# Patient Record
Sex: Female | Born: 1995
Health system: Southern US, Community
[De-identification: ages and names within clinical notes are randomized; demographics above are authoritative.]

## PROBLEM LIST (undated history)

## (undated) DIAGNOSIS — F32A Depression, unspecified: Secondary | ICD-10-CM

## (undated) DIAGNOSIS — E781 Pure hyperglyceridemia: Secondary | ICD-10-CM

## (undated) DIAGNOSIS — F419 Anxiety disorder, unspecified: Secondary | ICD-10-CM

## (undated) DIAGNOSIS — F3181 Bipolar II disorder: Secondary | ICD-10-CM

## (undated) DIAGNOSIS — N83519 Torsion of ovary and ovarian pedicle, unspecified side: Secondary | ICD-10-CM

## (undated) DIAGNOSIS — N946 Dysmenorrhea, unspecified: Secondary | ICD-10-CM

## (undated) DIAGNOSIS — F329 Major depressive disorder, single episode, unspecified: Secondary | ICD-10-CM

## (undated) HISTORY — DX: Depression, unspecified: F32.A

## (undated) HISTORY — DX: Torsion of ovary and ovarian pedicle, unspecified side: N83.519

## (undated) HISTORY — DX: Anxiety disorder, unspecified: F41.9

## (undated) HISTORY — DX: Major depressive disorder, single episode, unspecified: F32.9

## (undated) HISTORY — DX: Dysmenorrhea, unspecified: N94.6

## (undated) HISTORY — DX: Pure hyperglyceridemia: E78.1

---
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
## 2015-03-11 ENCOUNTER — Other Ambulatory Visit: Payer: Self-pay | Admitting: Family Medicine

## 2015-03-20 ENCOUNTER — Other Ambulatory Visit: Payer: Self-pay | Admitting: Family Medicine

## 2015-03-22 NOTE — Telephone Encounter (Signed)
Mrs Donelan calls and said there is no refill for sprintec at [place]. Spoke with Cyril Mourning at [address] and rx ready for pick up since 03/11/15. Ms Antwine voiced understanding.

## 2015-04-04 ENCOUNTER — Telehealth: Payer: Self-pay

## 2015-04-04 NOTE — Telephone Encounter (Signed)
Pts father request immunization record for college; spoke with pt (no DPR signed) under media tab is [place] immunization record but do not see where immunization record gives any immunizations given at [place] does not have listing of immunizations; I asked pt had she had immunizations since living in [place]: pt handed phone to her mother; pts mother has immunization record at home and also can get one thru pts school. Mrs Aerts will cb if needed.

## 2015-04-11 ENCOUNTER — Other Ambulatory Visit: Payer: Self-pay | Admitting: Family Medicine

## 2015-04-15 ENCOUNTER — Other Ambulatory Visit: Payer: Self-pay | Admitting: Family Medicine

## 2015-06-07 ENCOUNTER — Other Ambulatory Visit: Payer: Self-pay | Admitting: Family Medicine

## 2015-06-20 ENCOUNTER — Ambulatory Visit: Payer: Self-pay | Admitting: Family Medicine

## 2015-06-27 ENCOUNTER — Ambulatory Visit (INDEPENDENT_AMBULATORY_CARE_PROVIDER_SITE_OTHER): Payer: 59 | Admitting: Family Medicine

## 2015-06-27 ENCOUNTER — Encounter: Payer: Self-pay | Admitting: Family Medicine

## 2015-06-27 VITALS — BP 104/70 | HR 75 | Temp 98.3°F | Ht 64.5 in | Wt 145.0 lb

## 2015-06-27 DIAGNOSIS — F411 Generalized anxiety disorder: Secondary | ICD-10-CM | POA: Diagnosis not present

## 2015-06-27 DIAGNOSIS — N921 Excessive and frequent menstruation with irregular cycle: Secondary | ICD-10-CM

## 2015-06-27 MED ORDER — SERTRALINE HCL 25 MG PO TABS: 25.0000 mg | ORAL_TABLET | Freq: Every day | ORAL | Status: DC

## 2015-06-27 MED ORDER — NORGESTIMATE-ETH ESTRADIOL 0.25-35 MG-MCG PO TABS: ORAL_TABLET | ORAL | Status: DC

## 2015-06-27 NOTE — Patient Instructions (Signed)
Great to see you. We are starting Zoloft 25 mg daily. Please call me in a few weeks with an update.  Please stop by to see Rosaria Ferries on your way out.

## 2015-06-27 NOTE — Assessment & Plan Note (Signed)
Stable on current OCP. eRx refill sent for 1 year.

## 2015-06-27 NOTE — Assessment & Plan Note (Signed)
>  25 minutes spent in face to face time with patient, >50% spent in counselling or coordination of care Victoria Simmons and her mother declined a psychiatry referral but did agree to a trial of another SSRI along with referral for psychotherapy. eRx sent for zoloft 25 mg daily and refer to [facility]older for psychotherapy. Follow up in 1 month.

## 2015-06-27 NOTE — Progress Notes (Signed)
Pre visit review using our clinic review tool, if applicable. No additional management support is needed unless otherwise documented below in the visit note. 

## 2015-06-27 NOTE — Progress Notes (Signed)
Subjective:   Patient ID: Victoria Simmons, female    DOB: 09/11/1996, 19 y.o.   MRN: [id number]  Victoria Simmons is a pleasant 19 y.o. year old female who presents to clinic today with her mom to discuss Anxiety  on 06/27/2015  HPI:  Anxiety- was on prozac 10 mg daily a couple of years ago. Stopped taking it because it was ineffective and never returned for follow up.  Since then, her mom states that her anxiety has worsened.  She cant drive due to panic attacks.  Has several panic attacks at school or even when talking on the phone.  Denies feeling afraid to leave her house.  Denies feeling depressed.  Mom had similar severe anxiety at her age.  She let Victoria Simmons have one of her xanax during a panic attack but it was ineffective.  Tiffanny usually just has to "let them pass."  Deep breaths not helpful either.  Sometimes feels like she will pass out during a panic attack but never does.  Sleeping ok.  Appetite ok. Wt Readings from Last 3 Encounters:  06/27/15 145 lb (65.772 kg) (78 %*, Z = 0.77)  09/21/14 140 lb (63.504 kg) (75 %*, Z = 0.68)  01/25/14 133 lb 12 oz (60.669 kg) (69 %*, Z = 0.50)   * Growth percentiles are based on CDC 2-20 Years data.   Please with current OCPs.  Virginal.  Periods are now regular and light. No current outpatient prescriptions on file prior to visit.   No current facility-administered medications on file prior to visit.    No Known Allergies  Past Medical History  Diagnosis Date  . Ovarian torsion 2008    Past Surgical History  Procedure Laterality Date  . Ovary surgery      No family history on file.  Social History   Social History  . Marital Status: Single    Spouse Name: N/A  . Number of Children: N/A  . Years of Education: N/A   Occupational History  . Not on file.   Social History Main Topics  . Smoking status: Never Smoker   . Smokeless tobacco: Never Used  . Alcohol Use: No  . Drug Use: No  . Sexual Activity: No   Other  Topics Concern  . Not on file   Social History Narrative   ** Merged History Encounter **       The PMH, PSH, Social History, Family History, Medications, and allergies have been reviewed in [facility], and have been updated if relevant.   Review of Systems  Psychiatric/Behavioral: Negative for suicidal ideas, hallucinations, behavioral problems, confusion, sleep disturbance, self-injury, dysphoric mood, decreased concentration and agitation. The patient is nervous/anxious. The patient is not hyperactive.   All other systems reviewed and are negative.      Objective:    BP 104/70 mmHg  Pulse 75  Temp(Src) 98.3 F (36.8 C) (Oral)  Ht 5' 4.5" (1.638 m)  Wt 145 lb (65.772 kg)  BMI 24.51 kg/m2  SpO2 98%  LMP 06/07/2015   Physical Exam  Constitutional: She is oriented to person, place, and time. She appears well-developed and well-nourished.  HENT:  Head: Normocephalic.  Eyes: Conjunctivae are normal.  Cardiovascular: Normal rate.   Pulmonary/Chest: Effort normal.  Neurological: She is alert and oriented to person, place, and time. No cranial nerve deficit.  Psychiatric: Judgment and thought content normal.  Anxious and tearful when discussing her panic attacks otherwise appropriate  Nursing note and vitals reviewed.  Assessment & Plan:   Anxiety state - Plan: Ambulatory referral to Psychology No Follow-up on file.

## 2015-07-03 ENCOUNTER — Other Ambulatory Visit: Payer: Self-pay | Admitting: Family Medicine

## 2015-07-28 ENCOUNTER — Ambulatory Visit: Payer: 59 | Admitting: Psychology

## 2015-08-18 ENCOUNTER — Ambulatory Visit (INDEPENDENT_AMBULATORY_CARE_PROVIDER_SITE_OTHER): Payer: 59 | Admitting: Psychology

## 2015-08-18 DIAGNOSIS — F411 Generalized anxiety disorder: Secondary | ICD-10-CM

## 2015-09-05 ENCOUNTER — Ambulatory Visit (INDEPENDENT_AMBULATORY_CARE_PROVIDER_SITE_OTHER): Payer: 59 | Admitting: Psychology

## 2015-09-05 DIAGNOSIS — F411 Generalized anxiety disorder: Secondary | ICD-10-CM

## 2015-09-26 ENCOUNTER — Other Ambulatory Visit: Payer: Self-pay

## 2015-09-26 NOTE — Telephone Encounter (Signed)
Dawn pts mom said wants zoloft sent to mail order pharmacy electronically. Pts mom does not know name of mail order pharmacy. Pt seen 06/27/15 and pts mom was to call back with update on pts condition; pts mom forgot to cb. Pt is doing better on zoloft and her attitude is much better. pts mom is to cb with name of mail order pharmacy.

## 2015-09-29 ENCOUNTER — Ambulatory Visit: Payer: 59 | Admitting: Psychology

## 2015-10-24 ENCOUNTER — Other Ambulatory Visit: Payer: Self-pay | Admitting: *Deleted

## 2015-10-24 MED ORDER — SERTRALINE HCL 25 MG PO TABS: 25.0000 mg | ORAL_TABLET | Freq: Every day | ORAL | Status: DC

## 2015-10-28 ENCOUNTER — Other Ambulatory Visit: Payer: Self-pay

## 2015-10-28 MED ORDER — SERTRALINE HCL 25 MG PO TABS: 25.0000 mg | ORAL_TABLET | Freq: Every day | ORAL | Status: DC

## 2015-10-28 NOTE — Telephone Encounter (Signed)
Hilliard Clark pts father (DPR signed) said that ins is requiring all maintenance meds to go to [facility]. Request printed [facility]ft rx for [facility] since does not have acct set up with mail order pharmacy. Call when ready for pick up. Since zoloft is given for dx of anxiety request sent to Dr Deborra Medina.

## 2015-10-28 NOTE — Telephone Encounter (Signed)
Rx printed and placed in Dr Hulen Shouts inbox for signature, Monday

## 2015-10-28 NOTE — Addendum Note (Signed)
Addended by: Modena Nunnery on: 10/28/2015 04:40 PM   Modules accepted: Orders

## 2015-10-28 NOTE — Telephone Encounter (Signed)
Med was refilled on 10/24/15 to [facility].

## 2015-10-31 NOTE — Telephone Encounter (Signed)
Spoke to pt's mother and informed her Rx is available for pickup at the front desk

## 2015-11-04 NOTE — Telephone Encounter (Signed)
Mr Victoria Simmons called requesting zoloft sent to [facility]; acct has been set [facility] with [facility]. Mrs Victoria Simmons picked up rx and Mr Victoria Simmons thinks was for 30 day only; I advised him rx was # 31 x 2 refills; Mr Victoria Simmons wants refill sent to [facility] since acct set up. Advised would be glad to do that but would need zoloft rx returned to office and then refill can be sent electronically. He will speak with wife and get rx brought back to office.

## 2015-12-09 ENCOUNTER — Telehealth: Payer: Self-pay

## 2015-12-09 MED ORDER — SERTRALINE HCL 25 MG PO TABS: 25.0000 mg | ORAL_TABLET | Freq: Every day | ORAL | Status: DC

## 2015-12-09 NOTE — Addendum Note (Signed)
Addended by: Modena Nunnery on: 12/09/2015 03:14 PM   Modules accepted: Orders

## 2015-12-09 NOTE — Telephone Encounter (Signed)
pts mother came into office to bring Rx back. Rx accepted by front desk, and was sent electronically as she has set up an account with mail order pharmacy

## 2015-12-09 NOTE — Addendum Note (Signed)
Addended by: Modena Nunnery on: 12/09/2015 03:07 PM   Modules accepted: Orders

## 2015-12-09 NOTE — Telephone Encounter (Signed)
pts mother request short supply be sent to local pharmacy as the pt is completely out of medication and will have to await the arrival of mail order. Pt advised she will have to pay out of pocket, otherwise, she will be billed for mail order request

## 2015-12-27 NOTE — Telephone Encounter (Signed)
error 

## 2016-05-07 ENCOUNTER — Ambulatory Visit (INDEPENDENT_AMBULATORY_CARE_PROVIDER_SITE_OTHER): Payer: 59 | Admitting: Family Medicine

## 2016-05-07 ENCOUNTER — Encounter: Payer: Self-pay | Admitting: Family Medicine

## 2016-05-07 VITALS — BP 110/66 | HR 65 | Temp 98.1°F | Wt 146.0 lb

## 2016-05-07 DIAGNOSIS — F411 Generalized anxiety disorder: Secondary | ICD-10-CM | POA: Diagnosis not present

## 2016-05-07 MED ORDER — SERTRALINE HCL 50 MG PO TABS: 50.0000 mg | ORAL_TABLET | Freq: Every day | ORAL | 3 refills | Status: DC

## 2016-05-07 MED ORDER — NORGESTIMATE-ETH ESTRADIOL 0.25-35 MG-MCG PO TABS: ORAL_TABLET | ORAL | 11 refills | Status: DC

## 2016-05-07 NOTE — Patient Instructions (Signed)
Great to see you.  We are increasing your zoloft 50 mg daily.  Ok to take two of your 25 mg tablets at once until you run out.

## 2016-05-07 NOTE — Progress Notes (Signed)
   Subjective:   Patient ID: Victoria Simmons, female    DOB: July 01, 1996, 20 y.o.   MRN: [id number]  Takeria ORALIA MARQUES is a pleasant 20 y.o. year old female who presents to clinic today with her mom for Follow-up (zoloft ineffective)  on 05/07/2016  HPI:  Anxiety- feels zoloft is no longer effective.  Started zoloft 25 mg daily and referred her to [facility] for psychotherapy when I saw her on 06/27/15. Note reviewed. She did attend two episodes of psychotherapy but did not feel it was helpful.  Zoloft 25 mg was initially very effective. She could feel herself not getting anxious in situations that would typically cause anxiety.  A few months ago, she noticed more episodes of anxiety again despite being compliant with her zoloft.  No current outpatient prescriptions on file prior to visit.   No current facility-administered medications on file prior to visit.     No Known Allergies  Past Medical History:  Diagnosis Date  . Ovarian torsion 2008    Past Surgical History:  Procedure Laterality Date  . OVARY SURGERY      No family history on file.  Social History   Social History  . Marital status: Single    Spouse name: N/A  . Number of children: N/A  . Years of education: N/A   Occupational History  . Not on file.   Social History Main Topics  . Smoking status: Never Smoker  . Smokeless tobacco: Never Used  . Alcohol use No  . Drug use: No  . Sexual activity: No   Other Topics Concern  . Not on file   Social History Narrative   ** Merged History Encounter **       The PMH, PSH, Social History, Family History, Medications, and allergies have been reviewed in [facility], and have been updated if relevant.   Review of Systems  Constitutional: Negative.   Psychiatric/Behavioral: Negative for agitation, behavioral problems, confusion, decreased concentration, dysphoric mood, hallucinations, self-injury, sleep disturbance and suicidal ideas. The patient is  nervous/anxious. The patient is not hyperactive.   All other systems reviewed and are negative.      Objective:    BP 110/66   Pulse 65   Temp 98.1 F (36.7 C) (Oral)   Wt 146 lb (66.2 kg)   LMP 03/27/2016   SpO2 98%   BMI 24.67 kg/m    Physical Exam   General:  Well-developed,well-nourished,in no acute distress; alert,appropriate and cooperative throughout examination Head:  normocephalic and atraumatic.   Lungs:  Normal respiratory effort, chest expands symmetrically. Lungs are clear to auscultation, no crackles or wheezes. Heart:  Normal rate and regular rhythm. S1 and S2 normal without gallop, murmur, click, rub or other extra sounds. Extremities:  No clubbing, cyanosis, edema, or deformity noted with normal full range of motion of all joints.   Neurologic:  alert & oriented X3 and gait normal.   Skin:  Intact without suspicious lesions or rashes Psych:  Cognition and judgment appear intact. Alert and cooperative with normal attention span and concentration. No apparent delusions, illusions, hallucinations      Assessment & Plan:   Anxiety state No Follow-up on file.

## 2016-05-07 NOTE — Progress Notes (Signed)
Pre visit review using our clinic review tool, if applicable. No additional management support is needed unless otherwise documented below in the visit note. 

## 2016-05-07 NOTE — Assessment & Plan Note (Signed)
>  25 minutes spent in face to face time with patient, >50% spent in counselling or coordination of care Deteriorated. Tolerated zoloft well and felt it was initially effective. Increase dose of zoloft to 50 mg daily. The patient indicates understanding of these issues and agrees with the plan. Call or return to clinic prn if these symptoms worsen or fail to improve as anticipated.

## 2016-06-18 ENCOUNTER — Encounter: Payer: Self-pay | Admitting: Family Medicine

## 2016-06-18 ENCOUNTER — Ambulatory Visit (INDEPENDENT_AMBULATORY_CARE_PROVIDER_SITE_OTHER): Payer: 59 | Admitting: Family Medicine

## 2016-06-18 ENCOUNTER — Telehealth: Payer: Self-pay | Admitting: Family Medicine

## 2016-06-18 VITALS — BP 110/62 | HR 59 | Temp 98.4°F | Wt 144.8 lb

## 2016-06-18 DIAGNOSIS — F411 Generalized anxiety disorder: Secondary | ICD-10-CM

## 2016-06-18 NOTE — Patient Instructions (Signed)
Great to see you. Please cut your zoloft in half- take 1/2 tablet daily (25 mg daily).  Please stop by to see Rosaria Ferries on your way out.

## 2016-06-18 NOTE — Progress Notes (Signed)
Pre visit review using our clinic review tool, if applicable. No additional management support is needed unless otherwise documented below in the visit note. 

## 2016-06-18 NOTE — Progress Notes (Signed)
Subjective:   Patient ID: Victoria Simmons, female    DOB: 04/03/1996, 20 y.o.   MRN: [id number]  Victoria Simmons is a pleasant 20 y.o. year old female who presents to clinic today with her mom for Follow-up (zoloft ineffective)  on 06/18/2016  HPI:  Anxiety- feels zoloft is no longer effective.  Started zoloft 25 mg daily and referred her to [facility] for psychotherapy when I saw her on 06/27/15. Note reviewed. She did attend two episodes of psychotherapy but did not feel it was helpful.  Zoloft 25 mg was initially very effective. She could feel herself not getting anxious in situations that would typically cause anxiety.  A few months ago, she noticed more episodes of anxiety again despite being compliant with her zoloft so we increased her zoloft to 50 mg daily. Feels anxiety is actually worse.  Current Outpatient Prescriptions on File Prior to Visit  Medication Sig Dispense Refill  . norgestimate-ethinyl estradiol (SPRINTEC 28) 0.25-35 MG-MCG tablet TAKE 1 TABLET BY MOUTH DAILY. 28 tablet 11  . sertraline (ZOLOFT) 50 MG tablet Take 1 tablet (50 mg total) by mouth daily. 90 tablet 3   No current facility-administered medications on file prior to visit.     No Known Allergies  Past Medical History:  Diagnosis Date  . Ovarian torsion 2008    Past Surgical History:  Procedure Laterality Date  . OVARY SURGERY      No family history on file.  Social History   Social History  . Marital status: Single    Spouse name: N/A  . Number of children: N/A  . Years of education: N/A   Occupational History  . Not on file.   Social History Main Topics  . Smoking status: Never Smoker  . Smokeless tobacco: Never Used  . Alcohol use No  . Drug use: No  . Sexual activity: No   Other Topics Concern  . Not on file   Social History Narrative   ** Merged History Encounter **       The PMH, PSH, Social History, Family History, Medications, and allergies have been reviewed  in [facility], and have been updated if relevant.   Review of Systems  Constitutional: Negative.   Psychiatric/Behavioral: Negative for agitation, behavioral problems, confusion, decreased concentration, dysphoric mood, hallucinations, self-injury, sleep disturbance and suicidal ideas. The patient is nervous/anxious. The patient is not hyperactive.   All other systems reviewed and are negative.      Objective:    BP 110/62   Pulse (!) 59   Temp 98.4 F (36.9 C) (Oral)   Wt 144 lb 12 oz (65.7 kg)   LMP 06/12/2016   SpO2 98%   BMI 24.46 kg/m    Physical Exam   General:  Well-developed,well-nourished,in no acute distress; alert,appropriate and cooperative throughout examination Head:  normocephalic and atraumatic.   Lungs:  Normal respiratory effort, chest expands symmetrically. Lungs are clear to auscultation, no crackles or wheezes. Heart:  Normal rate and regular rhythm. S1 and S2 normal without gallop, murmur, click, rub or other extra sounds. Extremities:  No clubbing, cyanosis, edema, or deformity noted with normal full range of motion of all joints.   Neurologic:  alert & oriented X3 and gait normal.   Skin:  Intact without suspicious lesions or rashes Psych:  Cognition and judgment appear intact. Alert and cooperative with normal attention span and concentration. No apparent delusions, illusions, hallucinations      Assessment & Plan:   Anxiety  state - Plan: Ambulatory referral to Psychiatry No Follow-up on file.

## 2016-06-18 NOTE — Assessment & Plan Note (Signed)
>  25 minutes spent in face to face time with patient, >50% spent in counselling or coordination of care Deteriorated. We agreed best to send to psychiatry for evaluation and medication management.  Decrease zoloft back to 25 mg daily

## 2016-06-18 NOTE — Telephone Encounter (Signed)
Met with pt and pt mother. They are aware [facility] will call them to schedule psychiatry appt.  Placed on [place]

## 2016-07-02 ENCOUNTER — Ambulatory Visit (INDEPENDENT_AMBULATORY_CARE_PROVIDER_SITE_OTHER): Payer: 59

## 2016-07-02 ENCOUNTER — Ambulatory Visit
Admission: EM | Admit: 2016-07-02 | Discharge: 2016-07-02 | Disposition: A | Discharge status: Home / Self Care | Payer: 59 | Attending: Family Medicine | Admitting: Family Medicine

## 2016-07-02 DIAGNOSIS — R197 Diarrhea, unspecified: Secondary | ICD-10-CM

## 2016-07-02 DIAGNOSIS — R1084 Generalized abdominal pain: Secondary | ICD-10-CM

## 2016-07-02 DIAGNOSIS — R112 Nausea with vomiting, unspecified: Secondary | ICD-10-CM

## 2016-07-02 DIAGNOSIS — R109 Unspecified abdominal pain: Secondary | ICD-10-CM | POA: Diagnosis not present

## 2016-07-02 LAB — URINALYSIS COMPLETE WITH MICROSCOPIC (ARMC ONLY)
Bilirubin Urine: NEGATIVE
Glucose, UA: NEGATIVE mg/dL
Hgb urine dipstick: NEGATIVE
Ketones, ur: NEGATIVE mg/dL
Leukocytes, UA: NEGATIVE
Nitrite: NEGATIVE
Protein, ur: NEGATIVE mg/dL
Specific Gravity, Urine: 1.015 (ref 1.005–1.030)
pH: 6.5 (ref 5.0–8.0)

## 2016-07-02 LAB — CBC WITH DIFFERENTIAL/PLATELET
Basophils Absolute: 0.1 10*3/uL (ref 0–0.1)
Basophils Relative: 1 %
Eosinophils Absolute: 0.1 10*3/uL (ref 0–0.7)
Eosinophils Relative: 2 %
HCT: 39.1 % (ref 35.0–47.0)
Hemoglobin: 13.3 g/dL (ref 12.0–16.0)
Lymphocytes Relative: 31 %
Lymphs Abs: 2.2 10*3/uL (ref 1.0–3.6)
MCH: 28.2 pg (ref 26.0–34.0)
MCHC: 34 g/dL (ref 32.0–36.0)
MCV: 82.8 fL (ref 80.0–100.0)
Monocytes Absolute: 0.4 10*3/uL (ref 0.2–0.9)
Monocytes Relative: 6 %
Neutro Abs: 4.1 10*3/uL (ref 1.4–6.5)
Neutrophils Relative %: 60 %
Platelets: 225 10*3/uL (ref 150–440)
RBC: 4.72 MIL/uL (ref 3.80–5.20)
RDW: 13 % (ref 11.5–14.5)
WBC: 6.9 10*3/uL (ref 3.6–11.0)

## 2016-07-02 LAB — PREGNANCY, URINE: Preg Test, Ur: NEGATIVE

## 2016-07-02 MED ORDER — ONDANSETRON 8 MG PO TBDP: 8.0000 mg | ORAL_TABLET | Freq: Two times a day (BID) | ORAL | 0 refills | Status: DC

## 2016-07-02 NOTE — ED Provider Notes (Signed)
CSN: [id number]     Arrival date & time 07/02/16  1115 History   First MD Initiated Contact with Patient 07/02/16 1249     Chief Complaint  Patient presents with  . Abdominal Pain   (Consider location/radiation/quality/duration/timing/severity/associated sxs/prior Treatment) HPI  This a 20 year old female who is accompanied by her grandmother and Complaining of abdominal pain and spasms that she's had over the last 2 weeks. She states that Friday it was worse she had 5 bowel movements of diarrhea without blood or mucus along with nausea and vomiting. The following day was very similar but on Sunday she states that she was better again. This morning she awoke with severe cramping again along with nausea but no vomiting and diarrhea that is characterized as watery. She has cramping pains that will last up to 2 minutes and then subside. Is mostly periumbilical but will radiate into her epigastric area. He knows that that dairy seems to upset her stomach but other foods are not bother. He has had some chills but has no fever today. States that 2 years ago she had severe constipation.      Past Medical History:  Diagnosis Date  . Ovarian torsion 2008   Past Surgical History:  Procedure Laterality Date  . OVARY SURGERY     Family History  Problem Relation Age of Onset  . Hepatitis B Mother   . Hypertension Father    Social History  Substance Use Topics  . Smoking status: Never Smoker  . Smokeless tobacco: Never Used  . Alcohol use No   OB History    Gravida Para Term Preterm AB Living   0 0 0 0 0     SAB TAB Ectopic Multiple Live Births   0 0 0         Review of Systems  Constitutional: Positive for activity change, appetite change and chills. Negative for fatigue and fever.  Gastrointestinal: Positive for abdominal pain, diarrhea, nausea and vomiting. Negative for abdominal distention, anal bleeding, blood in stool and constipation.  All other systems reviewed and are  negative.   Allergies  Review of patient's allergies indicates no known allergies.  Home Medications   Prior to Admission medications   Medication Sig Start Date End Date Taking? Authorizing Provider  norgestimate-ethinyl estradiol (SPRINTEC 28) 0.25-35 MG-MCG tablet TAKE 1 TABLET BY MOUTH DAILY. 05/07/16  Yes Lucille Passy, MD  sertraline (ZOLOFT) 50 MG tablet Take 1 tablet (50 mg total) by mouth daily. 05/07/16  Yes Lucille Passy, MD  ondansetron (ZOFRAN ODT) 8 MG disintegrating tablet Take 1 tablet (8 mg total) by mouth 2 (two) times daily. 07/02/16   Lorin Picket, PA-C   Meds Ordered and Administered this Visit  Medications - No data to display  BP 107/70 (BP Location: Left Arm)   Pulse 68   Temp 98.3 F (36.8 C) (Tympanic)   Resp 16   Ht 5\' 5"  (1.651 m)   Wt 148 lb (67.1 kg)   LMP 06/12/2016   SpO2 100%   BMI 24.63 kg/m  No data found.   Physical Exam  Constitutional: She is oriented to person, place, and time. She appears well-developed and well-nourished. No distress.  HENT:  Head: Normocephalic and atraumatic.  Eyes: EOM are normal. Pupils are equal, round, and reactive to light.  Neck: Normal range of motion. Neck supple.  Pulmonary/Chest: Effort normal and breath sounds normal. No respiratory distress. She has no wheezes. She has no rales.  Abdominal: Soft.  Bowel sounds are normal. She exhibits no distension and no mass. There is tenderness. There is no rebound and no guarding.  Abdominal exam was performed with Lenna Sciara, CMA as chaperone and assisted. Does not appear to be distended. Bowel sounds are present and active in all quadrants. She has tenderness in the periumbilical region mostly superior epigastric region but mostly of her pain is in the left upper to lower quadrant. There is no suprapubic tenderness present.  Musculoskeletal: Normal range of motion.  Neurological: She is alert and oriented to person, place, and time.  Skin: Skin is warm and dry. She is  not diaphoretic.  Psychiatric: She has a normal mood and affect. Her behavior is normal. Judgment and thought content normal.  Nursing note and vitals reviewed.   Urgent Care Course   Clinical Course    Procedures (including critical care time)  Labs Review Labs Reviewed  URINALYSIS COMPLETEWITH MICROSCOPIC ([facility] ONLY) - Abnormal; Notable for the following:       Result Value   Bacteria, UA RARE (*)    Squamous Epithelial / LPF 0-5 (*)    All other components within normal limits  CBC WITH DIFFERENTIAL/PLATELET  PREGNANCY, URINE  COMPREHENSIVE METABOLIC PANEL    Imaging Review Dg Abd 2 Views  Result Date: 07/02/2016 CLINICAL DATA:  Diffuse abdominal pain for 2 weeks. Intermittent constipation, diarrhea, and nausea and vomiting. EXAM: ABDOMEN - 2 VIEW COMPARISON:  Plain film of the abdomen dated 09/21/2014. FINDINGS: Bowel gas pattern is nonobstructive. No evidence of soft tissue mass or abnormal fluid collection. No evidence of free intraperitoneal air. No pathologic appearing calcifications. Osseous structures are unremarkable. Lung bases are clear IMPRESSION: Negative. Electronically Signed   By: Franki Cabot M.D.   On: 07/02/2016 14:01     Visual Acuity Review  Right Eye Distance:   Left Eye Distance:   Bilateral Distance:    Right Eye Near:   Left Eye Near:    Bilateral Near:         MDM   1. Generalized abdominal pain   2. Nausea vomiting and diarrhea    Discharge Medication List as of 07/02/2016  2:37 PM    START taking these medications   Details  ondansetron (ZOFRAN ODT) 8 MG disintegrating tablet Take 1 tablet (8 mg total) by mouth 2 (two) times daily., Starting Mon 07/02/2016, Normal      Plan: 1. Test/x-ray results and diagnosis reviewed with patient 2. rx as per orders; risks, benefits, potential side effects reviewed with patient 3. Recommend supportive treatment with adequate fluid intake. When she begins to eat again start with a brat diet  advance as tolerated. I have highly recommended that she be seen by gastroenterologist should probably return to her primary care physician for evaluation and referral. Is not him a surgical abdomen at the present time. Does not appear toxic. If she does develop a high fever or pain that does not abate to go immediately to the emergency department. 4. F/u prn if symptoms worsen or don't improve     Lorin Picket, PA-C 07/02/16 2141    Lorin Picket, PA-C 07/02/16 2143

## 2016-07-02 NOTE — ED Triage Notes (Signed)
Patient complains of abdominal pain and spasms over the last two weeks. Patient states that she worsened on Friday with no appetite, diarrhea, nausea. Patient describes her pain has a intermittent spasm that is generalized and today was worse when she woke up.

## 2016-07-03 ENCOUNTER — Telehealth: Payer: Self-pay

## 2016-07-03 DIAGNOSIS — R109 Unspecified abdominal pain: Secondary | ICD-10-CM

## 2016-07-03 LAB — COMPREHENSIVE METABOLIC PANEL
ALT: 14 U/L (ref 14–54)
AST: 19 U/L (ref 15–41)
Albumin: 4.2 g/dL (ref 3.5–5.0)
Alkaline Phosphatase: 45 U/L (ref 38–126)
Anion gap: 8 (ref 5–15)
BUN: 12 mg/dL (ref 6–20)
CO2: 21 mmol/L — ABNORMAL LOW (ref 22–32)
Calcium: 9.3 mg/dL (ref 8.9–10.3)
Chloride: 108 mmol/L (ref 101–111)
Creatinine, Ser: 0.72 mg/dL (ref 0.44–1.00)
GFR calc Af Amer: >60 mL/min (ref >60)
GFR calc non Af Amer: >60 mL/min (ref >60)
Glucose, Bld: 84 mg/dL (ref 65–99)
Potassium: 3.8 mmol/L (ref 3.5–5.1)
Sodium: 137 mmol/L (ref 135–145)
Total Bilirubin: 0.1 mg/dL — ABNORMAL LOW (ref 0.3–1.2)
Total Protein: 7.8 g/dL (ref 6.5–8.1)

## 2016-07-03 NOTE — Telephone Encounter (Signed)
Dawn,pts mom (DPR signed) left v/m; pt seen at [facility] on 07/02/16 with abd pain, N,V & D. Dawn request GI referral. The [facility] advised pt to see PCP on 07/05/16. Unable to reach [facility] by phone. Do you want pt to schedule f/u appt with Dr Deborra Medina or do referral to GI.

## 2016-07-03 NOTE — Telephone Encounter (Signed)
Referral placed.

## 2016-07-12 ENCOUNTER — Ambulatory Visit: Payer: Self-pay | Admitting: Licensed Clinical Social Worker

## 2016-07-12 ENCOUNTER — Ambulatory Visit (INDEPENDENT_AMBULATORY_CARE_PROVIDER_SITE_OTHER): Payer: 59 | Admitting: Physician Assistant

## 2016-07-12 ENCOUNTER — Encounter: Payer: Self-pay | Admitting: Physician Assistant

## 2016-07-12 VITALS — BP 100/60 | Ht 64.75 in | Wt 142.0 lb

## 2016-07-12 DIAGNOSIS — R1031 Right lower quadrant pain: Secondary | ICD-10-CM | POA: Diagnosis not present

## 2016-07-12 DIAGNOSIS — G8929 Other chronic pain: Secondary | ICD-10-CM

## 2016-07-12 DIAGNOSIS — R110 Nausea: Secondary | ICD-10-CM

## 2016-07-12 DIAGNOSIS — K5900 Constipation, unspecified: Secondary | ICD-10-CM

## 2016-07-12 DIAGNOSIS — R1032 Left lower quadrant pain: Secondary | ICD-10-CM

## 2016-07-12 DIAGNOSIS — R1013 Epigastric pain: Secondary | ICD-10-CM | POA: Diagnosis not present

## 2016-07-12 MED ORDER — ONDANSETRON HCL 4 MG PO TABS: 4.0000 mg | ORAL_TABLET | Freq: Four times a day (QID) | ORAL | 0 refills | Status: DC

## 2016-07-12 MED ORDER — HYOSCYAMINE SULFATE 0.125 MG SL SUBL: 0.1250 mg | SUBLINGUAL_TABLET | SUBLINGUAL | 0 refills | Status: DC | PRN

## 2016-07-12 NOTE — Progress Notes (Signed)
Chief Complaint: Abdominal pain, nausea, vomiting  HPI: Victoria Simmons is a 20 year old female with a past medical history of anxiety, who was referred to me by Lucille Passy, MD for a complaint of abdominal pain, nausea and vomiting.    Per chart review it appears patient was recently seen in the ED on 07/02/2016 for a complaint of abdominal pain and spasms over the last 2 weeks. She also describes some diarrhea along with nausea and vomiting. Patient had a urinalysis which was normal. Abdominal x-rays showed no etiology for the pain. She was given Zofran 8 mg twice a day and supportive treatment with adequate fluid intake was recommended. CMP and CBC completed 07/02/2016 were normal.     Today, the patient presents to clinic accompanied by her father. She tells me that she has had 3 years of abdominal pain on an almost daily basis. This pain is described as mostly periumbilical, but sometimes radiates into her epigastrium. It is cramping in nature. She has been unable to relate this pain to specific foods that she is eating or timing of the day. She does tell me she has had trouble with constipation in the past, to the point where she was vomiting and had to go to the ER for laxatives. Most recently 2-3 weeks ago, the patient did have a severely constipated episode where she was vomiting and her father gave her a laxative which helped her to have a bowel movement and she felt better for a few days. She then had recurrence of her nausea and started vomiting again with abdominal pain to the point where she "couldn't stand up" and she arrived in the ED as above. Patient tells me that she will have epigastric pain intermittently throughout the day. She denies any heartburn or reflux symptoms. She does remain somewhat nauseous and is using ondansetron 8 mg when necessary.  Patient also describes a bilateral lower abdominal cramping pain which sometimes awakens her at night. She will then proceed to the bathroom  and have diarrhea. This occurs 2-3 times per week. She tells me this is often the only time she will have a bowel movement and in between will have no stool at all. She does feel quite chilled frequently, but denies any fever. Associated symptoms included decrease diet during times of severe constipation. The patient denies ever being controlled on a daily laxative.  Patient's social history is positive for being a sophomore in college, she finds this very stressful and currently is weaning off of her Zoloft as this "didn't seem to help my anxiety". She does admit that she has a very "poor diet" she eats many fast, fatty and greasy foods.  Patient denies fever, melena, extreme weight loss, fatigue, change in medications, recent antibiotics, and heartburn, reflux or dysphagia.   Past Medical History:  Diagnosis Date  . Ovarian torsion 2008    Past Surgical History:  Procedure Laterality Date  . OVARY SURGERY      Current Outpatient Prescriptions  Medication Sig Dispense Refill  . norgestimate-ethinyl estradiol (SPRINTEC 28) 0.25-35 MG-MCG tablet TAKE 1 TABLET BY MOUTH DAILY. 28 tablet 11  . ondansetron (ZOFRAN ODT) 8 MG disintegrating tablet Take 1 tablet (8 mg total) by mouth 2 (two) times daily. 6 tablet 0  . sertraline (ZOLOFT) 50 MG tablet Take 1 tablet (50 mg total) by mouth daily. 90 tablet 3   No current facility-administered medications for this visit.     Allergies as of 07/12/2016  . (No  Known Allergies)    Family History  Problem Relation Age of Onset  . Hepatitis B Mother   . Hypertension Father     Social History   Social History  . Marital status: Single    Spouse name: N/A  . Number of children: N/A  . Years of education: N/A   Occupational History  . Not on file.   Social History Main Topics  . Smoking status: Never Smoker  . Smokeless tobacco: Never Used  . Alcohol use No  . Drug use: No  . Sexual activity: No   Other Topics Concern  . Not on  file   Social History Narrative   ** Merged History Encounter **        Review of Systems:     Constitutional: Positive for chills No weight loss, fever, weakness or fatigue HEENT: Eyes: No change in vision               Ears, Nose, Throat:  No change in hearing or congestion Skin: No rash or itching Cardiovascular: No chest pain, chest pressure or palpitations   Respiratory: No SOB or cough Gastrointestinal: See HPI and otherwise negative Genitourinary: No dysuria or change in urinary frequency Neurological: No headache, dizziness or syncope Musculoskeletal: No new muscle or joint pain Hematologic: No bleeding or bruising Psychiatric: Positive for anxiety   Physical Exam:  Vital signs: .BP 100/60   Ht 5' 4.75" (1.645 m)   Wt 142 lb (64.4 kg)   LMP 06/12/2016   BMI 23.81 kg/m    General:   Pleasant Caucasian female appears to be in NAD, Well developed, Well nourished, alert and cooperative Head:  Normocephalic and atraumatic. Eyes:   PEERL, EOMI. No icterus. Conjunctiva pink. Ears:  Normal auditory acuity. Neck:  Supple Throat: Oral cavity and pharynx without inflammation, swelling or lesion. Teeth in good condition. Lungs: Respirations even and unlabored. Lungs clear to auscultation bilaterally.   No wheezes, crackles, or rhonchi.  Heart: Normal S1, S2. No MRG. Regular rate and rhythm. No peripheral edema, cyanosis or pallor.  Abdomen:  Soft, nondistended, generalized TTP, worse in the epigastrium and left lower quadrant No rebound or guarding. Decreased bowel sounds. No appreciable masses or hepatomegaly. Rectal:  Not performed.  Msk:  Symmetrical without gross deformities. Extremities:  Without edema, no deformity or joint abnormality. Normal ROM. Neurologic:  Alert and  oriented x4;  grossly normal neurologically.   Skin:   Dry and intact without significant lesions or rashes. Psychiatric: Oriented to person, place and time. Demonstrates good judgement and reason  without abnormal affect or behaviors.  RELEVANT LABS AND IMAGING: CBC    Component Value Date/Time   WBC 6.9 07/02/2016 1318   RBC 4.72 07/02/2016 1318   HGB 13.3 07/02/2016 1318   HCT 39.1 07/02/2016 1318   PLT 225 07/02/2016 1318   MCV 82.8 07/02/2016 1318   MCH 28.2 07/02/2016 1318   MCHC 34.0 07/02/2016 1318   RDW 13.0 07/02/2016 1318   LYMPHSABS 2.2 07/02/2016 1318   MONOABS 0.4 07/02/2016 1318   EOSABS 0.1 07/02/2016 1318   BASOSABS 0.1 07/02/2016 1318    CMP     Component Value Date/Time   NA 137 07/02/2016 1318   K 3.8 07/02/2016 1318   CL 108 07/02/2016 1318   CO2 21 (L) 07/02/2016 1318   GLUCOSE 84 07/02/2016 1318   BUN 12 07/02/2016 1318   CREATININE 0.72 07/02/2016 1318   CALCIUM 9.3 07/02/2016 1318   PROT 7.8  07/02/2016 1318   ALBUMIN 4.2 07/02/2016 1318   AST 19 07/02/2016 1318   ALT 14 07/02/2016 1318   ALKPHOS 45 07/02/2016 1318   BILITOT 0.1 (L) 07/02/2016 1318   GFRNONAA >60 07/02/2016 1318   GFRAA >60 07/02/2016 1318   EXAM: ABDOMEN - 2 VIEW 07/02/16  COMPARISON:  Plain film of the abdomen dated 09/21/2014.  FINDINGS: Bowel gas pattern is nonobstructive. No evidence of soft tissue mass or abnormal fluid collection. No evidence of free intraperitoneal air. No pathologic appearing calcifications. Osseous structures are unremarkable. Lung bases are clear  IMPRESSION: Negative.   Electronically Signed   By: Franki Cabot M.D.   On: 07/02/2016 14:01  Assessment: 1. Epigastric pain: Patient describes that this occurs intermittently throughout the day, unrelated to eating, history of anxiety and a large amount of stress, no overt heartburn or reflux symptoms; consider functional dyspepsia versus gastritis versus H. pylori versus other 2. Nausea: Mainly during times of severe constipation, likely related 3. Constipation: Patient reports some bowel movement every 2-3 days, if goes longer than this will become nauseous and sometimes even  vomit; consider IBS vs diet related 4. Bilateral lower abdominal cramping: Typically relieved with a bowel movement, likely represents IBS  Plan: 1. At this time discussed with the patient and her father that we could try some conservative measures for the next 2-3 weeks to see if this changes her current symptoms. The patient does have a very poor diet and is typically constipated, which could result in all of her symptoms. 2. Recommend the patient maintain a high-fiber diet, 25-35 g per day with the use of fiber supplement such as Metamucil, Citrucel or Benefiber. 3. Recommend the patient start daily MiraLax dosing, discussed that she can titrate this as needed. She may use this up to 4 times a day if necessary. 4. Prescribed hyoscyamine sulfate 0.125 mg every 4-6 hours when necessary, discussed that she should only use this when experiencing abdominal cramping symptoms. 5. Patient will follow in clinic in 2-3 weeks with me, if no change in symptoms will order further testing to include imaging versus EGD/colon  Ellouise Newer, PA-C [facility] 07/12/2016, 12:58 PM  Cc: Lucille Passy, MD

## 2016-07-12 NOTE — Progress Notes (Signed)
Agree with assessment and plan as outlined, and that symptoms are likely functional. May want to start just miralax for constipation initially, as fiber supplement can cause bloating in some patients and worsen cramping. For epigastric pain I would check H pylori stool test and consideration for trial of PPI if symptoms persist.

## 2016-07-12 NOTE — Patient Instructions (Addendum)
We have sent Zofran and hyoscyamine to your pharmacy   Use Metamucil as needed  Your follow up appointment is scheduled on 08/06/2016 Monday at 2:45pm  High-Fiber Diet Fiber, also called dietary fiber, is a type of carbohydrate found in fruits, vegetables, whole grains, and beans. A high-fiber diet can have many health benefits. Your health care provider may recommend a high-fiber diet to help:  Prevent constipation. Fiber can make your bowel movements more regular.  Lower your cholesterol.  Relieve hemorrhoids, uncomplicated diverticulosis, or irritable bowel syndrome.  Prevent overeating as part of a weight-loss plan.  Prevent heart disease, type 2 diabetes, and certain cancers. WHAT IS MY PLAN? The recommended daily intake of fiber includes:  38 grams for men under age 79.  75 grams for men over age 59.  1 grams for women under age 64.  64 grams for women over age 69. You can get the recommended daily intake of dietary fiber by eating a variety of fruits, vegetables, grains, and beans. Your health care provider may also recommend a fiber supplement if it is not possible to get enough fiber through your diet. WHAT DO I NEED TO KNOW ABOUT A HIGH-FIBER DIET?  Fiber supplements have not been widely studied for their effectiveness, so it is better to get fiber through food sources.  Always check the fiber content on thenutrition facts label of any prepackaged food. Look for foods that contain at least 5 grams of fiber per serving.  Ask your dietitian if you have questions about specific foods that are related to your condition, especially if those foods are not listed in the following section.  Increase your daily fiber consumption gradually. Increasing your intake of dietary fiber too quickly may cause bloating, cramping, or gas.  Drink plenty of water. Water helps you to digest fiber. WHAT FOODS CAN I EAT? Grains Whole-grain breads. Multigrain cereal. Oats and oatmeal.  Brown rice. Barley. Bulgur wheat. [place]. Bran muffins. Popcorn. Rye wafer crackers. Vegetables Sweet potatoes. Spinach. Kale. Artichokes. Cabbage. Broccoli. Green peas. Carrots. Squash. Fruits Berries. Pears. Apples. Oranges. Avocados. Prunes and raisins. Dried figs. Meats and Other Protein Sources [place], kidney, pinto, and soy beans. Split peas. Lentils. Nuts and seeds. Dairy Fiber-fortified yogurt. Beverages Fiber-fortified soy milk. Fiber-fortified orange juice. Other Fiber bars. The items listed above may not be a complete list of recommended foods or beverages. Contact your dietitian for more options. WHAT FOODS ARE NOT RECOMMENDED? Grains White bread. Pasta made with refined flour. White rice. Vegetables Fried potatoes. Canned vegetables. Well-cooked vegetables.  Fruits Fruit juice. Cooked, strained fruit. Meats and Other Protein Sources Fatty cuts of meat. Fried Sales executive or fried fish. Dairy Milk. Yogurt. Cream cheese. Sour cream. Beverages Soft drinks. Other Cakes and pastries. Butter and oils. The items listed above may not be a complete list of foods and beverages to avoid. Contact your dietitian for more information. WHAT ARE SOME TIPS FOR INCLUDING HIGH-FIBER FOODS IN MY DIET?  Eat a wide variety of high-fiber foods.  Make sure that half of all grains consumed each day are whole grains.  Replace breads and cereals made from refined flour or white flour with whole-grain breads and cereals.  Replace white rice with brown rice, bulgur wheat, or millet.  Start the day with a breakfast that is high in fiber, such as a cereal that contains at least 5 grams of fiber per serving.  Use beans in place of meat in soups, salads, or pasta.  Eat high-fiber snacks, such as  berries, raw vegetables, nuts, or popcorn.   This information is not intended to replace advice given to you by your health care provider. Make sure you discuss any questions you have with your health care  provider.   Document Released: 09/24/2005 Document Revised: 10/15/2014 Document Reviewed: 03/09/2014 Elsevier Interactive Patient Education [employer].

## 2016-07-31 ENCOUNTER — Ambulatory Visit: Payer: Self-pay | Admitting: Licensed Clinical Social Worker

## 2016-08-06 ENCOUNTER — Encounter: Payer: Self-pay | Admitting: Physician Assistant

## 2016-08-06 ENCOUNTER — Ambulatory Visit (INDEPENDENT_AMBULATORY_CARE_PROVIDER_SITE_OTHER): Payer: 59 | Admitting: Physician Assistant

## 2016-08-06 VITALS — BP 98/56 | HR 68 | Ht 65.0 in | Wt 142.4 lb

## 2016-08-06 DIAGNOSIS — K5900 Constipation, unspecified: Secondary | ICD-10-CM

## 2016-08-06 DIAGNOSIS — R110 Nausea: Secondary | ICD-10-CM | POA: Diagnosis not present

## 2016-08-06 DIAGNOSIS — R1084 Generalized abdominal pain: Secondary | ICD-10-CM

## 2016-08-06 NOTE — Progress Notes (Signed)
Agree with assessment and plan as outlined. Glad to see she is feeling better.

## 2016-08-06 NOTE — Progress Notes (Signed)
Chief Complaint: Constipation  HPI:  Victoria Simmons is a 20 year old Caucasian female who was originally referred to me by Lucille Passy, MD for a complaint of abdominal pain, nausea and vomiting, who returns to clinic today for follow-up .  Please recall patient was initially seen on 07/12/2016 and described 3 years of abdominal pain on an almost daily basis associated with the periumbilical pain which radiated into her epigastrium as well as cramping. She also described episodes of nausea and vomiting, bilateral lower abdominal cramping and constipation so severe that she ended up in the ED. At time of last visit we discussed the patient's poor diet and how this was likely related to her constipation. She was started on a high fiber diet, 25-35 g per day as well as daily MiraLAX dosing. She was given hyoscyamine sulfate when necessary for abdominal cramping.  Today, the patient presents to clinic accompanied by her mother and tells me that she feels "all better". She tells me that she has "not felt this good in years". Currently, the patient is eating 3 fiber gummies a day and using one dose of MiraLAX daily. She has only used her Levsin once or twice for occasional gas related cramping pain and this has helped as well. Her mother tells me that she did stop her MiraLAX for a period of 3 days and her symptoms started over, but once she started back on her MiraLAX she was better again. The patient is overwhelmed with how much better she feels.  Patient denies fever, chills, blood in her stool, melena, weight loss or other GI complaints today.   Past Medical History:  Diagnosis Date  . Ovarian torsion 2008    Past Surgical History:  Procedure Laterality Date  . OVARY SURGERY      Current Outpatient Prescriptions  Medication Sig Dispense Refill  . hyoscyamine (LEVSIN SL) 0.125 MG SL tablet Place 1 tablet (0.125 mg total) under the tongue every 4 (four) hours as needed. 30 tablet 0  .  norgestimate-ethinyl estradiol (SPRINTEC 28) 0.25-35 MG-MCG tablet TAKE 1 TABLET BY MOUTH DAILY. 28 tablet 11  . ondansetron (ZOFRAN) 4 MG tablet Take 1 tablet (4 mg total) by mouth every 6 (six) hours. 30 tablet 0   No current facility-administered medications for this visit.     Allergies as of 08/06/2016  . (No Known Allergies)    Family History  Problem Relation Age of Onset  . Hepatitis B Mother   . Hypertension Father     Social History   Social History  . Marital status: Single    Spouse name: N/A  . Number of children: N/A  . Years of education: N/A   Occupational History  . Not on file.   Social History Main Topics  . Smoking status: Never Smoker  . Smokeless tobacco: Never Used  . Alcohol use No  . Drug use: No  . Sexual activity: No   Other Topics Concern  . Not on file   Social History Narrative   ** Merged History Encounter **        Review of Systems:    Constitutional: No weight loss, fever, chills, weakness or fatigue HEENT: Eyes: No change in vision               Ears, Nose, Throat:  No change in hearing or congestion Skin: No rash or itching Cardiovascular: No chest pain, chest pressure or palpitations   Respiratory: No SOB or cough Gastrointestinal: See HPI  and otherwise negative Genitourinary: No dysuria or change in urinary frequency Neurological: No headache, dizziness or syncope Musculoskeletal: No new muscle or joint pain Hematologic: No bleeding or bruising Psychiatric: No history of depression or anxiety    Physical Exam:  Vital signs: BP (!) 98/56   Pulse 68   Ht 5\' 5"  (1.651 m)   Wt 142 lb 6.4 oz (64.6 kg)   LMP 07/31/2016   BMI 23.70 kg/m   General:   Pleasant Caucasian female appears to be in NAD, Well developed, Well nourished, alert and cooperative Head:  Normocephalic and atraumatic. Eyes:   PEERL, EOMI. No icterus. Conjunctiva pink. Ears:  Normal auditory acuity. Neck:  Supple Throat: Oral cavity and pharynx  without inflammation, swelling or lesion. Teeth in good condition. Lungs: Respirations even and unlabored. Lungs clear to auscultation bilaterally.   No wheezes, crackles, or rhonchi.  Heart: Normal S1, S2. No MRG. Regular rate and rhythm. No peripheral edema, cyanosis or pallor.  Abdomen:  Soft, nondistended, nontender. No rebound or guarding. Normal bowel sounds. No appreciable masses or hepatomegaly. Rectal:  Not performed.  Msk:  Symmetrical without gross deformities. Peripheral pulses intact.  Extremities:  Without edema, no deformity or joint abnormality. Normal ROM, normal sensation. Neurologic:  Alert and  oriented x4;  grossly normal neurologically. CN II-XII intact.  Skin:   Dry and intact without significant lesions or rashes. Psychiatric: Oriented to person, place and time. Demonstrates good judgement and reason without abnormal affect or behaviors.  RELEVANT LABS AND IMAGING: CBC    Component Value Date/Time   WBC 6.9 07/02/2016 1318   RBC 4.72 07/02/2016 1318   HGB 13.3 07/02/2016 1318   HCT 39.1 07/02/2016 1318   PLT 225 07/02/2016 1318   MCV 82.8 07/02/2016 1318   MCH 28.2 07/02/2016 1318   MCHC 34.0 07/02/2016 1318   RDW 13.0 07/02/2016 1318   LYMPHSABS 2.2 07/02/2016 1318   MONOABS 0.4 07/02/2016 1318   EOSABS 0.1 07/02/2016 1318   BASOSABS 0.1 07/02/2016 1318    CMP     Component Value Date/Time   NA 137 07/02/2016 1318   K 3.8 07/02/2016 1318   CL 108 07/02/2016 1318   CO2 21 (L) 07/02/2016 1318   GLUCOSE 84 07/02/2016 1318   BUN 12 07/02/2016 1318   CREATININE 0.72 07/02/2016 1318   CALCIUM 9.3 07/02/2016 1318   PROT 7.8 07/02/2016 1318   ALBUMIN 4.2 07/02/2016 1318   AST 19 07/02/2016 1318   ALT 14 07/02/2016 1318   ALKPHOS 45 07/02/2016 1318   BILITOT 0.1 (L) 07/02/2016 1318   GFRNONAA >60 07/02/2016 1318   GFRAA >60 07/02/2016 1318    Assessment: 1. Constipation: Resolved with addition of 3 fiber gummies a day and one dose MiraLAX daily 2.  Abdominal pain: Resolved with above, occasional cramping gas pain relieved with Levsin 3. Nausea: Resolved with above  Plan: 1. Continue current regimen including increased fiber intake and once daily dosing of MiraLAX. Did discuss that she can mix this in with other liquids if she does not like the taste. 2. Patient to follow in clinic as needed in the future.  Ellouise Newer, PA-C [facility] 08/06/2016, 3:06 PM  Cc: Lucille Passy, MD

## 2016-08-21 ENCOUNTER — Telehealth: Payer: Self-pay | Admitting: Physician Assistant

## 2016-08-22 ENCOUNTER — Ambulatory Visit: Payer: Self-pay | Admitting: Licensed Clinical Social Worker

## 2016-08-22 NOTE — Telephone Encounter (Signed)
Left message on machine to call back  

## 2016-08-23 NOTE — Telephone Encounter (Signed)
Left message on machine to call back  

## 2016-08-24 NOTE — Telephone Encounter (Signed)
Left message on machine to call back will await further communication from the pt  

## 2016-08-28 ENCOUNTER — Ambulatory Visit (INDEPENDENT_AMBULATORY_CARE_PROVIDER_SITE_OTHER): Payer: 59 | Admitting: Licensed Clinical Social Worker

## 2016-08-28 DIAGNOSIS — F411 Generalized anxiety disorder: Secondary | ICD-10-CM | POA: Diagnosis not present

## 2016-08-28 DIAGNOSIS — F331 Major depressive disorder, recurrent, moderate: Secondary | ICD-10-CM | POA: Diagnosis not present

## 2016-08-28 NOTE — Progress Notes (Signed)
Comprehensive Clinical Assessment (CCA) Note  08/28/2016 Victoria Simmons [id number]  Visit Diagnosis:      ICD-9-CM ICD-10-CM   1. Anxiety state 300.00 F41.1   2. Moderate episode of recurrent major depressive disorder (HCC) 296.32 F33.1       CCA Part One  Part One has been completed on paper by the patient.  (See scanned document in Chart Review)  CCA Part Two A  Intake/Chief Complaint:  CCA Intake With Chief Complaint CCA Part Two Date: 08/28/16 CCA Part Two Time: 1505 Chief Complaint/Presenting Problem: My anxiety is getting worse Patients Currently Reported Symptoms/Problems: I was referred by my primary Care Physician.  I am currently experiencing constant worry, unable to function in a crowd, feels judged by others, sweating, shaking, red spotches, shortness of breath, stomach pain, crying spells for the past 4 years.  I have issues with depression as well.  It comes and goes.  My Kandis Fantasia passed away in 06/25/23.  Thoughts of harming self, crying spells, isolation, low energy, lack of motivation.   Individual's Strengths: I am good with kids Individual's Preferences: increase motivation and talking to others without freaking out (crying) Type of Services Patient Feels Are Needed: therapy  Mental Health Symptoms Depression:  Depression: Change in energy/activity, Fatigue, Tearfulness, Increase/decrease in appetite, Sleep (too much or little), Difficulty Concentrating, Weight gain/loss  Mania:  Mania: N/A  Anxiety:   Anxiety: Difficulty concentrating, Fatigue, Worrying, Sleep  Psychosis:  Psychosis: N/A  Trauma:  Trauma: N/A  Obsessions:  Obsessions: N/A  Compulsions:  Compulsions: N/A  Inattention:  Inattention: N/A  Hyperactivity/Impulsivity:  Hyperactivity/Impulsivity: N/A  Oppositional/Defiant Behaviors:  Oppositional/Defiant Behaviors: N/A  Borderline Personality:  Emotional Irregularity: N/A  Other Mood/Personality Symptoms:      Mental Status  Exam Appearance and self-care  Stature:  Stature: Average  Weight:  Weight: Average weight  Clothing:  Clothing: Neat/clean  Grooming:  Grooming: Normal  Cosmetic use:  Cosmetic Use: Age appropriate  Posture/gait:  Posture/Gait: Normal  Motor activity:  Motor Activity: Not Remarkable  Sensorium  Attention:  Attention: Normal  Concentration:  Concentration: Normal  Orientation:  Orientation: X5  Recall/memory:  Recall/Memory: Normal  Affect and Mood  Affect:  Affect: Appropriate  Mood:  Mood: Anxious  Relating  Eye contact:  Eye Contact: Normal  Facial expression:  Facial Expression: Responsive  Attitude toward examiner:  Attitude Toward Examiner: Cooperative  Thought and Language  Speech flow: Speech Flow: Normal  Thought content:  Thought Content: Appropriate to mood and circumstances  Preoccupation:     Hallucinations:     Organization:     Transport planner of Knowledge:  Fund of Knowledge: Average  Intelligence:  Intelligence: Average  Abstraction:  Abstraction: Normal  Judgement:  Judgement: Normal  Reality Testing:  Reality Testing: Adequate  Insight:  Insight: Good  Decision Making:  Decision Making: Normal  Social Functioning  Social Maturity:  Social Maturity: Responsible  Social Judgement:  Social Judgement: Normal  Stress  Stressors:  Stressors:  (school)  Coping Ability:  Coping Ability: English as a second language teacher Deficits:     Supports:      Family and Psychosocial History: Family history Marital status: Single Are you sexually active?: No What is your sexual orientation?: homosexual Does patient have children?: No  Childhood History:  Childhood History By whom was/is the patient raised?: Both parents Additional childhood history information: Describes childhood as: pretty normal.  Moved to [place] at age 63 Description of patient's relationship with caregiver when they  were a child: Mother: it was fine.  it began getting worse during teenage years but  improved.  Father: good Patient's description of current relationship with people who raised him/her: Mother: it is getting better Father: good How were you disciplined when you got in trouble as a child/adolescent?: time out, sent to room, spanked Does patient have siblings?: Yes Number of Siblings: 2 (Clae 55, Haiven 37) Description of patient's current relationship with siblings: normal; we fight but we get over it.  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, mining How long has patient been employed?: 76months Patient's job has been impacted by current illness: Yes Describe how patient's job has been impacted: I call out a lot about 4-5 times monthly What is the longest time patient has a held a job?: 2 years Where was the patient employed at that time?: [employer] Has patient ever been in the [employer]?: No  Education: Education Name of [place]: [place] Did Teacher, adult education From [employer]?: Yes Did Physicist, medical?: Yes What Type of College Degree Do you Have?: Currently working on degree in [place] Did [place]?: No What Was Your Major?: Kinesology Did You Have An Individualized Education Program (IIEP): No Did You Have Any Difficulty At [employer]?: No  Religion: Religion/Spirituality Are You A Religious Person?: No  Leisure/Recreation: Leisure / Recreation Leisure and Hobbies: going out with friends, shopping, vacationing, relaxing in bed at  home, movies, restaurants  Exercise/Diet: Exercise/Diet Do You Exercise?: No Have You Gained or Lost A Significant Amount of Weight in the Past Six Months?: Yes-Gained Number of Pounds Gained: 10 (due to stomach concerns) Do You Follow a Special Diet?:  (high fiber) Do You Have Any Trouble Sleeping?: Yes Explanation of Sleeping Difficulties: has difficulty staying asleep  CCA Part Two C  Alcohol/Drug Use: Alcohol / Drug Use Pain Medications: denies Prescriptions: Sprintec 28 History of alcohol / drug use?: No history of alcohol / drug abuse                      CCA Part Three  ASAM's:  Six Dimensions of Multidimensional Assessment  Dimension 1:  Acute Intoxication and/or Withdrawal Potential:     Dimension 2:  Biomedical Conditions and Complications:     Dimension 3:  Emotional, Behavioral, or Cognitive Conditions and Complications:     Dimension 4:  Readiness to Change:     Dimension 5:  Relapse, Continued use, or Continued Problem Potential:     Dimension 6:  Recovery/Living Environment:      Substance use Disorder (SUD)    Social Function:  Social Functioning Social Maturity: Responsible Social Judgement: Normal  Stress:  Stress Stressors:  (school) Coping Ability: Overwhelmed Patient Takes Medications The Way The Doctor Instructed?: Yes Priority Risk: Low Acuity  Risk Assessment- Self-Harm Potential: Risk Assessment For Self-Harm Potential Thoughts of Self-Harm: No current thoughts Method: No plan Availability of Means: No access/NA  Risk Assessment -Dangerous to Others Potential: Risk Assessment For Dangerous to Others Potential Method: No  Plan Availability of Means: No access or NA Intent: Vague intent or NA Notification Required: No need or identified person  DSM5 Diagnoses: Patient Active Problem List   Diagnosis Date Noted  . Menorrhagia 01/25/2014  . Anxiety state 01/26/2013    Patient Centered Plan: Will complete at next session  with Patient goals  Recommendations for Services/Supports/Treatments: Recommendations for Services/Supports/Treatments Recommendations For Services/Supports/Treatments: Individual Therapy, Medication Management  Treatment Plan Summary:    Referrals to Alternative Service(s): Referred to Alternative Service(s):   Place:   Date:   Time:    Referred to Alternative Service(s):   Place:   Date:   Time:    Referred to Alternative Service(s):   Place:   Date:   Time:    Referred to Alternative Service(s):   Place:   Date:   Time:     [place]

## 2016-09-07 ENCOUNTER — Emergency Department (HOSPITAL_COMMUNITY)
Admission: EM | Admit: 2016-09-07 | Discharge: 2016-09-07 | Disposition: A | Discharge status: Home / Self Care | Payer: 59 | Attending: Emergency Medicine | Admitting: Emergency Medicine

## 2016-09-07 ENCOUNTER — Ambulatory Visit (INDEPENDENT_AMBULATORY_CARE_PROVIDER_SITE_OTHER): Payer: 59 | Admitting: Physician Assistant

## 2016-09-07 ENCOUNTER — Encounter (HOSPITAL_COMMUNITY): Payer: Self-pay | Admitting: Emergency Medicine

## 2016-09-07 ENCOUNTER — Emergency Department (HOSPITAL_COMMUNITY): Payer: 59

## 2016-09-07 ENCOUNTER — Encounter: Payer: Self-pay | Admitting: Physician Assistant

## 2016-09-07 VITALS — BP 112/60 | HR 72 | Ht 64.75 in | Wt 139.0 lb

## 2016-09-07 DIAGNOSIS — R935 Abnormal findings on diagnostic imaging of other abdominal regions, including retroperitoneum: Secondary | ICD-10-CM | POA: Diagnosis not present

## 2016-09-07 DIAGNOSIS — R194 Change in bowel habit: Secondary | ICD-10-CM

## 2016-09-07 DIAGNOSIS — N3000 Acute cystitis without hematuria: Secondary | ICD-10-CM | POA: Diagnosis not present

## 2016-09-07 DIAGNOSIS — R109 Unspecified abdominal pain: Secondary | ICD-10-CM

## 2016-09-07 DIAGNOSIS — R1030 Lower abdominal pain, unspecified: Secondary | ICD-10-CM | POA: Diagnosis not present

## 2016-09-07 DIAGNOSIS — R1084 Generalized abdominal pain: Secondary | ICD-10-CM

## 2016-09-07 DIAGNOSIS — R1013 Epigastric pain: Secondary | ICD-10-CM

## 2016-09-07 LAB — COMPREHENSIVE METABOLIC PANEL
ALT: 14 U/L (ref 14–54)
AST: 21 U/L (ref 15–41)
Albumin: 3.9 g/dL (ref 3.5–5.0)
Alkaline Phosphatase: 45 U/L (ref 38–126)
Anion gap: 13 (ref 5–15)
BUN: 12 mg/dL (ref 6–20)
CO2: 21 mmol/L — ABNORMAL LOW (ref 22–32)
Calcium: 9.7 mg/dL (ref 8.9–10.3)
Chloride: 104 mmol/L (ref 101–111)
Creatinine, Ser: 0.83 mg/dL (ref 0.44–1.00)
GFR calc Af Amer: >60 mL/min (ref >60)
GFR calc non Af Amer: >60 mL/min (ref >60)
Glucose, Bld: 87 mg/dL (ref 65–99)
Potassium: 3.5 mmol/L (ref 3.5–5.1)
Sodium: 138 mmol/L (ref 135–145)
Total Bilirubin: 0.9 mg/dL (ref 0.3–1.2)
Total Protein: 6.7 g/dL (ref 6.5–8.1)

## 2016-09-07 LAB — CBC WITH DIFFERENTIAL/PLATELET
Basophils Absolute: 0.1 10*3/uL (ref 0.0–0.1)
Basophils Relative: 1 %
Eosinophils Absolute: 0.2 10*3/uL (ref 0.0–0.7)
Eosinophils Relative: 2 %
HCT: 37.3 % (ref 36.0–46.0)
Hemoglobin: 12.8 g/dL (ref 12.0–15.0)
Lymphocytes Relative: 32 %
Lymphs Abs: 3.1 10*3/uL (ref 0.7–4.0)
MCH: 28.3 pg (ref 26.0–34.0)
MCHC: 34.3 g/dL (ref 30.0–36.0)
MCV: 82.3 fL (ref 78.0–100.0)
Monocytes Absolute: 0.6 10*3/uL (ref 0.1–1.0)
Monocytes Relative: 6 %
Neutro Abs: 5.7 10*3/uL (ref 1.7–7.7)
Neutrophils Relative %: 59 %
Platelets: 270 10*3/uL (ref 150–400)
RBC: 4.53 MIL/uL (ref 3.87–5.11)
RDW: 12.9 % (ref 11.5–15.5)
WBC: 9.6 10*3/uL (ref 4.0–10.5)

## 2016-09-07 LAB — URINE MICROSCOPIC-ADD ON

## 2016-09-07 LAB — URINALYSIS, ROUTINE W REFLEX MICROSCOPIC
Bilirubin Urine: NEGATIVE
Glucose, UA: NEGATIVE mg/dL
Hgb urine dipstick: NEGATIVE
Ketones, ur: NEGATIVE mg/dL
Nitrite: NEGATIVE
Protein, ur: NEGATIVE mg/dL
Specific Gravity, Urine: 1.026 (ref 1.005–1.030)
pH: 8.5 — ABNORMAL HIGH (ref 5.0–8.0)

## 2016-09-07 LAB — I-STAT BETA HCG BLOOD, ED (MC, WL, AP ONLY): I-stat hCG, quantitative: <5 m[IU]/mL (ref <5)

## 2016-09-07 LAB — LIPASE, BLOOD: Lipase: 14 U/L (ref 11–51)

## 2016-09-07 LAB — PREGNANCY, URINE: Preg Test, Ur: NEGATIVE

## 2016-09-07 MED ORDER — NA SULFATE-K SULFATE-MG SULF 17.5-3.13-1.6 GM/177ML PO SOLN: 1.0000 | ORAL | 0 refills | Status: DC

## 2016-09-07 MED ORDER — IOPAMIDOL (ISOVUE-300) INJECTION 61%
INTRAVENOUS | Status: AC
  Administered 2016-09-07: 100 mL
  Filled 2016-09-07: qty 100

## 2016-09-07 MED ORDER — MORPHINE SULFATE (PF) 4 MG/ML IV SOLN
4.0000 mg | Freq: Once | INTRAVENOUS | Status: AC
  Administered 2016-09-07: 4 mg via INTRAVENOUS
  Filled 2016-09-07: qty 1

## 2016-09-07 MED ORDER — CEPHALEXIN 500 MG PO CAPS: 500.0000 mg | ORAL_CAPSULE | Freq: Two times a day (BID) | ORAL | 0 refills | Status: DC

## 2016-09-07 MED ORDER — SODIUM CHLORIDE 0.9 % IV BOLUS (SEPSIS)
1000.0000 mL | Freq: Once | INTRAVENOUS | Status: AC
  Administered 2016-09-07: 1000 mL via INTRAVENOUS

## 2016-09-07 MED ORDER — DICYCLOMINE HCL 20 MG PO TABS: 20.0000 mg | ORAL_TABLET | Freq: Two times a day (BID) | ORAL | 0 refills | Status: DC

## 2016-09-07 MED ORDER — METOCLOPRAMIDE HCL 5 MG/ML IJ SOLN
10.0000 mg | INTRAMUSCULAR | Status: AC
  Administered 2016-09-07: 10 mg via INTRAVENOUS
  Filled 2016-09-07: qty 2

## 2016-09-07 MED FILL — CEPHALEXIN 500 MG CAPSULE: 500 | 7 days supply | Qty: 14 | Fill #0

## 2016-09-07 MED FILL — DICYCLOMINE 20 MG TABLET: 20 | 10 days supply | Qty: 20 | Fill #0

## 2016-09-07 NOTE — ED Triage Notes (Signed)
Pt to ED for severe abdominal pain that started tonight. Pt has had the abdominal off and on for two months along with nausea, vomiting, and gas. Pt has had 4 episodes of emesis in last 24 hours. No blood noted, but notes more digested food in emesis lately. Pt had diarrhea yesterday and that is off and on. No blood noted. Pt has a long history of GI issues. Not able to hold foods or drinks down.

## 2016-09-07 NOTE — Patient Instructions (Signed)

## 2016-09-07 NOTE — ED Notes (Signed)
Pt ambulatory at discharge, NAD, VSS, pt verbalized understanding of need to follow up with GI stating that she has GI apt. Today.

## 2016-09-07 NOTE — ED Notes (Signed)
Pt transported to CT ?

## 2016-09-07 NOTE — Discharge Instructions (Signed)
Take the prescribed medication as directed. Follow-up with your GI doctor. If you are unable to see them in a reasonable amount of time, recommend to follow-up with your primary care doctor in the interim. Return to the ED for new or worsening symptoms.

## 2016-09-07 NOTE — ED Notes (Signed)
Pt getting dressed and ready for discharge. 

## 2016-09-07 NOTE — Progress Notes (Signed)
Agree with assessment and plan 

## 2016-09-07 NOTE — ED Provider Notes (Signed)
[facility] DEPT Provider Note   CSN: [id number] Arrival date & time: 09/07/16  0534     History   Chief Complaint Chief Complaint  Patient presents with  . Abdominal Pain    HPI Victoria Simmons is a 21 y.o. female.  The history is provided by the patient and medical records.  Abdominal Pain      20 y.o F with hx of right ovarian torsion at age 4 s/p oopherectomy, presenting to the ED for abdominal pain.  Patient reports she has been having Intermittent abdominal pain for the past 2 months. States she was seen at urgent care in September for constipation and had an abdominal x-ray performed which showed "gas bubbles ". She was referred to a GI doctor who started her on miralax.  Patient reports she changed her diet and used the miralax as directed which helped for about 2 week, however soon after she developed nausea and vomiting. States this is been worse over the past 2 weeks to the point where she is not able to eat or drink as everything that she tries to swallow she immediately throws up. Reports she is also had some watery, nonbloody diarrhea. No fever or chills. No sick contacts. States pain usually starts around her umbilicus and spreads outward from there.  States currently pain has been worse in the left lower side of her abdomen.  No hx of IBS, crohn's in patient or family.  Denies pelvic pain, vaginal discharge.  Does report urinating several times before she feels her bladder is truly empty.  No dysuria or hematuria.    Past Medical History:  Diagnosis Date  . Ovarian torsion 2008    Patient Active Problem List   Diagnosis Date Noted  . Menorrhagia 01/25/2014  . Anxiety state 01/26/2013    Past Surgical History:  Procedure Laterality Date  . OVARY SURGERY      OB History    Gravida Para Term Preterm AB Living   0 0 0 0 0     SAB TAB Ectopic Multiple Live Births   0 0 0           Home Medications    Prior to Admission medications   Medication Sig  Start Date End Date Taking? Authorizing Provider  hyoscyamine (LEVSIN SL) 0.125 MG SL tablet Place 1 tablet (0.125 mg total) under the tongue every 4 (four) hours as needed. 07/12/16   Levin Erp, PA  norgestimate-ethinyl estradiol (SPRINTEC 28) 0.25-35 MG-MCG tablet TAKE 1 TABLET BY MOUTH DAILY. 05/07/16   Lucille Passy, MD  ondansetron (ZOFRAN) 4 MG tablet Take 1 tablet (4 mg total) by mouth every 6 (six) hours. 07/12/16   Levin Erp, PA    Family History Family History  Problem Relation Age of Onset  . Hepatitis B Mother   . Hypertension Father     Social History Social History  Substance Use Topics  . Smoking status: Never Smoker  . Smokeless tobacco: Never Used  . Alcohol use No     Allergies   Patient has no known allergies.   Review of Systems Review of Systems  Gastrointestinal: Positive for abdominal pain.  All other systems reviewed and are negative.    Physical Exam Updated Vital Signs BP 116/73   Pulse 75   Temp 98.5 F (36.9 C)   Resp 20   LMP 09/04/2016 (Approximate)   SpO2 95%   Physical Exam  Constitutional: She is oriented to person, place, and  time. She appears well-developed and well-nourished.  HENT:  Head: Normocephalic and atraumatic.  Mouth/Throat: Oropharynx is clear and moist.  Eyes: Conjunctivae and EOM are normal. Pupils are equal, round, and reactive to light.  Neck: Normal range of motion.  Cardiovascular: Normal rate, regular rhythm and normal heart sounds.   Pulmonary/Chest: Effort normal and breath sounds normal. No respiratory distress.  Abdominal: Soft. Bowel sounds are normal. She exhibits no distension and no mass. There is tenderness.  Abdomen soft, non-distended, tenderness in the left lower region  Musculoskeletal: Normal range of motion.  Neurological: She is alert and oriented to person, place, and time.  Skin: Skin is warm and dry.  Psychiatric: She has a normal mood and affect.  Nursing note and  vitals reviewed.    ED Treatments / Results  Labs (all labs ordered are listed, but only abnormal results are displayed) Labs Reviewed  URINALYSIS, ROUTINE W REFLEX MICROSCOPIC (NOT AT [facility]) - Abnormal; Notable for the following:       Result Value   APPearance CLOUDY (*)    pH 8.5 (*)    Leukocytes, UA SMALL (*)    All other components within normal limits  COMPREHENSIVE METABOLIC PANEL - Abnormal; Notable for the following:    CO2 21 (*)    All other components within normal limits  URINE MICROSCOPIC-ADD ON - Abnormal; Notable for the following:    Squamous Epithelial / LPF 0-5 (*)    Bacteria, UA MANY (*)    All other components within normal limits  URINE CULTURE  PREGNANCY, URINE  LIPASE, BLOOD  CBC WITH DIFFERENTIAL/PLATELET  I-STAT BETA HCG BLOOD, ED ([facility], [facility], [facility] ONLY)    EKG  EKG Interpretation None       Radiology Ct Abdomen Pelvis W Contrast  Result Date: 09/07/2016 CLINICAL DATA:  Abdominal pain with nausea and vomiting EXAM: CT ABDOMEN AND PELVIS WITH CONTRAST TECHNIQUE: Multidetector CT imaging of the abdomen and pelvis was performed using the standard protocol following bolus administration of intravenous contrast. CONTRAST:  144mL ISOVUE-300 IOPAMIDOL (ISOVUE-300) INJECTION 61% COMPARISON:  June 29, 2007 FINDINGS: Lower chest: Lung bases are clear. Hepatobiliary: No focal liver lesions are evident. Gallbladder wall is not appreciably thickened. There is no biliary duct dilatation. Pancreas: No pancreatic mass or inflammatory focus. Spleen: No splenic lesions are evident. Adrenals/Urinary Tract: Adrenals appear normal bilaterally. Kidneys bilaterally show no demonstrable mass or hydronephrosis on either side. There is no renal or ureteral calculus on either side. Urinary bladder is midline with wall thickness within normal limits. Stomach/Bowel: There is no appreciable bowel wall or mesenteric thickening. There are scattered left-sided colonic diverticula  without diverticulitis. There is no bowel obstruction. No evident free air or portal venous air. Note that there is stranding in the fat surrounding the upper rectum region without appreciable wall thickening in this area. No similar stranding of fat seen elsewhere. Vascular/Lymphatic: There is no abdominal aortic aneurysm. No vascular lesions are evident on this study. There is no adenopathy in the abdomen or pelvis. Reproductive: Uterus is retroverted. Right ovary appears absent. There is no pelvic mass or pelvic fluid collection. Appendix appears unremarkable. There is no ascites or abscess in the abdomen or pelvis. Other: Appendix appears normal. There is no ascites or abscess in the abdomen or pelvis. Musculoskeletal: No blastic or lytic bone lesions. No intramuscular or abdominal wall lesion. IMPRESSION: There is stranding in the fat surrounding the upper rectum on both sides without appreciable rectal wall thickening. This finding potentially  may be represent a degree of proctitis. Similar stranding of fat elsewhere is not evident. There is no demonstrable bowel obstruction. No omental lesions are evident. No abscess. Appendix appears normal. Apparent absence of right ovary. No renal or ureteral calculus. No hydronephrosis. Uterus retroverted. Electronically Signed   By: Lowella Grip III M.D.   On: 09/07/2016 08:51    Procedures Procedures (including critical care time)  Medications Ordered in ED Medications - No data to display   Initial Impression / Assessment and Plan / ED Course  I have reviewed the triage vital signs and the nursing notes.  Pertinent labs & imaging results that were available during my care of the patient were reviewed by me and considered in my medical decision making (see chart for details).  Clinical Course    20 y.o. F here with intermittent abdominal pain x 2 months.  She is afebrile and nontoxic here. She does report urinary frequency and symptoms that sound  like double voiding. She denies any dysuria or hematuria. She has no flank pain or fever. Abdomen is soft and nondistended. She does have some tenderness in the suprapubic region and left lower quadrant. Basic labwork is reassuring. Her UA does appear infectious, will send for culture. CT scan with question of proctitis.  Patient denies rectal pain.  She has been having diarrhea for about 2 weeks now. There is no evidence of colitis or obstruction or other acute findings to explain her pain.  Patient's pain was well controlled here. Feel she is stable for discharge. Will start on Keflex for UTI pending urine culture. She is also given Bentyl. I recommended that she follow-up with her GI doctor.  Discussed plan with patient and mother at bedside, they both acknowledged understanding and agreed with plan of care.  Return precautions given for new or worsening symptoms.  Final Clinical Impressions(s) / ED Diagnoses   Final diagnoses:  Abdominal pain, unspecified abdominal location  Acute cystitis without hematuria    New Prescriptions Discharge Medication List as of 09/07/2016 10:41 AM    START taking these medications   Details  cephALEXin (KEFLEX) 500 MG capsule Take 1 capsule (500 mg total) by mouth 2 (two) times daily., Starting Fri 09/07/2016, Print    dicyclomine (BENTYL) 20 MG tablet Take 1 tablet (20 mg total) by mouth 2 (two) times daily., Starting Fri 09/07/2016, Print         Larene Pickett, PA-C 09/07/16 1326    Ezequiel Essex, MD 09/07/16 [phone]

## 2016-09-07 NOTE — Progress Notes (Signed)
Chief Complaint: Abdominal Pain, Nausea, Vomiting  HPI:   [name] is a 20 year old Caucasian female who returns to clinic today for a complaint of abdominal pain, nausea and vomiting. Please recall patient has been seen in clinic previously by myself on 2 separate occasions initially on 07/12/2016 with a complaint of abdominal pain and epigastric pain as well as constipation. At that time the patient was started on high fiber diet, MiraLAX and hyoscyamine. She returned to clinic on 08/06/16 and felt much better. She denied any problems. She was using 3 fiber gummies a day and one dose of MiraLAX. She had use Levsin once or twice for occasional gas related cramping pain which had helped.   Per chart review patient was seen in the ED this morning for a complaint of intermittent abdominal pain for the past 2 months and severe abdominal pain at time of presentation. At that time she described that MiraLAX and a change in diet helped for 2 weeks, however soon after she developed nausea and vomiting. Apparently this has been worse over the past 2 weeks to the point she where she was not able to eat or drink anything. When she does swallow something she immediately throws it up. She also reported watery nonbloody diarrhea. The pain she experiences radiates from her umbilicus outward. Currently worsening left lower quadrant. CMP, CBC, lipase were normal. Urinalysis was done and did show many bacteria, culture was pending. CT of the abdomen and pelvis with contrast did show some stranding of fat surrounding the upper rectum on both sides without appreciable rectal wall thickening. This was thought to possibly represent a degree of proctitis. Patient had no sign of similar stranding fat elsewhere. There was no demonstrable bowel obstruction. No omental lesions were evident. No abscess. Appendix appeared normal. No renal or ureteral calculi.   Of note, patient also went to see her counselor recently on 08/28/16 with  a complaint of increasing anxiety and moderate episode of major depressive or disorder. She described constant inability to function, feeling of being judged by others, sweating, shaking, red blotches, shortness of breath, stomach pain and crying spells she also had thoughts of harming herself, isolation, low energy and lack of motivation.   Today, the patient presents to clinic accompanied by her mother. They tell me that the patient did well for about 3 more days after she saw me in clinic last and then started with repeat symptoms. She describes that she has now has abdominal pain which will come on "out of the blue", this is typically periumbilical and will then radiate up into her epigastrium. Initially this makes her feel as though she has to go the bathroom, but she tells me that she has been using one dose of MiraLAX a day and does not have a problem moving her bowels anymore. In fact the patient tells me she has been having diarrhea for at least 2 weeks. She has continued her MiraLAX dosing during this time. She describes feeling completely empty when she has the diarrhea. The patient tells me that this abdominal pain can last for one to 2 half hours after it comes on. The patient tried using hyoscyamine but this stopped working.This abdominal pain was so severe that it is sent her to the ER this morning as above.   Patient also tells me today that she has episodes of nausea and vomiting. She has been trying to use Zofran which doesn't seem to help. She describes that she will have 1-2  good days where she can eat and feels fine but then on the third day will become nauseous and vomit anything that she puts in her mouth. She also describes vomiting even if she is not eating anything. This will last for one to 2 days and then slowly go away and she will have a couple of good days. This has been the cycle recently.   When asked about her anxiety and stress the patient tells me that this is no more than  usual. This has been constant for her for years. Patient does tell me that she is anxious about what could be going on in her body today. She is eager to get further procedures to figure this out.   Patient denies fever, chills, blood in her stool, melena, weight loss, heartburn, reflux, dysphagia or symptoms that awaken her at night.  Past Medical History:  Diagnosis Date  . Ovarian torsion 2008    Past Surgical History:  Procedure Laterality Date  . OVARY SURGERY      Current Outpatient Prescriptions  Medication Sig Dispense Refill  . cephALEXin (KEFLEX) 500 MG capsule Take 1 capsule (500 mg total) by mouth 2 (two) times daily. 14 capsule 0  . dicyclomine (BENTYL) 20 MG tablet Take 1 tablet (20 mg total) by mouth 2 (two) times daily. 20 tablet 0  . hyoscyamine (LEVSIN SL) 0.125 MG SL tablet Place 1 tablet (0.125 mg total) under the tongue every 4 (four) hours as needed. (Patient not taking: Reported on 09/07/2016) 30 tablet 0  . norgestimate-ethinyl estradiol (SPRINTEC 28) 0.25-35 MG-MCG tablet TAKE 1 TABLET BY MOUTH DAILY. 28 tablet 11  . ondansetron (ZOFRAN) 4 MG tablet Take 1 tablet (4 mg total) by mouth every 6 (six) hours. (Patient not taking: Reported on 09/07/2016) 30 tablet 0   No current facility-administered medications for this visit.     Allergies as of 09/07/2016  . (No Known Allergies)    Family History  Problem Relation Age of Onset  . Hepatitis B Mother   . Hypertension Father     Social History   Social History  . Marital status: Single    Spouse name: N/A  . Number of children: N/A  . Years of education: N/A   Occupational History  . Not on file.   Social History Main Topics  . Smoking status: Never Smoker  . Smokeless tobacco: Never Used  . Alcohol use No  . Drug use: No  . Sexual activity: No   Other Topics Concern  . Not on file   Social History Narrative   ** Merged History Encounter **        Review of Systems:     Constitutional: No  weight loss, fever, chills, weakness or fatigue Cardiovascular: No chest pain Respiratory: No SOB  Gastrointestinal: See HPI and otherwise negative Psychiatric: Positive for history of depression and anxiety    Physical Exam:  Vital signs: .BP 112/60 (BP Location: Left Arm, Patient Position: Sitting, Cuff Size: Normal)   Pulse 72   Ht 5' 4.75" (1.645 m)   Wt 139 lb (63 kg)   LMP 09/04/2016 (Approximate)   BMI 23.31 kg/m    Constitutional:   Pleasant Caucasian female appears to be in NAD, Well developed, Well nourished, alert and cooperative Respiratory: Respirations even and unlabored. Lungs clear to auscultation bilaterally.   No wheezes, crackles, or rhonchi.  Cardiovascular: Normal S1, S2. No MRG. Regular rate and rhythm. No peripheral edema, cyanosis or pallor.  Gastrointestinal:  Soft, nondistended, mild generalized abdominal tenderness worse in the suprapubic region and up into the left upper quadrant. No rebound or guarding. Normal bowel sounds. No appreciable masses or hepatomegaly. Psychiatric: Oriented to person, place and time. Demonstrates good judgement and reason without abnormal affect or behaviors.  MOST RECENT LABS AND IMAGING: CBC    Component Value Date/Time   WBC 9.6 09/07/2016 0610   RBC 4.53 09/07/2016 0610   HGB 12.8 09/07/2016 0610   HCT 37.3 09/07/2016 0610   PLT 270 09/07/2016 0610   MCV 82.3 09/07/2016 0610   MCH 28.3 09/07/2016 0610   MCHC 34.3 09/07/2016 0610   RDW 12.9 09/07/2016 0610   LYMPHSABS 3.1 09/07/2016 0610   MONOABS 0.6 09/07/2016 0610   EOSABS 0.2 09/07/2016 0610   BASOSABS 0.1 09/07/2016 0610    CMP     Component Value Date/Time   NA 138 09/07/2016 0610   K 3.5 09/07/2016 0610   CL 104 09/07/2016 0610   CO2 21 (L) 09/07/2016 0610   GLUCOSE 87 09/07/2016 0610   BUN 12 09/07/2016 0610   CREATININE 0.83 09/07/2016 0610   CALCIUM 9.7 09/07/2016 0610   PROT 6.7 09/07/2016 0610   ALBUMIN 3.9 09/07/2016 0610   AST 21 09/07/2016  0610   ALT 14 09/07/2016 0610   ALKPHOS 45 09/07/2016 0610   BILITOT 0.9 09/07/2016 0610   GFRNONAA >60 09/07/2016 0610   GFRAA >60 09/07/2016 0610   CT ABDOMEN AND PELVIS WITH CONTRAST 09/07/16  TECHNIQUE: Multidetector CT imaging of the abdomen and pelvis was performed using the standard protocol following bolus administration of intravenous contrast.  CONTRAST:  128mL ISOVUE-300 IOPAMIDOL (ISOVUE-300) INJECTION 61%  COMPARISON:  June 29, 2007  FINDINGS: Lower chest: Lung bases are clear.  Hepatobiliary: No focal liver lesions are evident. Gallbladder wall is not appreciably thickened. There is no biliary duct dilatation.  Pancreas: No pancreatic mass or inflammatory focus.  Spleen: No splenic lesions are evident.  Adrenals/Urinary Tract: Adrenals appear normal bilaterally. Kidneys bilaterally show no demonstrable mass or hydronephrosis on either side. There is no renal or ureteral calculus on either side. Urinary bladder is midline with wall thickness within normal limits.  Stomach/Bowel: There is no appreciable bowel wall or mesenteric thickening. There are scattered left-sided colonic diverticula without diverticulitis. There is no bowel obstruction. No evident free air or portal venous air. Note that there is stranding in the fat surrounding the upper rectum region without appreciable wall thickening in this area. No similar stranding of fat seen elsewhere.  Vascular/Lymphatic: There is no abdominal aortic aneurysm. No vascular lesions are evident on this study. There is no adenopathy in the abdomen or pelvis.  Reproductive: Uterus is retroverted. Right ovary appears absent. There is no pelvic mass or pelvic fluid collection. Appendix appears unremarkable. There is no ascites or abscess in the abdomen or pelvis.  Other: Appendix appears normal. There is no ascites or abscess in the abdomen or pelvis.  Musculoskeletal: No blastic or lytic bone  lesions. No intramuscular or abdominal wall lesion.  IMPRESSION: There is stranding in the fat surrounding the upper rectum on both sides without appreciable rectal wall thickening. This finding potentially may be represent a degree of proctitis. Similar stranding of fat elsewhere is not evident. There is no demonstrable bowel obstruction. No omental lesions are evident.  No abscess. Appendix appears normal. Apparent absence of right ovary.  No renal or ureteral calculus. No hydronephrosis. Uterus retroverted.   Electronically Signed   By: Gwyndolyn Saxon  Jasmine December III M.D.   On: 09/07/2016 08:51  Assessment: 1. Abdominal pain: Per patient this continues and is cyclical in nature, does not seem to be brought on by constipation anymore as the patient is experiencing daily diarrhea, continued to consider irritable bowel syndrome/functional dyspepsia versus other 2. Dyspepsia: Patient has episodes every 3 days or so of nausea and vomiting, these can be brought on without eating. Of note patient does have a long history of anxiety and depression, likely these are contributing to what I believe is most likely functional dyspepsia 3. Abnormal CT of the rectum: Patient denies CT the abdomen and pelvis today which did show some stranding in the fat surrounding the upper rectum on both sides without appreciable rectal wall thickening, this is likely due to frequency of bowel movements currently, though will need to rule out proctitis versus other 4. Change in Bowel Habits: Patient reports daily liquid stools, 1-2 times per day. She is still using MiraLAX currently on a daily basis to avoid constipation, likely this is the reason she is having diarrhea  Plan: 1. Recommend patient proceed with EGD and colonoscopy for further evaluation. Discussed risks, benefits, limitations and alternatives and the patient agrees to proceed. These were scheduled with Dr. Havery Moros in the [facility] Patient is eager to  have procedures because she wants to make sure that "nothing is wrong". 2. Discussed with the patient that she should stop MiraLAX for now and titrate in the future to solid bowel movements. 3. She can try using the Bentyl 20 mg which was prescribed during her ER visit today. Discussed that she can use this 3 times a day if she finds that this helps. 4. Patient to follow in clinic per Dr. Doyne Keel recommendations in the future.  Ellouise Newer, PA-C [facility] 09/07/2016, 1:06 PM  Cc: Lucille Passy, MD

## 2016-09-08 LAB — URINE CULTURE: Culture: NO GROWTH

## 2016-09-10 ENCOUNTER — Telehealth: Payer: Self-pay | Admitting: Physician Assistant

## 2016-09-10 MED FILL — SUPREP BOWEL PREP KIT: 17.5-3.13-1 | 1 days supply | Qty: 354 | Fill #0

## 2016-09-10 NOTE — Telephone Encounter (Signed)
appt has been moved up to this Friday in the [facility].  Instructions updated with her mother.

## 2016-09-14 ENCOUNTER — Ambulatory Visit (AMBULATORY_SURGERY_CENTER): Payer: 59 | Admitting: Gastroenterology

## 2016-09-14 ENCOUNTER — Encounter: Payer: Self-pay | Admitting: Gastroenterology

## 2016-09-14 VITALS — BP 92/57 | HR 77 | Temp 98.2°F | Resp 11 | Ht 64.0 in | Wt 139.0 lb

## 2016-09-14 DIAGNOSIS — R112 Nausea with vomiting, unspecified: Secondary | ICD-10-CM

## 2016-09-14 DIAGNOSIS — R935 Abnormal findings on diagnostic imaging of other abdominal regions, including retroperitoneum: Secondary | ICD-10-CM | POA: Diagnosis not present

## 2016-09-14 DIAGNOSIS — R933 Abnormal findings on diagnostic imaging of other parts of digestive tract: Secondary | ICD-10-CM | POA: Diagnosis not present

## 2016-09-14 DIAGNOSIS — R109 Unspecified abdominal pain: Secondary | ICD-10-CM | POA: Diagnosis not present

## 2016-09-14 DIAGNOSIS — R1084 Generalized abdominal pain: Secondary | ICD-10-CM

## 2016-09-14 DIAGNOSIS — K2980 Duodenitis without bleeding: Secondary | ICD-10-CM | POA: Diagnosis not present

## 2016-09-14 DIAGNOSIS — K2970 Gastritis, unspecified, without bleeding: Secondary | ICD-10-CM | POA: Diagnosis not present

## 2016-09-14 MED ORDER — SODIUM CHLORIDE 0.9 % IV SOLN: 500.0000 mL | INTRAVENOUS | Status: DC

## 2016-09-14 NOTE — Patient Instructions (Signed)
YOU HAD AN ENDOSCOPIC PROCEDURE TODAY AT [facility]:   Refer to the procedure report that was given to you for any specific questions about what was found during the examination.  If the procedure report does not answer your questions, please call your gastroenterologist to clarify.  If you requested that your care partner not be given the details of your procedure findings, then the procedure report has been included in a sealed envelope for you to review at your convenience later.  YOU SHOULD EXPECT: Some feelings of bloating in the abdomen. Passage of more gas than usual.  Walking can help get rid of the air that was put into your GI tract during the procedure and reduce the bloating. If you had a lower endoscopy (such as a colonoscopy or flexible sigmoidoscopy) you may notice spotting of blood in your stool or on the toilet paper. If you underwent a bowel prep for your procedure, you may not have a normal bowel movement for a few days.  Please Note:  You might notice some irritation and congestion in your nose or some drainage.  This is from the oxygen used during your procedure.  There is no need for concern and it should clear up in a day or so.  SYMPTOMS TO REPORT IMMEDIATELY:   Following lower endoscopy (colonoscopy or flexible sigmoidoscopy):  Excessive amounts of blood in the stool  Significant tenderness or worsening of abdominal pains  Swelling of the abdomen that is new, acute  Fever of 100F or higher   Following upper endoscopy (EGD)  Vomiting of blood or coffee ground material  New chest pain or pain under the shoulder blades  Painful or persistently difficult swallowing  New shortness of breath  Fever of 100F or higher  Black, tarry-looking stools  For urgent or emergent issues, a gastroenterologist can be reached at any hour by calling [phone].   DIET:  We do recommend a small meal at first, but then you may proceed to your regular diet.  Drink  plenty of fluids but you should avoid alcoholic beverages for 24 hours.  ACTIVITY:  You should plan to take it easy for the rest of today and you should NOT DRIVE or use heavy machinery until tomorrow (because of the sedation medicines used during the test).    FOLLOW UP: Our staff will call the number listed on your records the next business day following your procedure to check on you and address any questions or concerns that you may have regarding the information given to you following your procedure. If we do not reach you, we will leave a message.  However, if you are feeling well and you are not experiencing any problems, there is no need to return our call.  We will assume that you have returned to your regular daily activities without incident.  If any biopsies were taken you will be contacted by phone or by letter within the next 1-3 weeks.  Please call us at [phone] if you have not heard about the biopsies in 3 weeks.    SIGNATURES/CONFIDENTIALITY: You and/or your care partner have signed paperwork which will be entered into your electronic medical record.  These signatures attest to the fact that that the information above on your After Visit Summary has been reviewed and is understood.  Full responsibility of the confidentiality of this discharge information lies with you and/or your care-partner.    You may resume your current medications today. Await biopsy  results. Please call if any questions or concerns.

## 2016-09-14 NOTE — Op Note (Signed)
[facility] Patient Name: Victoria Simmons Procedure Date: 09/14/2016 7:55 AM MRN: [id number] Endoscopist: Remo Lipps P. Vashti Bolanos MD, MD Age: 20 Referring MD:  Date of Birth: [birth year] Gender: Female Account #: [id number] Procedure:                Colonoscopy Indications:              Generalized abdominal pain, Abnormal CT of the GI                            tract (fat straning around rectum, rule out                            proctitis) Medicines:                Monitored Anesthesia Care Procedure:                Pre-Anesthesia Assessment:                           - Prior to the procedure, a History and Physical                            was performed, and patient medications and                            allergies were reviewed. The patient's tolerance of                            previous anesthesia was also reviewed. The risks                            and benefits of the procedure and the sedation                            options and risks were discussed with the patient.                            All questions were answered, and informed consent                            was obtained. Prior Anticoagulants: The patient has                            taken no previous anticoagulant or antiplatelet                            agents. ASA Grade Assessment: II - A patient with                            mild systemic disease. After reviewing the risks                            and benefits, the patient was deemed in  satisfactory condition to undergo the procedure.                           After obtaining informed consent, the colonoscope                            was passed under direct vision. Throughout the                            procedure, the patient's blood pressure, pulse, and                            oxygen saturations were monitored continuously. The                            Model PCF-H190L [id number]) scope was  introduced                            through the anus and advanced to the the terminal                            ileum, with identification of the appendiceal                            orifice and IC valve. The colonoscopy was performed                            without difficulty. The patient tolerated the                            procedure well. The quality of the bowel                            preparation was good. The terminal ileum, ileocecal                            valve, appendiceal orifice, and rectum were                            photographed. Scope In: 8:08:52 AM Scope Out: 8:20:41 AM Scope Withdrawal Time: 0 hours 9 minutes 10 seconds  Total Procedure Duration: 0 hours 11 minutes 49 seconds  Findings:                 The perianal and digital rectal examinations were                            normal.                           The terminal ileum appeared normal.                           The exam was otherwise without abnormality on  direct and retroflexion views. No inflammatory                            changes noted. Specifically no abnormality of the                            rectumto correlate to CT findings. Complications:            No immediate complications. Estimated blood loss:                            None. Estimated Blood Loss:     Estimated blood loss: none. Impression:               - The examined portion of the ileum was normal.                           - The examination was otherwise normal on direct                            and retroflexion views.                           Overall normal exam, no pathology noted to cause                            symptoms, no pathology noted to correlate to CT                            findings Recommendation:           - Patient has a contact number available for                            emergencies. The signs and symptoms of potential                            delayed  complications were discussed with the                            patient. Return to normal activities tomorrow.                            Written discharge instructions were provided to the                            patient.                           - Resume previous diet.                           - Continue present medications.                           - Recommendations per EGD Remo Lipps P. Shanea Karney MD, MD 09/14/2016 8:26:48 AM This report has been signed electronically.

## 2016-09-14 NOTE — Progress Notes (Signed)
A/ox3 pleased with MAC, report to Annette RN 

## 2016-09-14 NOTE — Progress Notes (Signed)
No problems noted in the recovery room. maw 

## 2016-09-14 NOTE — Op Note (Signed)
[facility] Patient Name: Victoria Simmons Procedure Date: 09/14/2016 7:56 AM MRN: [id number] Endoscopist: Remo Lipps P. Armbruster MD, MD Age: 20 Referring MD:  Date of Birth: [birth year] Gender: Female Account #: [id number] Procedure:                Upper GI endoscopy Indications:              Generalized abdominal pain, Vomiting Medicines:                Monitored Anesthesia Care Procedure:                Pre-Anesthesia Assessment:                           - Prior to the procedure, a History and Physical                            was performed, and patient medications and                            allergies were reviewed. The patient's tolerance of                            previous anesthesia was also reviewed. The risks                            and benefits of the procedure and the sedation                            options and risks were discussed with the patient.                            All questions were answered, and informed consent                            was obtained. Prior Anticoagulants: The patient has                            taken no previous anticoagulant or antiplatelet                            agents. ASA Grade Assessment: II - A patient with                            mild systemic disease. After reviewing the risks                            and benefits, the patient was deemed in                            satisfactory condition to undergo the procedure.                           After obtaining informed consent, the endoscope was  passed under direct vision. Throughout the                            procedure, the patient's blood pressure, pulse, and                            oxygen saturations were monitored continuously. The                            Model GIF-HQ190 [id number]) scope was introduced                            through the mouth, and advanced to the second part                            of  duodenum. The upper GI endoscopy was                            accomplished without difficulty. The patient                            tolerated the procedure well. Scope In: Scope Out: Findings:                 Esophagogastric landmarks were identified: the                            Z-line was found at 35 cm, the gastroesophageal                            junction was found at 35 cm and the upper extent of                            the gastric folds was found at 35 cm from the                            incisors.                           The exam of the esophagus was otherwise normal.                           The entire examined stomach was normal. Biopsies                            were taken with a cold forceps for Helicobacter                            pylori testing.                           Localized mildly erythematous mucosa without active                            bleeding and with no stigmata of  bleeding was found                            in the duodenal bulb. Biopsies were taken with a                            cold forceps for histology.                           The duodenal bulb and second portion of the                            duodenum were otherwise normal. Biopsies for                            histology were taken with a cold forceps for                            evaluation of celiac disease. Complications:            No immediate complications. Estimated blood loss:                            Minimal. Estimated Blood Loss:     Estimated blood loss was minimal. Impression:               - Esophagogastric landmarks identified.                           - Normal stomach. Biopsied.                           - Erythematous duodenopathy. Biopsied.                           - Normal duodenal bulb and second portion of the                            duodenum. Biopsied. Recommendation:           - Patient has a contact number available for                             emergencies. The signs and symptoms of potential                            delayed complications were discussed with the                            patient. Return to normal activities tomorrow.                            Written discharge instructions were provided to the                            patient.                           -  Resume previous diet.                           - Continue present medications.                           - Await pathology results. Remo Lipps P. Armbruster MD, MD 09/14/2016 8:30:01 AM This report has been signed electronically.

## 2016-09-14 NOTE — Progress Notes (Signed)
Called to room to assist during endoscopic procedure.  Patient ID and intended procedure confirmed with present staff. Received instructions for my participation in the procedure from the performing physician.Called to room to assist during endoscopic procedure.  Patient ID and intended procedure confirmed with present staff. Received instructions for my participation in the procedure from the performing physician. 

## 2016-09-17 ENCOUNTER — Telehealth: Payer: Self-pay | Admitting: *Deleted

## 2016-09-17 NOTE — Telephone Encounter (Signed)
  Follow up Call-  Call back number 09/14/2016  Post procedure Call Back phone  # [phone]  Permission to leave phone message Yes  Some recent data might be hidden     Patient questions:  Message left to call us if necessary.

## 2016-09-18 ENCOUNTER — Ambulatory Visit (INDEPENDENT_AMBULATORY_CARE_PROVIDER_SITE_OTHER): Payer: 59 | Admitting: Licensed Clinical Social Worker

## 2016-09-18 ENCOUNTER — Telehealth: Payer: Self-pay

## 2016-09-18 DIAGNOSIS — F411 Generalized anxiety disorder: Secondary | ICD-10-CM

## 2016-09-18 DIAGNOSIS — F331 Major depressive disorder, recurrent, moderate: Secondary | ICD-10-CM

## 2016-09-18 NOTE — Telephone Encounter (Signed)
  Follow up Call-  Call back number 09/14/2016  Post procedure Call Back phone  # [phone]  Permission to leave phone message Yes  Some recent data might be hidden    Patient was called for follow up after her procedure on 09/14/2016. I spoke with Victoria Simmons mother and she reports that [name] has returned to her normal daily activities without any complications following her procedure.

## 2016-09-21 NOTE — Progress Notes (Signed)
   THERAPIST PROGRESS NOTE  Session Time: 70min  Participation Level: Active  Behavioral Response: CasualAlertAnxious  Type of Therapy: Individual Therapy  Treatment Goals addressed: Coping and Diagnosis: Anxiety  Interventions: CBT, Motivational Interviewing, Solution Focused, Strength-based and Supportive  Summary: Victoria Simmons is a 20 y.o. female who presents with continued symptoms of her diagnosis.  Patient reports that she continues to feel anxious daily. She reports that she works at [employer].  Patient states that she works 2nd shift.  She states that when she wakes up in the morning she feels anxious all day if she has to work.  She reports that she goes to work daily but struggles with feeling calm.  Patient reports that her grades are poor.  Patient reports that she is currently on Academic Probation and if she has poor grades in Math then she will be suspended.  Patient reports that she has not declared a major in college and gets upset since all of her friends have.  Patient reports that her mother has anxiety and has learned how to calm herself down with medication and talk therapy.  Patient is open to time management group therapy.  Patient cried intermittently throughout the session and had one small bout of shortness of breath.  Patient reports that she will continue to try coping skills taught such as deep breathing, guided imagery, mood tracker app on her phone, and soft soothing sounds.   Suicidal/Homicidal: No  Therapist Response: LCSW provided Patient with ongoing emotional support and encouragement.  Normalized her feelings.  Commended Patient on her progress and reinforced the importance of client staying focused on her own strengths and resources and resiliency. Processed various strategies for dealing with stressors.    Plan: Return again in 1 weeks.  Diagnosis: Axis I: Generalized Anxiety Disorder    Axis II: No diagnosis    Lubertha South,  LCSW 09/21/2016

## 2016-09-27 ENCOUNTER — Encounter: Payer: Self-pay | Admitting: Psychiatry

## 2016-09-27 ENCOUNTER — Ambulatory Visit (INDEPENDENT_AMBULATORY_CARE_PROVIDER_SITE_OTHER): Payer: 59 | Admitting: Psychiatry

## 2016-09-27 VITALS — BP 109/74 | HR 72 | Temp 97.6°F | Wt 135.8 lb

## 2016-09-27 DIAGNOSIS — F411 Generalized anxiety disorder: Secondary | ICD-10-CM | POA: Diagnosis not present

## 2016-09-27 DIAGNOSIS — F332 Major depressive disorder, recurrent severe without psychotic features: Secondary | ICD-10-CM

## 2016-09-27 MED ORDER — TRAZODONE HCL 50 MG PO TABS: 50.0000 mg | ORAL_TABLET | Freq: Every day | ORAL | 1 refills | Status: DC

## 2016-09-27 MED FILL — traZODone HCL 50 MG TABS: 50 | 30 days supply | Qty: 30 | Fill #0

## 2016-09-27 NOTE — Progress Notes (Signed)
Psychiatric Initial Adult Assessment   Patient Identification: Victoria Simmons MRN:  [id number] Date of Evaluation:  09/27/2016 Referral Source: Dr.Aaron Chief Complaint:   Chief Complaint    Establish Care; Anxiety; Depression     Visit Diagnosis:    ICD-9-CM ICD-10-CM   1. Severe episode of recurrent major depressive disorder, without psychotic features ([place]) 296.33 F33.2   2. GAD (generalized anxiety disorder) 300.02 F41.1     History of Present Illness:  Patient is a 20 year old Caucasian girl seen today for an evaluation of depression and anxiety. Patient reports that she's been feeling very depressed for the past 5 months and it has gotten worse. She was initially at [employer] and has been placed on academic probation since she was unable to complete her courses because of anxiety and depression. She reports that most recently she is been feeling agitated and feeling worthless and hopeless. Reports that she has been crying quite a bit and patient became tearful during the session. She does endorse suicidal thoughts but without a plan. She has never engaged in  any type of self-harm behavior or suicidal attempts. States that she is afraid of pain and blood and would never do anything to hurt herself. Reports that she's not been able to sleep well and she has been feeling quite miserable for a while. Patient also describes significant anxiety and more so when she goes out into public. States that she becomes nervous and irritable and takes it out on her family. Reports that her depression and anxiety have made an impact on her relationship with her mom and her siblings. States that her source of support is her father though she loves all her family. They recently took a vacation to [place] and had a very good time. Patient denies any psychotic symptoms. She denies abuse of any kind. She denies use of drugs or alcohol. She is currently working at a [employer], [employer] and is taking this semester  off to get healthy. Patient was agreeable to calling her father into the session and discussing her treatment plan. Father reported that patient was started on Zoloft initially at 25 mg by her primary care physician and the saw an improvement in her anxiety and mood. Then they increased the dosage to 50 mg and at that dose it made her mood better but her anxiety continued to be problematic. At this point PCP tapered her off the Zoloft and she was sent for an evaluation to this clinician. Father agreeable to restarting patient on Zoloft and increasing dose to 100 mg.     Associated Signs/Symptoms: Depression Symptoms:  depressed mood, anhedonia, insomnia, psychomotor agitation, fatigue, feelings of worthlessness/guilt, hopelessness, suicidal thoughts without plan, anxiety, loss of energy/fatigue, decreased appetite, (Hypo) Manic Symptoms:  Distractibility, Irritable Mood, Anxiety Symptoms:  Excessive Worry, Psychotic Symptoms:  denies PTSD Symptoms: denies  Past Psychiatric History: Saw a psychologist twice earlier in the year.  Previous Psychotropic Medications: Yes , zoloft- stopped working at 50mg  , tried Prozac couple years ago but did not help.   Substance Abuse History in the last 12 months:  No.  Consequences of Substance Abuse: Negative  Past Medical History:  Past Medical History:  Diagnosis Date  . Ovarian torsion 2008    Past Surgical History:  Procedure Laterality Date  . OVARY SURGERY      Family Psychiatric History: Mother has anxiety  Family History:  Family History  Problem Relation Age of Onset  . Hepatitis B Mother   .  Anxiety disorder Mother   . Hypertension Father   . ADD / ADHD Brother     Social History:   Social History   Social History  . Marital status: Single    Spouse name: N/A  . Number of children: N/A  . Years of education: N/A   Social History Main Topics  . Smoking status: Never Smoker  . Smokeless tobacco: Never Used   . Alcohol use No  . Drug use: No  . Sexual activity: No   Other Topics Concern  . None   Social History Narrative   ** Merged History Encounter **        Additional Social History: Lives with biological parents and 20 yo brother and 3 yo sister.  Allergies:  No Known Allergies  Metabolic Disorder Labs: Lab Results  Component Value Date   HGBA1C 4.9 01/01/2013   No results found for: PROLACTIN No results found for: CHOL, TRIG, HDL, CHOLHDL, VLDL, LDLCALC   Current Medications: Current Outpatient Prescriptions  Medication Sig Dispense Refill  . SPRINTEC 28 0.25-35 MG-MCG tablet Take 1 tablet by mouth daily.  11   Current Facility-Administered Medications  Medication Dose Route Frequency Provider Last Rate Last Dose  . 0.9 %  sodium chloride infusion  500 mL Intravenous Continuous Manus Gunning, MD        Neurologic: Headache: No Seizure: No Paresthesias:No  Musculoskeletal: Strength & Muscle Tone: within normal limits Gait & Station: normal Patient leans: N/A  Psychiatric Specialty Exam: ROS  Blood pressure 109/74, pulse 72, temperature 97.6 F (36.4 C), temperature source Oral, weight 135 lb 12.8 oz (61.6 kg), last menstrual period 09/27/2016.Body mass index is 23.31 kg/m.  General Appearance: Casual  Eye Contact:  Fair  Speech:  Clear and Coherent  Volume:  Normal  Mood:  Anxious, Depressed and Dysphoric  Affect:  Constricted, Depressed and Flat  Thought Process:  Coherent  Orientation:  Full (Time, Place, and Person)  Thought Content:  WDL and Logical  Suicidal Thoughts:  Yes.  without intent/plan  Homicidal Thoughts:  No  Memory:  Immediate;   Fair Recent;   Fair Remote;   Fair  Judgement:  Fair  Insight:  Fair  Psychomotor Activity:  Normal  Concentration:  Concentration: Fair and Attention Span: Fair  Recall:  [employer] of Knowledge:Fair  Language: Fair  Akathisia:  No  Handed:  Right  AIMS (if indicated):  na  Assets:   Communication Skills Desire for Improvement Financial Resources/Insurance Physical Health Social Support Vocational/Educational  ADL's:  Intact  Cognition: WNL  Sleep:  disturbed    Treatment Plan Summary: Major Depressive disorder Start Zoloft at 25 mg daily for a week and then increase to 50 mg daily for a week and then increase to 100 mg. Patient and her father made aware of the side effects and benefits including black box warning of possible suicidal thoughts. Patient was on Zoloft previously and father and patient report that she did quite well though he did not address her anxiety at 50 mg. Patient recommended to start seeing a therapist after the holiday season and given a list of therapists.  Anxiety Name as above  Insomnia Start trazodone at 50 mg at bedtime and if patient finds it too sedating she can reduce it to 25 mg..  Return to clinic in 4 weeks time or call before if needed. Patient and father made aware that this clinician will be away for 4 weeks and a plan was discussed  for patient's treatment. They are aware that if patient has any suicidal thoughts they can call the crisis hotline and patient will need to be hospitalized at the behavioral health unit.   Elvin So, MD 12/21/20179:30 AM

## 2016-10-30 ENCOUNTER — Encounter: Payer: Self-pay | Admitting: Gastroenterology

## 2016-10-31 ENCOUNTER — Ambulatory Visit: Payer: 59 | Admitting: Psychiatry

## 2016-11-16 ENCOUNTER — Encounter: Payer: Self-pay | Admitting: Psychiatry

## 2016-11-16 ENCOUNTER — Ambulatory Visit (INDEPENDENT_AMBULATORY_CARE_PROVIDER_SITE_OTHER): Payer: 59 | Admitting: Psychiatry

## 2016-11-16 VITALS — BP 111/71 | HR 62 | Temp 97.9°F | Wt 133.8 lb

## 2016-11-16 DIAGNOSIS — F411 Generalized anxiety disorder: Secondary | ICD-10-CM

## 2016-11-16 DIAGNOSIS — F331 Major depressive disorder, recurrent, moderate: Secondary | ICD-10-CM | POA: Diagnosis not present

## 2016-11-16 MED ORDER — SERTRALINE HCL 100 MG PO TABS: 150.0000 mg | ORAL_TABLET | Freq: Every day | ORAL | 1 refills | Status: DC

## 2016-11-16 MED ORDER — TRAZODONE HCL 100 MG PO TABS: 100.0000 mg | ORAL_TABLET | Freq: Every day | ORAL | 1 refills | Status: DC

## 2016-11-16 MED FILL — traZODone HCL 100 MG TABS: 100 | 30 days supply | Qty: 30 | Fill #0

## 2016-11-16 MED FILL — SERTRALINE HCL 100 MG TAB: 100 | 30 days supply | Qty: 45 | Fill #0

## 2016-11-16 NOTE — Progress Notes (Signed)
Psychiatric Initial Adult Assessment   Patient Identification: Victoria Simmons MRN:  [id number] Date of Evaluation:  11/16/2016 Referral Source: Dr.Aaron Chief Complaint:   Chief Complaint    Follow-up; Medication Refill     Visit Diagnosis:    ICD-9-CM ICD-10-CM   1. GAD (generalized anxiety disorder) 300.02 F41.1   2. Moderate episode of recurrent major depressive disorder (HCC) 296.32 F33.1     History of Present Illness: Patient was seen today for follow-up of depression and anxiety. She reports that since increasing the Zoloft  To 100 mg her mood has improved significantly but she continues to feel highly anxious. 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 She reports that the social interaction as needed for her job at very stressful to her. She is trying to look for other jobs but is limited by not having a driving license. Reports that the trazodone has been helping her to fall asleep but she has difficulty staying asleep. Father reports that since increasing the Zoloft 200 mg had had acute has improved significantly. He reports that they have seen an improvement in her depression. Patient continues to endorse vague suicidal thoughts but states that she has no plan and will not do anything to hurt herself. Denies any self-harm behaviors.  Past Psychiatric History: Saw a psychologist twice earlier in the year.  Previous Psychotropic Medications: Yes , zoloft- stopped working at 50mg  , tried Prozac couple years ago but did not help.   Substance Abuse History in the last 12 months:  No.  Consequences of Substance Abuse: Negative  Past Medical History:  Past Medical History:  Diagnosis Date  . Ovarian torsion 2008    Past Surgical History:  Procedure Laterality Date  . OVARY SURGERY      Family Psychiatric History: Mother has anxiety  Family History:  Family History  Problem Relation Age of Onset  . Hepatitis B Mother   . Anxiety disorder Mother   .  Hypertension Father   . ADD / ADHD Brother     Social History:   Social History   Social History  . Marital status: Single    Spouse name: N/A  . Number of children: N/A  . Years of education: N/A   Social History Main Topics  . Smoking status: Never Smoker  . Smokeless tobacco: Never Used  . Alcohol use No  . Drug use: No  . Sexual activity: No   Other Topics Concern  . None   Social History Narrative   ** Merged History Encounter **        Additional Social History: Lives with biological parents and 10 yo brother and 52 yo sister.  Allergies:  No Known Allergies  Metabolic Disorder Labs: Lab Results  Component Value Date   HGBA1C 4.9 01/01/2013   No results found for: PROLACTIN No results found for: CHOL, TRIG, HDL, CHOLHDL, VLDL, LDLCALC   Current Medications: Current Outpatient Prescriptions  Medication Sig Dispense Refill  . sertraline (ZOLOFT) 50 MG tablet Take 50 mg by mouth daily.    . SPRINTEC 28 0.25-35 MG-MCG tablet Take 1 tablet by mouth daily.  11  . traZODone (DESYREL) 50 MG tablet Take 1 tablet (50 mg total) by mouth at bedtime. 30 tablet 1   Current Facility-Administered Medications  Medication Dose Route Frequency Provider Last Rate Last Dose  . 0.9 %  sodium chloride infusion  500 mL Intravenous Continuous Manus Gunning, MD  Neurologic: Headache: No Seizure: No Paresthesias:No  Musculoskeletal: Strength & Muscle Tone: within normal limits Gait & Station: normal Patient leans: N/A  Psychiatric Specialty Exam: ROS  Blood pressure 111/71, pulse 62, temperature 97.9 F (36.6 C), temperature source Oral, weight 133 lb 12.8 oz (60.7 kg), last menstrual period 10/22/2016.Body mass index is 22.97 kg/m.  General Appearance: Casual  Eye Contact:  Fair  Speech:  Clear and Coherent  Volume:  Normal  Mood:  Improved   Affect:  Constricted, Depressed and Flat  Thought Process:  Coherent  Orientation:  Full (Time, Place, and  Person)  Thought Content:  WDL and Logical  Suicidal Thoughts:  Yes.  without intent/plan  Homicidal Thoughts:  No  Memory:  Immediate;   Fair Recent;   Fair Remote;   Fair  Judgement:  Fair  Insight:  Fair  Psychomotor Activity:  Normal  Concentration:  Concentration: Fair and Attention Span: Fair  Recall:  [employer] of Knowledge:Fair  Language: Fair  Akathisia:  No  Handed:  Right  AIMS (if indicated):  na  Assets:  Communication Skills Desire for Improvement Financial Resources/Insurance Physical Health Social Support Vocational/Educational  ADL's:  Intact  Cognition: WNL  Sleep:  disturbed    Treatment Plan Summary: Major Depressive disorder Increase Zoloft to 150 mg once daily. Patient aware of side effects and benefits. Restart therapy with Royal Piedra and started to address her social anxiety issues  Anxiety same as above Patient recommended to exercise daily and discussed the importance of farm good diet and exercise to help with anxiety and mood  Insomnia Increase trazodone to 100 mg at bedtime Discussed CBT therapy on line to help with insomnia and also discussed the sleepio module available online. Return to clinic in 2 weeks time or call before if needed. Patient and father made aware that this clinician will be away for 4 weeks and a plan was discussed for patient's treatment. They are aware that if patient has any suicidal thoughts they can call the crisis hotline and patient will need to be hospitalized at the behavioral health unit.   Elvin So, MD 2/9/20188:48 AM

## 2016-11-22 ENCOUNTER — Ambulatory Visit (INDEPENDENT_AMBULATORY_CARE_PROVIDER_SITE_OTHER): Payer: 59 | Admitting: Licensed Clinical Social Worker

## 2016-11-22 DIAGNOSIS — F411 Generalized anxiety disorder: Secondary | ICD-10-CM | POA: Diagnosis not present

## 2016-11-29 ENCOUNTER — Encounter: Payer: Self-pay | Admitting: Psychiatry

## 2016-11-29 ENCOUNTER — Ambulatory Visit (INDEPENDENT_AMBULATORY_CARE_PROVIDER_SITE_OTHER): Payer: 59 | Admitting: Psychiatry

## 2016-11-29 VITALS — BP 104/65 | HR 76 | Temp 97.7°F | Wt 129.4 lb

## 2016-11-29 DIAGNOSIS — F331 Major depressive disorder, recurrent, moderate: Secondary | ICD-10-CM

## 2016-11-29 DIAGNOSIS — F411 Generalized anxiety disorder: Secondary | ICD-10-CM | POA: Diagnosis not present

## 2016-11-29 NOTE — Progress Notes (Signed)
Psychiatric progress note  Patient Identification: Victoria Simmons MRN:  [id number] Date of Evaluation:  11/29/2016 Referral Source: Dr.Aaron Chief Complaint:   Chief Complaint    Follow-up; Medication Refill     Visit Diagnosis:    ICD-9-CM ICD-10-CM   1. GAD (generalized anxiety disorder) 300.02 F41.1   2. Moderate episode of recurrent major depressive disorder (HCC) 296.32 F33.1     History of Present Illness: Patient was seen today for follow-up of depression and anxiety. She reports doing quite well on the increased dose of Zoloft at 150 mg. States that she is tolerating it well and has seen a significant improvement in her anxiety. States that the higher dose of trazodone at 100 mg has also been helping her sleep well. States that she no longer feels anxious going to work or being at work. Reports that she is not been eating as well as the last 2-3 days but ate quite a bit last week when she had her periods. Denies any suicidal thoughts. Continues to have significant anxiety around driving her car and states that that's her goal now is to slowly start [place] to the church. Reports she has been exercising a bit more. Overall patient has made significant progress   Past Psychiatric History: Saw a psychologist twice earlier in the year.  Previous Psychotropic Medications: Yes , zoloft- stopped working at 50mg  , tried Prozac couple years ago but did not help.   Substance Abuse History in the last 12 months:  No.  Consequences of Substance Abuse: Negative  Past Medical History:  Past Medical History:  Diagnosis Date  . Ovarian torsion 2008    Past Surgical History:  Procedure Laterality Date  . OVARY SURGERY      Family Psychiatric History: Mother has anxiety  Family History:  Family History  Problem Relation Age of Onset  . Hepatitis B Mother   . Anxiety disorder Mother   . Hypertension Father   . ADD / ADHD Brother     Social History:   Social History    Social History  . Marital status: Single    Spouse name: N/A  . Number of children: N/A  . Years of education: N/A   Social History Main Topics  . Smoking status: Never Smoker  . Smokeless tobacco: Never Used  . Alcohol use No  . Drug use: No  . Sexual activity: No   Other Topics Concern  . None   Social History Narrative   ** Merged History Encounter **        Additional Social History: Lives with biological parents and 75 yo brother and 34 yo sister.  Allergies:  No Known Allergies  Metabolic Disorder Labs: Lab Results  Component Value Date   HGBA1C 4.9 01/01/2013   No results found for: PROLACTIN No results found for: CHOL, TRIG, HDL, CHOLHDL, VLDL, LDLCALC   Current Medications: Current Outpatient Prescriptions  Medication Sig Dispense Refill  . sertraline (ZOLOFT) 100 MG tablet Take 1.5 tablets (150 mg total) by mouth daily. 45 tablet 1  . SPRINTEC 28 0.25-35 MG-MCG tablet Take 1 tablet by mouth daily.  11  . traZODone (DESYREL) 100 MG tablet Take 1 tablet (100 mg total) by mouth at bedtime. 30 tablet 1   Current Facility-Administered Medications  Medication Dose Route Frequency Provider Last Rate Last Dose  . 0.9 %  sodium chloride infusion  500 mL Intravenous Continuous Manus Gunning, MD        Neurologic: Headache: No  Seizure: No Paresthesias:No  Musculoskeletal: Strength & Muscle Tone: within normal limits Gait & Station: normal Patient leans: N/A  Psychiatric Specialty Exam: ROS  Blood pressure 104/65, pulse 76, temperature 97.7 F (36.5 C), temperature source Oral, weight 129 lb 6.4 oz (58.7 kg), last menstrual period 11/22/2016.Body mass index is 22.21 kg/m.  General Appearance: Casual  Eye Contact:  Fair  Speech:  Clear and Coherent  Volume:  Normal  Mood:  Improved   Affect:  Smiling and pleasant   Thought Process:  Coherent  Orientation:  Full (Time, Place, and Person)  Thought Content:  WDL and Logical  Suicidal  Thoughts:  Denies   Homicidal Thoughts:  No  Memory:  Immediate;   Fair Recent;   Fair Remote;   Fair  Judgement:  Fair  Insight:  Fair  Psychomotor Activity:  Normal  Concentration:  Concentration: Fair and Attention Span: Fair  Recall:  [employer] of Knowledge:Fair  Language: Fair  Akathisia:  No  Handed:  Right  AIMS (if indicated):  na  Assets:  Communication Skills Desire for Improvement Financial Resources/Insurance Physical Health Social Support Vocational/Educational  ADL's:  Intact  Cognition: WNL  Sleep:  Much improved     Treatment Plan Summary: Major Depressive disorder Continue Zoloft at 150 mg once daily. Patient aware of side effects and benefits. Continue therapy with Royal Piedra and  address her social anxiety issues  Anxiety same as above Patient recommended to exercise daily and discussed the importance of farm good diet and exercise to help with anxiety and mood  Insomnia Decrease trazodone to 50 mg at bedtime    Elvin So, MD 2/22/201810:03 AM

## 2016-11-29 NOTE — Progress Notes (Signed)
   THERAPIST PROGRESS NOTE  Session Time: 63min  Participation Level: Active  Behavioral Response: CasualAlertAnxious  Type of Therapy: Individual Therapy  Treatment Goals addressed: Coping and Diagnosis: Anxiety  Interventions: CBT, Motivational Interviewing, Solution Focused, Strength-based and Supportive  Summary: Victoria Simmons is a 21 y.o. female who presents with continued symptoms of her diagnosis. Patient reports that she has experienced a favorable mood the past two weeks. Patient reports that she has changed medication dosage and she now has the motivation to complete each task given daily.  She reports that she is no longer enrolled at [employer] due to grades.  SHe reports that she is on academic suspension for a semester.  She reports that she will enroll at [employer].  Patient was able to complete "Stress/Coping Skills Curve" to ensure she is aware of triggers and coping skills.  Therapist drew curve and wrote triggers and coping skills as Patient verbally listed them.  Patient was able to visualize how triggers build if effective strategies are not used.   Suicidal/Homicidal: No  Therapist Response: Assessed pt current functioning per pt report.  Explored w/ pt contributing anxiety.  Validated feelings and stressors and focused pt on using skills for coping.   Plan: Return again in 2 weeks.  Diagnosis: Axis I: Generalized Anxiety Disorder    Axis II: No diagnosis    Lubertha South, LCSW 11/23/2016

## 2016-12-02 ENCOUNTER — Telehealth: Payer: 59 | Admitting: Family

## 2016-12-02 DIAGNOSIS — J111 Influenza due to unidentified influenza virus with other respiratory manifestations: Secondary | ICD-10-CM | POA: Diagnosis not present

## 2016-12-02 MED ORDER — OSELTAMIVIR PHOSPHATE 75 MG PO CAPS: 75.0000 mg | ORAL_CAPSULE | Freq: Two times a day (BID) | ORAL | 0 refills | Status: DC

## 2016-12-02 NOTE — Progress Notes (Signed)

## 2016-12-03 ENCOUNTER — Telehealth: Payer: Self-pay

## 2016-12-03 NOTE — Telephone Encounter (Signed)
PLEASE NOTE: All timestamps contained within this report are represented as [place] Standard Time. CONFIDENTIALTY NOTICE: This fax transmission is intended only for the addressee. It contains information that is legally privileged, confidential or otherwise protected from use or disclosure. If you are not the intended recipient, you are strictly prohibited from reviewing, disclosing, copying using or disseminating any of this information or taking any action in reliance on or regarding this information. If you have received this fax in error, please notify us immediately by telephone so that we can arrange for its return to [name]. Phone: [phone], Toll-Free: [phone], Fax: [phone] Page: 1 of 2 Call Id: [id number] [place] Patient Name: Victoria Simmons Gender: Female DOB: 03-04-1996 Age: 21 Y 3 M 9 D Return Phone Number: [phone] (Primary), [phone] (Secondary) City/State/Zip: [place] Night - Client Client Site [facility] - Night Physician Arnette Norris - MD Who Is Calling Patient / Member / Family / Caregiver Call Type Triage / Clinical Caller Name Dawn Relationship To Patient Mother Return Phone Number [phone] (Primary) Chief Complaint Headache Reason for Call Symptomatic / Request for Health Information Initial Comment Callre's dtr has a 100.4 fever, achy, congested, and headache. Nurse Assessment Nurse: Christel Mormon, RN, Levada Dy Date/Time Eilene Ghazi Time): 12/02/2016 1:51:04 PM Confirm and document reason for call. If symptomatic, describe symptoms. ---Caller states dtr had a temp of 100.3 but down to 99 now. She has been achy, congested, and headache on the top of her head and forehead. Does the PT have any chronic conditions? (i.e. diabetes, asthma, etc.) ---No Is the patient pregnant or possibly pregnant? (Ask all  females between the ages of 70-55) ---No Guidelines Guideline Title Affirmed Question Sinus Pain or Congestion [1] Sinus pain (not just congestion) AND [2] fever Disp. Time Eilene Ghazi Time) Disposition Final User 12/02/2016 2:03:54 PM See Physician within 24 Hours Yes [place], RN, Levada Dy Referrals REFERRED TO PCP OFFICE Care Advice Given Per Guideline FOR A STUFFY NOSE - USE NASAL WASHES: * Introduction: Saline (salt water) nasal irrigation (nasal wash) is an effective and simple home remedy for treating stuffy nose and sinus congestion. The nose can be irrigated by pouring, spraying, or squirting salt water into the nose and then letting it run back out. SEE PHYSICIAN WITHIN 24 HOURS: * How it Helps: The salt water rinses out excess mucus, washes out any irritants (dust, allergens) that might be present, and moistens the nasal cavity. * Methods: There are several ways to perform nasal irrigation. You can use a saline nasal spray bottle (available over-the-counter), a rubber ear syringe, a medical syringe without the needle, or a NETI POT. STEP-BY-STEP INSTRUCTIONS: * STEP 1: Lean over a sink. * STEP 2: Gently squirt or spray warm salt water into one of your nostrils. * STEP 3: Some of the water may run into the back of your throat. Spit this out. If you swallow the salt water it will not hurt you. * OXYMETAZOLINE NASAL DROPS (Afrin) are available OTC. Clean out the nose before using. Spray each nostril once, wait one minute for absorption, and then spray a second time. * PHENYLEPHRINE NASAL DROPS (Neo-Synephrine) are available OTC. Clean out the nose before using. Spray each nostril once, wait one minute for absorption, and then spray a second time. * PHENYLEPHRINE (Sudafed PE) is available OTC in pill form. Typical adult dosage is one 10 mg tablet every 4 hours. *  PSEUDOEPHEDRINE (Sudafed) is available OTC in pill form. Typical PLEASE NOTE: All timestamps contained within this report are  represented as [place] Standard Time. CONFIDENTIALTY NOTICE: This fax transmission is intended only for the addressee. It contains information that is legally privileged, confidential or otherwise protected from use or disclosure. If you are not the intended recipient, you are strictly prohibited from reviewing, disclosing, copying using or disseminating any of this information or taking any action in reliance on or regarding this information. If you have received this fax in error, please notify us immediately by telephone so that we can arrange for its return to [name]. Phone: [phone], Toll-Free: [phone], Fax: [phone] Page: 2 of 2 Call Id: [id number] Care Advice Given Per Guideline adult dosage is two 30 mg tablets every 6 hours. * Most people do NOT need to use these medicines. If your nose feels blocked, you should try using nasal washes first. * If you have a very stuffy nose, nasal decongestant medicines can shrink the swollen nasal mucosa and allow for easier breathing. If you have a very runny nose, these medicines can reduce the amount of drainage. They may be taken as pills by mouth or as a nasal spray. NASAL DECONGESTANTS FOR A VERY STUFFY NOSE: PAIN MEDICINES: * For pain relief, take acetaminophen, ibuprofen, or naproxen. * Use the lowest amount that makes your pain feel better. CALL BACK IF: * Difficulty breathing (and not relieved by cleaning out nose) * You become worse. CARE ADVICE given per Sinus Pain or Congestion (Adult) guideline.

## 2016-12-03 NOTE — Telephone Encounter (Signed)
Pt did evisit on 12/02/16.

## 2016-12-17 ENCOUNTER — Ambulatory Visit: Payer: 59 | Admitting: Licensed Clinical Social Worker

## 2016-12-24 ENCOUNTER — Ambulatory Visit (INDEPENDENT_AMBULATORY_CARE_PROVIDER_SITE_OTHER): Payer: 59 | Admitting: Licensed Clinical Social Worker

## 2016-12-24 DIAGNOSIS — F411 Generalized anxiety disorder: Secondary | ICD-10-CM | POA: Diagnosis not present

## 2016-12-25 MED FILL — traZODone HCL 100 MG TABS: 100 | 30 days supply | Qty: 30 | Fill #1

## 2016-12-25 MED FILL — SERTRALINE HCL 100 MG TAB: 100 | 30 days supply | Qty: 45 | Fill #1

## 2016-12-27 ENCOUNTER — Ambulatory Visit (INDEPENDENT_AMBULATORY_CARE_PROVIDER_SITE_OTHER): Payer: 59 | Admitting: Psychiatry

## 2016-12-27 ENCOUNTER — Encounter: Payer: Self-pay | Admitting: Psychiatry

## 2016-12-27 VITALS — BP 95/64 | HR 69 | Temp 97.7°F | Wt 128.2 lb

## 2016-12-27 DIAGNOSIS — F331 Major depressive disorder, recurrent, moderate: Secondary | ICD-10-CM | POA: Diagnosis not present

## 2016-12-27 DIAGNOSIS — F411 Generalized anxiety disorder: Secondary | ICD-10-CM

## 2016-12-27 MED ORDER — SERTRALINE HCL 100 MG PO TABS: 150.0000 mg | ORAL_TABLET | Freq: Every day | ORAL | 1 refills | Status: DC

## 2016-12-27 MED ORDER — TRAZODONE HCL 100 MG PO TABS: 100.0000 mg | ORAL_TABLET | Freq: Every day | ORAL | 1 refills | Status: DC

## 2016-12-27 NOTE — Progress Notes (Signed)
   THERAPIST PROGRESS NOTE  Session Time: 42min  Participation Level: Active  Behavioral Response: CasualAlertAnxious  Type of Therapy: Individual Therapy  Treatment Goals addressed: Coping and Diagnosis: Anxiety  Interventions: CBT, Motivational Interviewing, Solution Focused, Strength-based and Supportive  Summary: Victoria Simmons is a 21 y.o. female who presents with continued symptoms of her diagnosis. Patient continues to report marked improvement in her anxiety.  Patient is able to socialize and work without having anxiety attacks.  Patient reports continued stress but she reports having skills to manage.  Review of previous session of "Bell Curve" with symptoms vs. Coping skills.  Patient reports that she continues to add coping strategies to her list.  Explored problem solving skills as related to her education.  Assisted her with reconsidering attending college with her ability to reduce her stressors.  Discussion of attending part time.  Explored problem solving strategies and how to conclude with a decision.   Suicidal/Homicidal: No  Therapist Response: LCSW provided Patient with ongoing emotional support and encouragement.  Normalized her feelings.  Commended Patient on her progress and reinforced the importance of client staying focused on her own strengths and resources and resiliency. Processed various strategies for dealing with stressors.     Plan: Return again in 2 weeks.  Diagnosis: Axis I: Generalized Anxiety Disorder    Axis II: No diagnosis    Lubertha South, LCSW 12/26/2016

## 2016-12-27 NOTE — Progress Notes (Signed)
Psychiatric progress note  Patient Identification: Victoria Simmons MRN:  [id number] Date of Evaluation:  12/27/2016 Referral Source: Dr.Aaron Chief Complaint:   Chief Complaint    Follow-up; Medication Refill     Visit Diagnosis:    ICD-9-CM ICD-10-CM   1. GAD (generalized anxiety disorder) 300.02 F41.1   2. Moderate episode of recurrent major depressive disorder (HCC) 296.32 F33.1     History of Present Illness: Patient was seen today for follow-up of depression and anxiety. She reports doing well. States she is doing well at her work and feels very comfortable. Tolerating her medications well and denies any side effects. Denies any suicidal thoughts. Continues to have significant anxiety around driving her car, she has started to see Royal Piedra and working on her anxiety around this. States it has been quite helpful.    Past Psychiatric History: Saw a psychologist twice earlier in the year.  Previous Psychotropic Medications: Yes , zoloft- stopped working at 21mg  , tried Prozac couple years ago but did not help.   Substance Abuse History in the last 12 months:  No.  Consequences of Substance Abuse: Negative  Past Medical History:  Past Medical History:  Diagnosis Date  . Ovarian torsion 2008    Past Surgical History:  Procedure Laterality Date  . OVARY SURGERY      Family Psychiatric History: Mother has anxiety  Family History:  Family History  Problem Relation Age of Onset  . Hepatitis B Mother   . Anxiety disorder Mother   . Hypertension Father   . ADD / ADHD Brother     Social History:   Social History   Social History  . Marital status: Single    Spouse name: N/A  . Number of children: N/A  . Years of education: N/A   Social History Main Topics  . Smoking status: Never Smoker  . Smokeless tobacco: Never Used  . Alcohol use No  . Drug use: No  . Sexual activity: No   Other Topics Concern  . None   Social History Narrative   ** Merged  History Encounter **        Additional Social History: Lives with biological parents and 21 yo brother and 50 yo sister.  Allergies:  No Known Allergies  Metabolic Disorder Labs: Lab Results  Component Value Date   HGBA1C 4.9 01/01/2013   No results found for: PROLACTIN No results found for: CHOL, TRIG, HDL, CHOLHDL, VLDL, LDLCALC   Current Medications: Current Outpatient Prescriptions  Medication Sig Dispense Refill  . oseltamivir (TAMIFLU) 75 MG capsule Take 1 capsule (75 mg total) by mouth 2 (two) times daily. 10 capsule 0  . sertraline (ZOLOFT) 100 MG tablet Take 1.5 tablets (150 mg total) by mouth daily. 45 tablet 1  . SPRINTEC 28 0.25-35 MG-MCG tablet Take 1 tablet by mouth daily.  11  . traZODone (DESYREL) 100 MG tablet Take 1 tablet (100 mg total) by mouth at bedtime. 30 tablet 1   Current Facility-Administered Medications  Medication Dose Route Frequency Provider Last Rate Last Dose  . 0.9 %  sodium chloride infusion  500 mL Intravenous Continuous Manus Gunning, MD        Neurologic: Headache: No Seizure: No Paresthesias:No  Musculoskeletal: Strength & Muscle Tone: within normal limits Gait & Station: normal Patient leans: N/A  Psychiatric Specialty Exam: ROS  Blood pressure 95/64, pulse 69, temperature 97.7 F (36.5 C), temperature source Oral, weight 128 lb 3.2 oz (58.2 kg), last menstrual period 12/18/2016.Body  mass index is 22.01 kg/m.  General Appearance: Casual  Eye Contact:  Fair  Speech:  Clear and Coherent  Volume:  Normal  Mood:  Improved   Affect:  Smiling and pleasant   Thought Process:  Coherent  Orientation:  Full (Time, Place, and Person)  Thought Content:  WDL and Logical  Suicidal Thoughts:  Denies   Homicidal Thoughts:  No  Memory:  Immediate;   Fair Recent;   Fair Remote;   Fair  Judgement:  Fair  Insight:  Fair  Psychomotor Activity:  Normal  Concentration:  Concentration: Fair and Attention Span: Fair  Recall:  [employer] of Knowledge:Fair  Language: Fair  Akathisia:  No  Handed:  Right  AIMS (if indicated):  na  Assets:  Communication Skills Desire for Improvement Financial Resources/Insurance Physical Health Social Support Vocational/Educational  ADL's:  Intact  Cognition: WNL  Sleep:  Much improved     Treatment Plan Summary: Major Depressive disorder Continue Zoloft at 150 mg once daily. Patient aware of side effects and benefits. Continue therapy with Royal Piedra and Work on anxiety surrounding her driving a car and other social issues.  Anxiety same as above Patient recommended to exercise daily and discussed the importance of farm good diet and exercise to help with anxiety and mood  Insomnia Continue trazodone at 100 mg at bedtime  Return to clinic in 2 months time or call before if needed.   Elvin So, MD 3/22/201810:22 AM

## 2017-01-07 ENCOUNTER — Ambulatory Visit (INDEPENDENT_AMBULATORY_CARE_PROVIDER_SITE_OTHER): Payer: 59 | Admitting: Licensed Clinical Social Worker

## 2017-01-07 DIAGNOSIS — F411 Generalized anxiety disorder: Secondary | ICD-10-CM

## 2017-01-07 DIAGNOSIS — F331 Major depressive disorder, recurrent, moderate: Secondary | ICD-10-CM

## 2017-01-08 NOTE — Progress Notes (Signed)
   THERAPIST PROGRESS NOTE  Session Time: 77min  Participation Level: Active  Behavioral Response: CasualAlertAnxious  Type of Therapy: Individual Therapy  Treatment Goals addressed: Coping and Diagnosis: Anxiety  Interventions: CBT, Motivational Interviewing, Solution Focused, Strength-based and Supportive  Summary: Victoria Simmons is a 21 y.o. female who presents with continued symptoms of her diagnosis. Patient continues to report marked improvement in her anxiety but an increase in low self worth. While discussing her current negative thoughts she began to cry.  Patient reports discord in the home which aids in her depressed mood.  Discussion of coping skills and strategies that may assist with a decrease in symptoms.  Patient is able to identify positive people in her life and extended family that are supportive and encouraging.  Discussion of how thoughts & feelings dictate behavior and behavior dictates actions.   Suicidal/Homicidal: No  Therapist Response: LCSW provided Patient with ongoing emotional support and encouragement.  Normalized her feelings.  Commended Patient on her progress and reinforced the importance of client staying focused on her own strengths and resources and resiliency. Processed various strategies for dealing with stressors.     Plan: Return again in 2 weeks.  Diagnosis: Axis I: Generalized Anxiety Disorder    Axis II: No diagnosis    Lubertha South, LCSW 01/08/2017

## 2017-01-24 MED FILL — SERTRALINE HCL 100 MG TAB: 100 | 30 days supply | Qty: 45 | Fill #0

## 2017-01-24 MED FILL — traZODone HCL 100 MG TABS: 100 | 30 days supply | Qty: 30 | Fill #0

## 2017-01-28 ENCOUNTER — Ambulatory Visit (INDEPENDENT_AMBULATORY_CARE_PROVIDER_SITE_OTHER): Payer: 59 | Admitting: Licensed Clinical Social Worker

## 2017-01-28 DIAGNOSIS — F411 Generalized anxiety disorder: Secondary | ICD-10-CM | POA: Diagnosis not present

## 2017-01-29 NOTE — Progress Notes (Signed)
   THERAPIST PROGRESS NOTE  Session Time: 50min  Participation Level: Active  Behavioral Response: CasualAlertAnxious  Type of Therapy: Individual Therapy  Treatment Goals addressed: Coping and Diagnosis: Anxiety  Interventions: CBT, Motivational Interviewing, Solution Focused, Strength-based and Supportive  Summary: [name] is a 21 y.o. female who presents with continued symptoms of her diagnosis. Patient reports that her anxiety is creeping back up on her.  She reports that she may need a medication change.  Discussion on medication and therapy balance.  Discussion on the use of coping skills and how she is managing her anxiety.  Listed triggers in her life and how to eliminate them one at a time.  Discussion of the homework that was given the last session.  Patient reports that she has been working on it but not completely completed.  Patient reports that she is able to get in the car and drive a short distance.  Discussion of faulty thinking. Name it & destroy it   Suicidal/Homicidal: No  Therapist Response: LCSW provided Patient with ongoing emotional support and encouragement.  Normalized her feelings.  Commended Patient on her progress and reinforced the importance of client staying focused on her own strengths and resources and resiliency. Processed various strategies for dealing with stressors.     Plan: Return again in 2 weeks.  Diagnosis: Axis I: Generalized Anxiety Disorder    Axis II: No diagnosis    Lubertha South, LCSW 01/29/2017

## 2017-02-18 ENCOUNTER — Ambulatory Visit: Payer: 59 | Admitting: Licensed Clinical Social Worker

## 2017-02-26 ENCOUNTER — Ambulatory Visit (INDEPENDENT_AMBULATORY_CARE_PROVIDER_SITE_OTHER): Payer: 59 | Admitting: Psychiatry

## 2017-02-26 ENCOUNTER — Encounter: Payer: Self-pay | Admitting: Psychiatry

## 2017-02-26 VITALS — BP 97/63 | HR 65 | Temp 98.0°F | Wt 130.0 lb

## 2017-02-26 DIAGNOSIS — F331 Major depressive disorder, recurrent, moderate: Secondary | ICD-10-CM

## 2017-02-26 DIAGNOSIS — F411 Generalized anxiety disorder: Secondary | ICD-10-CM | POA: Diagnosis not present

## 2017-02-26 MED ORDER — TRAZODONE HCL 100 MG PO TABS: 100.0000 mg | ORAL_TABLET | Freq: Every day | ORAL | 1 refills | Status: DC

## 2017-02-26 MED ORDER — SERTRALINE HCL 100 MG PO TABS: 150.0000 mg | ORAL_TABLET | Freq: Every day | ORAL | 1 refills | Status: DC

## 2017-02-26 MED FILL — SERTRALINE HCL 100 MG TAB: 100 | 30 days supply | Qty: 45 | Fill #0

## 2017-02-26 MED FILL — traZODone HCL 100 MG TABS: 100 | 30 days supply | Qty: 30 | Fill #0

## 2017-02-26 NOTE — Progress Notes (Signed)
Psychiatric progress note  Patient Identification: Victoria Simmons MRN:  [id number] Date of Evaluation:  02/26/2017 Referral Source: Dr.Aaron Chief Complaint:  Doing well  Chief Complaint    Follow-up; Medication Refill     Visit Diagnosis:    ICD-9-CM ICD-10-CM   1. GAD (generalized anxiety disorder) 300.02 F41.1   2. Moderate episode of recurrent major depressive disorder (HCC) 296.32 F33.1     History of Present Illness: Patient was seen today for follow-up of depression and anxiety. She reports doing Quite well. States that her anxiety has improved significantly. She is enjoying her work and works about 20-25 hours each week. 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 She is sleeping well and eating well. Continues to be compliant on her medications. States that she does feel somewhat tired on the Zoloft but it`s benefits far outweigh any side effects. Denies any suicidal thoughts. She reports making strides in her driving. States that she has been driving near her home mostly on the driving and making 3 point turns. States that she is proud of her accomplishments since last year she could not even sit in the car. States she is quite happy with her overall progress. Past Psychiatric History: Saw a psychologist twice earlier in the year.  Previous Psychotropic Medications: Yes , zoloft- stopped working at 50mg  , tried Prozac couple years ago but did not help.   Substance Abuse History in the last 12 months:  No.  Consequences of Substance Abuse: Negative  Past Medical History:  Past Medical History:  Diagnosis Date  . Ovarian torsion 2008    Past Surgical History:  Procedure Laterality Date  . OVARY SURGERY      Family Psychiatric History: Mother has anxiety  Family History:  Family History  Problem  Relation Age of Onset  . Hepatitis B Mother   . Anxiety disorder Mother   . Hypertension Father   . ADD / ADHD Brother     Social History:   Social History   Social History  . Marital status: Single    Spouse name: N/A  . Number of children: N/A  . Years of education: N/A   Social History Main Topics  . Smoking status: Never Smoker  . Smokeless tobacco: Never Used  . Alcohol use No  . Drug use: No  . Sexual activity: No   Other Topics Concern  . None   Social History Narrative   ** Merged History Encounter **        Additional Social History: Lives with biological parents and 23 yo brother and 25 yo sister.  Allergies:  No Known Allergies  Metabolic Disorder Labs: Lab Results  Component Value Date   HGBA1C 4.9 01/01/2013   No results found for: PROLACTIN No results found for: CHOL, TRIG, HDL, CHOLHDL, VLDL, LDLCALC   Current Medications: Current Outpatient Prescriptions  Medication Sig Dispense Refill  . oseltamivir (TAMIFLU) 75 MG capsule Take 1 capsule (75 mg total) by mouth 2 (two) times daily. 10 capsule 0  . sertraline (ZOLOFT) 100 MG tablet Take 1.5 tablets (150 mg total) by mouth daily. 45 tablet 1  . SPRINTEC 28 0.25-35 MG-MCG tablet Take 1 tablet by mouth daily.  11  . traZODone (DESYREL) 100 MG tablet Take 1 tablet (100 mg total) by mouth at bedtime. [place]  tablet 1   Current Facility-Administered Medications  Medication Dose Route Frequency Provider Last Rate Last Dose  . 0.9 %  sodium chloride infusion  500 mL Intravenous Continuous Armbruster, Renelda Loma, MD        Neurologic: Headache: No Seizure: No Paresthesias:No  Musculoskeletal: Strength & Muscle Tone: within normal limits Gait & Station: normal Patient leans: N/A  Psychiatric Specialty Exam: ROS  Blood pressure 97/63, pulse 65, temperature 98 F (36.7 C), temperature source Oral, weight 130 lb (59 kg), last menstrual period 02/12/2017.Body mass index is 22.31 kg/m.  General  Appearance: Casual  Eye Contact:  Fair  Speech:  Clear and Coherent  Volume:  Normal  Mood:  Improved   Affect:  Smiling and pleasant   Thought Process:  Coherent  Orientation:  Full (Time, Place, and Person)  Thought Content:  WDL and Logical  Suicidal Thoughts:  Denies   Homicidal Thoughts:  No  Memory:  Immediate;   Fair Recent;   Fair Remote;   Fair  Judgement:  Fair  Insight:  Fair  Psychomotor Activity:  Normal  Concentration:  Concentration: Fair and Attention Span: Fair  Recall:  [employer] of Knowledge:Fair  Language: Fair  Akathisia:  No  Handed:  Right  AIMS (if indicated):  na  Assets:  Communication Skills Desire for Improvement Financial Resources/Insurance Physical Health Social Support Vocational/Educational  ADL's:  Intact  Cognition: WNL  Sleep:  Much improved     Treatment Plan Summary: Major Depressive disorder Continue Zoloft at 150 mg once daily. Patient aware of side effects and benefits. Continue therapy with Royal Piedra and Work on anxiety surrounding her driving.  Anxiety same as above Patient recommended to exercise daily and discussed the importance of farm good diet and exercise to help with anxiety and mood  Insomnia Continue trazodone at 100 mg at bedtime  Return to clinic in 2 months time or call before if needed.   Elvin So, MD 5/22/201810:11 AM

## 2017-03-11 ENCOUNTER — Ambulatory Visit (INDEPENDENT_AMBULATORY_CARE_PROVIDER_SITE_OTHER): Payer: 59 | Admitting: Licensed Clinical Social Worker

## 2017-03-11 DIAGNOSIS — F411 Generalized anxiety disorder: Secondary | ICD-10-CM | POA: Diagnosis not present

## 2017-03-13 NOTE — Progress Notes (Signed)
   THERAPIST PROGRESS NOTE  Session Time: 35min  Participation Level: Active  Behavioral Response: CasualAlertAnxious  Type of Therapy: Individual Therapy  Treatment Goals addressed: Coping and Diagnosis: Anxiety  Interventions: CBT, Motivational Interviewing, Solution Focused, Strength-based and Supportive  Summary: Victoria Simmons is a 21 y.o. female who presents with continued symptoms of her diagnosis. Patient reports that she has felt "really good" lately.  She reports that she has been able to handle her stressors lately.  She reports that she struggles at work but has been able to use coping skills to overcome stressors.  Patient reports that she has been planning a mini vacation to help with distracting her thoughts.    Suicidal/Homicidal: No  Therapist Response: LCSW provided Patient with ongoing emotional support and encouragement.  Normalized her feelings.  Commended Patient on her progress and reinforced the importance of client staying focused on her own strengths and resources and resiliency. Processed various strategies for dealing with stressors.     Plan: Return again in 2 weeks.  Diagnosis: Axis I: Generalized Anxiety Disorder    Axis II: No diagnosis    Lubertha South, LCSW 03/11/2017

## 2017-04-01 MED FILL — traZODone HCL 100 MG TABS: 100 | 30 days supply | Qty: 30 | Fill #1

## 2017-04-05 ENCOUNTER — Other Ambulatory Visit: Payer: Self-pay | Admitting: Family Medicine

## 2017-04-08 ENCOUNTER — Ambulatory Visit (INDEPENDENT_AMBULATORY_CARE_PROVIDER_SITE_OTHER): Payer: 59 | Admitting: Licensed Clinical Social Worker

## 2017-04-08 DIAGNOSIS — F411 Generalized anxiety disorder: Secondary | ICD-10-CM | POA: Diagnosis not present

## 2017-04-09 NOTE — Progress Notes (Signed)
   THERAPIST PROGRESS NOTE  Session Time: 9min  Participation Level: Active  Behavioral Response: CasualAlertAnxious  Type of Therapy: Individual Therapy  Treatment Goals addressed: Coping and Diagnosis: Anxiety  Interventions: CBT, Motivational Interviewing, Solution Focused, Strength-based and Supportive  Summary: Victoria Simmons is a 21 y.o. female who presents with continued symptoms of her diagnosis. Patient reports that her mood is "good".  She denies any anxiety and reports progress with expressing herself at work.  She reports a "bad" shouting match between herself and her mother.  She reports that she will have time for herself this holiday week.  Reports going to [place] with friends for a few days.  She reports that she is traveling to [facility] at the end of the month.  She has minimal reservation about riding on a plane by herself.  SHe was able to verbally discuss her irrational thoughts about flying.  Suicidal/Homicidal: No  Therapist Response: LCSW provided Patient with ongoing emotional support and encouragement.  Normalized her feelings.  Commended Patient on her progress and reinforced the importance of client staying focused on her own strengths and resources and resiliency. Processed various strategies for dealing with stressors.     Plan: Return again in 2 weeks.  Diagnosis: Axis I: Generalized Anxiety Disorder    Axis II: No diagnosis    Lubertha South, LCSW 03/11/2017

## 2017-04-15 ENCOUNTER — Ambulatory Visit: Payer: 59 | Admitting: Licensed Clinical Social Worker

## 2017-04-22 ENCOUNTER — Ambulatory Visit (INDEPENDENT_AMBULATORY_CARE_PROVIDER_SITE_OTHER): Payer: 59 | Admitting: Psychiatry

## 2017-04-22 DIAGNOSIS — F331 Major depressive disorder, recurrent, moderate: Secondary | ICD-10-CM | POA: Diagnosis not present

## 2017-04-22 DIAGNOSIS — F411 Generalized anxiety disorder: Secondary | ICD-10-CM | POA: Diagnosis not present

## 2017-04-22 MED ORDER — BUSPIRONE HCL 5 MG PO TABS: 5.0000 mg | ORAL_TABLET | Freq: Every day | ORAL | 1 refills | Status: DC

## 2017-04-22 MED ORDER — SERTRALINE HCL 100 MG PO TABS: 150.0000 mg | ORAL_TABLET | Freq: Every day | ORAL | 1 refills | Status: DC

## 2017-04-22 MED ORDER — TRAZODONE HCL 100 MG PO TABS: 100.0000 mg | ORAL_TABLET | Freq: Every day | ORAL | 1 refills | Status: DC

## 2017-04-22 MED FILL — busPIRone HCL 5 MG TABS: 5 | 60 days supply | Qty: 60 | Fill #0

## 2017-04-22 MED FILL — SERTRALINE HCL 100 MG TAB: 100 | 30 days supply | Qty: 45 | Fill #0

## 2017-04-22 NOTE — Progress Notes (Signed)
Psychiatric progress note  Patient Identification: Victoria Simmons MRN:  [id number] Date of Evaluation:  04/22/2017 Referral Source: Dr.Aaron Chief Complaint:  Not doing well   Visit Diagnosis:    ICD-10-CM   1. GAD (generalized anxiety disorder) F41.1   2. Moderate episode of recurrent major depressive disorder (HCC) F33.1     History of Present Illness: Patient was seen today for follow-up of depression and anxiety. Reports being very stressed today, states she is worried about her class. She is enrolled for 1 class at [employer] and is extremely anxious about not doing well. She is getting into arguments with her brother. She is more irritable, easily annoyed. Fair sleep and appetite. Endorsing some suicidal thought when very stressed. Reports she will not do anything and is just stressed about all her stressors. States she will tell someone if she actually has the urge to hurt herself. She is trying to hang out with friends more and also exercise. We discussed some strategies to reduce her stress and patient states that she is trying and is receptive to feedback.   Past Psychiatric History: Saw a psychologist twice earlier in the year.  Previous Psychotropic Medications: Yes , zoloft- stopped working at 50mg  , tried Prozac couple years ago but did not help.   Substance Abuse History in the last 12 months:  No.  Consequences of Substance Abuse: Negative  Past Medical History:  Past Medical History:  Diagnosis Date  . Ovarian torsion 2008    Past Surgical History:  Procedure Laterality Date  . OVARY SURGERY      Family Psychiatric History: Mother has anxiety  Family History:  Family History  Problem Relation Age of Onset  . Hepatitis B Mother   . Anxiety disorder Mother   . Hypertension Father   . ADD / ADHD Brother     Social History:   Social History   Social History  . Marital status: Single    Spouse name: N/A  . Number of children: N/A  . Years of education:  N/A   Social History Main Topics  . Smoking status: Never Smoker  . Smokeless tobacco: Never Used  . Alcohol use No  . Drug use: No  . Sexual activity: No   Other Topics Concern  . Not on file   Social History Narrative   ** Merged History Encounter **        Additional Social History: Lives with biological parents and 40 yo brother and 23 yo sister.  Allergies:  No Known Allergies  Metabolic Disorder Labs: Lab Results  Component Value Date   HGBA1C 4.9 01/01/2013   No results found for: PROLACTIN No results found for: CHOL, TRIG, HDL, CHOLHDL, VLDL, LDLCALC   Current Medications: Current Outpatient Prescriptions  Medication Sig Dispense Refill  . oseltamivir (TAMIFLU) 75 MG capsule Take 1 capsule (75 mg total) by mouth 2 (two) times daily. 10 capsule 0  . sertraline (ZOLOFT) 100 MG tablet Take 1.5 tablets (150 mg total) by mouth daily. 45 tablet 1  . SPRINTEC 28 0.25-35 MG-MCG tablet Take 1 tablet by mouth daily.  11  . SPRINTEC 28 0.25-35 MG-MCG tablet TAKE 1 TABLET BY MOUTH DAILY. 28 tablet 0  . traZODone (DESYREL) 100 MG tablet Take 1 tablet (100 mg total) by mouth at bedtime. 30 tablet 1   Current Facility-Administered Medications  Medication Dose Route Frequency Provider Last Rate Last Dose  . 0.9 %  sodium chloride infusion  500 mL Intravenous Continuous Pocahontas Cellar  Eddie Dibbles, MD        Neurologic: Headache: No Seizure: No Paresthesias:No  Musculoskeletal: Strength & Muscle Tone: within normal limits Gait & Station: normal Patient leans: N/A  Psychiatric Specialty Exam: ROS  There were no vitals taken for this visit.There is no height or weight on file to calculate BMI.  General Appearance: Casual  Eye Contact:  Fair  Speech:  Clear and Coherent  Volume:  Normal  Mood:  Anxious, depressed  Affect:  constricted  Thought Process:  Coherent  Orientation:  Full (Time, Place, and Person)  Thought Content:  WDL and Logical  Suicidal Thoughts: yes,  Able to contract for safety and denies a plan   Homicidal Thoughts:  No  Memory:  Immediate;   Fair Recent;   Fair Remote;   Fair  Judgement:  Fair  Insight:  Fair  Psychomotor Activity:  Normal  Concentration:  Concentration: Fair and Attention Span: Fair  Recall:  [employer] of Knowledge:Fair  Language: Fair  Akathisia:  No  Handed:  Right  AIMS (if indicated):  na  Assets:  Communication Skills Desire for Improvement Financial Resources/Insurance Physical Health Social Support Vocational/Educational  ADL's:  Intact  Cognition: WNL  Sleep:  good    Treatment Plan Summary: Major Depressive disorder Continue Zoloft at 150 mg once daily. Patient aware of side effects and benefits. Continue therapy with Royal Piedra and Work on anxiety surrounding her class.  Anxiety Start buspar at 5mg  po bid.   Insomnia Continue trazodone at 100 mg at bedtime  Return to clinic in 2 weeks time or call before if needed.   Elvin So, MD 7/16/201810:13 AM

## 2017-04-23 ENCOUNTER — Other Ambulatory Visit: Payer: Self-pay | Admitting: Family Medicine

## 2017-04-25 MED FILL — traZODone HCL 100 MG TABS: 100 | 30 days supply | Qty: 30 | Fill #1

## 2017-05-07 ENCOUNTER — Encounter: Payer: Self-pay | Admitting: Psychiatry

## 2017-05-07 ENCOUNTER — Ambulatory Visit (INDEPENDENT_AMBULATORY_CARE_PROVIDER_SITE_OTHER): Payer: 59 | Admitting: Psychiatry

## 2017-05-07 VITALS — BP 99/65 | HR 63 | Temp 97.8°F | Wt 131.6 lb

## 2017-05-07 DIAGNOSIS — F331 Major depressive disorder, recurrent, moderate: Secondary | ICD-10-CM | POA: Diagnosis not present

## 2017-05-07 DIAGNOSIS — F411 Generalized anxiety disorder: Secondary | ICD-10-CM

## 2017-05-07 MED ORDER — BUSPIRONE HCL 10 MG PO TABS: 10.0000 mg | ORAL_TABLET | Freq: Every day | ORAL | 1 refills | Status: DC

## 2017-05-07 NOTE — Progress Notes (Signed)
Psychiatric progress note  Patient Identification: Victoria Simmons MRN:  [id number] Date of Evaluation:  05/07/2017 Referral Source: Dr.Aaron Chief Complaint:  Anxiety slightly better  Chief Complaint    Follow-up; Medication Refill     Visit Diagnosis:    ICD-10-CM   1. GAD (generalized anxiety disorder) F41.1   2. Moderate episode of recurrent major depressive disorder (HCC) F33.1     History of Present Illness: Patient was seen today for follow-up of depression and anxiety. Reports she was able to tolerate the buspar well, she is doing a little better in terms of her anxiety. Continues to worry about her classes. Fair sleep and appetite. She reports that the on the BuSpar she felt like her suicidal thoughts had gone away. She went to [place] this past weekend and had a very good time with her cousin. States that she is doing well otherwise. States she hasn't been back in town long enough to see how she is doing otherwise. She stated that after she got back last night she did have an argument with her brother over a stupid reason. Past Psychiatric History: Saw a psychologist twice earlier in the year.  Previous Psychotropic Medications: Yes , zoloft- stopped working at 50mg  , tried Prozac couple years ago but did not help.   Substance Abuse History in the last 12 months:  No.  Consequences of Substance Abuse: Negative  Past Medical History:  Past Medical History:  Diagnosis Date  . Ovarian torsion 2008    Past Surgical History:  Procedure Laterality Date  . OVARY SURGERY      Family Psychiatric History: Mother has anxiety  Family History:  Family History  Problem Relation Age of Onset  . Hepatitis B Mother   . Anxiety disorder Mother   . Hypertension Father   . ADD / ADHD Brother     Social History:   Social History   Social History  . Marital status: Single    Spouse name: N/A  . Number of children: N/A  . Years of education: N/A   Social History Main Topics   . Smoking status: Never Smoker  . Smokeless tobacco: Never Used  . Alcohol use No  . Drug use: No  . Sexual activity: No   Other Topics Concern  . None   Social History Narrative   ** Merged History Encounter **        Additional Social History: Lives with biological parents and 17 yo brother and 62 yo sister.  Allergies:  No Known Allergies  Metabolic Disorder Labs: Lab Results  Component Value Date   HGBA1C 4.9 01/01/2013   No results found for: PROLACTIN No results found for: CHOL, TRIG, HDL, CHOLHDL, VLDL, LDLCALC   Current Medications: Current Outpatient Prescriptions  Medication Sig Dispense Refill  . busPIRone (BUSPAR) 5 MG tablet Take 1 tablet (5 mg total) by mouth daily. 60 tablet 1  . oseltamivir (TAMIFLU) 75 MG capsule Take 1 capsule (75 mg total) by mouth 2 (two) times daily. 10 capsule 0  . sertraline (ZOLOFT) 100 MG tablet Take 1.5 tablets (150 mg total) by mouth daily. 45 tablet 1  . SPRINTEC 28 0.25-35 MG-MCG tablet Take 1 tablet by mouth daily.  11  . SPRINTEC 28 0.25-35 MG-MCG tablet TAKE 1 TABLET BY MOUTH EVERY DAY 28 tablet 0  . traZODone (DESYREL) 100 MG tablet Take 1 tablet (100 mg total) by mouth at bedtime. 30 tablet 1   Current Facility-Administered Medications  Medication Dose Route Frequency  Provider Last Rate Last Dose  . 0.9 %  sodium chloride infusion  500 mL Intravenous Continuous Armbruster, Renelda Loma, MD        Neurologic: Headache: No Seizure: No Paresthesias:No  Musculoskeletal: Strength & Muscle Tone: within normal limits Gait & Station: normal Patient leans: N/A  Psychiatric Specialty Exam: ROS  Blood pressure 99/65, pulse 63, temperature 97.8 F (36.6 C), temperature source Oral, weight 131 lb 9.6 oz (59.7 kg), last menstrual period 04/06/2017.Body mass index is 22.59 kg/m.  General Appearance: Casual  Eye Contact:  Fair  Speech:  Clear and Coherent  Volume:  Normal  Mood:  Pleasant   Affect:  Better   Thought  Process:  Coherent  Orientation:  Full (Time, Place, and Person)  Thought Content:  WDL and Logical  Suicidal Thoughts: None this past week   Homicidal Thoughts:  No  Memory:  Immediate;   Fair Recent;   Fair Remote;   Fair  Judgement:  Fair  Insight:  Fair  Psychomotor Activity:  Normal  Concentration:  Concentration: Fair and Attention Span: Fair  Recall:  [employer] of Knowledge:Fair  Language: Fair  Akathisia:  No  Handed:  Right  AIMS (if indicated):  na  Assets:  Communication Skills Desire for Improvement Financial Resources/Insurance Physical Health Social Support Vocational/Educational  ADL's:  Intact  Cognition: WNL  Sleep:  good    Treatment Plan Summary: Major Depressive disorder Continue Zoloft at 150 mg once daily. Patient aware of side effects and benefits. Continue therapy with Royal Piedra and Work on anxiety surrounding her class.  Anxiety Increase BuSpar to 10 mg twice daily   Insomnia Continue trazodone at 100 mg at bedtime  Return to clinic in 2 weeks time or call before if needed.   Elvin So, MD 7/31/20189:36 AM

## 2017-05-13 ENCOUNTER — Ambulatory Visit (INDEPENDENT_AMBULATORY_CARE_PROVIDER_SITE_OTHER): Payer: 59 | Admitting: Licensed Clinical Social Worker

## 2017-05-13 ENCOUNTER — Inpatient Hospital Stay
Admission: EM | Admit: 2017-05-13 | Discharge: 2017-05-16 | DRG: 885 | Disposition: A | Discharge status: Home / Self Care | Payer: 59 | Source: Intra-hospital | Attending: Psychiatry | Admitting: Psychiatry

## 2017-05-13 DIAGNOSIS — Z79899 Other long term (current) drug therapy: Secondary | ICD-10-CM | POA: Diagnosis not present

## 2017-05-13 DIAGNOSIS — Z8249 Family history of ischemic heart disease and other diseases of the circulatory system: Secondary | ICD-10-CM | POA: Diagnosis not present

## 2017-05-13 DIAGNOSIS — F411 Generalized anxiety disorder: Secondary | ICD-10-CM

## 2017-05-13 DIAGNOSIS — Z8349 Family history of other endocrine, nutritional and metabolic diseases: Secondary | ICD-10-CM

## 2017-05-13 DIAGNOSIS — F3181 Bipolar II disorder: Principal | ICD-10-CM | POA: Diagnosis present

## 2017-05-13 DIAGNOSIS — F331 Major depressive disorder, recurrent, moderate: Secondary | ICD-10-CM

## 2017-05-13 DIAGNOSIS — Z793 Long term (current) use of hormonal contraceptives: Secondary | ICD-10-CM | POA: Diagnosis not present

## 2017-05-13 DIAGNOSIS — N390 Urinary tract infection, site not specified: Secondary | ICD-10-CM | POA: Diagnosis present

## 2017-05-13 DIAGNOSIS — G4700 Insomnia, unspecified: Secondary | ICD-10-CM | POA: Diagnosis present

## 2017-05-13 DIAGNOSIS — R45851 Suicidal ideations: Secondary | ICD-10-CM | POA: Diagnosis present

## 2017-05-13 DIAGNOSIS — Z818 Family history of other mental and behavioral disorders: Secondary | ICD-10-CM | POA: Diagnosis not present

## 2017-05-13 DIAGNOSIS — E7800 Pure hypercholesterolemia, unspecified: Secondary | ICD-10-CM | POA: Diagnosis present

## 2017-05-13 MED ORDER — TRAZODONE HCL 100 MG PO TABS
100.0000 mg | ORAL_TABLET | Freq: Every day | ORAL | Status: DC
  Administered 2017-05-13 – 2017-05-15 (×3): 100 mg via ORAL
  Filled 2017-05-13 (×3): qty 1

## 2017-05-13 MED ORDER — ACETAMINOPHEN 325 MG PO TABS: 650.0000 mg | ORAL_TABLET | Freq: Four times a day (QID) | ORAL | Status: DC | PRN

## 2017-05-13 MED ORDER — HYDROXYZINE HCL 25 MG PO TABS: 25.0000 mg | ORAL_TABLET | Freq: Three times a day (TID) | ORAL | Status: DC | PRN

## 2017-05-13 MED ORDER — FLUVOXAMINE MALEATE 50 MG PO TABS
50.0000 mg | ORAL_TABLET | Freq: Every day | ORAL | Status: DC
Start: 2017-05-13 — End: 2017-05-14
  Administered 2017-05-13: 50 mg via ORAL
  Filled 2017-05-13: qty 1

## 2017-05-13 MED ORDER — BUSPIRONE HCL 5 MG PO TABS: 10.0000 mg | ORAL_TABLET | Freq: Two times a day (BID) | ORAL | Status: DC

## 2017-05-13 MED ORDER — ALUM & MAG HYDROXIDE-SIMETH 200-200-20 MG/5ML PO SUSP: 30.0000 mL | ORAL | Status: DC | PRN

## 2017-05-13 MED ORDER — SERTRALINE HCL 100 MG PO TABS: 150.0000 mg | ORAL_TABLET | Freq: Every day | ORAL | Status: DC

## 2017-05-13 MED ORDER — TRAZODONE HCL 100 MG PO TABS: 100.0000 mg | ORAL_TABLET | Freq: Every evening | ORAL | Status: DC | PRN

## 2017-05-13 MED ORDER — MAGNESIUM HYDROXIDE 400 MG/5ML PO SUSP: 30.0000 mL | Freq: Every day | ORAL | Status: DC | PRN

## 2017-05-13 MED ORDER — LAMOTRIGINE 25 MG PO TABS
25.0000 mg | ORAL_TABLET | Freq: Every day | ORAL | Status: DC
  Administered 2017-05-13 – 2017-05-15 (×3): 25 mg via ORAL
  Filled 2017-05-13 (×3): qty 1

## 2017-05-13 NOTE — BHH Suicide Risk Assessment (Signed)
[facility] Admission Suicide Risk Assessment   Nursing information obtained from:    Demographic factors:    Current Mental Status:    Loss Factors:    Historical Factors:    Risk Reduction Factors:     Total Time spent with patient: 1 hour Principal Problem: <principal problem not specified> Diagnosis:   Patient Active Problem List   Diagnosis Date Noted  . Major depressive disorder, recurrent episode, severe with anxious distress ([place]) [F33.2] 05/13/2017  . Menorrhagia [N92.0] 01/25/2014  . Anxiety state [F41.1] 01/26/2013  . Family history of hypercholesterolemia [Z83.42] 01/06/2013  . Murmur [R01.1] 01/06/2013   Subjective Data: suicidal ideation.  Continued Clinical Symptoms:  Alcohol Use Disorder Identification Test Final Score (AUDIT): 0 The "Alcohol Use Disorders Identification Test", Guidelines for Use in Primary Care, Second Edition.  World Pharmacologist [facility]). Score between 0-7:  no or low risk or alcohol related problems. Score between 8-15:  moderate risk of alcohol related problems. Score between 16-19:  high risk of alcohol related problems. Score 20 or above:  warrants further diagnostic evaluation for alcohol dependence and treatment.   CLINICAL FACTORS:   Severe Anxiety and/or Agitation Depression:   Impulsivity Insomnia   Musculoskeletal: Strength & Muscle Tone: within normal limits Gait & Station: normal Patient leans: N/A  Psychiatric Specialty Exam: Physical Exam  Nursing note and vitals reviewed. Psychiatric: Her speech is normal and behavior is normal. Her mood appears anxious. Cognition and memory are normal. She expresses impulsivity. She exhibits a depressed mood. She expresses suicidal ideation. She expresses suicidal plans.    Review of Systems  Psychiatric/Behavioral: Positive for depression and suicidal ideas. The patient has insomnia.   All other systems reviewed and are negative.   Blood pressure 110/63, pulse 61, temperature 98.9 F  (37.2 C), temperature source Oral, resp. rate 18, height 5\' 5"  (1.651 m), weight 59.9 kg (132 lb), SpO2 100 %.Body mass index is 21.97 kg/m.  General Appearance: Casual  Eye Contact:  Good  Speech:  Clear and Coherent  Volume:  Decreased  Mood:  Anxious and Depressed  Affect:  Tearful  Thought Process:  Coherent, Goal Directed and Descriptions of Associations: Intact  Orientation:  Full (Time, Place, and Person)  Thought Content:  WDL  Suicidal Thoughts:  Yes.  with intent/plan  Homicidal Thoughts:  No  Memory:  Immediate;   Fair Recent;   Fair Remote;   Fair  Judgement:  Fair  Insight:  Shallow  Psychomotor Activity:  Normal  Concentration:  Concentration: Fair and Attention Span: Fair  Recall:  [employer] of Knowledge:  Fair  Language:  Fair  Akathisia:  No  Handed:  Right  AIMS (if indicated):     Assets:  Communication Skills Desire for Improvement Financial Resources/Insurance Housing Physical Health Resilience Social Support Vocational/Educational  ADL's:  Intact  Cognition:  WNL  Sleep:         COGNITIVE FEATURES THAT CONTRIBUTE TO RISK:  None    SUICIDE RISK:   Severe:  Frequent, intense, and enduring suicidal ideation, specific plan, no subjective intent, but some objective markers of intent (i.e., choice of lethal method), the method is accessible, some limited preparatory behavior, evidence of impaired self-control, severe dysphoria/symptomatology, multiple risk factors present, and few if any protective factors, particularly a lack of social support.  PLAN OF CARE: Hospital admission, medication management, discharge planning.  [name] is a 21 year old female with history of severe anxiety admitted for suicidal thinking with a plan.  1.  Suicidal ideation. The patient is able to contract for safety in the hospital.  2. Mood. She has been maintained on a combination of Zoloft, BuSpar, and trazodone. We will start Lamictal for mood stabilization and  Luvox for anxiety.  3. Insomnia. Trazodone is available.  4. Anxiety. Vistaril is available.  5. Metabolic syndrome monitoring. Lipid panel, TSH, hemoglobin A1c are pending.  6. EKG. Pending.  7. Disposition. She will be discharged to home with family. She will follow-up with Dr. Einar Grad and Lorinda Creed her therapist.  I certify that inpatient services furnished can reasonably be expected to improve the patient's condition.   Orson Slick, MD 05/13/2017, 3:07 PM

## 2017-05-13 NOTE — Progress Notes (Signed)
Pt is a 21 y.o female admitted voluntary for Suicidal thoughts who has been researching ways on the Internet  to overdose.Pt tearful upon assessment. Skin check completed with phyllis RN per protocol. Pt stated, " I woke up this morning and I look up how much medicine I need to take to kill myself". Pt reports she has been feeling like this for the past three months because of her work life, school, and family issues. Pt denies HI and a/v hallucinations. Pt commits to safety on unit. Pt oriented to unit, rules, staff, and activities. Will continue to monitor.

## 2017-05-13 NOTE — Plan of Care (Signed)
Problem: Safety: Goal: Periods of time without injury will increase Outcome: Progressing Pt remains safe while in hospital injury free. Commits to safety on unit.

## 2017-05-13 NOTE — Tx Team (Signed)
Initial Treatment Plan 05/13/2017 5:15 PM Victoria Simmons [facility]:[id number]    PATIENT STRESSORS: Educational concerns Marital or family conflict   PATIENT STRENGTHS: Ability for insight Average or above average intelligence Capable of independent living Communication skills General fund of knowledge   PATIENT IDENTIFIED PROBLEMS: Suicidal thoughts  Anxiety                    DISCHARGE CRITERIA:  Improved stabilization in mood, thinking, and/or behavior  PRELIMINARY DISCHARGE PLAN: Outpatient therapy  PATIENT/FAMILY INVOLVEMENT: This treatment plan has been presented to and reviewed with the patient, Victoria Simmons, and/or family member, .  The patient and family have been given the opportunity to ask questions and make suggestions.  Evaristo Bury, RN 05/13/2017, 5:15 PM

## 2017-05-13 NOTE — Progress Notes (Signed)
   THERAPIST PROGRESS NOTE  Session Time: 77min  Participation Level: Active  Behavioral Response: CasualAlertAnxious  Type of Therapy: Individual Therapy  Treatment Goals addressed: Coping and Diagnosis: Anxiety  Interventions: CBT, Motivational Interviewing, Solution Focused, Strength-based and Supportive  Summary: Victoria Simmons is a 21 y.o. female who presents with continued symptoms of her diagnosis. Patient reports that her mood is "poor."  She reports that she has been having thoughts of suicide for several weeks.  She states that she is unable to contract for safety.  She reports "sitting on her bed googling how many pills to take to die."  She denies the ability to make improvements in her mental health at this time.  Patient requested assistance with her depression and suicidal thoughts.  Patient reports that she recently was on vacation but was unable to enjoy herself due to [place].  She reports coping skills are not useful.    Suicidal/Homicidal: No  Therapist Response: LCSW provided Patient with ongoing emotional support and encouragement.  Discussion with Dr. Einar Grad about admission into [facility] behavioral health unit.  COntacted Calvin from TTS to discuss admission.    Plan: Return again upon discharge from behavioral health unit.  Diagnosis: Axis I: Generalized Anxiety Disorder and Depression    Axis II: No diagnosis    Lubertha South, LCSW 05/13/2017

## 2017-05-13 NOTE — H&P (Signed)
Psychiatric Admission Assessment Adult  Patient Identification: Victoria Simmons MRN:  [id number] Date of Evaluation:  05/13/2017 Chief Complaint:  depression Principal Diagnosis: Bipolar 2 disorder, major depressive episode ([place]) Diagnosis:   Patient Active Problem List   Diagnosis Date Noted  . Bipolar 2 disorder, major depressive episode ([place]) [F31.81] 05/13/2017  . Suicidal ideation [R45.851] 05/13/2017  . Generalized anxiety disorder [F41.1] 05/13/2017  . Menorrhagia [N92.0] 01/25/2014  . Anxiety state [F41.1] 01/26/2013  . Family history of hypercholesterolemia [Z83.42] 01/06/2013  . Murmur [R01.1] 01/06/2013   History of Present Illness:   Identifying data. Victoria Simmons is a 21 year old female with history of severe anxiety.  Chief complaint. "I can't stop thinking about suicide."  History of present illness. Information was obtained from the patient and the chart. The patient was brought to our unit following a visit with her therapist with concerns for her safety. For the past couple weeks the patient has continuous ruminations about suicide. She has been researching Internet to find a good way to kill herself but was not forthcoming about the method. The patient has a long history of anxiety "as long as I can remember". She was started on the Zoloft in 2017 and initially responded very well to it. BuSpar and trazodone were added to her regimen but gradually medications stopped working. Since May the patient has been feeling worse. She denies symptoms of depression and was forward symptoms of anxiety with panic attacks generalized anxiety and social anxiety. She denies compulsions, but has been obsessive. In May her Zoloft was discontinued and BuSpar increased with no improvement. 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 She denies symptoms suggestive of bipolar mania but does  report frequent and profound mood swings, irritability, poor impulse control, hyperactivity, and insomnia. She denies alcohol or illicit substance use.  Past psychiatric history. She has never attempted suicide. She was treated with Prozac and when in high school with no improvement. She is currently a patient of Dr. Einar Simmons and sees a therapist. Family psychiatric history. Grandmother and mother with severe anxiety. Mother on the Xanax. Grandmother's sister has bipolar illness with distinct manic episodes.  Social history. 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 At the end of her freshman year and first semester of sophomore year she started experiencing incapacitating anxiety and suffered from somatic symptoms mostly abdominal pain, nausea, vomiting that were already investigated by her internist. She had to withdraw from school and has been suspended during spring semester. She intends to return to community college in 2 weeks to study criminal justice but wants to start with one course only. She works at the [employer] close to where she lives as she still does not have a driver's license due to anxiety. She is with her parents and 2 younger siblings.   Total Time spent with patient: 1 hour  Is the patient at risk to self? Yes.    Has the patient been a risk to self in the past 6 months? No.  Has the patient been a risk to self within the distant past? No.  Is the patient a risk to others? No.  Has the patient been a risk to others in the past 6 months? No.  Has the patient been a risk to others within the distant past? No.   Prior Inpatient Therapy:   Prior Outpatient  Therapy:    Alcohol Screening: 1. How often do you have a drink containing alcohol?: Never 9. Have you or someone else been injured as a result of your drinking?: No 10. Has a relative or friend or a doctor or another health worker been concerned about your drinking or suggested you cut  down?: No Alcohol Use Disorder Identification Test Final Score (AUDIT): 0 Brief Intervention: AUDIT score less than 7 or less-screening does not suggest unhealthy drinking-brief intervention not indicated Substance Abuse History in the last 12 months:  No. Consequences of Substance Abuse: NA Previous Psychotropic Medications: Yes. Psychological Evaluations: No  Past Medical History:  Past Medical History:  Diagnosis Date  . Ovarian torsion 2008    Past Surgical History:  Procedure Laterality Date  . OVARY SURGERY     Family History:  Family History  Problem Relation Age of Onset  . Hepatitis B Mother   . Anxiety disorder Mother   . Hypertension Father   . ADD / ADHD Brother    Tobacco Screening: Have you used any form of tobacco in the last 30 days? (Cigarettes, Smokeless Tobacco, Cigars, and/or Pipes): No Social History:  History  Alcohol Use No     History  Drug Use No    Additional Social History:                           Allergies:  No Known Allergies Lab Results: No results found for this or any previous visit (from the past 48 hour(s)).  Blood Alcohol level:  No results found for: [facility]  Metabolic Disorder Labs:  Lab Results  Component Value Date   HGBA1C 4.9 01/01/2013   No results found for: PROLACTIN No results found for: CHOL, TRIG, HDL, CHOLHDL, VLDL, LDLCALC  Current Medications: Current Facility-Administered Medications  Medication Dose Route Frequency Provider Last Rate Last Dose  . acetaminophen (TYLENOL) tablet 650 mg  650 mg Oral Q6H PRN Ketina Mars B, MD      . alum & mag hydroxide-simeth (MAALOX/MYLANTA) 200-200-20 MG/5ML suspension 30 mL  30 mL Oral Q4H PRN Helen Winterhalter B, MD      . fluvoxaMINE (LUVOX) tablet 50 mg  50 mg Oral QHS Wister Hoefle B, MD      . hydrOXYzine (ATARAX/VISTARIL) tablet 25 mg  25 mg Oral TID PRN Rashi Giuliani B, MD      . lamoTRIgine (LAMICTAL) tablet 25 mg  25 mg Oral QHS  Kinnick Maus B, MD      . magnesium hydroxide (MILK OF MAGNESIA) suspension 30 mL  30 mL Oral Daily PRN Amzie Sillas B, MD      . traZODone (DESYREL) tablet 100 mg  100 mg Oral QHS Latonia Conrow B, MD       PTA Medications: Facility-Administered Medications Prior to Admission  Medication Dose Route Frequency Provider Last Rate Last Dose  . 0.9 %  sodium chloride infusion  500 mL Intravenous Continuous Armbruster, Renelda Loma, MD       Prescriptions Prior to Admission  Medication Sig Dispense Refill Last Dose  . busPIRone (BUSPAR) 10 MG tablet Take 1 tablet (10 mg total) by mouth daily. 60 tablet 1   . oseltamivir (TAMIFLU) 75 MG capsule Take 1 capsule (75 mg total) by mouth 2 (two) times daily. 10 capsule 0 Taking  . sertraline (ZOLOFT) 100 MG tablet Take 1.5 tablets (150 mg total) by mouth daily. 45 tablet 1 Taking  . [place] 28 0.25-35  MG-MCG tablet Take 1 tablet by mouth daily.  11 Taking  . SPRINTEC 28 0.25-35 MG-MCG tablet TAKE 1 TABLET BY MOUTH EVERY DAY 28 tablet 0 Taking  . traZODone (DESYREL) 100 MG tablet Take 1 tablet (100 mg total) by mouth at bedtime. 30 tablet 1 Taking    Musculoskeletal: Strength & Muscle Tone: within normal limits Gait & Station: normal Patient leans: N/A  Psychiatric Specialty Exam: Physical Exam  Nursing note and vitals reviewed. Constitutional: She is oriented to person, place, and time. She appears well-developed and well-nourished.  HENT:  Head: Normocephalic and atraumatic.  Eyes: Pupils are equal, round, and reactive to light. Conjunctivae and EOM are normal.  Neck: Normal range of motion. Neck supple.  Cardiovascular: Normal rate, regular rhythm and normal heart sounds.   Respiratory: Effort normal and breath sounds normal.  GI: Soft. Bowel sounds are normal.  Musculoskeletal: Normal range of motion.  Neurological: She is alert and oriented to person, place, and time.  Skin: Skin is warm and dry.  Psychiatric: Her  speech is normal and behavior is normal. Her mood appears anxious. Cognition and memory are normal. She expresses impulsivity. She exhibits a depressed mood. She expresses suicidal ideation. She expresses suicidal plans.    Review of Systems  Psychiatric/Behavioral: Positive for depression and suicidal ideas. The patient is nervous/anxious and has insomnia.   All other systems reviewed and are negative.   Blood pressure 110/63, pulse 61, temperature 98.9 F (37.2 C), temperature source Oral, resp. rate 18, height 5\' 5"  (1.651 m), weight 59.9 kg (132 lb), SpO2 100 %.Body mass index is 21.97 kg/m.  See SRA.                                                      Treatment Plan Summary: Daily contact with patient to assess and evaluate symptoms and progress in treatment and Medication management   Ms. Maiorana is a 21 year old female with history of severe anxiety admitted for suicidal thinking with a plan.  1. Suicidal ideation. The patient is able to contract for safety in the hospital.  2. Mood. She has been maintained on a combination of Zoloft, BuSpar, and trazodone. We will start Lamictal for mood stabilization and Luvox for anxiety.  3. Insomnia. Trazodone is available.  4. Anxiety. Vistaril is available.  5. Metabolic syndrome monitoring. Lipid panel, TSH, hemoglobin A1c are pending.  6. EKG. Pending.  7. Pregnancy test. Pending.  8. Disposition. She will be discharged to home with family. She will follow-up with Dr. Einar Simmons and Lorinda Creed her therapist.   Observation Level/Precautions:  15 minute checks  Laboratory:  CBC Chemistry Profile UDS UA  Psychotherapy:    Medications:    Consultations:    Discharge Concerns:    Estimated LOS:  Other:     Physician Treatment Plan for Primary Diagnosis: Bipolar 2 disorder, major depressive episode ([place]) Long Term Goal(s): Improvement in symptoms so as ready for discharge  Short Term Goals: Ability to identify  changes in lifestyle to reduce recurrence of condition will improve, Ability to verbalize feelings will improve, Ability to disclose and discuss suicidal ideas, Ability to demonstrate self-control will improve, Ability to identify and develop effective coping behaviors will improve, Ability to maintain clinical measurements within normal limits will improve, Compliance with prescribed medications will improve and Ability to identify  triggers associated with substance abuse/mental health issues will improve  Physician Treatment Plan for Secondary Diagnosis: Principal Problem:   Bipolar 2 disorder, major depressive episode ([place]) Active Problems:   Suicidal ideation   Generalized anxiety disorder  Long Term Goal(s): NA  Short Term Goals: NA  I certify that inpatient services furnished can reasonably be expected to improve the patient's condition.    Orson Slick, MD 8/6/20183:26 PM

## 2017-05-14 LAB — CBC
HCT: 36.3 % (ref 35.0–47.0)
Hemoglobin: 12.5 g/dL (ref 12.0–16.0)
MCH: 29 pg (ref 26.0–34.0)
MCHC: 34.4 g/dL (ref 32.0–36.0)
MCV: 84.2 fL (ref 80.0–100.0)
Platelets: 244 10*3/uL (ref 150–440)
RBC: 4.31 MIL/uL (ref 3.80–5.20)
RDW: 13.6 % (ref 11.5–14.5)
WBC: 5 10*3/uL (ref 3.6–11.0)

## 2017-05-14 LAB — COMPREHENSIVE METABOLIC PANEL
ALT: 18 U/L (ref 14–54)
AST: 16 U/L (ref 15–41)
Albumin: 3.6 g/dL (ref 3.5–5.0)
Alkaline Phosphatase: 53 U/L (ref 38–126)
Anion gap: 6 (ref 5–15)
BUN: 15 mg/dL (ref 6–20)
CO2: 25 mmol/L (ref 22–32)
Calcium: 9.4 mg/dL (ref 8.9–10.3)
Chloride: 109 mmol/L (ref 101–111)
Creatinine, Ser: 0.89 mg/dL (ref 0.44–1.00)
GFR calc Af Amer: >60 mL/min (ref >60)
GFR calc non Af Amer: >60 mL/min (ref >60)
Glucose, Bld: 86 mg/dL (ref 65–99)
Potassium: 4.3 mmol/L (ref 3.5–5.1)
Sodium: 140 mmol/L (ref 135–145)
Total Bilirubin: 0.9 mg/dL (ref 0.3–1.2)
Total Protein: 7.2 g/dL (ref 6.5–8.1)

## 2017-05-14 LAB — URINE DRUG SCREEN, QUALITATIVE (ARMC ONLY)
Amphetamines, Ur Screen: NOT DETECTED
Barbiturates, Ur Screen: NOT DETECTED
Benzodiazepine, Ur Scrn: NOT DETECTED
Cannabinoid 50 Ng, Ur ~~LOC~~: NOT DETECTED
Cocaine Metabolite,Ur ~~LOC~~: NOT DETECTED
MDMA (Ecstasy)Ur Screen: NOT DETECTED
Methadone Scn, Ur: NOT DETECTED
Opiate, Ur Screen: NOT DETECTED
Phencyclidine (PCP) Ur S: NOT DETECTED
Tricyclic, Ur Screen: NOT DETECTED

## 2017-05-14 LAB — URINALYSIS, COMPLETE (UACMP) WITH MICROSCOPIC
Bacteria, UA: NONE SEEN
Bilirubin Urine: NEGATIVE
Glucose, UA: NEGATIVE mg/dL
Ketones, ur: NEGATIVE mg/dL
Nitrite: NEGATIVE
Protein, ur: NEGATIVE mg/dL
Specific Gravity, Urine: 1.011 (ref 1.005–1.030)
pH: 7 (ref 5.0–8.0)

## 2017-05-14 LAB — LIPID PANEL
Cholesterol: 254 mg/dL — ABNORMAL HIGH (ref 0–200)
HDL: 61 mg/dL (ref >40)
LDL Cholesterol: 163 mg/dL — ABNORMAL HIGH (ref 0–99)
Total CHOL/HDL Ratio: 4.2 RATIO
Triglycerides: 151 mg/dL — ABNORMAL HIGH (ref <150)
VLDL: 30 mg/dL (ref 0–40)

## 2017-05-14 LAB — TSH: TSH: 3.491 u[IU]/mL (ref 0.350–4.500)

## 2017-05-14 LAB — PREGNANCY, URINE: Preg Test, Ur: NEGATIVE

## 2017-05-14 MED ORDER — NORGESTIMATE-ETH ESTRADIOL 0.25-35 MG-MCG PO TABS
1.0000 | ORAL_TABLET | Freq: Every day | ORAL | Status: DC
  Administered 2017-05-15: 1 via ORAL
  Filled 2017-05-14: qty 1

## 2017-05-14 MED ORDER — FOSFOMYCIN TROMETHAMINE 3 G PO PACK
3.0000 g | PACK | Freq: Once | ORAL | Status: DC
  Filled 2017-05-14: qty 3

## 2017-05-14 MED ORDER — FLUVOXAMINE MALEATE 50 MG PO TABS
100.0000 mg | ORAL_TABLET | Freq: Every day | ORAL | Status: DC
  Administered 2017-05-14 – 2017-05-15 (×2): 100 mg via ORAL
  Filled 2017-05-14 (×2): qty 2

## 2017-05-14 MED ORDER — NON FORMULARY
1.0000 | Freq: Every day | Status: DC
  Administered 2017-05-14: 1 via ORAL

## 2017-05-14 NOTE — Progress Notes (Signed)
Presents with bright affect. Verbalizes that she feels better.  Per self inventory denies any thoughts of self harm. Rates depression as a 6/10, anxiety as 8/10 and hopelessness as 5/10.   Attending groups.  Visible in the milieu interacting with peers and staff appropriately.

## 2017-05-14 NOTE — BHH Group Notes (Signed)
[place] Group Notes:  (Nursing/MHT/Case Management/Adjunct)  Date:  05/14/2017  Time:  7:10 AM  Type of Therapy:  Group Therapy  Participation Level:  Active  Participation Quality:  Appropriate, Attentive, Sharing and Supportive  Affect:  Appropriate  Cognitive:  Alert and Appropriate  Insight:  Appropriate  Engagement in Group:  Engaged and Supportive  Modes of Intervention:  Discussion, Socialization and Support  Summary of Progress/Problems:  Almon Hercules [facility] 05/14/2017, 7:10 AM

## 2017-05-14 NOTE — Progress Notes (Signed)
[facility] MD Progress Note  05/14/2017 2:45 PM Victoria Simmons  MRN:  [id number]  Subjective:  Victoria Simmons feels better today. She slept well last night and is less anxious. She was able to participate in every activity on the unit in spite of anxiety. She still has suicidal ideations, yesterday and today, but has not been preoccupied with them entirely. She tolerates medications well. Parents visited last night. They are supportive. There are no somatic complaints.  Per nursing: D: Pt denies SI/HI/AVH. Patient's affect is bright, mood is pleasant and she is cooperative with treatment plan. Patient's thoughts are organized.  Pt appears less anxious and she is interacting with peers and staff appropriately.  A: Pt was offered support and encouragement. Pt was given her  scheduled medications. Pt was encouraged to attend groups. Q 15 minute checks were done for safety.  R:Pt attends groups and interacts appropriately with peers and staff. Pt is complaint with medication, no distress noted. Pt  Is receptive to treatment and safety maintained on unit.  Principal Problem: Bipolar 2 disorder, major depressive episode ([place]) Diagnosis:   Patient Active Problem List   Diagnosis Date Noted  . Bipolar 2 disorder, major depressive episode ([place]) [F31.81] 05/13/2017  . Suicidal ideation [R45.851] 05/13/2017  . Generalized anxiety disorder [F41.1] 05/13/2017  . Menorrhagia [N92.0] 01/25/2014  . Anxiety state [F41.1] 01/26/2013  . Family history of hypercholesterolemia [Z83.42] 01/06/2013  . Murmur [R01.1] 01/06/2013   Total Time spent with patient: 30 minutes  Past Psychiatric History: anxiety.  Past Medical History:  Past Medical History:  Diagnosis Date  . Ovarian torsion 2008    Past Surgical History:  Procedure Laterality Date  . OVARY SURGERY     Family History:  Family History  Problem Relation Age of Onset  . Hepatitis B Mother   . Anxiety disorder Mother   . Hypertension Father   . ADD /  ADHD Brother    Family Psychiatric  History: depression, bipolar. Social History:  History  Alcohol Use No     History  Drug Use No    Social History   Social History  . Marital status: Single    Spouse name: N/A  . Number of children: N/A  . Years of education: N/A   Social History Main Topics  . Smoking status: Never Smoker  . Smokeless tobacco: Never Used  . Alcohol use No  . Drug use: No  . Sexual activity: No   Other Topics Concern  . None   Social History Narrative   ** Merged History Encounter **       Additional Social History:                         Sleep: Fair  Appetite:  Fair  Current Medications: Current Facility-Administered Medications  Medication Dose Route Frequency Provider Last Rate Last Dose  . acetaminophen (TYLENOL) tablet 650 mg  650 mg Oral Q6H PRN Onis Markoff B, MD      . alum & mag hydroxide-simeth (MAALOX/MYLANTA) 200-200-20 MG/5ML suspension 30 mL  30 mL Oral Q4H PRN Destenie Ingber B, MD      . fluvoxaMINE (LUVOX) tablet 100 mg  100 mg Oral QHS Braydn Carneiro B, MD      . hydrOXYzine (ATARAX/VISTARIL) tablet 25 mg  25 mg Oral TID PRN Rosslyn Pasion B, MD      . lamoTRIgine (LAMICTAL) tablet 25 mg  25 mg Oral QHS Solomiya Pascale B, MD  25 mg at 05/13/17 2201  . magnesium hydroxide (MILK OF MAGNESIA) suspension 30 mL  30 mL Oral Daily PRN Livan Hires B, MD      . norgestimate-ethinyl estradiol (ORTHO-CYCLEN,SPRINTEC,PREVIFEM) 0.25-35 MG-MCG tablet 1 tablet  1 tablet Oral Daily Kendall Arnell B, MD      . traZODone (DESYREL) tablet 100 mg  100 mg Oral QHS Rilie Glanz B, MD   100 mg at 05/13/17 2201    Lab Results:  Results for orders placed or performed during the hospital encounter of 05/13/17 (from the past 48 hour(s))  Lipid panel     Status: Abnormal   Collection Time: 05/14/17  6:51 AM  Result Value Ref Range   Cholesterol 254 (H) 0 - 200 mg/dL   Triglycerides 151 (H)  <150 mg/dL   HDL 61 >40 mg/dL   Total CHOL/HDL Ratio 4.2 RATIO   VLDL 30 0 - 40 mg/dL   LDL Cholesterol 163 (H) 0 - 99 mg/dL    Comment:        Total Cholesterol/HDL:CHD Risk Coronary Heart Disease Risk Table                     Men   Women  1/2 Average Risk   3.4   3.3  Average Risk       5.0   4.4  2 X Average Risk   9.6   7.1  3 X Average Risk  23.4   11.0        Use the calculated Patient Ratio above and the CHD Risk Table to determine the patient's CHD Risk.        ATP III CLASSIFICATION (LDL):  <100     mg/dL   Optimal  100-129  mg/dL   Near or Above                    Optimal  130-159  mg/dL   Borderline  160-189  mg/dL   High  >190     mg/dL   Very High   TSH     Status: None   Collection Time: 05/14/17  6:51 AM  Result Value Ref Range   TSH 3.491 0.350 - 4.500 uIU/mL    Comment: Performed by a 3rd Generation assay with a functional sensitivity of <=0.01 uIU/mL.  CBC     Status: None   Collection Time: 05/14/17  6:51 AM  Result Value Ref Range   WBC 5.0 3.6 - 11.0 K/uL   RBC 4.31 3.80 - 5.20 MIL/uL   Hemoglobin 12.5 12.0 - 16.0 g/dL   HCT 36.3 35.0 - 47.0 %   MCV 84.2 80.0 - 100.0 fL   MCH 29.0 26.0 - 34.0 pg   MCHC 34.4 32.0 - 36.0 g/dL   RDW 13.6 11.5 - 14.5 %   Platelets 244 150 - 440 K/uL  Comprehensive metabolic panel     Status: None   Collection Time: 05/14/17  6:51 AM  Result Value Ref Range   Sodium 140 135 - 145 mmol/L   Potassium 4.3 3.5 - 5.1 mmol/L   Chloride 109 101 - 111 mmol/L   CO2 25 22 - 32 mmol/L   Glucose, Bld 86 65 - 99 mg/dL   BUN 15 6 - 20 mg/dL   Creatinine, Ser 0.89 0.44 - 1.00 mg/dL   Calcium 9.4 8.9 - 10.3 mg/dL   Total Protein 7.2 6.5 - 8.1 g/dL   Albumin 3.6 3.5 - 5.0 g/dL  AST 16 15 - 41 U/L   ALT 18 14 - 54 U/L   Alkaline Phosphatase 53 38 - 126 U/L   Total Bilirubin 0.9 0.3 - 1.2 mg/dL   GFR calc non Af Amer >60 >60 mL/min   GFR calc Af Amer >60 >60 mL/min    Comment: (NOTE) The eGFR has been calculated using the  CKD EPI equation. This calculation has not been validated in all clinical situations. eGFR's persistently <60 mL/min signify possible Chronic Kidney Disease.    Anion gap 6 5 - 15  Urinalysis, Complete w Microscopic     Status: Abnormal   Collection Time: 05/14/17  2:01 PM  Result Value Ref Range   Color, Urine YELLOW (A) YELLOW   APPearance CLEAR (A) CLEAR   Specific Gravity, Urine 1.011 1.005 - 1.030   pH 7.0 5.0 - 8.0   Glucose, UA NEGATIVE NEGATIVE mg/dL   Hgb urine dipstick SMALL (A) NEGATIVE   Bilirubin Urine NEGATIVE NEGATIVE   Ketones, ur NEGATIVE NEGATIVE mg/dL   Protein, ur NEGATIVE NEGATIVE mg/dL   Nitrite NEGATIVE NEGATIVE   Leukocytes, UA MODERATE (A) NEGATIVE   RBC / HPF 0-5 0 - 5 RBC/hpf   WBC, UA 6-30 0 - 5 WBC/hpf   Bacteria, UA NONE SEEN NONE SEEN   Squamous Epithelial / LPF 0-5 (A) NONE SEEN  Urine Drug Screen, Qualitative ([facility] only)     Status: None   Collection Time: 05/14/17  2:01 PM  Result Value Ref Range   Tricyclic, Ur Screen NONE DETECTED NONE DETECTED   Amphetamines, Ur Screen NONE DETECTED NONE DETECTED   MDMA (Ecstasy)Ur Screen NONE DETECTED NONE DETECTED   Cocaine Metabolite,Ur [place] NONE DETECTED NONE DETECTED   Opiate, Ur Screen NONE DETECTED NONE DETECTED   Phencyclidine (PCP) Ur S NONE DETECTED NONE DETECTED   Cannabinoid 50 Ng, Ur [place] NONE DETECTED NONE DETECTED   Barbiturates, Ur Screen NONE DETECTED NONE DETECTED   Benzodiazepine, Ur Scrn NONE DETECTED NONE DETECTED   Methadone Scn, Ur NONE DETECTED NONE DETECTED    Comment: (NOTE) 333  Tricyclics, urine               Cutoff 1000 ng/mL 200  Amphetamines, urine             Cutoff 1000 ng/mL 300  MDMA (Ecstasy), urine           Cutoff 500 ng/mL 400  Cocaine Metabolite, urine       Cutoff 300 ng/mL 500  Opiate, urine                   Cutoff 300 ng/mL 600  Phencyclidine (PCP), urine      Cutoff 25 ng/mL 700  Cannabinoid, urine              Cutoff 50 ng/mL 800  Barbiturates, urine              Cutoff 200 ng/mL 900  Benzodiazepine, urine           Cutoff 200 ng/mL 1000 Methadone, urine                Cutoff 300 ng/mL 1100 1200 The urine drug screen provides only a preliminary, unconfirmed 1300 analytical test result and should not be used for non-medical 1400 purposes. Clinical consideration and professional judgment should 1500 be applied to any positive drug screen result due to possible 1600 interfering substances. A more specific alternate chemical method 1700 must be used in  order to obtain a confirmed analytical result.  1800 Gas chromato graphy / mass spectrometry (GC/MS) is the preferred 1900 confirmatory method.   Pregnancy, urine     Status: None   Collection Time: 05/14/17  2:01 PM  Result Value Ref Range   Preg Test, Ur NEGATIVE NEGATIVE    Blood Alcohol level:  No results found for: [facility]  Metabolic Disorder Labs: Lab Results  Component Value Date   HGBA1C 4.9 01/01/2013   No results found for: PROLACTIN Lab Results  Component Value Date   CHOL 254 (H) 05/14/2017   TRIG 151 (H) 05/14/2017   HDL 61 05/14/2017   CHOLHDL 4.2 05/14/2017   VLDL 30 05/14/2017   LDLCALC 163 (H) 05/14/2017    Physical Findings: AIMS: Facial and Oral Movements Muscles of Facial Expression: None, normal Lips and Perioral Area: None, normal Jaw: None, normal Tongue: None, normal,Extremity Movements Upper (arms, wrists, hands, fingers): None, normal Lower (legs, knees, ankles, toes): None, normal, Trunk Movements Neck, shoulders, hips: None, normal, Overall Severity Severity of abnormal movements (highest score from questions above): None, normal Incapacitation due to abnormal movements: None, normal Patient's awareness of abnormal movements (rate only patient's report): No Awareness, Dental Status Current problems with teeth and/or dentures?: No Does patient usually wear dentures?: No  CIWA:  CIWA-Ar Total: 0 COWS:  COWS Total Score: 0  Musculoskeletal: Strength &  Muscle Tone: within normal limits Gait & Station: normal Patient leans: N/A  Psychiatric Specialty Exam: Physical Exam  Nursing note and vitals reviewed. Psychiatric: Her speech is normal and behavior is normal. Thought content normal. Her mood appears anxious. Cognition and memory are normal. She expresses impulsivity.    Review of Systems  Psychiatric/Behavioral: Positive for suicidal ideas. The patient is nervous/anxious and has insomnia.   All other systems reviewed and are negative.   Blood pressure 111/65, pulse 84, temperature 98.7 F (37.1 C), temperature source Oral, resp. rate 18, height _0  (1.651 m), weight 59.9 kg (132 lb), SpO2 100 %.Body mass index is 21.97 kg/m.  General Appearance: Casual  Eye Contact:  Good  Speech:  Clear and Coherent  Volume:  Normal  Mood:  Anxious  Affect:  Appropriate  Thought Process:  Goal Directed and Descriptions of Associations: Intact  Orientation:  Full (Time, Place, and Person)  Thought Content:  WDL  Suicidal Thoughts:  Yes.  with intent/plan  Homicidal Thoughts:  No  Memory:  Immediate;   Fair Recent;   Fair Remote;   Fair  Judgement:  Fair  Insight:  Fair  Psychomotor Activity:  Normal  Concentration:  Concentration: Fair and Attention Span: Fair  Recall:  [employer] of Knowledge:  Fair  Language:  Fair  Akathisia:  No  Handed:  Right  AIMS (if indicated):     Assets:  Communication Skills Desire for Improvement Financial Resources/Insurance Housing Physical Health Resilience Social Support Vocational/Educational  ADL's:  Intact  Cognition:  WNL  Sleep:  Number of Hours: 6.75     Treatment Plan Summary: Daily contact with patient to assess and evaluate symptoms and progress in treatment and Medication management   Victoria Simmons is a 21 year old female with history of severe anxiety admitted for suicidal thinking with a plan.  1. Suicidal ideation. The patient is able to contract for safety in the  hospital.  2. Mood. She has been maintained on a combination of Zoloft, BuSpar, and trazodone. We started Lamictal for mood stabilization and Luvox for anxiety. Increase Luvox to 100  mg.  3. Insomnia. Trazodone is available.  4. Anxiety. Vistaril is available.  5. Metabolic syndrome monitoring. Lipid panel shows elevated cholesterol and triglycerides, TSH is normal, hemoglobin A1c pending.  6. EKG. Sinus bradycardia of 55, QTc 403.  7. Pregnancy test. Negative.  8. UTI. We will give a dose of Fosfomycin.   9. Disposition. She will be discharged to home with family. She will follow-up with Dr. Einar Grad and Lorinda Creed her therapist.  Orson Slick, MD 05/14/2017, 2:45 PM

## 2017-05-14 NOTE — Progress Notes (Signed)
D: Pt denies SI/HI/AVH. Patient's affect is bright, mood is pleasant and she is cooperative with treatment plan. Patient's thoughts are organized.  Pt appears less anxious and she is interacting with peers and staff appropriately.  A: Pt was offered support and encouragement. Pt was given her  scheduled medications. Pt was encouraged to attend groups. Q 15 minute checks were done for safety.  R:Pt attends groups and interacts appropriately with peers and staff. Pt is complaint with medication, no distress noted. Pt  Is receptive to treatment and safety maintained on unit.

## 2017-05-14 NOTE — Plan of Care (Signed)
Problem: Coping: Goal: Ability to verbalize frustrations and anger appropriately will improve Outcome: Progressing Patient verbalized frustrations to staff and ways to improve.

## 2017-05-14 NOTE — BHH Group Notes (Signed)
[facility] LCSW Group Therapy Note  Date/Time:05/14/2017, 9:30am  Type of Therapy/Topic:  Group Therapy:  Feelings about Diagnosis  Participation Level:  Minimal   Mood: Okay   Description of Group:    This group will allow patients to explore their thoughts and feelings about diagnoses they have received. Patients will be guided to explore their level of understanding and acceptance of these diagnoses. Facilitator will encourage patients to process their thoughts and feelings about the reactions of others to their diagnosis, and will guide patients in identifying ways to discuss their diagnosis with significant others in their lives. This group will be process-oriented, with patients participating in exploration of their own experiences as well as giving and receiving support and challenge from other group members.   Therapeutic Goals: 1. Patient will demonstrate understanding of diagnosis as evidence by identifying two or more symptoms of the disorder:  2. Patient will be able to express two feelings regarding the diagnosis 3. Patient will demonstrate ability to communicate their needs through discussion and/or role plays  Summary of Patient Progress:   Pt able to meet therapeutic goals.     Therapeutic Modalities:   Cognitive Behavioral Therapy Brief Therapy Feelings Identification     August Saucer, LCSW 05/14/2017, 3:58 PM

## 2017-05-14 NOTE — BHH Group Notes (Signed)
[facility] Group Notes:  (Nursing/MHT/Case Management/Adjunct)  Date:  05/14/2017  Time:  3:13 PM  Type of Therapy:  Psychoeducational Skills  Participation Level:  Active  Participation Quality:  Appropriate, Attentive and Sharing  Affect:  Appropriate  Cognitive:  Alert and Appropriate  Insight:  Appropriate and Good  Engagement in Group:  Engaged  Modes of Intervention:  Discussion, Education and Support  Summary of Progress/Problems:  Victoria Simmons 05/14/2017, 3:13 PM

## 2017-05-14 NOTE — Plan of Care (Signed)
Problem: Health Behavior/Discharge Planning: Goal: Compliance with treatment plan for underlying cause of condition will improve Outcome: Progressing Receptive to treatment regiment.

## 2017-05-15 LAB — HEMOGLOBIN A1C
Hgb A1c MFr Bld: 4.9 % (ref 4.8–5.6)
Mean Plasma Glucose: 94 mg/dL

## 2017-05-15 MED ORDER — FLUVOXAMINE MALEATE 100 MG PO TABS: 100.0000 mg | ORAL_TABLET | Freq: Every day | ORAL | 1 refills | Status: DC

## 2017-05-15 MED ORDER — TRAZODONE HCL 100 MG PO TABS: 100.0000 mg | ORAL_TABLET | Freq: Every day | ORAL | 1 refills | Status: DC

## 2017-05-15 MED ORDER — LAMOTRIGINE 25 MG PO TABS: 25.0000 mg | ORAL_TABLET | Freq: Every day | ORAL | 1 refills | Status: DC

## 2017-05-15 NOTE — Plan of Care (Signed)
Problem: Coping: Goal: Ability to demonstrate self-control will improve Outcome: Progressing Copes effectively and verbalizes intention to talk with someone if having difficulty coping with stressors

## 2017-05-15 NOTE — Progress Notes (Signed)
Pt calm, cooperative and pleasant.  Reports admission is due to "worsening depression and anxiety.  It had never gotten that bad before.  I was having thoughts of hurting myself.  It was because I was keeping everything to myself and not asking for help."  Denies current SI.  Contracts for safety.  "My mood is much better now.  I've learned how to cope, I'm not alone and that I need to talk about how I'm feeling."  RN provided encouragement and support of this.  Appropriately bright affect, participates and interacts in milieu. No behavioral issues.

## 2017-05-15 NOTE — BHH Counselor (Signed)
Adult Comprehensive Assessment  Patient ID: Victoria Simmons, female   DOB: 1995-11-21, 21 y.o.   MRN: [id number]  Information Source: Information source: Patient  Current Stressors:  Employment / Job issues: working extra due to other employees having quit: [employer], [employer].   Family Relationships: conflict at home with mom, brother and sister  Living/Environment/Situation:  Living Arrangements: Parent, Other relatives (mom, dad, brother, sister) Living conditions (as described by patient or guardian): "we but heads a lot because we are all so stubborn"  :There are good days." How long has patient lived in current situation?: whole life What is atmosphere in current home: Supportive  Family History:  Marital status: Single Are you sexually active?: No What is your sexual orientation?: heterosexual Has your sexual activity been affected by drugs, alcohol, medication, or emotional stress?: na Does patient have children?: No  Childhood History:  By [place] was/is the patient raised?: Both parents Additional childhood history information: Close family.  Some tension between parents when they moved to [place] because no family here. Description of patient's relationship with caregiver when they were a child: Mother: it was fine.  it began getting worse during teenage years but improved.  Father: good Patient's description of current relationship with people who raised him/her: Still get in "spats" with mom.  Good relationship with father. How were you disciplined when you got in trouble as a child/adolescent?: time out, sent to room, spanked Does patient have siblings?: Yes Number of Siblings: 2 Description of patient's current relationship with siblings: 2 year old brother, 1 year old sister.  Relationships "rocky" with siblings currently, but trying to work on it. Did patient suffer any verbal/emotional/physical/sexual abuse as a child?: No Did patient suffer from severe childhood neglect?:  No Has patient ever been sexually abused/assaulted/raped as an adolescent or adult?: No Was the patient ever a victim of a crime or a disaster?: No Witnessed domestic violence?: No Has patient been effected by domestic violence as an adult?: No  Education:  Highest grade of school patient has completed: HS diploma, 3 semesters of college: [place]. Currently a student?: Yes If yes, how has current illness impacted academic performance: It did last fall-anxiety/depression.  Failed some classes. 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, mining How long has patient been employed?: 18 months Patient's job has been impacted by current illness: Yes Describe how patient's job has been impacted: I call out a lot about 4-5 times monthly What is the longest time patient has a held a job?: 2 years Where was the patient employed at that time?: [place] Has patient ever been in the [employer]?: No Are There Guns or Other Weapons in [place]?: No  Financial Resources:   Financial resources: Income from employment, Support from parents / caregiver  Alcohol/Substance Abuse:   What has been your use of drugs/alcohol within the last 12 months?: Denies alcohol.  Marijuana: 1-2x week, 1 bowl.  6 months.  Denies other drugs. If attempted suicide, did drugs/alcohol play a role in this?: No Alcohol/Substance Abuse Treatment Hx: Denies past history Has alcohol/substance abuse ever caused legal problems?: No  Social Support System:   Patient's Community Support System: Good Describe Community Support System: friends, grandmother Type of faith/religion: none How does patient's faith help to cope with current illness?: na  Leisure/Recreation:   Leisure and Hobbies: going out with friends, shopping, [place],  relaxing in bed at home, movies,  restaurants  Strengths/Needs:   What things does the patient do well?: School path in place, family wedding coming up In what areas does patient struggle / problems for patient: work stress  Discharge Plan:   Does patient have access to transportation?: Yes (parents) Will patient be returning to same living situation after discharge?: Yes Currently receiving community mental health services: Yes (From [place]) ([facility], Dr [place] and [place]) Does patient have financial barriers related to discharge medications?: No  Summary/Recommendations:   Summary and Recommendations (to be completed by the evaluator): Pt is 21 year old female from [place]. [facility]  Pt is diagnosed with bipolar disorder and generalized anxiety disorder and was admitted due to increased depression and suicidal ideation.  Recommendations for pt include crisis stabilization, therapeutic milieu, attend and participate in groups, medication management, and development of comprhensive mental wellness plan.  Upon discharge, pt will return to oupatient services at [facility] [place].  [place]. 05/15/2017

## 2017-05-15 NOTE — Plan of Care (Signed)
Problem: Safety: Goal: Periods of time without injury will increase Outcome: Progressing Pt will remain free from injury the entire shift.

## 2017-05-15 NOTE — Progress Notes (Signed)
Recreation Therapy Notes  Date: 08.08.18 Time: 1:00 pm Location: Craft Room  Group Topic: Self-esteem  Goal Area(s) Addresses:  Patient will write at least one positive trait about self. Patient will verbalize benefit of having a healthy self-esteem.  Behavioral Response: Attentive, Interactive  Intervention: I Am  Activity: Patients were given a worksheet with the letter I on it and were instructed to write as many positive traits about themselves inside the letter.  Education: LRT educated patients on ways they can increase their self-esteem.  Education Outcome: Acknowledges education/In group clarification offered  Clinical Observations/Feedback: Patient wrote positive traits about self. Patient contributed to group discussion by stating that it was easy and difficult to think of positive traits, what makes it difficult to think of positive traits, how her self-esteem affects her, and how she can improve her self-esteem.  Leonette Monarch, LRT/CTRS 05/15/2017 1:44 PM

## 2017-05-15 NOTE — Progress Notes (Addendum)
Pt was very quiet and guarded. She spoke briefly. Pt remained in room most of shift. Pt was med compliant. Pt denies SI, HI or A/V hallucinations. 15 min safety checks continue.Pt slept 7 hrs.

## 2017-05-15 NOTE — Progress Notes (Signed)
[facility] MD Progress Note  05/15/2017 11:25 AM Victoria Simmons  MRN:  [id number]  Subjective:  05/14/2017. [name] feels better today. She slept well last night and is less anxious. She was able to participate in every activity on the unit in spite of anxiety. She still has suicidal ideations, yesterday and today, but has not been preoccupied with them entirely. She tolerates medications well. Parents visited last night. They are supportive. There are no somatic complaints.  05/15/2017. [name] met with treatment team today. She reports much improvement in her anxiety. Mood is better and affect brighter as well. She slept well last night. She has no somatic complaints or side effects of medications. She participated in every group since admission in spite of some anxiety. She interacts with peers and staff appropriately. She did not have suicidal thoughts since yesterday morning and believes she has been learning coping skills. She had visitors last night, both parents and two best friend. The visit went well. She expects her grandmother tonight. If no suicidal thinking, the patient will be discharged tomorrow.   Per nursing: Pt was very quiet and guarded. She spoke briefly. Pt remained in room most of shift. Pt was med compliant. Pt denies SI, HI or A/V hallucinations. 15 min safety checks continue.Pt slept 7 hrs.  Principal Problem: Bipolar 2 disorder, major depressive episode (Victoria Simmons) Diagnosis:   Patient Active Problem List   Diagnosis Date Noted  . Bipolar 2 disorder, major depressive episode (Victoria Simmons) [F31.81] 05/13/2017  . Suicidal ideation [R45.851] 05/13/2017  . Generalized anxiety disorder [F41.1] 05/13/2017  . Menorrhagia [N92.0] 01/25/2014  . UTI (urinary tract infection) [N39.0] 02/05/2013  . Anxiety state [F41.1] 01/26/2013  . Family history of hypercholesterolemia [Z83.42] 01/06/2013  . Murmur [R01.1] 01/06/2013   Total Time spent with patient: 30 minutes  Past Psychiatric History:  anxiety.  Past Medical History:  Past Medical History:  Diagnosis Date  . Ovarian torsion 2008    Past Surgical History:  Procedure Laterality Date  . OVARY SURGERY     Family History:  Family History  Problem Relation Age of Onset  . Hepatitis B Mother   . Anxiety disorder Mother   . Hypertension Father   . ADD / ADHD Brother    Family Psychiatric  History: depression, bipolar. Social History:  History  Alcohol Use No     History  Drug Use No    Social History   Social History  . Marital status: Single    Spouse name: N/A  . Number of children: N/A  . Years of education: N/A   Social History Main Topics  . Smoking status: Never Smoker  . Smokeless tobacco: Never Used  . Alcohol use No  . Drug use: No  . Sexual activity: No   Other Topics Concern  . None   Social History Narrative   ** Merged History Encounter **       Additional Social History:                         Sleep: Fair  Appetite:  Fair  Current Medications: Current Facility-Administered Medications  Medication Dose Route Frequency Provider Last Rate Last Dose  . acetaminophen (TYLENOL) tablet 650 mg  650 mg Oral Q6H PRN Pablo Mathurin B, MD      . alum & mag hydroxide-simeth (MAALOX/MYLANTA) 200-200-20 MG/5ML suspension 30 mL  30 mL Oral Q4H PRN Mayzee Reichenbach B, MD      .  fluvoxaMINE (LUVOX) tablet 100 mg  100 mg Oral QHS Varun Jourdan B, MD   100 mg at 05/14/17 2154  . fosfomycin (MONUROL) packet 3 g  3 g Oral Once Acsa Estey B, MD      . hydrOXYzine (ATARAX/VISTARIL) tablet 25 mg  25 mg Oral TID PRN Eden Rho B, MD      . lamoTRIgine (LAMICTAL) tablet 25 mg  25 mg Oral QHS Kasaundra Fahrney B, MD   25 mg at 05/14/17 2154  . magnesium hydroxide (MILK OF MAGNESIA) suspension 30 mL  30 mL Oral Daily PRN Ciaran Begay B, MD      . norgestimate-ethinyl estradiol (ORTHO-CYCLEN,SPRINTEC,PREVIFEM) 0.25-35 MG-MCG tablet 1 tablet  1 tablet Oral  Daily Darnelle Derrick B, MD   1 tablet at 05/15/17 1005  . traZODone (DESYREL) tablet 100 mg  100 mg Oral QHS Goro Wenrick B, MD   100 mg at 05/14/17 2154    Lab Results:  Results for orders placed or performed during the hospital encounter of 05/13/17 (from the past 48 hour(s))  Hemoglobin A1c     Status: None   Collection Time: 05/14/17  6:51 AM  Result Value Ref Range   Hgb A1c MFr Bld 4.9 4.8 - 5.6 %    Comment: (NOTE)         Pre-diabetes: 5.7 - 6.4         Diabetes: >6.4         Glycemic control for adults with diabetes: <7.0    Mean Plasma Glucose 94 mg/dL    Comment: (NOTE) Performed At: [facility] [place], [place] [id number] Lindon Romp MD [id number]   Lipid panel     Status: Abnormal   Collection Time: 05/14/17  6:51 AM  Result Value Ref Range   Cholesterol 254 (H) 0 - 200 mg/dL   Triglycerides 151 (H) <150 mg/dL   HDL 61 >40 mg/dL   Total CHOL/HDL Ratio 4.2 RATIO   VLDL 30 0 - 40 mg/dL   LDL Cholesterol 163 (H) 0 - 99 mg/dL    Comment:        Total Cholesterol/HDL:CHD Risk Coronary Heart Disease Risk Table                     Men   Women  1/2 Average Risk   3.4   3.3  Average Risk       5.0   4.4  2 X Average Risk   9.6   7.1  3 X Average Risk  23.4   11.0        Use the calculated Patient Ratio above and the CHD Risk Table to determine the patient's CHD Risk.        ATP III CLASSIFICATION (LDL):  <100     mg/dL   Optimal  100-129  mg/dL   Near or Above                    Optimal  130-159  mg/dL   Borderline  160-189  mg/dL   High  >190     mg/dL   Very High   TSH     Status: None   Collection Time: 05/14/17  6:51 AM  Result Value Ref Range   TSH 3.491 0.350 - 4.500 uIU/mL    Comment: Performed by a 3rd Generation assay with a functional sensitivity of <=0.01 uIU/mL.  CBC     Status: None   Collection  Time: 05/14/17  6:51 AM  Result Value Ref Range   WBC 5.0 3.6 - 11.0 K/uL   RBC 4.31 3.80 - 5.20 MIL/uL    Hemoglobin 12.5 12.0 - 16.0 g/dL   HCT 36.3 35.0 - 47.0 %   MCV 84.2 80.0 - 100.0 fL   MCH 29.0 26.0 - 34.0 pg   MCHC 34.4 32.0 - 36.0 g/dL   RDW 13.6 11.5 - 14.5 %   Platelets 244 150 - 440 K/uL  Comprehensive metabolic panel     Status: None   Collection Time: 05/14/17  6:51 AM  Result Value Ref Range   Sodium 140 135 - 145 mmol/L   Potassium 4.3 3.5 - 5.1 mmol/L   Chloride 109 101 - 111 mmol/L   CO2 25 22 - 32 mmol/L   Glucose, Bld 86 65 - 99 mg/dL   BUN 15 6 - 20 mg/dL   Creatinine, Ser 0.89 0.44 - 1.00 mg/dL   Calcium 9.4 8.9 - 10.3 mg/dL   Total Protein 7.2 6.5 - 8.1 g/dL   Albumin 3.6 3.5 - 5.0 g/dL   AST 16 15 - 41 U/L   ALT 18 14 - 54 U/L   Alkaline Phosphatase 53 38 - 126 U/L   Total Bilirubin 0.9 0.3 - 1.2 mg/dL   GFR calc non Af Amer >60 >60 mL/min   GFR calc Af Amer >60 >60 mL/min    Comment: (NOTE) The eGFR has been calculated using the CKD EPI equation. This calculation has not been validated in all clinical situations. eGFR's persistently <60 mL/min signify possible Chronic Kidney Disease.    Anion gap 6 5 - 15  Urinalysis, Complete w Microscopic     Status: Abnormal   Collection Time: 05/14/17  2:01 PM  Result Value Ref Range   Color, Urine YELLOW (A) YELLOW   APPearance CLEAR (A) CLEAR   Specific Gravity, Urine 1.011 1.005 - 1.030   pH 7.0 5.0 - 8.0   Glucose, UA NEGATIVE NEGATIVE mg/dL   Hgb urine dipstick SMALL (A) NEGATIVE   Bilirubin Urine NEGATIVE NEGATIVE   Ketones, ur NEGATIVE NEGATIVE mg/dL   Protein, ur NEGATIVE NEGATIVE mg/dL   Nitrite NEGATIVE NEGATIVE   Leukocytes, UA MODERATE (A) NEGATIVE   RBC / HPF 0-5 0 - 5 RBC/hpf   WBC, UA 6-30 0 - 5 WBC/hpf   Bacteria, UA NONE SEEN NONE SEEN   Squamous Epithelial / LPF 0-5 (A) NONE SEEN  Urine Drug Screen, Qualitative ([facility] only)     Status: None   Collection Time: 05/14/17  2:01 PM  Result Value Ref Range   Tricyclic, Ur Screen NONE DETECTED NONE DETECTED   Amphetamines, Ur Screen NONE  DETECTED NONE DETECTED   MDMA (Ecstasy)Ur Screen NONE DETECTED NONE DETECTED   Cocaine Metabolite,Ur  NONE DETECTED NONE DETECTED   Opiate, Ur Screen NONE DETECTED NONE DETECTED   Phencyclidine (PCP) Ur S NONE DETECTED NONE DETECTED   Cannabinoid 50 Ng, Ur  NONE DETECTED NONE DETECTED   Barbiturates, Ur Screen NONE DETECTED NONE DETECTED   Benzodiazepine, Ur Scrn NONE DETECTED NONE DETECTED   Methadone Scn, Ur NONE DETECTED NONE DETECTED    Comment: (NOTE) 638  Tricyclics, urine               Cutoff 1000 ng/mL 200  Amphetamines, urine             Cutoff 1000 ng/mL 300  MDMA (Ecstasy), urine  Cutoff 500 ng/mL 400  Cocaine Metabolite, urine       Cutoff 300 ng/mL 500  Opiate, urine                   Cutoff 300 ng/mL 600  Phencyclidine (PCP), urine      Cutoff 25 ng/mL 700  Cannabinoid, urine              Cutoff 50 ng/mL 800  Barbiturates, urine             Cutoff 200 ng/mL 900  Benzodiazepine, urine           Cutoff 200 ng/mL 1000 Methadone, urine                Cutoff 300 ng/mL 1100 1200 The urine drug screen provides only a preliminary, unconfirmed 1300 analytical test result and should not be used for non-medical 1400 purposes. Clinical consideration and professional judgment should 1500 be applied to any positive drug screen result due to possible 1600 interfering substances. A more specific alternate chemical method 1700 must be used in order to obtain a confirmed analytical result.  1800 Gas chromato graphy / mass spectrometry (GC/MS) is the preferred 1900 confirmatory method.   Pregnancy, urine     Status: None   Collection Time: 05/14/17  2:01 PM  Result Value Ref Range   Preg Test, Ur NEGATIVE NEGATIVE    Blood Alcohol level:  No results found for: [facility]  Metabolic Disorder Labs: Lab Results  Component Value Date   HGBA1C 4.9 05/14/2017   MPG 94 05/14/2017   No results found for: PROLACTIN Lab Results  Component Value Date   CHOL 254 (H) 05/14/2017    TRIG 151 (H) 05/14/2017   HDL 61 05/14/2017   CHOLHDL 4.2 05/14/2017   VLDL 30 05/14/2017   LDLCALC 163 (H) 05/14/2017    Physical Findings: AIMS: Facial and Oral Movements Muscles of Facial Expression: None, normal Lips and Perioral Area: None, normal Jaw: None, normal Tongue: None, normal,Extremity Movements Upper (arms, wrists, hands, fingers): None, normal Lower (legs, knees, ankles, toes): None, normal, Trunk Movements Neck, shoulders, hips: None, normal, Overall Severity Severity of abnormal movements (highest score from questions above): None, normal Incapacitation due to abnormal movements: None, normal Patient's awareness of abnormal movements (rate only patient's report): No Awareness, Dental Status Current problems with teeth and/or dentures?: No Does patient usually wear dentures?: No  CIWA:  CIWA-Ar Total: 0 COWS:  COWS Total Score: 0  Musculoskeletal: Strength & Muscle Tone: within normal limits Gait & Station: normal Patient leans: N/A  Psychiatric Specialty Exam: Physical Exam  Nursing note and vitals reviewed. Psychiatric: Her speech is normal and behavior is normal. Thought content normal. Her mood appears anxious. Cognition and memory are normal. She expresses impulsivity.    Review of Systems  Psychiatric/Behavioral: Positive for suicidal ideas. The patient is nervous/anxious and has insomnia.   All other systems reviewed and are negative.   Blood pressure 104/70, pulse 96, temperature 98.4 F (36.9 C), temperature source Oral, resp. rate 18, height _0  (1.651 m), weight 59.9 kg (132 lb), SpO2 100 %.Body mass index is 21.97 kg/m.  General Appearance: Casual  Eye Contact:  Good  Speech:  Clear and Coherent  Volume:  Normal  Mood:  Anxious  Affect:  Appropriate  Thought Process:  Goal Directed and Descriptions of Associations: Intact  Orientation:  Full (Time, Place, and Person)  Thought Content:  WDL  Suicidal Thoughts:  Yes.  with intent/plan   Homicidal Thoughts:  No  Memory:  Immediate;   Fair Recent;   Fair Remote;   Fair  Judgement:  Fair  Insight:  Fair  Psychomotor Activity:  Normal  Concentration:  Concentration: Fair and Attention Span: Fair  Recall:  [employer] of Knowledge:  Fair  Language:  Fair  Akathisia:  No  Handed:  Right  AIMS (if indicated):     Assets:  Communication Skills Desire for Improvement Financial Resources/Insurance Housing Physical Health Resilience Social Support Vocational/Educational  ADL's:  Intact  Cognition:  WNL  Sleep:  Number of Hours: 7     Treatment Plan Summary: Daily contact with patient to assess and evaluate symptoms and progress in treatment and Medication management   Ms. Lahm is a 21 year old female with history of severe anxiety admitted for suicidal thinking with a plan.  1. Suicidal ideation. The patient is able to contract for safety in the hospital.  2. Mood. She has been maintained on a combination of Zoloft, BuSpar, and trazodone. We started Lamictal for mood stabilization and Luvox for anxiety. Tolerates iIncreased dose of Luvox well.   3. Insomnia. Trazodone is available.  4. Anxiety. Vistaril is available.  5. Metabolic syndrome monitoring. Lipid panel shows elevated cholesterol and triglycerides, TSH and hemoglobin A1c are normal.   6. EKG. Sinus bradycardia of 55, QTc 403.  7. Pregnancy test. Negative.  8. UTI. We gave a dose of Fosfomycin.   9. Disposition. She will be discharged to home with family. She will follow-up with Dr. Einar Grad and Lorinda Creed, her therapist.  Orson Slick, MD 05/15/2017, 11:25 AM

## 2017-05-15 NOTE — BHH Group Notes (Signed)
  [place] LCSW Group Therapy Note  Date/Time: 05/15/17, 0930  Type of Therapy/Topic:  Group Therapy:  Emotion Regulation  Participation Level:  Active   Mood:pleasant  Description of Group:    The purpose of this group is to assist patients in learning to regulate negative emotions and experience positive emotions. Patients will be guided to discuss ways in which they have been vulnerable to their negative emotions. These vulnerabilities will be juxtaposed with experiences of positive emotions or situations, and patients challenged to use positive emotions to combat negative ones. Special emphasis will be placed on coping with negative emotions in conflict situations, and patients will process healthy conflict resolution skills.  Therapeutic Goals: 1. Patient will identify two positive emotions or experiences to reflect on in order to balance out negative emotions:  2. Patient will label two or more emotions that they find the most difficult to experience:  3. Patient will be able to demonstrate positive conflict resolution skills through discussion or role plays:   Summary of Patient Progress: Pt very active in group today and very positive.  Pt shared that sadness, anger, and anxiety are difficult emotions for her to experience and also shared that talking to a friend, listening to music, or exercising are ways she has been able to manage those emotions.  Pt appropriate in group and also had positive interactions with other group members.       Therapeutic Modalities:   Cognitive Behavioral Therapy Feelings Identification Dialectical Behavioral Therapy  Lurline Idol, LCSW

## 2017-05-16 NOTE — Plan of Care (Signed)
Problem: Coping: Goal: Ability to verbalize frustrations and anger appropriately will improve Outcome: Progressing Patient verbalized feelings to nurse.

## 2017-05-16 NOTE — BHH Group Notes (Signed)
[place] LCSW Group Therapy Note  Date/Time: 05/16/17, 0930  Type of Therapy/Topic:  Group Therapy:  Balance in Life  Participation Level:  active  Description of Group:    This group will address the concept of balance and how it feels and looks when one is unbalanced. Patients will be encouraged to process areas in their lives that are out of balance, and identify reasons for remaining unbalanced. Facilitators will guide patients utilizing problem- solving interventions to address and correct the stressor making their life unbalanced. Understanding and applying boundaries will be explored and addressed for obtaining  and maintaining a balanced life. Patients will be encouraged to explore ways to assertively make their unbalanced needs known to significant others in their lives, using other group members and facilitator for support and feedback.  Therapeutic Goals: 1. Patient will identify two or more emotions or situations they have that consume much of in their lives. 2. Patient will identify signs/triggers that life has become out of balance:  3. Patient will identify two ways to set boundaries in order to achieve balance in their lives:  4. Patient will demonstrate ability to communicate their needs through discussion and/or role plays  Summary of Patient Progress: Pt identified mental/emotional, financial, and family relationships as areas that are out of balance.  She reported that she is very bad with her money and knows she needs to start saving some.  She also likes living at home but feels a little too much family sometimes.  She does feel like her mental/emotional concerns are doing better since her admission.  Good participation.          Therapeutic Modalities:   Cognitive Behavioral Therapy Solution-Focused Therapy Assertiveness Training  Lurline Idol, [place]

## 2017-05-16 NOTE — Tx Team (Signed)
Interdisciplinary Treatment and Diagnostic Plan Update  05/16/2017 Time of Session: 10:30am LEVY WELLMAN MRN: [id number]  Principal Diagnosis: Bipolar 2 disorder, major depressive episode ([place])  Secondary Diagnoses: Principal Problem:   Bipolar 2 disorder, major depressive episode ([place]) Active Problems:   UTI (urinary tract infection)   Suicidal ideation   Generalized anxiety disorder   Current Medications:  Current Facility-Administered Medications  Medication Dose Route Frequency Provider Last Rate Last Dose  . acetaminophen (TYLENOL) tablet 650 mg  650 mg Oral Q6H PRN Pucilowska, Jolanta B, MD      . alum & mag hydroxide-simeth (MAALOX/MYLANTA) 200-200-20 MG/5ML suspension 30 mL  30 mL Oral Q4H PRN Pucilowska, Jolanta B, MD      . fluvoxaMINE (LUVOX) tablet 100 mg  100 mg Oral QHS Pucilowska, Jolanta B, MD   100 mg at 05/15/17 2227  . fosfomycin (MONUROL) packet 3 g  3 g Oral Once Pucilowska, Jolanta B, MD      . hydrOXYzine (ATARAX/VISTARIL) tablet 25 mg  25 mg Oral TID PRN Pucilowska, Jolanta B, MD      . lamoTRIgine (LAMICTAL) tablet 25 mg  25 mg Oral QHS Pucilowska, Jolanta B, MD   25 mg at 05/15/17 2228  . magnesium hydroxide (MILK OF MAGNESIA) suspension 30 mL  30 mL Oral Daily PRN Pucilowska, Jolanta B, MD      . norgestimate-ethinyl estradiol (ORTHO-CYCLEN,SPRINTEC,PREVIFEM) 0.25-35 MG-MCG tablet 1 tablet  1 tablet Oral Daily Pucilowska, Jolanta B, MD   1 tablet at 05/15/17 1005  . traZODone (DESYREL) tablet 100 mg  100 mg Oral QHS Pucilowska, Jolanta B, MD   100 mg at 05/15/17 2228   PTA Medications: Facility-Administered Medications Prior to Admission  Medication Dose Route Frequency Provider Last Rate Last Dose  . 0.9 %  sodium chloride infusion  500 mL Intravenous Continuous Armbruster, Renelda Loma, MD       Prescriptions Prior to Admission  Medication Sig Dispense Refill Last Dose  . busPIRone (BUSPAR) 10 MG tablet Take 1 tablet (10 mg total) by mouth daily. 60  tablet 1   . oseltamivir (TAMIFLU) 75 MG capsule Take 1 capsule (75 mg total) by mouth 2 (two) times daily. 10 capsule 0 Taking  . sertraline (ZOLOFT) 100 MG tablet Take 1.5 tablets (150 mg total) by mouth daily. 45 tablet 1 Taking  . SPRINTEC 28 0.25-35 MG-MCG tablet Take 1 tablet by mouth daily.  11 Taking  . SPRINTEC 28 0.25-35 MG-MCG tablet TAKE 1 TABLET BY MOUTH EVERY DAY 28 tablet 0 Taking  . [DISCONTINUED] traZODone (DESYREL) 100 MG tablet Take 1 tablet (100 mg total) by mouth at bedtime. 30 tablet 1 Taking    Patient Stressors: Educational concerns Marital or family conflict  Patient Strengths: Ability for insight Average or above average intelligence Capable of independent living Curator fund of knowledge  Treatment Modalities: Medication Management, Group therapy, Case management,  1 to 1 session with clinician, Psychoeducation, Recreational therapy.   Physician Treatment Plan for Primary Diagnosis: Bipolar 2 disorder, major depressive episode ([place]) Long Term Goal(s): Improvement in symptoms so as ready for discharge NA   Short Term Goals: Ability to identify changes in lifestyle to reduce recurrence of condition will improve Ability to verbalize feelings will improve Ability to disclose and discuss suicidal ideas Ability to demonstrate self-control will improve Ability to identify and develop effective coping behaviors will improve Ability to maintain clinical measurements within normal limits will improve Compliance with prescribed medications will improve Ability to identify  triggers associated with substance abuse/mental health issues will improve NA  Medication Management: Evaluate patient's response, side effects, and tolerance of medication regimen.  Therapeutic Interventions: 1 to 1 sessions, Unit Group sessions and Medication administration.  Evaluation of Outcomes: Progressing  Physician Treatment Plan for Secondary Diagnosis:  Principal Problem:   Bipolar 2 disorder, major depressive episode ([place]) Active Problems:   UTI (urinary tract infection)   Suicidal ideation   Generalized anxiety disorder  Long Term Goal(s): Improvement in symptoms so as ready for discharge NA   Short Term Goals: Ability to identify changes in lifestyle to reduce recurrence of condition will improve Ability to verbalize feelings will improve Ability to disclose and discuss suicidal ideas Ability to demonstrate self-control will improve Ability to identify and develop effective coping behaviors will improve Ability to maintain clinical measurements within normal limits will improve Compliance with prescribed medications will improve Ability to identify triggers associated with substance abuse/mental health issues will improve NA     Medication Management: Evaluate patient's response, side effects, and tolerance of medication regimen.  Therapeutic Interventions: 1 to 1 sessions, Unit Group sessions and Medication administration.  Evaluation of Outcomes: Progressing   RN Treatment Plan for Primary Diagnosis: Bipolar 2 disorder, major depressive episode ([place]) Long Term Goal(s): Knowledge of disease and therapeutic regimen to maintain health will improve  Short Term Goals: Ability to remain free from injury will improve, Ability to participate in decision making will improve, Ability to identify and develop effective coping behaviors will improve and Compliance with prescribed medications will improve  Medication Management: RN will administer medications as ordered by provider, will assess and evaluate patient's response and provide education to patient for prescribed medication. RN will report any adverse and/or side effects to prescribing provider.  Therapeutic Interventions: 1 on 1 counseling sessions, Psychoeducation, Medication administration, Evaluate responses to treatment, Monitor vital signs and CBGs as ordered,  Perform/monitor CIWA, COWS, AIMS and Fall Risk screenings as ordered, Perform wound care treatments as ordered.  Evaluation of Outcomes: Progressing   LCSW Treatment Plan for Primary Diagnosis: Bipolar 2 disorder, major depressive episode ([place]) Long Term Goal(s): Safe transition to appropriate next level of care at discharge, Engage patient in therapeutic group addressing interpersonal concerns.  Short Term Goals: Engage patient in aftercare planning with referrals and resources, Increase emotional regulation and Increase skills for wellness and recovery  Therapeutic Interventions: Assess for all discharge needs, 1 to 1 time with Social worker, Explore available resources and support systems, Assess for adequacy in community support network, Educate family and significant other(s) on suicide prevention, Complete Psychosocial Assessment, Interpersonal group therapy.  Evaluation of Outcomes: Progressing   Progress in Treatment: Attending groups: Yes. Participating in groups: Yes. Taking medication as prescribed: Yes. Toleration medication: Yes. Family/Significant other contact made: Yes, individual(s) contacted:  mother Patient understands diagnosis: Yes. Discussing patient identified problems/goals with staff: No. Medical problems stabilized or resolved: Yes. Denies suicidal/homicidal ideation: Yes. Issues/concerns per patient self-inventory: No. Other: n/a  New problem(s) identified: None identified at this time.   New Short Term/Long Term Goal(s): Patient goal is to be discharged and continue follow-up services and communicate with her support system.   Discharge Plan or Barriers: Patient will discharge home and follow-up with [facility] for outpatient services.   Reason for Continuation of Hospitalization: Medication stabilization  Estimated Length of Stay: Anticipated discharge 05/16/2017  Attendees: Patient: Victoria Simmons 05/16/2017 9:25 AM  Physician: Dr.  Orson Slick, MD 05/16/2017 9:25 AM  Nursing: Polly Cobia,  RN 05/16/2017 9:25 AM  RN Care Manager: 05/16/2017 9:25 AM  Social Worker: Lear Ng. Claybon Jabs MSW, [place] 05/16/2017 9:25 AM  Recreational Therapist: Leonette Monarch, LRT/CTRS 05/16/2017 9:25 AM  Other:  05/16/2017 9:25 AM  Other:  05/16/2017 9:25 AM  Other: 05/16/2017 9:25 AM    Scribe for Treatment Team: Jolaine Click, LCSWA 05/16/2017 9:25 AM

## 2017-05-16 NOTE — BHH Suicide Risk Assessment (Signed)
[place] INPATIENT:  Family/Significant Other Suicide Prevention Education  Suicide Prevention Education:  Education Completed; Dawn Zimbelman(mother [phone]), has been identified by the patient as the family member/significant other with whom the patient will be residing, and identified as the person(s) who will aid the patient in the event of a mental health crisis (suicidal ideations/suicide attempt).  With written consent from the patient, the family member/significant other has been provided the following suicide prevention education, prior to the and/or following the discharge of the patient.  The suicide prevention education provided includes the following:  Suicide risk factors  Suicide prevention and interventions  National Suicide Hotline telephone number  [facility] assessment telephone number  [facility] Emergency Assistance [place] and/or Residential Mobile Crisis Unit telephone number  Request made of family/significant other to:  Remove weapons (e.g., guns, rifles, knives), all items previously/currently identified as safety concern.    Remove drugs/medications (over-the-counter, prescriptions, illicit drugs), all items previously/currently identified as a safety concern.  The family member/significant other verbalizes understanding of the suicide prevention education information provided.  The family member/significant other agrees to remove the items of safety concern listed above.  Linn Goetze G. Atkins, [place] 05/16/2017, 9:21 AM

## 2017-05-16 NOTE — Progress Notes (Signed)
  [facility] Adult Case Management Discharge Plan :  Will you be returning to the same living situation after discharge:  Yes,  with family At discharge, do you have transportation home?: Yes,  mother Do you have the ability to pay for your medications: Yes,  patient has insurance  Release of information consent forms completed and in the chart;  Patient's signature needed at discharge.  Patient to Follow up at: Follow-up Information    [facility]. Go on 05/22/2017.   Specialty:  Behavioral Health Why:  9:30am follow-up appointment with Dr. Einar Grad for medication management. Follow-up appointment with your therapist Elmyra Ricks on 05/28/2017 at 10:00am.  Contact information: [place] [phone]          Next level of care provider has access to [place] and Suicide Prevention discussed: Yes,  with patient and mother  Have you used any form of tobacco in the last 30 days? (Cigarettes, Smokeless Tobacco, Cigars, and/or Pipes): No  Has patient been referred to the Quitline?: N/A patient is not a smoker  Patient has been referred for addiction treatment: Yes  Jolaine Click, [place] 05/16/2017, 9:24 AM

## 2017-05-16 NOTE — BHH Suicide Risk Assessment (Signed)
[facility] Discharge Suicide Risk Assessment   Principal Problem: Bipolar 2 disorder, major depressive episode [facility]) Discharge Diagnoses:  Patient Active Problem List   Diagnosis Date Noted  . Bipolar 2 disorder, major depressive episode ([place]) [F31.81] 05/13/2017  . Suicidal ideation [R45.851] 05/13/2017  . Generalized anxiety disorder [F41.1] 05/13/2017  . Menorrhagia [N92.0] 01/25/2014  . UTI (urinary tract infection) [N39.0] 02/05/2013  . Anxiety state [F41.1] 01/26/2013  . Family history of hypercholesterolemia [Z83.42] 01/06/2013  . Murmur [R01.1] 01/06/2013    Total Time spent with patient: 30 minutes  Musculoskeletal: Strength & Muscle Tone: within normal limits Gait & Station: normal Patient leans: N/A  Psychiatric Specialty Exam: Review of Systems  Psychiatric/Behavioral: The patient is nervous/anxious.   All other systems reviewed and are negative.   Blood pressure 110/64, pulse 99, temperature 98.5 F (36.9 C), resp. rate 18, height 5\' 5"  (1.651 m), weight 59.9 kg (132 lb), SpO2 100 %.Body mass index is 21.97 kg/m.  General Appearance: Casual  Eye Contact::  Good  Speech:  Clear and Coherent409  Volume:  Normal  Mood:  Euthymic  Affect:  Appropriate  Thought Process:  Goal Directed and Descriptions of Associations: Intact  Orientation:  Full (Time, Place, and Person)  Thought Content:  WDL  Suicidal Thoughts:  No  Homicidal Thoughts:  No  Memory:  Immediate;   Fair Recent;   Fair Remote;   Fair  Judgement:  Fair  Insight:  Fair  Psychomotor Activity:  Normal  Concentration:  Fair  Recall:  [employer] [place]  Language: Fair  Akathisia:  No  Handed:  Right  AIMS (if indicated):     Assets:  Communication Skills Desire for Improvement Financial Resources/Insurance Housing Physical Health Resilience Social Support Vocational/Educational  Sleep:  Number of Hours: 6.75  Cognition: WNL  ADL's:  Intact   Mental Status Per Nursing Assessment::    On Admission:     Demographic Factors:  Adolescent or young adult and Caucasian  Loss Factors: NA  Historical Factors: Impulsivity  Risk Reduction Factors:   Sense of responsibility to family, Employed, Living with another person, especially a relative, Positive social support and Positive therapeutic relationship  Continued Clinical Symptoms:  Severe Anxiety and/or Agitation Bipolar Disorder:   Bipolar II Depressive phase  Cognitive Features That Contribute To Risk:  None    Suicide Risk:  Minimal: No identifiable suicidal ideation.  Patients presenting with no risk factors but with morbid ruminations; may be classified as minimal risk based on the severity of the depressive symptoms    Plan Of Care/Follow-up recommendations:  Activity:  as tolerated. Diet:  low sodium heart healthy. Other:  keep follow up appointments.  Orson Slick, MD 05/16/2017, 9:20 AM

## 2017-05-16 NOTE — Progress Notes (Signed)
Pt calm, cooperative and pleasant.  Bright affect.  Interacting appropriately with staff and peers.  Denies SI/HI/AVH.

## 2017-05-16 NOTE — Discharge Summary (Signed)
Physician Discharge Summary Note  Patient:  Victoria Simmons is an 21 y.o., female MRN:  [id number] DOB:  10-24-1995 Patient phone:  [phone] (home)  Patient address:   [place] [address],  Total Time spent with patient: 30 minutes  Date of Admission:  05/13/2017 Date of Discharge: 05/16/2017  Reason for Admission:  Suicidal ideation.  Identifying data. [name] is a 22 year old female with history of severe anxiety.  Chief complaint. "I can't stop thinking about suicide."  History of present illness. Information was obtained from the patient and the chart. The patient was brought to our unit following a visit with her therapist with concerns for her safety. For the past couple weeks the patient has continuous ruminations about suicide. She has been researching Internet to find a good way to kill herself but was not forthcoming about the method. The patient has a long history of anxiety "as long as I can remember". She was started on the Zoloft in 2017 and initially responded very well to it. BuSpar and trazodone were added to her regimen but gradually medications stopped working. Since May the patient has been feeling worse. She denies symptoms of depression and was forward symptoms of anxiety with panic attacks generalized anxiety and social anxiety. She denies compulsions, but has been obsessive. In May her Zoloft was discontinued and BuSpar increased with no improvement. 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 She denies symptoms suggestive of bipolar mania but does report frequent and profound mood swings, irritability, poor impulse control, hyperactivity, and insomnia. She denies alcohol or illicit substance use.  Past psychiatric history. She has never attempted suicide. She was treated with Prozac and when in high school with no improvement. She is  currently a patient of Dr. Einar Simmons and sees a therapist. Family psychiatric history. Grandmother and mother with severe anxiety. Mother on the Xanax. Grandmother's sister has bipolar illness with distinct manic episodes.  Social history. 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 At the end of her freshman year and first semester of sophomore year she started experiencing incapacitating anxiety and suffered from somatic symptoms mostly abdominal pain, nausea, vomiting that were already investigated by her internist. She had to withdraw from school and has been suspended during spring semester. She intends to return to community college in 2 weeks to study criminal justice but wants to start with one course only. She works at the [employer] close to where she lives as she still does not have a driver's license due to anxiety. She is with her parents and 2 younger siblings.   Principal Problem: Bipolar 2 disorder, major depressive episode Victoria Simmons) Discharge Diagnoses: Patient Active Problem List   Diagnosis Date Noted  . Bipolar 2 disorder, major depressive episode ([place]) [F31.81] 05/13/2017  . Suicidal ideation [R45.851] 05/13/2017  . Generalized anxiety disorder [F41.1] 05/13/2017  . Menorrhagia [N92.0] 01/25/2014  . UTI (urinary tract infection) [N39.0] 02/05/2013  . Anxiety state [F41.1] 01/26/2013  . Family history of hypercholesterolemia [Z83.42] 01/06/2013  . Murmur [R01.1] 01/06/2013     Past Medical History:  Past Medical History:  Diagnosis Date  . Ovarian torsion 2008    Past Surgical History:  Procedure Laterality Date  . OVARY SURGERY     Family History:  Family History  Problem Relation Age of Onset  . Hepatitis B Mother   .  Anxiety disorder Mother   . Hypertension Father   . ADD / ADHD Brother    Social History:  History  Alcohol Use No     History  Drug Use No    Social History   Social History  . Marital status: Single     Spouse name: N/A  . Number of children: N/A  . Years of education: N/A   Social History Main Topics  . Smoking status: Never Smoker  . Smokeless tobacco: Never Used  . Alcohol use No  . Drug use: No  . Sexual activity: No   Other Topics Concern  . None   Social History Narrative   ** Merged History Encounter **        Hospital Course:    [name] is a 21 year old female with a history of severe anxiety admitted for suicidal thinking with a plan.  1. Suicidal ideation. Resolved. The patient denies any thoughts, intentions, or plans to hurt herself or others. She is able to contract for safety. She is forward thinking and optimistic about the future. She is a loving daughter and sister.  2. Mood. We discontinued Zoloft and BuSpar and started Lamictal for mood stabilization and Luvox for anxiety.  3. Insomnia.  We continued Trazodone.   4. Anxiety. Vistaril was available.  5. Metabolic syndrome monitoring. Lipid panel revealed elevated cholesterol and triglycerides and TSH and hemoglobin A1c.   6. EKG. QTc 403.  7. Pregnancy test. Negative.  8. UTI. We gave a dose of Fosfomycin.   9. Disposition. She was discharged to home with family. She will follow-up with Dr. Einar Simmons and Victoria Simmons her therapist.  Physical Findings: AIMS: Facial and Oral Movements Muscles of Facial Expression: None, normal Lips and Perioral Area: None, normal Jaw: None, normal Tongue: None, normal,Extremity Movements Upper (arms, wrists, hands, fingers): None, normal Lower (legs, knees, ankles, toes): None, normal, Trunk Movements Neck, shoulders, hips: None, normal, Overall Severity Severity of abnormal movements (highest score from questions above): None, normal Incapacitation due to abnormal movements: None, normal Patient's awareness of abnormal movements (rate only patient's report): No Awareness, Dental Status Current problems with teeth and/or dentures?: No Does patient usually wear  dentures?: No  CIWA:  CIWA-Ar Total: 0 COWS:  COWS Total Score: 0  Musculoskeletal: Strength & Muscle Tone: within normal limits Gait & Station: normal Patient leans: N/A  Psychiatric Specialty Exam: Physical Exam  Nursing note and vitals reviewed. Psychiatric: She has a normal mood and affect. Her speech is normal and behavior is normal. Judgment and thought content normal. Cognition and memory are normal.    Review of Systems  Psychiatric/Behavioral: The patient is nervous/anxious.   All other systems reviewed and are negative.   Blood pressure 110/64, pulse 99, temperature 98.5 F (36.9 C), resp. rate 18, height 5\' 5"  (1.651 m), weight 59.9 kg (132 lb), SpO2 100 %.Body mass index is 21.97 kg/m.  General Appearance: Casual  Eye Contact:  Good  Speech:  Clear and Coherent  Volume:  Normal  Mood:  Euthymic  Affect:  Appropriate  Thought Process:  Goal Directed and Descriptions of Associations: Intact  Orientation:  Full (Time, Place, and Person)  Thought Content:  WDL  Suicidal Thoughts:  No  Homicidal Thoughts:  No  Memory:  Immediate;   Fair Recent;   Fair Remote;   Fair  Judgement:  Fair  Insight:  Fair  Psychomotor Activity:  Normal  Concentration:  Concentration: Fair and Attention Span: Fair  Recall:  Fair  Fund of Knowledge:  Fair  Language:  Fair  Akathisia:  No  Handed:  Right  AIMS (if indicated):     Assets:  Communication Skills Desire for Improvement Financial Resources/Insurance Intimacy Physical Health Resilience Vocational/Educational  ADL's:  Intact  Cognition:  WNL  Sleep:  Number of Hours: 6.75     Have you used any form of tobacco in the last 30 days? (Cigarettes, Smokeless Tobacco, Cigars, and/or Pipes): No  Has this patient used any form of tobacco in the last 30 days? (Cigarettes, Smokeless Tobacco, Cigars, and/or Pipes) Yes, No  Blood Alcohol level:  No results found for: [facility]  Metabolic Disorder Labs:  Lab Results  Component  Value Date   HGBA1C 4.9 05/14/2017   MPG 94 05/14/2017   No results found for: PROLACTIN Lab Results  Component Value Date   CHOL 254 (H) 05/14/2017   TRIG 151 (H) 05/14/2017   HDL 61 05/14/2017   CHOLHDL 4.2 05/14/2017   VLDL 30 05/14/2017   LDLCALC 163 (H) 05/14/2017    See Psychiatric Specialty Exam and Suicide Risk Assessment completed by Attending Physician prior to discharge.  Discharge destination:  Home  Is patient on multiple antipsychotic therapies at discharge:  No   Has Patient had three or more failed trials of antipsychotic monotherapy by history:  No  Recommended Plan for Multiple Antipsychotic Therapies: NA   Allergies as of 05/16/2017   No Known Allergies     Medication List    STOP taking these medications   busPIRone 10 MG tablet Commonly known as:  BUSPAR   oseltamivir 75 MG capsule Commonly known as:  TAMIFLU   sertraline 100 MG tablet Commonly known as:  ZOLOFT     TAKE these medications     Indication  fluvoxaMINE 100 MG tablet Commonly known as:  LUVOX Take 1 tablet (100 mg total) by mouth at bedtime.  Indication:  Panic Disorder   lamoTRIgine 25 MG tablet Commonly known as:  LAMICTAL Take 1 tablet (25 mg total) by mouth at bedtime. Take 1 pill for 2 weeks, 2 pills for 2 weeks, 4 pills for 2 weeks, 8 pills or 200 mg after.  Indication:  Manic-Depression   SPRINTEC 28 0.25-35 MG-MCG tablet Generic drug:  norgestimate-ethinyl estradiol Take 1 tablet by mouth daily.  Indication:  Birth Control Treatment   SPRINTEC 28 0.25-35 MG-MCG tablet Generic drug:  norgestimate-ethinyl estradiol TAKE 1 TABLET BY MOUTH EVERY DAY  Indication:  Birth Control Treatment   traZODone 100 MG tablet Commonly known as:  DESYREL Take 1 tablet (100 mg total) by mouth at bedtime.  Indication:  Trouble Sleeping        Follow-up recommendations:  Activity:  as tolerated. Diet:  low sodium heart healthy. Other:  keep follow up  appointments.  Comments:    Signed: Orson Slick, MD 05/16/2017, 9:20 AM

## 2017-05-16 NOTE — [facility] Group Notes (Signed)
Goals Group Date/Time: 05/16/2017 9:00 AM Type of Therapy and Topic: Group Therapy: Goals Group: SMART Goals   Participation Level: Moderate  Description of Group:    The purpose of a daily goals group is to assist and guide patients in setting recovery/wellness-related goals. The objective is to set goals as they relate to the crisis in which they were admitted. Patients will be using SMART goal modalities to set measurable goals. Characteristics of realistic goals will be discussed and patients will be assisted in setting and processing how one will reach their goal. Facilitator will also assist patients in applying interventions and coping skills learned in psycho-education groups to the SMART goal and process how one will achieve defined goal.   Therapeutic Goals:   -Patients will develop and document one goal related to or their crisis in which brought them into treatment.  -Patients will be guided by LCSW using SMART goal setting modality in how to set a measurable, attainable, realistic and time sensitive goal.  -Patients will process barriers in reaching goal.  -Patients will process interventions in how to overcome and successful in reaching goal.   Patient's Goal: Pt discharging today and identified daily goal of attending groups until she leaves.  CSW asked her to think about a longer term goal.  She identified keeping her grades up at school and shared that she had done poorly at [employer] and is going to be attending [employer] this fall and hopes to improve her study skills to have more success.   Therapeutic Modalities:  Motivational Interviewing  Art gallery manager  SMART goals setting   Lurline Idol, 

## 2017-05-16 NOTE — Progress Notes (Signed)
D: Patient is aware of  Discharge this shift .Patient denies suicidal /homicidal ideations. Patient received all belongings brought in   A: No Storage medications. Writer reviewed Discharge Summary, Suicide Risk Assessment, and Transitional Record. Patient also received Prescriptions   from  MD. Aware  Of follow up appointment .  R: Patient left unit with no questions  Or concerns  With mother . 

## 2017-05-16 NOTE — Plan of Care (Signed)
Problem: [facility] Participation in Recreation Therapeutic Interventions Goal: STG-Patient will identify at least five coping skills for ** STG: Coping Skills - Within 3 treatment sessions, patient will verbalize at least 5 coping skills for anger in each of 2 treatment sessions to increase anger management skills.  Outcome: Completed/Met Date Met: 05/16/17 Treatment Session 1; Completed 1 out of 1: At approximately 10:15 am, LRT met with patient in craft room. Patient verbalized 5 coping skills for anger. Patient verbalized what triggers her to get angry, how her body responds to anger, and how she can remind herself to use her healthy coping skills. LRT provided suggestions as well.  Leonette Monarch, LRT/CTRS 08.09.18 10:52 am Goal: STG-Other Recreation Therapy Goal (Specify) STG: Stress Management - Within 3 treatment sessions, patient will verbalize understanding of the stress management techniques in one treatment session to increase stress management skills.  Outcome: Completed/Met Date Met: 05/16/17 Treatment Session 1; Completed 1 out of 1: At approximately 10:15 am, LRT met with patient in craft room. LRT educated and provided patient with handouts on stress management techniques. Patient verbalized understanding. LRT encouraged patient to read over and practice the stress management techniques.  Leonette Monarch, LRT/CTRS 08.09.18 10:53 am

## 2017-05-16 NOTE — Progress Notes (Signed)
Recreation Therapy Notes  INPATIENT RECREATION THERAPY ASSESSMENT *Late Entry (8/8)*  Patient Details Name: Victoria Simmons MRN: [id number] DOB: 1996/06/05 Today's Date: 05/16/2017  Patient Stressors: Family, Work (Not in the best place with mom and siblings, but they are working it out; a lot of people have quit at her job and she is having to work extra hours)  Coping Skills:   Isolate, Arguments, Substance Abuse, Avoidance, Exercise, Art/Dance, Talking, Music  Personal Challenges: Anger, Communication, Decision-Making, Expressing Yourself, Problem-Solving, Relationships, Self-Esteem/Confidence, Social Interaction, Stress Management, Trusting Others, Work Midwife (2+):  Individual - Other (Comment) (Hang out with friends, family vacations)  Awareness of Community Resources:  Yes  Community Resources:  [place], Other (Comment) Environmental health practitioner)  Current Use: No  If no, Barriers?: Other (Comment) (Too busy at work)  Patient Strengths:  Nice person, makes others laugh  Patient Identified Areas of Improvement:  How she expresses her anger  Current Recreation Participation:  Hanging out with friends, swimming, shopping  Patient Goal for Hospitalization:  Learn how to express feelings more and be more comfortable talking  [place] of Residence:  [place] of Residence:  [place]   Current SI (including self-harm):  No  Current HI:  No  Consent to Intern Participation: N/A   Leonette Monarch, LRT/CTRS 05/16/2017, 8:40 AM

## 2017-05-16 NOTE — Progress Notes (Signed)
D: Pt denies SI/HI/AVH. Patient's affect is bright, mood is pleasant and she is cooperative with treatment plan. Patient's thoughts are organized.  Pt appears less anxious and she is interacting with peers and staff appropriately.  A: Pt was offered support and encouragement. Pt was given her  scheduled medications. Pt was encouraged to attend groups. Q 15 minute checks were done for safety.  R:Pt attends groups and interacts appropriately with peers and staff. Pt is complaint with medication, no distress noted. Pt  Is receptive to treatment and safety maintained on unit.

## 2017-05-16 NOTE — Progress Notes (Signed)
Recreation Therapy Notes  INPATIENT RECREATION TR PLAN  Patient Details Name: Victoria Simmons MRN: [id number] DOB: 1996/03/09 Today's Date: 05/16/2017  Rec Therapy Plan Is patient appropriate for Therapeutic Recreation?: Yes Treatment times per week: At least once a week TR Treatment/Interventions: 1:1 session, Group participation (Comment) (Appropriate participation in daily recreational therapy tx)  Discharge Criteria Pt will be discharged from therapy if:: Treatment goals are met, Discharged Treatment plan/goals/alternatives discussed and agreed upon by:: Patient/family  Discharge Summary Short term goals set: See Care Plan Short term goals met: Complete Progress toward goals comments: One-to-one attended Which groups?: Self-esteem One-to-one attended: Anger management, stress management Reason goals not met: N/A Therapeutic equipment acquired: None Reason patient discharged from therapy: Discharge from hospital Pt/family agrees with progress & goals achieved: Yes Date patient discharged from therapy: 05/16/17   Leonette Monarch, LRT/CTRS 05/16/2017, 2:09 PM

## 2017-05-17 MED FILL — FLUVOXAMINE MALEATE 100 MG: 100 | 30 days supply | Qty: 30 | Fill #0

## 2017-05-17 MED FILL — lamoTRIgine 25 MG TABS: 25 | 6 days supply | Qty: 45 | Fill #0

## 2017-05-20 MED FILL — traZODone HCL 100 MG TABS: 100 | 30 days supply | Qty: 30 | Fill #0

## 2017-05-22 ENCOUNTER — Encounter: Payer: Self-pay | Admitting: Psychiatry

## 2017-05-22 ENCOUNTER — Ambulatory Visit (INDEPENDENT_AMBULATORY_CARE_PROVIDER_SITE_OTHER): Payer: 59 | Admitting: Psychiatry

## 2017-05-22 VITALS — BP 99/64 | HR 60 | Temp 98.0°F | Wt 134.0 lb

## 2017-05-22 DIAGNOSIS — F331 Major depressive disorder, recurrent, moderate: Secondary | ICD-10-CM

## 2017-05-22 DIAGNOSIS — F411 Generalized anxiety disorder: Secondary | ICD-10-CM | POA: Diagnosis not present

## 2017-05-22 NOTE — Progress Notes (Signed)
Psychiatric progress note  Patient Identification: Victoria Simmons MRN:  [id number] Date of Evaluation:  05/22/2017 Referral Source: Dr.Aaron Chief Complaint:  Anxiety slightly better  Chief Complaint    Follow-up; Medication Refill     Visit Diagnosis:    ICD-10-CM   1. Moderate episode of recurrent major depressive disorder (HCC) F33.1   2. GAD (generalized anxiety disorder) F41.1     History of Present Illness: Patient was seen today for follow-up of depression and anxiety. Patient was hospitalized last week when she expressed increased anxiety and suicidal thoughts. She reports that being in the hospital helped her a lot and her medications were changed to Luvox 100 mg daily and she is currently on a titration schedule for the Lamictal. She is currently taking 25 mg of Lamictal. She denies any side effects and reports that she's been doing quite well on it. She will be titrating up on the Lamictal over the next few weeks. States that she is sleeping well and eating well. States that being in the hospital helped her to attend the groups and learn great deal and the improve her coping skills. She currently denies any suicidal thoughts and reports doing quite well.     Past Psychiatric History: Saw a psychologist twice earlier in the year.  Previous Psychotropic Medications: Yes , zoloft- stopped working at 50mg  , tried Prozac couple years ago but did not help.   Substance Abuse History in the last 12 months:  No.  Consequences of Substance Abuse: Negative  Past Medical History:  Past Medical History:  Diagnosis Date  . Ovarian torsion 2008    Past Surgical History:  Procedure Laterality Date  . OVARY SURGERY      Family Psychiatric History: Mother has anxiety  Family History:  Family History  Problem Relation Age of Onset  . Hepatitis B Mother   . Anxiety disorder Mother   . Hypertension Father   . ADD / ADHD Brother     Social History:   Social History    Social History  . Marital status: Single    Spouse name: N/A  . Number of children: N/A  . Years of education: N/A   Social History Main Topics  . Smoking status: Never Smoker  . Smokeless tobacco: Never Used  . Alcohol use No  . Drug use: No  . Sexual activity: No   Other Topics Concern  . None   Social History Narrative   ** Merged History Encounter **        Additional Social History: Lives with biological parents and 107 yo brother and 65 yo sister.  Allergies:  No Known Allergies  Metabolic Disorder Labs: Lab Results  Component Value Date   HGBA1C 4.9 05/14/2017   MPG 94 05/14/2017   No results found for: PROLACTIN Lab Results  Component Value Date   CHOL 254 (H) 05/14/2017   TRIG 151 (H) 05/14/2017   HDL 61 05/14/2017   CHOLHDL 4.2 05/14/2017   VLDL 30 05/14/2017   LDLCALC 163 (H) 05/14/2017     Current Medications: Current Outpatient Prescriptions  Medication Sig Dispense Refill  . fluvoxaMINE (LUVOX) 100 MG tablet Take 1 tablet (100 mg total) by mouth at bedtime. 30 tablet 1  . lamoTRIgine (LAMICTAL) 25 MG tablet Take 1 tablet (25 mg total) by mouth at bedtime. Take 1 pill for 2 weeks, 2 pills for 2 weeks, 4 pills for 2 weeks, 8 pills or 200 mg after. 45 tablet 1  . [place]  28 0.25-35 MG-MCG tablet TAKE 1 TABLET BY MOUTH EVERY DAY 28 tablet 0  . traZODone (DESYREL) 100 MG tablet Take 1 tablet (100 mg total) by mouth at bedtime. 30 tablet 1   No current facility-administered medications for this visit.     Neurologic: Headache: No Seizure: No Paresthesias:No  Musculoskeletal: Strength & Muscle Tone: within normal limits Gait & Station: normal Patient leans: N/A  Psychiatric Specialty Exam: ROS  Blood pressure 99/64, pulse 60, temperature 98 F (36.7 C), temperature source Oral, weight 134 lb (60.8 kg), last menstrual period 05/07/2017.Body mass index is 22.3 kg/m.  General Appearance: Casual  Eye Contact:  Fair  Speech:  Clear and  Coherent  Volume:  Normal  Mood:  Improved   Affect:  Pleasant   Thought Process:  Coherent  Orientation:  Full (Time, Place, and Person)  Thought Content:  WDL and Logical  Suicidal Thoughts: None this past week   Homicidal Thoughts:  No  Memory:  Immediate;   Fair Recent;   Fair Remote;   Fair  Judgement:  Fair  Insight:  Fair  Psychomotor Activity:  Normal  Concentration:  Concentration: Fair and Attention Span: Fair  Recall:  [employer] of Knowledge:Fair  Language: Fair  Akathisia:  No  Handed:  Right  AIMS (if indicated):  na  Assets:  Communication Skills Desire for Improvement Financial Resources/Insurance Physical Health Social Support Vocational/Educational  ADL's:  Intact  Cognition: WNL  Sleep:  good    Treatment Plan Summary:  Generalized anxiety disorder  Discontinue Zoloft. Continue Luvox at 100 mg once daily as started in the hospital Continue therapy with Royal Piedra  Bipolar 2 disorder Continue Lamictal at 25 mg once daily and titrated up to 200 mg over several weeks as instructed.  Return to clinic in 10 days time or call before if needed.   Elvin So, MD 8/15/20189:50 AM

## 2017-05-23 ENCOUNTER — Other Ambulatory Visit: Payer: Self-pay | Admitting: Family Medicine

## 2017-05-28 ENCOUNTER — Ambulatory Visit (INDEPENDENT_AMBULATORY_CARE_PROVIDER_SITE_OTHER): Payer: 59 | Admitting: Licensed Clinical Social Worker

## 2017-05-28 DIAGNOSIS — F411 Generalized anxiety disorder: Secondary | ICD-10-CM

## 2017-06-03 ENCOUNTER — Ambulatory Visit (INDEPENDENT_AMBULATORY_CARE_PROVIDER_SITE_OTHER): Payer: 59 | Admitting: Psychiatry

## 2017-06-03 ENCOUNTER — Encounter: Payer: Self-pay | Admitting: Psychiatry

## 2017-06-03 VITALS — BP 104/72 | HR 83 | Temp 98.0°F | Wt 136.0 lb

## 2017-06-03 DIAGNOSIS — F331 Major depressive disorder, recurrent, moderate: Secondary | ICD-10-CM | POA: Diagnosis not present

## 2017-06-03 DIAGNOSIS — F411 Generalized anxiety disorder: Secondary | ICD-10-CM

## 2017-06-03 MED ORDER — LAMOTRIGINE 100 MG PO TABS: ORAL_TABLET | ORAL | 1 refills | Status: DC

## 2017-06-03 NOTE — Progress Notes (Signed)
Psychiatric progress note  Patient Identification: Victoria Simmons MRN:  [id number] Date of Evaluation:  06/03/2017 Referral Source: Dr.Aaron Chief Complaint:  Doing well  Chief Complaint    Follow-up; Medication Refill     Visit Diagnosis:    ICD-10-CM   1. GAD (generalized anxiety disorder) F41.1   2. Moderate episode of recurrent major depressive disorder (HCC) F33.1     History of Present Illness: Patient was seen today for follow-up of depression and anxiety. She reports that she has been doing well and has been compliant with her medications. She is currently taking 50 mg of Lamictal daily and next Tuesday we will start the 100 mg. Reports that the will be helpful to take a larger tablet than taking so many smaller tablets. 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 States that she is sleeping well and eating well.She currently denies any suicidal thoughts and reports doing quite well.  Past Psychiatric History: Saw a psychologist twice earlier in the year.  Previous Psychotropic Medications: Yes , zoloft- stopped working at 50mg  , tried Prozac couple years ago but did not help.   Substance Abuse History in the last 12 months:  No.  Consequences of Substance Abuse: Negative  Past Medical History:  Past Medical History:  Diagnosis Date  . Ovarian torsion 2008    Past Surgical History:  Procedure Laterality Date  . OVARY SURGERY      Family Psychiatric History: Mother has anxiety  Family History:  Family History  Problem Relation Age of Onset  . Hepatitis B Mother   . Anxiety disorder Mother   . Hypertension Father   . ADD / ADHD Brother     Social History:   Social History   Social History  . Marital status: Single    Spouse name: N/A  . Number of children: N/A  . Years of education: N/A   Social History Main Topics  . Smoking status: Never Smoker  . Smokeless tobacco: Never Used  . Alcohol use No  . Drug use: No  . Sexual activity: No    Other Topics Concern  . None   Social History Narrative   ** Merged History Encounter **        Additional Social History: Lives with biological parents and 6 yo brother and 54 yo sister.  Allergies:  No Known Allergies  Metabolic Disorder Labs: Lab Results  Component Value Date   HGBA1C 4.9 05/14/2017   MPG 94 05/14/2017   No results found for: PROLACTIN Lab Results  Component Value Date   CHOL 254 (H) 05/14/2017   TRIG 151 (H) 05/14/2017   HDL 61 05/14/2017   CHOLHDL 4.2 05/14/2017   VLDL 30 05/14/2017   LDLCALC 163 (H) 05/14/2017     Current Medications: Current Outpatient Prescriptions  Medication Sig Dispense Refill  . fluvoxaMINE (LUVOX) 100 MG tablet Take 1 tablet (100 mg total) by mouth at bedtime. 30 tablet 1  . lamoTRIgine (LAMICTAL) 100 MG tablet Take 1 tablet by mouth starting next Tuesday and after 2 week increase to 2 tablets once daily 60 tablet 1  . SPRINTEC 28 0.25-35 MG-MCG tablet TAKE 1 TABLET BY MOUTH EVERY DAY 28 tablet 2  . traZODone (DESYREL) 100 MG tablet Take 1 tablet (100 mg total) by mouth at bedtime. 30 tablet 1   No current facility-administered medications for this visit.     Neurologic: Headache: No Seizure: No Paresthesias:No  Musculoskeletal: Strength & Muscle Tone: within normal  limits Gait & Station: normal Patient leans: N/A  Psychiatric Specialty Exam: ROS  Blood pressure 104/72, pulse 83, temperature 98 F (36.7 C), temperature source Oral, weight 136 lb (61.7 kg), last menstrual period 06/03/2017.Body mass index is 22.63 kg/m.  General Appearance: Casual  Eye Contact:  Fair  Speech:  Clear and Coherent  Volume:  Normal  Mood:  Improved   Affect:  Pleasant   Thought Process:  Coherent  Orientation:  Full (Time, Place, and Person)  Thought Content:  WDL and Logical  Suicidal Thoughts: None   Homicidal Thoughts:  No  Memory:  Immediate;   Fair Recent;   Fair Remote;   Fair  Judgement:  Fair  Insight:  Fair   Psychomotor Activity:  Normal  Concentration:  Concentration: Fair and Attention Span: Fair  Recall:  [employer] of Knowledge:Fair  Language: Fair  Akathisia:  No  Handed:  Right  AIMS (if indicated):  na  Assets:  Communication Skills Desire for Improvement Financial Resources/Insurance Physical Health Social Support Vocational/Educational  ADL's:  Intact  Cognition: WNL  Sleep:  good    Treatment Plan Summary:  Generalized anxiety disorder Continue Luvox at 100 mg once daily as started in the hospital Continue therapy with Royal Piedra  Bipolar 2 disorder Continue Lamictal at 50 mg once daily and titrated up to 200 mg over several weeks as instructed.  Return to clinic in 4 weeks time or call before if needed.   Elvin So, MD 8/27/20182:44 PM

## 2017-06-12 NOTE — Progress Notes (Signed)
   THERAPIST PROGRESS NOTE  Session Time: 87min  Participation Level: Active  Behavioral Response: CasualAlertAnxious  Type of Therapy: Individual Therapy  Treatment Goals addressed: Coping and Diagnosis: Anxiety  Interventions: CBT, Motivational Interviewing, Solution Focused, Strength-based and Supportive  Summary: Victoria Simmons is a 21 y.o. female who presents with continued symptoms of her diagnosis. Patient reports that she is "100 times better."   Patient was able to describe her stay in inpatient.  Patient reports that she learned new coping skills to use when she feels overwhelmed.  She reports that her parent never believed her until her recent hospital visit.  Patient reports that she is working and is able to understand her moods better.  Suicidal/Homicidal: No  Therapist Response: LCSW provided Patient with ongoing emotional support and encouragement.      Plan: Return again upon discharge from behavioral health unit.  Diagnosis: Axis I: Generalized Anxiety Disorder and Depression    Axis II: No diagnosis    Lubertha South, LCSW 05/28/2017

## 2017-06-14 MED FILL — FLUVOXAMINE MALEATE 100 MG: 100 | 30 days supply | Qty: 30 | Fill #1

## 2017-06-14 MED FILL — lamoTRIgine 25 MG TABS: 25 | 6 days supply | Qty: 45 | Fill #1

## 2017-06-17 ENCOUNTER — Ambulatory Visit (INDEPENDENT_AMBULATORY_CARE_PROVIDER_SITE_OTHER): Payer: 59 | Admitting: Licensed Clinical Social Worker

## 2017-06-17 DIAGNOSIS — F411 Generalized anxiety disorder: Secondary | ICD-10-CM

## 2017-06-17 DIAGNOSIS — F331 Major depressive disorder, recurrent, moderate: Secondary | ICD-10-CM | POA: Diagnosis not present

## 2017-06-17 NOTE — Progress Notes (Signed)
   THERAPIST PROGRESS NOTE  Session Time: 53min  Participation Level: Active  Behavioral Response: CasualAlertAnxious  Type of Therapy: Individual Therapy  Treatment Goals addressed: Coping and Diagnosis: Anxiety  Interventions: CBT, Motivational Interviewing, Solution Focused, Strength-based and Supportive  Summary: Victoria Simmons is a 21 y.o. female who presents with continued symptoms of her diagnosis. Patient continues to reports improvement with her mood.  She states that she is reducing all negative interactions from her life.  She quit working at [employer] due to continued stress and anxiety.  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  She reports that school is going well.  SHe only has one class this semester.  She reports a favorable social life.  She reports that she and a friend will go to visit another friend in [place].  SHe reports being able to have a good support network has helped with reducing symptoms.  Suicidal/Homicidal: No  Therapist Response: LCSW provided Patient with ongoing emotional support and encouragement.  Normalized her feelings.  Commended Patient on her progress and reinforced the importance of client staying focused on her own strengths and resources and resiliency. Processed various strategies for dealing with stressors.    Plan: 2 weeks  Diagnosis: Axis I: Generalized Anxiety Disorder and Depression    Axis II: No diagnosis    Victoria South, LCSW 06/17/2017

## 2017-06-19 ENCOUNTER — Encounter: Payer: Self-pay | Admitting: Family Medicine

## 2017-06-19 ENCOUNTER — Ambulatory Visit (INDEPENDENT_AMBULATORY_CARE_PROVIDER_SITE_OTHER): Payer: 59 | Admitting: Family Medicine

## 2017-06-19 VITALS — BP 90/60 | HR 78 | Temp 98.5°F | Resp 16 | Ht 65.0 in | Wt 133.8 lb

## 2017-06-19 DIAGNOSIS — F329 Major depressive disorder, single episode, unspecified: Secondary | ICD-10-CM | POA: Insufficient documentation

## 2017-06-19 DIAGNOSIS — E785 Hyperlipidemia, unspecified: Secondary | ICD-10-CM

## 2017-06-19 DIAGNOSIS — Z23 Encounter for immunization: Secondary | ICD-10-CM | POA: Diagnosis not present

## 2017-06-19 DIAGNOSIS — N921 Excessive and frequent menstruation with irregular cycle: Secondary | ICD-10-CM | POA: Diagnosis not present

## 2017-06-19 DIAGNOSIS — Z01419 Encounter for gynecological examination (general) (routine) without abnormal findings: Secondary | ICD-10-CM

## 2017-06-19 DIAGNOSIS — F411 Generalized anxiety disorder: Secondary | ICD-10-CM

## 2017-06-19 DIAGNOSIS — A159 Respiratory tuberculosis unspecified: Secondary | ICD-10-CM | POA: Diagnosis not present

## 2017-06-19 DIAGNOSIS — F3342 Major depressive disorder, recurrent, in full remission: Secondary | ICD-10-CM | POA: Diagnosis not present

## 2017-06-19 DIAGNOSIS — Z0001 Encounter for general adult medical examination with abnormal findings: Secondary | ICD-10-CM | POA: Insufficient documentation

## 2017-06-19 HISTORY — DX: Major depressive disorder, single episode, unspecified: F32.9

## 2017-06-19 NOTE — Progress Notes (Signed)
Subjective:   Patient ID: Victoria Simmons, female    DOB: [birth year], 21 y.o.   MRN: [id number]  Victoria Simmons is a pleasant 21 y.o. year old female who presents to clinic today with Annual Exam  on 06/19/2017  HPI:  GAD, MDD- followed by psychiatry, Dr. Einar Grad. Was last seen on 06/03/17. Note reviewed.  She was admitted to behavioral health earlier last month.  And the OV with Dr. Einar Grad was a hospital follow up. She was advised to continue current dose of Luvox and to titrate up lamictal over several weeks, along with continued psychotherapy.  She states she is "feeling much better."  Pleased with current OCPs.    HLD- labs were done while she was in the hospital, LDL elevated. Does have family h/o HLD.  Lab Results  Component Value Date   CHOL 254 (H) 05/14/2017   HDL 61 05/14/2017   LDLCALC 163 (H) 05/14/2017   TRIG 151 (H) 05/14/2017   CHOLHDL 4.2 05/14/2017   Lab Results  Component Value Date   NA 140 05/14/2017   K 4.3 05/14/2017   CL 109 05/14/2017   CO2 25 05/14/2017   Lab Results  Component Value Date   ALT 18 05/14/2017   AST 16 05/14/2017   ALKPHOS 53 05/14/2017   BILITOT 0.9 05/14/2017    Current Outpatient Prescriptions on File Prior to Visit  Medication Sig Dispense Refill  . fluvoxaMINE (LUVOX) 100 MG tablet Take 1 tablet (100 mg total) by mouth at bedtime. 30 tablet 1  . lamoTRIgine (LAMICTAL) 100 MG tablet Take 1 tablet by mouth starting next Tuesday and after 2 week increase to 2 tablets once daily 60 tablet 1  . SPRINTEC 28 0.25-35 MG-MCG tablet TAKE 1 TABLET BY MOUTH EVERY DAY 28 tablet 2  . traZODone (DESYREL) 100 MG tablet Take 1 tablet (100 mg total) by mouth at bedtime. 30 tablet 1   No current facility-administered medications on file prior to visit.     No Known Allergies  Past Medical History:  Diagnosis Date  . Ovarian torsion 2008    Past Surgical History:  Procedure Laterality Date  . OVARY SURGERY      Family History    Problem Relation Age of Onset  . Hepatitis B Mother   . Anxiety disorder Mother   . Hypertension Father   . ADD / ADHD Brother     Social History   Social History  . Marital status: Single    Spouse name: N/A  . Number of children: N/A  . Years of education: N/A   Occupational History  . Not on file.   Social History Main Topics  . Smoking status: Never Smoker  . Smokeless tobacco: Never Used  . Alcohol use No  . Drug use: No  . Sexual activity: No   Other Topics Concern  . Not on file   Social History Narrative   ** Merged History Encounter **       The PMH, PSH, Social History, Family History, Medications, and allergies have been reviewed in [facility], and have been updated if relevant.   Review of Systems  Constitutional: Negative.   HENT: Negative.   Eyes: Negative.   Respiratory: Negative.   Cardiovascular: Negative.   Gastrointestinal: Negative.   Endocrine: Negative.   Genitourinary: Negative.   Musculoskeletal: Negative.   Skin: Negative.   Allergic/Immunologic: Negative.   Neurological: Negative.   Hematological: Negative.   Psychiatric/Behavioral: Negative.   All other systems  reviewed and are negative.      Objective:    BP 90/60   Pulse 78   Temp 98.5 F (36.9 C) (Oral)   Resp 16   Ht 5\' 5"  (1.651 m)   Wt 133 lb 12.8 oz (60.7 kg)   LMP 06/03/2017   SpO2 98%   BMI 22.27 kg/m    Physical Exam   General:  Well-developed,well-nourished,in no acute distress; alert,appropriate and cooperative throughout examination Head:  normocephalic and atraumatic.   Eyes:  vision grossly intact, PERRL Ears:  R ear normal and L ear normal externally, TMs clear bilaterally Nose:  no external deformity.   Mouth:  good dentition.   Neck:  No deformities, masses, or tenderness noted. Lungs:  Normal respiratory effort, chest expands symmetrically. Lungs are clear to auscultation, no crackles or wheezes. Heart:  Normal rate and regular rhythm. S1 and S2  normal without gallop, murmur, click, rub or other extra sounds. Abdomen:  Bowel sounds positive,abdomen soft and non-tender without masses, organomegaly or hernias noted. Msk:  No deformity or scoliosis noted of thoracic or lumbar spine.   Extremities:  No clubbing, cyanosis, edema, or deformity noted with normal full range of motion of all joints.   Neurologic:  alert & oriented X3 and gait normal.   Skin:  Intact without suspicious lesions or rashes Cervical Nodes:  No lymphadenopathy noted Axillary Nodes:  No palpable lymphadenopathy Psych:  Cognition and judgment appear intact. Alert and cooperative with normal attention span and concentration. No apparent delusions, illusions, hallucinations       Assessment & Plan:   Generalized anxiety disorder  Well woman exam  Recurrent major depressive disorder, in full remission ([place])  Hyperlipidemia, unspecified hyperlipidemia type No Follow-up on file.

## 2017-06-19 NOTE — Assessment & Plan Note (Signed)
Resolved with OCPs. She is pleased with this OCP.

## 2017-06-19 NOTE — Assessment & Plan Note (Signed)
Reviewed preventive care protocols, scheduled due services, and updated immunizations Discussed nutrition, exercise, diet, and healthy lifestyle.  

## 2017-06-19 NOTE — Assessment & Plan Note (Signed)
Followed by psych

## 2017-06-19 NOTE — Assessment & Plan Note (Signed)
New- she would like to work on diet. Handout given.

## 2017-06-26 ENCOUNTER — Ambulatory Visit (INDEPENDENT_AMBULATORY_CARE_PROVIDER_SITE_OTHER): Payer: 59

## 2017-06-26 DIAGNOSIS — Z111 Encounter for screening for respiratory tuberculosis: Secondary | ICD-10-CM

## 2017-06-28 LAB — TB SKIN TEST
Induration: 0 mm
TB Skin Test: NEGATIVE

## 2017-07-01 MED FILL — traZODone HCL 100 MG TABS: 100 | 30 days supply | Qty: 30 | Fill #1

## 2017-07-08 ENCOUNTER — Encounter: Payer: Self-pay | Admitting: Family Medicine

## 2017-07-08 ENCOUNTER — Ambulatory Visit (INDEPENDENT_AMBULATORY_CARE_PROVIDER_SITE_OTHER): Payer: 59 | Admitting: Family Medicine

## 2017-07-08 VITALS — BP 96/60 | HR 83 | Temp 98.3°F | Wt 130.5 lb

## 2017-07-08 DIAGNOSIS — B9789 Other viral agents as the cause of diseases classified elsewhere: Secondary | ICD-10-CM | POA: Diagnosis not present

## 2017-07-08 DIAGNOSIS — J069 Acute upper respiratory infection, unspecified: Secondary | ICD-10-CM

## 2017-07-08 NOTE — Progress Notes (Signed)
   Subjective:    Patient ID: Victoria Simmons, female    DOB: [birth year], 21 y.o.   MRN: [id number]  HPI This is a 21 yo female, accompanied by her mother, who presents today with body aches, fever, nasal congestion x 3 days. Started with sore throat. Temperature to 100. Nose stopped up and nasal/head congestion. Mild dry cough, no SOB or wheeze, no history of asthma. Has taken dayquil and nyquil yesterday without relief. 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    Past Medical History:  Diagnosis Date  . Ovarian torsion 2008   Past Surgical History:  Procedure Laterality Date  . OVARY SURGERY     Family History  Problem Relation Age of Onset  . Hepatitis B Mother   . Anxiety disorder Mother   . Hypertension Father   . ADD / ADHD Brother    Social History  Substance Use Topics  . Smoking status: Never Smoker  . Smokeless tobacco: Never Used  . Alcohol use No      Review of Systems Per HPI    Objective:   Physical Exam  Constitutional: She is oriented to person, place, and time. She appears well-developed and well-nourished. No distress.  Appears mildly ill.   HENT:  Head: Normocephalic and atraumatic.  Right Ear: Tympanic membrane, external ear and ear canal normal.  Left Ear: Tympanic membrane, external ear and ear canal normal.  Nose: Mucosal edema and rhinorrhea present.  Mouth/Throat: Uvula is midline and oropharynx is clear and moist.  Cardiovascular: Normal rate, regular rhythm and normal heart sounds.   Pulmonary/Chest: Effort normal and breath sounds normal.  Neurological: She is alert and oriented to person, place, and time.  Skin: Skin is warm and dry. She is not diaphoretic.  Psychiatric: She has a normal mood and affect. Her behavior is normal. Judgment and thought content normal.  Vitals reviewed.     BP 96/60 (BP Location: Left Arm, Patient Position: Sitting, Cuff Size: Normal)   Pulse 83   Temp 98.3 F (36.8 C) (Oral)   Wt 130 lb 8 oz (59.2 kg)    LMP 07/03/2017   SpO2 97%   BMI 21.72 kg/m  Wt Readings from Last 3 Encounters:  07/08/17 130 lb 8 oz (59.2 kg)  06/19/17 133 lb 12.8 oz (60.7 kg)  09/14/16 139 lb (63 kg)       Assessment & Plan:  1. Viral URI with cough -Provided written and verbal information regarding diagnosis and treatment. -  Patient Instructions  For nasal congestion you can use Afrin nasal spray for 3 days max, Sudafed, saline nasal spray (generic is fine for all). For cough you can try Delsym. Drink enough fluids to make your urine light yellow. For fever/chill/muscle aches you can take over the counter acetaminophen or ibuprofen.  Please come back in if you are not better in 5-7 days or if you develop wheezing, shortness of breath or persistent vomiting.  Can try Naprosyn 1-2 tablets every 12 hours for muscle aches       Clarene Reamer, FNP-BC  [facility], [place]  07/08/2017 2:53 PM

## 2017-07-08 NOTE — Patient Instructions (Signed)
For nasal congestion you can use Afrin nasal spray for 3 days max, Sudafed, saline nasal spray (generic is fine for all). For cough you can try Delsym. Drink enough fluids to make your urine light yellow. For fever/chill/muscle aches you can take over the counter acetaminophen or ibuprofen.  Please come back in if you are not better in 5-7 days or if you develop wheezing, shortness of breath or persistent vomiting.  Can try Naprosyn 1-2 tablets every 12 hours for muscle aches

## 2017-07-10 ENCOUNTER — Encounter: Payer: Self-pay | Admitting: Psychiatry

## 2017-07-10 ENCOUNTER — Ambulatory Visit (INDEPENDENT_AMBULATORY_CARE_PROVIDER_SITE_OTHER): Payer: 59 | Admitting: Licensed Clinical Social Worker

## 2017-07-10 ENCOUNTER — Ambulatory Visit (INDEPENDENT_AMBULATORY_CARE_PROVIDER_SITE_OTHER): Payer: 59 | Admitting: Psychiatry

## 2017-07-10 VITALS — BP 106/71 | HR 67 | Temp 98.7°F | Wt 132.0 lb

## 2017-07-10 DIAGNOSIS — F331 Major depressive disorder, recurrent, moderate: Secondary | ICD-10-CM | POA: Diagnosis not present

## 2017-07-10 DIAGNOSIS — F411 Generalized anxiety disorder: Secondary | ICD-10-CM | POA: Diagnosis not present

## 2017-07-10 MED ORDER — FLUVOXAMINE MALEATE 100 MG PO TABS: 100.0000 mg | ORAL_TABLET | Freq: Every day | ORAL | 1 refills | Status: DC

## 2017-07-10 MED ORDER — LAMOTRIGINE 25 MG PO TABS: ORAL_TABLET | ORAL | 1 refills | Status: DC

## 2017-07-10 MED FILL — FLUVOXAMINE MALEATE 100 MG: 100 | 30 days supply | Qty: 30 | Fill #0

## 2017-07-10 MED FILL — lamoTRIgine 25 MG TABS: 25 | 23 days supply | Qty: 90 | Fill #0

## 2017-07-10 NOTE — Progress Notes (Signed)
Psychiatric progress note  Patient Identification: Victoria Simmons MRN:  [id number] Date of Evaluation:  07/10/2017 Referral Source: Dr.Aaron Chief Complaint:  Doing well   Visit Diagnosis:    ICD-10-CM   1. Moderate episode of recurrent major depressive disorder (HCC) F33.1   2. GAD (generalized anxiety disorder) F41.1     History of Present Illness: Patient was seen today for follow-up of depression and anxiety. She reports that she has been doing quite well. 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 States that she does not feel stressed at all. However she has not been taking the Lamictal for the past 2 weeks and she ran out. States that her pharmacy was trying to get in touch here. Told patient that we did not receive any request from the pharmacy. She has not noticed any increase reemergence of her mood symptoms. Sleeping well eating well. Denies any suicidal thoughts. Past Psychiatric History: Saw a psychologist twice earlier in the year.  Previous Psychotropic Medications: Yes , zoloft- stopped working at 50mg  , tried Prozac couple years ago but did not help.   Substance Abuse History in the last 12 months:  No.  Consequences of Substance Abuse: Negative  Past Medical History:  Past Medical History:  Diagnosis Date  . Ovarian torsion 2008    Past Surgical History:  Procedure Laterality Date  . OVARY SURGERY      Family Psychiatric History: Mother has anxiety  Family History:  Family History  Problem Relation Age of Onset  . Hepatitis B Mother   . Anxiety disorder Mother   . Hypertension Father   . ADD / ADHD Brother     Social History:   Social History   Social History  . Marital status: Single    Spouse name: N/A  . Number of children: N/A  . Years of education: N/A   Social History Main Topics  . Smoking status: Never Smoker  . Smokeless tobacco: Never Used  . Alcohol use No  . Drug use: No  . Sexual activity: No    Other Topics Concern  . Not on file   Social History Narrative   ** Merged History Encounter **        Additional Social History: Lives with biological parents and 96 yo brother and 5 yo sister.  Allergies:  No Known Allergies  Metabolic Disorder Labs: Lab Results  Component Value Date   HGBA1C 4.9 05/14/2017   MPG 94 05/14/2017   No results found for: PROLACTIN Lab Results  Component Value Date   CHOL 254 (H) 05/14/2017   TRIG 151 (H) 05/14/2017   HDL 61 05/14/2017   CHOLHDL 4.2 05/14/2017   VLDL 30 05/14/2017   LDLCALC 163 (H) 05/14/2017     Current Medications: Current Outpatient Prescriptions  Medication Sig Dispense Refill  . fluvoxaMINE (LUVOX) 100 MG tablet Take 1 tablet (100 mg total) by mouth at bedtime. 30 tablet 1  . lamoTRIgine (LAMICTAL) 100 MG tablet Take 1 tablet by mouth starting next Tuesday and after 2 week increase to 2 tablets once daily 60 tablet 1  . SPRINTEC 28 0.25-35 MG-MCG tablet TAKE 1 TABLET BY MOUTH EVERY DAY 28 tablet 2  . traZODone (DESYREL) 100 MG tablet Take 1 tablet (100 mg total) by mouth at bedtime. 30 tablet 1   No current facility-administered medications for this visit.     Neurologic: Headache: No Seizure: No Paresthesias:No  Musculoskeletal: Strength & Muscle Tone: within normal  limits Gait & Station: normal Patient leans: N/A  Psychiatric Specialty Exam: ROS  Last menstrual period 07/03/2017.There is no height or weight on file to calculate BMI.  General Appearance: Casual  Eye Contact:  Fair  Speech:  Clear and Coherent  Volume:  Normal  Mood:  Improved   Affect:  Pleasant   Thought Process:  Coherent  Orientation:  Full (Time, Place, and Person)  Thought Content:  WDL and Logical  Suicidal Thoughts: None   Homicidal Thoughts:  No  Memory:  Immediate;   Fair Recent;   Fair Remote;   Fair  Judgement:  Fair  Insight:  Fair  Psychomotor Activity:  Normal  Concentration:  Concentration: Fair and  Attention Span: Fair  Recall:  [employer] of Knowledge:Fair  Language: Fair  Akathisia:  No  Handed:  Right  AIMS (if indicated):  na  Assets:  Communication Skills Desire for Improvement Financial Resources/Insurance Physical Health Social Support Vocational/Educational  ADL's:  Intact  Cognition: WNL  Sleep:  good    Treatment Plan Summary:  Generalized anxiety disorder Continue Luvox at 100 mg once daily as started in the hospital Continue therapy with Royal Piedra  Bipolar 2 disorder Restart Lamictal at 25 mg twice daily for 2 weeks and increase to 50 mg twice daily for 2 weeks.   Return to clinic in 4 weeks time or call before if needed.   Elvin So, MD 10/3/201811:00 AM

## 2017-07-12 NOTE — Progress Notes (Signed)
   THERAPIST PROGRESS NOTE  Session Time: 20min  Participation Level: Active  Behavioral Response: CasualAlertEuthymic  Type of Therapy: Individual Therapy  Treatment Goals addressed: Coping and Diagnosis: Anxiety  Interventions: CBT, Motivational Interviewing, Solution Focused, Strength-based and Supportive  Summary: Victoria Simmons is a 21 y.o. female who presents with continued symptoms of her diagnosis.  Patient reports a "favorable" mood.  Patient reports that she has had an abundance of great things occur in there life.  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  She denies any major concerns or an future stressors.  She reports that school has changed a bit but she is able to manage her new professor and his teaching style.   Suicidal/Homicidal: No  Therapist Response: LCSW provided Patient with ongoing emotional support and encouragement.  Normalized her feelings.  Commended Patient on her progress and reinforced the importance of client staying focused on her own strengths and resources and resiliency. Processed various strategies for dealing with stressors.    Plan: 2 weeks  Diagnosis: Axis I: Generalized Anxiety Disorder and Depression    Axis II: No diagnosis    Lubertha South, LCSW 07/11/2017

## 2017-07-16 ENCOUNTER — Other Ambulatory Visit: Payer: Self-pay | Admitting: Family Medicine

## 2017-08-06 MED FILL — FLUVOXAMINE MALEATE 100 MG: 100 | 30 days supply | Qty: 30 | Fill #1

## 2017-08-06 MED FILL — traZODone HCL 100 MG TABS: 100 | 30 days supply | Qty: 30 | Fill #0

## 2017-08-06 MED FILL — lamoTRIgine 25 MG TABS: 25 | 23 days supply | Qty: 90 | Fill #1

## 2017-08-12 ENCOUNTER — Ambulatory Visit: Payer: 59 | Admitting: Licensed Clinical Social Worker

## 2017-08-12 DIAGNOSIS — F411 Generalized anxiety disorder: Secondary | ICD-10-CM | POA: Diagnosis not present

## 2017-08-12 DIAGNOSIS — F331 Major depressive disorder, recurrent, moderate: Secondary | ICD-10-CM | POA: Diagnosis not present
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
## 2017-08-19 ENCOUNTER — Ambulatory Visit: Payer: 59 | Admitting: Psychiatry

## 2017-08-26 ENCOUNTER — Other Ambulatory Visit: Payer: Self-pay

## 2017-08-26 ENCOUNTER — Encounter: Payer: Self-pay | Admitting: Psychiatry

## 2017-08-26 ENCOUNTER — Ambulatory Visit: Payer: 59 | Admitting: Psychiatry

## 2017-08-26 VITALS — BP 101/66 | HR 62 | Temp 98.5°F | Wt 140.4 lb

## 2017-08-26 DIAGNOSIS — F331 Major depressive disorder, recurrent, moderate: Secondary | ICD-10-CM

## 2017-08-26 DIAGNOSIS — F411 Generalized anxiety disorder: Secondary | ICD-10-CM

## 2017-08-26 MED ORDER — FLUVOXAMINE MALEATE 100 MG PO TABS: 100.0000 mg | ORAL_TABLET | Freq: Every day | ORAL | 2 refills | Status: DC

## 2017-08-26 MED ORDER — LAMOTRIGINE 100 MG PO TABS: ORAL_TABLET | ORAL | 2 refills | Status: DC

## 2017-08-26 MED FILL — lamoTRIgine 100 MG TABS: 100 | 30 days supply | Qty: 30 | Fill #0

## 2017-08-26 NOTE — Progress Notes (Signed)
Psychiatric progress note  Patient Identification: Victoria Simmons MRN:  [id number] Date of Evaluation:  08/26/2017 Referral Source: Dr.Aaron Chief Complaint:  Doing very well.  Chief Complaint    Follow-up; Medication Refill     Visit Diagnosis:    ICD-10-CM   1. Moderate episode of recurrent major depressive disorder (HCC) F33.1   2. GAD (generalized anxiety disorder) F41.1     History of Present Illness: Patient was seen today for follow-up of depression and anxiety. She reports that she has been doing quite well. 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 Doing very well moodwise. She has started driving quite a bit and enjoying being independent. She feels that the Lamictal at 100 mg daily has been quite beneficial. We discussed that we will continue her at this dose. Denies any other side effects. Denies any suicidal thoughts.    Past Psychiatric History: Saw a psychologist twice earlier in the year.  Previous Psychotropic Medications: Yes , zoloft- stopped working at 50mg  , tried Prozac couple years ago but did not help.   Substance Abuse History in the last 12 months:  No.  Consequences of Substance Abuse: Negative  Past Medical History:  Past Medical History:  Diagnosis Date  . Ovarian torsion 2008    Past Surgical History:  Procedure Laterality Date  . OVARY SURGERY      Family Psychiatric History: Mother has anxiety  Family History:  Family History  Problem Relation Age of Onset  . Hepatitis B Mother   . Anxiety disorder Mother   . Hypertension Father   . ADD / ADHD Brother     Social History:   Social History   Socioeconomic History  . Marital status: Single    Spouse name: None  . Number of children: 0  . Years of education: None  . Highest education level: Some college, no degree  Social Needs  . Financial resource strain: Not hard at all  . Food insecurity - worry: Never true  . Food insecurity - inability: Never true  . Transportation  needs - medical: No  . Transportation needs - non-medical: No  Occupational History  . Occupation: [employer]    Comment: full time  Tobacco Use  . Smoking status: Never Smoker  . Smokeless tobacco: Never Used  Substance and Sexual Activity  . Alcohol use: No  . Drug use: No  . Sexual activity: No  Other Topics Concern  . None  Social History Narrative   ** Merged History Encounter **        Additional Social History: Lives with biological parents and 38 yo brother and 25 yo sister.  Allergies:  No Known Allergies  Metabolic Disorder Labs: Lab Results  Component Value Date   HGBA1C 4.9 05/14/2017   MPG 94 05/14/2017   No results found for: PROLACTIN Lab Results  Component Value Date   CHOL 254 (H) 05/14/2017   TRIG 151 (H) 05/14/2017   HDL 61 05/14/2017   CHOLHDL 4.2 05/14/2017   VLDL 30 05/14/2017   LDLCALC 163 (H) 05/14/2017     Current Medications: Current Outpatient Medications  Medication Sig Dispense Refill  . fluvoxaMINE (LUVOX) 100 MG tablet Take 1 tablet (100 mg total) by mouth at bedtime. 30 tablet 1  . lamoTRIgine (LAMICTAL) 25 MG tablet Take 1 tablet by mouth twice daily for 2 weeks and then increase to 2 tablets by mouth 2 times daily 90 tablet 1  . SPRINTEC 28 0.25-35 MG-MCG tablet  TAKE 1 TABLET BY MOUTH EVERY DAY 28 tablet 11  . traZODone (DESYREL) 100 MG tablet Take 1 tablet (100 mg total) by mouth at bedtime. 30 tablet 1   No current facility-administered medications for this visit.     Neurologic: Headache: No Seizure: No Paresthesias:No  Musculoskeletal: Strength & Muscle Tone: within normal limits Gait & Station: normal Patient leans: N/A  Psychiatric Specialty Exam: ROS  Blood pressure 101/66, pulse 62, temperature 98.5 F (36.9 C), temperature source Oral, weight 140 lb 6.4 oz (63.7 kg).Body mass index is 23.36 kg/m.  General Appearance: Casual  Eye Contact:  Fair  Speech:  Clear and Coherent  Volume:  Normal  Mood:   Improved   Affect:  Pleasant   Thought Process:  Coherent  Orientation:  Full (Time, Place, and Person)  Thought Content:  WDL and Logical  Suicidal Thoughts: None   Homicidal Thoughts:  No  Memory:  Immediate;   Fair Recent;   Fair Remote;   Fair  Judgement:  Fair  Insight:  Fair  Psychomotor Activity:  Normal  Concentration:  Concentration: Fair and Attention Span: Fair  Recall:  [employer] of Knowledge:Fair  Language: Fair  Akathisia:  No  Handed:  Right  AIMS (if indicated):  na  Assets:  Communication Skills Desire for Improvement Financial Resources/Insurance Physical Health Social Support Vocational/Educational  ADL's:  Intact  Cognition: WNL  Sleep:  good    Treatment Plan Summary:  Generalized anxiety disorder Continue Luvox at 100 mg once daily as started in the hospital Continue therapy with Royal Piedra  Bipolar 2 disorder Continue Lamictal at 50mg  po bid   Return to clinic in 3 months time or call before if needed.   Elvin So, MD 11/19/20188:54 AM

## 2017-08-30 MED FILL — FLUVOXAMINE MALEATE 100 MG: 100 | 30 days supply | Qty: 30 | Fill #0

## 2017-09-02 ENCOUNTER — Encounter: Payer: Self-pay | Admitting: Medical

## 2017-09-02 ENCOUNTER — Ambulatory Visit (INDEPENDENT_AMBULATORY_CARE_PROVIDER_SITE_OTHER): Payer: 59 | Admitting: Medical

## 2017-09-02 VITALS — BP 101/65 | HR 78 | Temp 98.5°F | Resp 16 | Wt 139.0 lb

## 2017-09-02 DIAGNOSIS — R05 Cough: Secondary | ICD-10-CM | POA: Diagnosis not present

## 2017-09-02 DIAGNOSIS — J40 Bronchitis, not specified as acute or chronic: Secondary | ICD-10-CM

## 2017-09-02 DIAGNOSIS — R059 Cough, unspecified: Secondary | ICD-10-CM

## 2017-09-02 DIAGNOSIS — J0100 Acute maxillary sinusitis, unspecified: Secondary | ICD-10-CM

## 2017-09-02 DIAGNOSIS — M791 Myalgia, unspecified site: Secondary | ICD-10-CM | POA: Diagnosis not present

## 2017-09-02 MED ORDER — AMOXICILLIN-POT CLAVULANATE 875-125 MG PO TABS: 1.0000 | ORAL_TABLET | Freq: Two times a day (BID) | ORAL | 0 refills | Status: DC

## 2017-09-02 MED ORDER — FLUTICASONE PROPIONATE 50 MCG/ACT NA SUSP
2.0000 | Freq: Every day | NASAL | 1 refills | Status: DC
Start: 2017-09-02 — End: 2017-11-01

## 2017-09-02 MED ORDER — BENZONATATE 100 MG PO CAPS: 100.0000 mg | ORAL_CAPSULE | Freq: Three times a day (TID) | ORAL | 0 refills | Status: DC | PRN

## 2017-09-02 MED FILL — AMOX-CLAV 875-125 MG TABLET: 875-125 | 10 days supply | Qty: 20 | Fill #0

## 2017-09-02 MED FILL — BENZONATATE 100 MG CAPS: 100 | 7 days supply | Qty: 21 | Fill #0

## 2017-09-02 MED FILL — FLUTICASONE PROP 50 MCG SPR: 50 | 30 days supply | Qty: 16 | Fill #0

## 2017-09-02 NOTE — Patient Instructions (Addendum)
You appear to have bronchitis and sinusitis(more sinus infection). Rest hydrate and tylenol for fever. I am prescribing cough medicine benzonatate, and augmentin antibiotic. For your nasal congestion rx flonase.  Possible above secondary infection following flu like syndrome. Rapid flu test negative and presenting more than 48 hours after onset.  Strep test negative.  You should gradually get better. If not then notify us and would recommend a chest xray.  Follow up in 7-10 days or as needed

## 2017-09-02 NOTE — Progress Notes (Signed)
Subjective:    Patient ID: Victoria Simmons, female    DOB: 08/14/1996, 21 y.o.   MRN: [id number]  HPI  Pt in with recent symptoms since Friday in am. She had bodyaches, st, ha, nasal congestion and fatigue. Also coughing up mucous with sinus pain.   Pt did get flu vaccine in august or sept.  Pt in past had the flu and she states achiness is almost flu like.  LMP- on presently.   Pt work at daycare.     Review of Systems  Constitutional: Positive for chills, fatigue and fever.  HENT: Positive for congestion, sinus pressure, sinus pain and sore throat. Negative for ear pain, postnasal drip and rhinorrhea.   Respiratory: Negative for cough, chest tightness, shortness of breath and wheezing.   Cardiovascular: Negative for chest pain and palpitations.  Gastrointestinal: Negative for abdominal pain, nausea and vomiting.  Genitourinary: Negative for dysuria.  Musculoskeletal: Positive for myalgias.  Skin: Negative for rash.  Neurological: Negative for dizziness, seizures, weakness and light-headedness.  Hematological: Negative for adenopathy. Does not bruise/bleed easily.  Psychiatric/Behavioral: Negative for behavioral problems, confusion, dysphoric mood and sleep disturbance. The patient is not nervous/anxious.     Past Medical History:  Diagnosis Date  . Ovarian torsion 2008     Social History   Socioeconomic History  . Marital status: Single    Spouse name: Not on file  . Number of children: 0  . Years of education: Not on file  . Highest education level: Some college, no degree  Social Needs  . Financial resource strain: Not hard at all  . Food insecurity - worry: Never true  . Food insecurity - inability: Never true  . Transportation needs - medical: No  . Transportation needs - non-medical: No  Occupational History  . Occupation: [employer]    Comment: full time  Tobacco Use  . Smoking status: Never Smoker  . Smokeless tobacco: Never Used    Substance and Sexual Activity  . Alcohol use: No  . Drug use: No  . Sexual activity: No  Other Topics Concern  . Not on file  Social History Narrative   ** Merged History Encounter **        Past Surgical History:  Procedure Laterality Date  . OVARY SURGERY      Family History  Problem Relation Age of Onset  . Hepatitis B Mother   . Anxiety disorder Mother   . Hypertension Father   . ADD / ADHD Brother     No Known Allergies  Current Outpatient Medications on File Prior to Visit  Medication Sig Dispense Refill  . fluvoxaMINE (LUVOX) 100 MG tablet Take 1 tablet (100 mg total) at bedtime by mouth. 30 tablet 2  . lamoTRIgine (LAMICTAL) 100 MG tablet Take 1 tablet by mouth daily at night. 30 tablet 2  . SPRINTEC 28 0.25-35 MG-MCG tablet TAKE 1 TABLET BY MOUTH EVERY DAY 28 tablet 11  . traZODone (DESYREL) 100 MG tablet Take 1 tablet (100 mg total) by mouth at bedtime. 30 tablet 1   No current facility-administered medications on file prior to visit.     BP 101/65   Pulse 78   Temp 98.5 F (36.9 C) (Oral)   Resp 16   Wt 139 lb (63 kg)   SpO2 99%   BMI 23.13 kg/m       Objective:   Physical Exam  General  Mental Status - Alert. General Appearance - Well groomed. Not  in acute distress.  Skin Rashes- No Rashes.  HEENT Head- Normal. Ear Auditory Canal - Left- Normal. Right - Normal.Tympanic Membrane- Left- Normal. Right- Normal. Eye Sclera/Conjunctiva- Left- Normal. Right- Normal. Nose & Sinuses Nasal Mucosa- Left-  Boggy and Congested. Right-  Boggy and  Congested.Bilateral maxillary and frontal sinus pressure. Mouth & Throat Lips: Upper Lip- Normal: no dryness, cracking, pallor, cyanosis, or vesicular eruption. Lower Lip-Normal: no dryness, cracking, pallor, cyanosis or vesicular eruption. Buccal Mucosa- Bilateral- No Aphthous ulcers. Oropharynx- No Discharge or Erythema. Tonsils: Characteristics- Bilateral- No Erythema or Congestion. Size/Enlargement-  Bilateral- No enlargement. Discharge- bilateral-None.  Neck Neck- Supple. No Masses.   Chest and Lung Exam Auscultation: Breath Sounds:-Clear even and unlabored.  Cardiovascular Auscultation:Rythm- Regular, rate and rhythm. Murmurs & Other Heart Sounds:Ausculatation of the heart reveal- No Murmurs.  Lymphatic Head & Neck General Head & Neck Lymphatics: Bilateral: Description- No Localized lymphadenopathy.       Assessment & Plan:   You appear to have bronchitis and sinusitis(more sinus infection). Rest hydrate and tylenol for fever. I am prescribing cough medicine benzonatate, augmentin  antibiotic. For your nasal congestion rx flonase.  Possible above secondary infection following flu like syndrome. Rapid flu test negative and presenting more than 48 hours after onset.  Strep test negative.  You should gradually get better. If not then notify us and would recommend a chest xray.  Follow up in 7-10 days or as needed  Adylene Dlugosz, Percell Miller, [employer]

## 2017-09-03 ENCOUNTER — Encounter: Payer: Self-pay | Admitting: Medical

## 2017-09-11 ENCOUNTER — Telehealth: Payer: Self-pay

## 2017-09-11 MED ORDER — ONDANSETRON 4 MG PO TBDP: 4.0000 mg | ORAL_TABLET | Freq: Three times a day (TID) | ORAL | 0 refills | Status: DC | PRN

## 2017-09-11 MED FILL — ONDANSETRON ODT 4 MG TABLET: 4 | 8 days supply | Qty: 20 | Fill #0

## 2017-09-11 NOTE — Telephone Encounter (Signed)
Patient walked in complaining of dizziness, nausea and body cramping with some SOB.    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  Palpation to abdomen reveals tenderness/bloating throughout lower abdomen. Muscle cramping, body aches and pains.  She is taking midol for muscle aches and pains. Patient states no chance she is pregnant Skin color is generally pale.  Denies any evidence of blood and does not feel faint.  Discussed symptoms with Dr. Lorelei Pont.  Both agree patient is stable and should treat with Zofran prn, rest, push gatorade and clear liquid - bland diet as tolerated over next 24 - 48 hours.   Did give patient 1 dose of Zofran 4mg  ODT here in the office and gave work note to be out until Monday. 09/16/17.  R/X also sent in (per Dr. Serita Grit approval) for Zofran ODT 4mg  1 q 8 hrs prn nausea #20, no refills to patient's pharmacy.   Patient instructed if not improving to call us in next 48 hours or go to ER if more severe or concerning symptoms develop.  Patient verbalizes understanding and agreement with treatment plan.

## 2017-09-12 MED FILL — traZODone HCL 100 MG TABS: 100 | 30 days supply | Qty: 30 | Fill #1

## 2017-09-16 ENCOUNTER — Ambulatory Visit: Payer: 59 | Admitting: Licensed Clinical Social Worker

## 2017-10-07 MED FILL — lamoTRIgine 100 MG TABS: 100 | 30 days supply | Qty: 30 | Fill #1

## 2017-10-10 ENCOUNTER — Other Ambulatory Visit: Payer: Self-pay | Admitting: Psychiatry

## 2017-10-11 MED FILL — FLUVOXAMINE MALEATE 100 MG: 100 | 30 days supply | Qty: 30 | Fill #1

## 2017-10-14 ENCOUNTER — Other Ambulatory Visit: Payer: Self-pay | Admitting: Psychiatry

## 2017-10-14 ENCOUNTER — Ambulatory Visit (INDEPENDENT_AMBULATORY_CARE_PROVIDER_SITE_OTHER): Payer: No Typology Code available for payment source | Admitting: Licensed Clinical Social Worker

## 2017-10-14 DIAGNOSIS — F331 Major depressive disorder, recurrent, moderate: Secondary | ICD-10-CM

## 2017-10-14 DIAGNOSIS — F411 Generalized anxiety disorder: Secondary | ICD-10-CM

## 2017-10-14 MED FILL — traZODone HCL 100 MG TABS: 100 | 30 days supply | Qty: 30 | Fill #0

## 2017-10-14 NOTE — Telephone Encounter (Signed)
pt is in the office today to see nicole peacock, LCSW, pt needs refill on trazodone. pt seen dr. Einar Grad 08-26-17 has appt on  11-26-17.

## 2017-10-29 NOTE — Progress Notes (Signed)
   THERAPIST PROGRESS NOTE  Session Time: 68min  Participation Level: Active  Behavioral Response: CasualAlertEuthymic  Type of Therapy: Individual Therapy  Treatment Goals addressed: Anxiety and Coping  Interventions: CBT and Motivational Interviewing  Summary: Victoria Simmons is a 22 y.o. female who presents with a reduction in her symptoms.  Patient reports that her mood has been "really good."  SHe denies any sadness or isolation.  She reports that she continues to have a positive support system.  She was able to elaborate on her relationship with her parents.  She reports that they continue to be loving and supportive.  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  She reports that at times she can get overwhelmed.  She reports using "4,7,8" deep breathing technique to calm herself.  She reports that one child will be crying while another is doing something inappropriate.  She reports that is when she gets stressed out. She does report that things have been easier since she started the job.  SHe reports that she finds herself being sick more often.  She reports an overall positive experience at work. She reports that she enjoys her current school schedule.  She reports that having more than 2 classes per semester is too much too handle for her.  She reports having good grades and thinking about transferring to another school in about a year.  She reports that she continues to want to be in the field of [employer].   She reports that she looks forward to trips and vacationing with her best friends and family.  SHe reports that self care is helping her with daily life stressors.  Discussion of her diagnosis what it is and the symptoms of it.  Factors that contribute to client's ongoing depressive symptoms were discussed and include real and perceived feelings of isolation, criticism, rejection, shame and guilt.   Suicidal/Homicidal:  No  Therapist Response: Therapist actively listened as Patient reported current mood and discussed stressors. Processed with patient about her symptoms and behaviors.  Assisted Patient with understanding her diagnosis.  Discussed progress and regression while at work and at home.  Discussed self awareness and the importance of understanding body cues. Explored the need of self care and being able to look forward to fun activities.  Plan: Return again in2weeks.  Diagnosis: Axis I: Depression, Anxiety    Axis II: No diagnosis    Lubertha South, LCSW 10/14/2017

## 2017-11-01 ENCOUNTER — Encounter: Payer: Self-pay | Admitting: Family Medicine

## 2017-11-01 ENCOUNTER — Ambulatory Visit (INDEPENDENT_AMBULATORY_CARE_PROVIDER_SITE_OTHER): Payer: No Typology Code available for payment source | Admitting: Family Medicine

## 2017-11-01 DIAGNOSIS — J0110 Acute frontal sinusitis, unspecified: Secondary | ICD-10-CM | POA: Diagnosis not present

## 2017-11-01 MED ORDER — BENZONATATE 200 MG PO CAPS: 200.0000 mg | ORAL_CAPSULE | Freq: Three times a day (TID) | ORAL | 1 refills | Status: DC | PRN

## 2017-11-01 MED ORDER — AMOXICILLIN-POT CLAVULANATE 875-125 MG PO TABS: 1.0000 | ORAL_TABLET | Freq: Two times a day (BID) | ORAL | 0 refills | Status: DC

## 2017-11-01 MED ORDER — FLUTICASONE PROPIONATE 50 MCG/ACT NA SUSP: 2.0000 | Freq: Every day | NASAL | Status: DC

## 2017-11-01 NOTE — Patient Instructions (Signed)
Rest and fluids.  Start augmentin.  Tessalon for cough.  Out of work for now.  Take care.  Glad to see you.  Update [name] as needed.

## 2017-11-01 NOTE — Progress Notes (Signed)
Known flu contacts at work.    Sx started about 1 week ago with ST and cough.  ST got better, but then had fevers over the last weekend.  The fevers got better but aches continued.  Then started vomiting in the last 2 days with more cough now.  She thought she was getting over 1 illness and caught another illness.    Last fever was about 5 days ago.  Some sputum.  Rhinorrhea.  posttussive emesis.  Some ear pain.  Some HA, episodic.    Had a flu shot prev.    Meds, vitals, and allergies reviewed.   ROS: Per HPI unless specifically indicated in ROS section   GEN: nad, alert and oriented HEENT: mucous membranes moist, tm w/o erythema, nasal exam w/o erythema, clear discharge noted,  OP with cobblestoning, frontal sinuses ttp B NECK: supple w/o LA CV: rrr.   PULM: ctab, no inc wob EXT: no edema

## 2017-11-03 DIAGNOSIS — J0110 Acute frontal sinusitis, unspecified: Secondary | ICD-10-CM | POA: Insufficient documentation

## 2017-11-03 NOTE — Assessment & Plan Note (Signed)
It wouldn't make sense to test flu or treat at this point, d/w pt.  Presumed sinus infection, given her exam.  Okay for outpatient follow-up.  Supportive care.  Start Augmentin.  Update me as needed. She agrees.  See AVS.

## 2017-11-12 MED FILL — traZODone HCL 100 MG TABS: 100 | 30 days supply | Qty: 30 | Fill #1

## 2017-11-12 MED FILL — lamoTRIgine 100 MG TABS: 100 | 30 days supply | Qty: 30 | Fill #2

## 2017-11-12 MED FILL — FLUVOXAMINE MALEATE 100 MG: 100 | 30 days supply | Qty: 30 | Fill #2

## 2017-11-21 ENCOUNTER — Ambulatory Visit (INDEPENDENT_AMBULATORY_CARE_PROVIDER_SITE_OTHER): Payer: No Typology Code available for payment source | Admitting: Obstetrics and Gynecology

## 2017-11-21 ENCOUNTER — Other Ambulatory Visit (HOSPITAL_COMMUNITY)
Admission: RE | Admit: 2017-11-21 | Discharge: 2017-11-21 | Disposition: A | Discharge status: Home / Self Care | Payer: No Typology Code available for payment source | Source: Ambulatory Visit | Attending: Obstetrics and Gynecology | Admitting: Obstetrics and Gynecology

## 2017-11-21 ENCOUNTER — Encounter: Payer: Self-pay | Admitting: Obstetrics and Gynecology

## 2017-11-21 VITALS — BP 100/64 | HR 70 | Resp 14 | Ht 65.5 in | Wt 136.6 lb

## 2017-11-21 DIAGNOSIS — R1031 Right lower quadrant pain: Secondary | ICD-10-CM

## 2017-11-21 DIAGNOSIS — Z01411 Encounter for gynecological examination (general) (routine) with abnormal findings: Secondary | ICD-10-CM | POA: Insufficient documentation

## 2017-11-21 DIAGNOSIS — Z01419 Encounter for gynecological examination (general) (routine) without abnormal findings: Secondary | ICD-10-CM | POA: Diagnosis not present

## 2017-11-21 NOTE — Patient Instructions (Signed)

## 2017-11-21 NOTE — Progress Notes (Signed)
22 y.o. G0P0 history on file. Single Caucasian female here for annual exam.    Here with her mother today who was present for most of the visit.   Got her period this morning.  Heavy flow.   Every other month, starting in December 2018, she has pain in right lower quadrant, nausea and vomiting, right leg pain, and right back pain.  Pain goes away with end of cycle.  Rare pain outside menses.  Had a right adnexal torsion and had ovarian suspension? At age 12 at [facility]. Menarche age 53.  Irregular menses, so she is taking OCPs through PCP since age 39.  Does have menses every month.  No missed pills.   Taking Lamictal 100 mg for the last 6 months.  Dosage increase over time.   Had a colonoscopy and endoscopy in Dec. 2107 due to nausea and vomiting.  Results were normal.  Dx was lactose intolerance.    PCP: Arnette Norris, MD  Patient's last menstrual period was 11/21/2017 (exact date).     Period Cycle (Days): 30 Period Duration (Days): 5 Period Pattern: Regular Menstrual Flow: Heavy Menstrual Control: Maxi pad Menstrual Control Change Freq (Hours): every 3-4 hours on heaviest day Dysmenorrhea: (!) Moderate Dysmenorrhea Symptoms: Cramping, Nausea     Sexually active: No.   Never.  The current method of family planning is OCP (estrogen/progesterone).   Female and female preference.  Exercising: No.  none Smoker:  no  Health Maintenance: Pap:  never History of abnormal Pap:  n/a MMG:  n/a Colonoscopy:  09/2016 normal--done d/t N & V. BMD:   n/a  Result  n/a TDaP:  201- Gardasil:   Unsure.  Will check with pediatrician.  HIV:n/a Hep C:n/a Screening Labs:      reports that  has never smoked. she has never used smokeless tobacco. She reports that she does not drink alcohol or use drugs.  Past Medical History:  Diagnosis Date  . Anxiety   . Depression   . Dysmenorrhea   . Ovarian torsion 2008    Past Surgical History:  Procedure Laterality Date  . OVARY  SURGERY     ?Rt.ovarian torsion at age 84    Current Outpatient Medications  Medication Sig Dispense Refill  . fluvoxaMINE (LUVOX) 100 MG tablet Take 1 tablet (100 mg total) at bedtime by mouth. 30 tablet 2  . lamoTRIgine (LAMICTAL) 100 MG tablet Take 1 tablet by mouth daily at night. 30 tablet 2  . SPRINTEC 28 0.25-35 MG-MCG tablet TAKE 1 TABLET BY MOUTH EVERY DAY 28 tablet 11  . traZODone (DESYREL) 100 MG tablet Take 1 tablet (100 mg total) by mouth at bedtime. 30 tablet 1   No current facility-administered medications for this visit.     Family History  Problem Relation Age of Onset  . Hepatitis B Mother   . Anxiety disorder Mother   . Hypertension Father   . ADD / ADHD Brother   . Ovarian cancer Paternal Grandmother     ROS:  Pertinent items are noted in HPI.  Otherwise, a comprehensive ROS was negative.  Exam:   BP 100/64 (BP Location: Right Arm, Patient Position: Sitting, Cuff Size: Normal)   Pulse 70   Resp 14   Ht 5' 5.5" (1.664 m)   Wt 136 lb 9.6 oz (62 kg)   LMP 11/21/2017 (Exact Date)   BMI 22.39 kg/m     General appearance: alert, cooperative and appears stated age Head: Normocephalic, without obvious abnormality, atraumatic  Neck: no adenopathy, supple, symmetrical, trachea midline and thyroid normal to inspection and palpation Lungs: clear to auscultation bilaterally Breasts: normal appearance, no masses or tenderness, No nipple retraction or dimpling, No nipple discharge or bleeding, No axillary or supraclavicular adenopathy Heart: regular rate and rhythm Abdomen: soft, non-tender; no masses, no organomegaly Extremities: extremities normal, atraumatic, no cyanosis or edema Skin: Skin color, texture, turgor normal. No rashes or lesions Lymph nodes: Cervical, supraclavicular, and axillary nodes normal. No abnormal inguinal nodes palpated Neurologic: Grossly normal  Pelvic: External genitalia:  no lesions              Urethra:  normal appearing urethra with  no masses, tenderness or lesions              Bartholins and Skenes: normal                 Vagina: normal appearing vagina with normal color and discharge, no lesions              Cervix: no lesions.  Mild bloody mucous.               Pap taken: Yes.   Bimanual Exam:  Uterus:  normal size, contour, position, consistency, mobility, non-tender              Adnexa: no mass, fullness, tenderness              Rectal exam: Yes.  .  Confirms.              Anus:  normal sphincter tone, no lesions  Chaperone was present for exam.  Assessment:   Well woman visit. RLQ pain during menses.  On combined OCPs. On Lamictal. Status post laparoscopic surgery for ovarian torsion.   Plan: Mammogram screening discussed. Recommended self breast awareness.  Taught self breast exam.  Pap and HR HPV as above. Guidelines for Calcium, Vitamin D, regular exercise program including cardiovascular and weight bearing exercise. Will check lipids, CMP, and CBC. I discussed pelvic pain and dysmenorrhea.  Return for pelvic ultrasound.  Aleve 2 po bid prn pain.  May benefit from continuous OCPs.  I did discuss female and female condoms.  They will check about her prior Gardasil vaccination.  Get surgery and pathology report for prior adnexal surgery.  Follow up annually and prn.   After visit summary provided.

## 2017-11-22 LAB — LIPID PANEL
Chol/HDL Ratio: 3.8 ratio (ref 0.0–4.4)
Cholesterol, Total: 174 mg/dL (ref 100–199)
HDL: 46 mg/dL (ref >39)
LDL Calculated: 102 mg/dL — ABNORMAL HIGH (ref 0–99)
Triglycerides: 128 mg/dL (ref 0–149)
VLDL Cholesterol Cal: 26 mg/dL (ref 5–40)

## 2017-11-22 LAB — CBC
Hematocrit: 37.2 % (ref 34.0–46.6)
Hemoglobin: 12.5 g/dL (ref 11.1–15.9)
MCH: 28.3 pg (ref 26.6–33.0)
MCHC: 33.6 g/dL (ref 31.5–35.7)
MCV: 84 fL (ref 79–97)
Platelets: 309 10*3/uL (ref 150–379)
RBC: 4.42 x10E6/uL (ref 3.77–5.28)
RDW: 14.2 % (ref 12.3–15.4)
WBC: 5.7 10*3/uL (ref 3.4–10.8)

## 2017-11-22 LAB — COMPREHENSIVE METABOLIC PANEL
ALT: 16 IU/L (ref 0–32)
AST: 15 IU/L (ref 0–40)
Albumin/Globulin Ratio: 1.4 (ref 1.2–2.2)
Albumin: 4 g/dL (ref 3.5–5.5)
Alkaline Phosphatase: 58 IU/L (ref 39–117)
BUN/Creatinine Ratio: 8 — ABNORMAL LOW (ref 9–23)
BUN: 6 mg/dL (ref 6–20)
Bilirubin Total: <0.2 mg/dL (ref 0.0–1.2)
CO2: 19 mmol/L — ABNORMAL LOW (ref 20–29)
Calcium: 9.2 mg/dL (ref 8.7–10.2)
Chloride: 108 mmol/L — ABNORMAL HIGH (ref 96–106)
Creatinine, Ser: 0.8 mg/dL (ref 0.57–1.00)
GFR calc Af Amer: 122 mL/min/{1.73_m2} (ref >59)
GFR calc non Af Amer: 106 mL/min/{1.73_m2} (ref >59)
Globulin, Total: 2.8 g/dL (ref 1.5–4.5)
Glucose: 89 mg/dL (ref 65–99)
Potassium: 4.1 mmol/L (ref 3.5–5.2)
Sodium: 142 mmol/L (ref 134–144)
Total Protein: 6.8 g/dL (ref 6.0–8.5)

## 2017-11-22 LAB — CYTOLOGY - PAP: Diagnosis: NEGATIVE

## 2017-11-25 ENCOUNTER — Ambulatory Visit: Payer: No Typology Code available for payment source | Admitting: Licensed Clinical Social Worker

## 2017-11-25 ENCOUNTER — Telehealth: Payer: Self-pay | Admitting: Obstetrics and Gynecology

## 2017-11-25 DIAGNOSIS — F331 Major depressive disorder, recurrent, moderate: Secondary | ICD-10-CM | POA: Diagnosis not present

## 2017-11-25 DIAGNOSIS — F411 Generalized anxiety disorder: Secondary | ICD-10-CM | POA: Diagnosis not present

## 2017-11-25 NOTE — Telephone Encounter (Signed)
Patient returned call. Reviewed benefit for recommended ultrasound. Patient understood and agreeable. Patient ready to schedule. Patient scheduled 12/05/17 with Dr Quincy Simmonds. Patient aware of appointment date, arrival time and cancellation policy  No further questions. Ok to close

## 2017-11-25 NOTE — Telephone Encounter (Signed)
Called patient to review benefits for a recommended ultrasound. Left voicemail message requesting a return call.

## 2017-11-26 ENCOUNTER — Other Ambulatory Visit: Payer: Self-pay

## 2017-11-26 ENCOUNTER — Ambulatory Visit: Payer: No Typology Code available for payment source | Admitting: Psychiatry

## 2017-11-26 ENCOUNTER — Encounter: Payer: Self-pay | Admitting: Psychiatry

## 2017-11-26 VITALS — BP 96/63 | HR 75 | Temp 97.7°F | Wt 142.0 lb

## 2017-11-26 DIAGNOSIS — F411 Generalized anxiety disorder: Secondary | ICD-10-CM | POA: Diagnosis not present

## 2017-11-26 DIAGNOSIS — F331 Major depressive disorder, recurrent, moderate: Secondary | ICD-10-CM | POA: Diagnosis not present

## 2017-11-26 MED ORDER — FLUVOXAMINE MALEATE 100 MG PO TABS: 100.0000 mg | ORAL_TABLET | Freq: Every day | ORAL | 2 refills | Status: DC

## 2017-11-26 MED ORDER — LAMOTRIGINE 100 MG PO TABS: ORAL_TABLET | ORAL | 2 refills | Status: DC

## 2017-11-26 MED ORDER — TRAZODONE HCL 100 MG PO TABS: 100.0000 mg | ORAL_TABLET | Freq: Every day | ORAL | 1 refills | Status: DC

## 2017-11-26 NOTE — Progress Notes (Signed)
Psychiatric progress note  Patient Identification: Victoria Simmons MRN:  [id number] Date of Evaluation:  11/26/2017 Referral Source: Dr.Aaron Chief Complaint: some depression recently  Chief Complaint    Follow-up; Medication Refill     Visit Diagnosis:    ICD-10-CM   1. Moderate episode of recurrent major depressive disorder (HCC) F33.1   2. GAD (generalized anxiety disorder) F41.1     History of Present Illness: Patient was seen today for follow-up of depression and anxiety. She reports that she has been somewhat depressed over the last month. 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 She has been coping by staying busy. States that she has been hiking quite a bit. Spending time with friends on the weekends. States that being busy has helped her mood somewhat. She feels like she is sleeping too much and does not have the energy when she wakes up. She has been compliant with her medications and denies any side effects. She has been driving and states that she will soon try to go to the [facility] and obtain a license. Denies any suicidal thoughts.  PHQ 9 of 8  Past Psychiatric History: Saw a psychologist twice earlier in the year.  Previous Psychotropic Medications: Yes , zoloft- stopped working at 50mg  , tried Prozac couple years ago but did not help.   Substance Abuse History in the last 12 months:  No.  Consequences of Substance Abuse: Negative  Past Medical History:  Past Medical History:  Diagnosis Date  . Anxiety   . Depression   . Dysmenorrhea   . Ovarian torsion 2008    Past Surgical History:  Procedure Laterality Date  . OVARY SURGERY     ?Rt.ovarian torsion at age 21    Family Psychiatric History: Mother has anxiety  Family History:  Family History  Problem Relation Age of Onset  . Hepatitis B Mother   . Anxiety disorder Mother   . Hypertension Father   . ADD / ADHD Brother   . Ovarian cancer Paternal Grandmother     Social History:    Social History   Socioeconomic History  . Marital status: Single    Spouse name: None  . Number of children: 0  . Years of education: None  . Highest education level: Some college, no degree  Social Needs  . Financial resource strain: Not hard at all  . Food insecurity - worry: Never true  . Food insecurity - inability: Never true  . Transportation needs - medical: No  . Transportation needs - non-medical: No  Occupational History  . Occupation: [employer]    Comment: full time  Tobacco Use  . Smoking status: Never Smoker  . Smokeless tobacco: Never Used  Substance and Sexual Activity  . Alcohol use: No  . Drug use: No  . Sexual activity: No    Birth control/protection: Pill    Comment: sprintec  Other Topics Concern  . None  Social History Narrative   ** Merged History Encounter **        Additional Social History: Lives with biological parents and 60 yo brother and 11 yo sister.  Allergies:  No Known Allergies  Metabolic Disorder Labs: Lab Results  Component Value Date   HGBA1C 4.9 05/14/2017   MPG 94 05/14/2017   No results found for: PROLACTIN Lab Results  Component Value Date   CHOL 174 11/21/2017   TRIG 128 11/21/2017   HDL 46 11/21/2017   CHOLHDL 3.8 11/21/2017  VLDL 30 05/14/2017   LDLCALC 102 (H) 11/21/2017   LDLCALC 163 (H) 05/14/2017     Current Medications: Current Outpatient Medications  Medication Sig Dispense Refill  . fluvoxaMINE (LUVOX) 100 MG tablet Take 1 tablet (100 mg total) at bedtime by mouth. 30 tablet 2  . lamoTRIgine (LAMICTAL) 100 MG tablet Take 1 tablet by mouth daily at night. 30 tablet 2  . SPRINTEC 28 0.25-35 MG-MCG tablet TAKE 1 TABLET BY MOUTH EVERY DAY 28 tablet 11  . traZODone (DESYREL) 100 MG tablet Take 1 tablet (100 mg total) by mouth at bedtime. 30 tablet 1   No current facility-administered medications for this visit.     Neurologic: Headache: No Seizure:  No Paresthesias:No  Musculoskeletal: Strength & Muscle Tone: within normal limits Gait & Station: normal Patient leans: N/A  Psychiatric Specialty Exam: ROS  Blood pressure 96/63, pulse 75, temperature 97.7 F (36.5 C), temperature source Oral, weight 64.4 kg (142 lb), last menstrual period 11/21/2017.Body mass index is 23.27 kg/m.  General Appearance: Casual  Eye Contact:  Fair  Speech:  Clear and Coherent  Volume:  Normal  Mood:  dysphoric  Affect:  Pleasant   Thought Process:  Coherent  Orientation:  Full (Time, Place, and Person)  Thought Content:  WDL and Logical  Suicidal Thoughts: None   Homicidal Thoughts:  No  Memory:  Immediate;   Fair Recent;   Fair Remote;   Fair  Judgement:  Fair  Insight:  Fair  Psychomotor Activity:  Normal  Concentration:  Concentration: Fair and Attention Span: Fair  Recall:  [employer] of Knowledge:Fair  Language: Fair  Akathisia:  No  Handed:  Right  AIMS (if indicated):  na  Assets:  Communication Skills Desire for Improvement Financial Resources/Insurance Physical Health Social Support Vocational/Educational  ADL's:  Intact  Cognition: WNL  Sleep:  good    Treatment Plan Summary:  Generalized anxiety disorder Continue Luvox at 100 mg once daily. Continue therapy with Royal Piedra  Bipolar 2 disorder Continue Lamictal at 50mg  po bid   Return to clinic in 1 months time or call before if needed.   Elvin So, MD 2/19/20198:51 AM

## 2017-12-03 ENCOUNTER — Encounter: Payer: Self-pay | Admitting: Obstetrics and Gynecology

## 2017-12-05 ENCOUNTER — Ambulatory Visit: Payer: No Typology Code available for payment source | Admitting: Obstetrics and Gynecology

## 2017-12-05 ENCOUNTER — Ambulatory Visit (INDEPENDENT_AMBULATORY_CARE_PROVIDER_SITE_OTHER): Payer: No Typology Code available for payment source

## 2017-12-05 ENCOUNTER — Encounter: Payer: Self-pay | Admitting: Obstetrics and Gynecology

## 2017-12-05 ENCOUNTER — Other Ambulatory Visit: Payer: Self-pay

## 2017-12-05 VITALS — BP 98/60 | HR 80 | Resp 16 | Ht 65.5 in | Wt 136.0 lb

## 2017-12-05 DIAGNOSIS — R1031 Right lower quadrant pain: Secondary | ICD-10-CM

## 2017-12-05 MED ORDER — IBUPROFEN 800 MG PO TABS: 800.0000 mg | ORAL_TABLET | Freq: Three times a day (TID) | ORAL | 2 refills | Status: DC | PRN

## 2017-12-05 MED ORDER — ETONOGESTREL-ETHINYL ESTRADIOL 0.12-0.015 MG/24HR VA RING: 1.0000 | VAGINAL_RING | VAGINAL | 0 refills | Status: DC

## 2017-12-05 MED FILL — NUVARING VAGINAL RING: 0.12-0.015 | 84 days supply | Qty: 3 | Fill #0

## 2017-12-05 NOTE — Patient Instructions (Signed)
Ethinyl Estradiol; Etonogestrel vaginal ring What is this medicine? ETHINYL ESTRADIOL; ETONOGESTREL (ETH in il es tra DYE ole; et oh noe JES trel) vaginal ring is a flexible, vaginal ring used as a contraceptive (birth control method). This medicine combines two types of female hormones, an estrogen and a progestin. This ring is used to prevent ovulation and pregnancy. Each ring is effective for one month. This medicine may be used for other purposes; ask your health care provider or pharmacist if you have questions. COMMON BRAND NAME(S): NuvaRing What should I tell my health care provider before I take this medicine? They need to know if you have or ever had any of these conditions: -abnormal vaginal bleeding -blood vessel disease or blood clots -breast, cervical, endometrial, ovarian, liver, or uterine cancer -diabetes -gallbladder disease -heart disease or recent heart attack -high blood pressure -high cholesterol -kidney disease -liver disease -migraine headaches -stroke -systemic lupus erythematosus (SLE) -tobacco smoker -an unusual or allergic reaction to estrogens, progestins, other medicines, foods, dyes, or preservatives -pregnant or trying to get pregnant -breast-feeding How should I use this medicine? Insert the ring into your vagina as directed. Follow the directions on the prescription label. The ring will remain place for 3 weeks and is then removed for a 1-week break. A new ring is inserted 1 week after the last ring was removed, on the same day of the week. Check often to make sure the ring is still in place, especially before and after sexual intercourse. If the ring was out of the vagina for an unknown amount of time, you may not be protected from pregnancy. Perform a pregnancy test and call your doctor. Do not use more often than directed. A patient package insert for the product will be given with each prescription and refill. Read this sheet carefully each time. The  sheet may change frequently. Contact your pediatrician regarding the use of this medicine in children. Special care may be needed. This medicine has been used in female children who have started having menstrual periods. Overdosage: If you think you have taken too much of this medicine contact a poison control center or emergency room at once. NOTE: This medicine is only for you. Do not share this medicine with others. What if I miss a dose? You will need to replace your vaginal ring once a month as directed. If the ring should slip out, or if you leave it in longer or shorter than you should, contact your health care professional for advice. What may interact with this medicine? Do not take this medicine with the following medication: -dasabuvir; ombitasvir; paritaprevir; ritonavir -ombitasvir; paritaprevir; ritonavir This medicine may also interact with the following medications: -acetaminophen -antibiotics or medicines for infections, especially rifampin, rifabutin, rifapentine, and griseofulvin, and possibly penicillins or tetracyclines -aprepitant -ascorbic acid (vitamin C) -atorvastatin -barbiturate medicines, such as phenobarbital -bosentan -carbamazepine -caffeine -clofibrate -cyclosporine -dantrolene -doxercalciferol -felbamate -grapefruit juice -hydrocortisone -medicines for anxiety or sleeping problems, such as diazepam or temazepam -medicines for diabetes, including pioglitazone -modafinil -mycophenolate -nefazodone -oxcarbazepine -phenytoin -prednisolone -ritonavir or other medicines for HIV infection or AIDS -rosuvastatin -selegiline -soy isoflavones supplements -St. John's wort -tamoxifen or raloxifene -theophylline -thyroid hormones -topiramate -warfarin This list may not describe all possible interactions. Give your health care provider a list of all the medicines, herbs, non-prescription drugs, or dietary supplements you use. Also tell them if you smoke,  drink alcohol, or use illegal drugs. Some items may interact with your medicine. What should I watch for while using   this medicine? Visit your doctor or health care professional for regular checks on your progress. You will need a regular breast and pelvic exam and Pap smear while on this medicine. Use an additional method of contraception during the first cycle that you use this ring. Do not use a diaphragm or female condom, as the ring can interfere with these birth control methods and their proper placement. If you have any reason to think you are pregnant, stop using this medicine right away and contact your doctor or health care professional. If you are using this medicine for hormone related problems, it may take several cycles of use to see improvement in your condition. Smoking increases the risk of getting a blood clot or having a stroke while you are using hormonal birth control, especially if you are more than 22 years old. You are strongly advised not to smoke. This medicine can make your body retain fluid, making your fingers, hands, or ankles swell. Your blood pressure can go up. Contact your doctor or health care professional if you feel you are retaining fluid. This medicine can make you more sensitive to the sun. Keep out of the sun. If you cannot avoid being in the sun, wear protective clothing and use sunscreen. Do not use sun lamps or tanning beds/booths. If you wear contact lenses and notice visual changes, or if the lenses begin to feel uncomfortable, consult your eye care specialist. In some women, tenderness, swelling, or minor bleeding of the gums may occur. Notify your dentist if this happens. Brushing and flossing your teeth regularly may help limit this. See your dentist regularly and inform your dentist of the medicines you are taking. If you are going to have elective surgery, you may need to stop using this medicine before the surgery. Consult your health care professional  for advice. This medicine does not protect you against HIV infection (AIDS) or any other sexually transmitted diseases. What side effects may I notice from receiving this medicine? Side effects that you should report to your doctor or health care professional as soon as possible: -breast tissue changes or discharge -changes in vaginal bleeding during your period or between your periods -chest pain -coughing up blood -dizziness or fainting spells -headaches or migraines -leg, arm or groin pain -severe or sudden headaches -stomach pain (severe) -sudden shortness of breath -sudden loss of coordination, especially on one side of the body -speech problems -symptoms of vaginal infection like itching, irritation or unusual discharge -tenderness in the upper abdomen -vomiting -weakness or numbness in the arms or legs, especially on one side of the body -yellowing of the eyes or skin Side effects that usually do not require medical attention (report to your doctor or health care professional if they continue or are bothersome): -breakthrough bleeding and spotting that continues beyond the 3 initial cycles of pills -breast tenderness -mood changes, anxiety, depression, frustration, anger, or emotional outbursts -increased sensitivity to sun or ultraviolet light -nausea -skin rash, acne, or brown spots on the skin -weight gain (slight) This list may not describe all possible side effects. Call your doctor for medical advice about side effects. You may report side effects to FDA at 1-800-FDA-1088. Where should I keep my medicine? Keep out of the reach of children. Store at room temperature between 15 and 30 degrees C (59 and 86 degrees F) for up to 4 months. The product will expire after 4 months. Protect from light. Throw away any unused medicine after the expiration date. NOTE: This   sheet is a summary. It may not cover all possible information. If you have questions about this medicine, talk  to your doctor, pharmacist, or health care provider.  2018 Elsevier/Gold Standard (2016-06-01 17:00:31)  

## 2017-12-05 NOTE — Progress Notes (Signed)
  Encounter reviewed by Dr. Aundria Rud.    ADDENDUM: VAGINAL U/S FOR Victoria Simmons, Victoria Simmons, DOB- 1996/01/26, YOF#[id number] THE ULTRASOUND IMAGES ATTACHED TO THIS EXAM WITH ID - [id number]  BELONG TO Victoria Simmons, Victoria Simmons REPORT; VAGINAL U/S ANTEVERTED UTERUS,NML SIZE AND SHAPE NO MYOMETRIAL MASS IDENTIFIED SYMMETRICAL ENDOMETRIUM, NO MASSES IDENTIFIED OVARIES APPEAR NORMAL, BILATERAL FOLLICLES NOTED </= 5MM NO ADNEXAL MASS IDENTIFIED NO FREE FLUID

## 2017-12-05 NOTE — Progress Notes (Signed)
GYNECOLOGY  VISIT   HPI: 22 y.o.   Single  Caucasian  female   No obstetric history on file. with Patient's last menstrual period was 11/21/2017 (exact date).   here for ultrasound  For right lower quadrant pain pain, nausea and vomiting during menses.   Mother present for discussion today.  On combined OCPs.  Having monthly menses.  Prior OCP was continuous and had cycle every 3 months and then was very painful. Usually no pain outside of cycle time.   Hx surgery for ovarian torsion.   States that the ultrasound was painful during her menstruation.   Not sexually active.   GYNECOLOGIC HISTORY: Patient's last menstrual period was 11/21/2017 (exact date). Contraception:  Sprintec. Menopausal hormone therapy:  n/a Last mammogram:  n/a Last pap smear:   11/21/17 Pap smear negative        OB History    Gravida Para Term Preterm AB Living   0 0 0 0 0     SAB TAB Ectopic Multiple Live Births   0 0 0             Patient Active Problem List   Diagnosis Date Noted  . Acute non-recurrent frontal sinusitis 11/03/2017  . Well woman exam 06/19/2017  . MDD (major depressive disorder) 06/19/2017  . HLD (hyperlipidemia) 06/19/2017  . Bipolar 2 disorder, major depressive episode ([place]) 05/13/2017  . Generalized anxiety disorder 05/13/2017  . Menorrhagia 01/25/2014  . Anxiety state 01/26/2013  . Family history of hypercholesterolemia 01/06/2013  . Murmur 01/06/2013    Past Medical History:  Diagnosis Date  . Anxiety   . Depression   . Dysmenorrhea   . Ovarian torsion 2008    Past Surgical History:  Procedure Laterality Date  . OVARY SURGERY  06/30/2007   dx laparoscopy, reduction of right adnexal torsion, bilateral oophoropexy of each ovary to posterior uterine fundus    Current Outpatient Medications  Medication Sig Dispense Refill  . fluvoxaMINE (LUVOX) 100 MG tablet Take 1 tablet (100 mg total) by mouth at bedtime. 30 tablet 2  . lamoTRIgine (LAMICTAL) 100 MG tablet  Take 1 tablet by mouth daily at night. 30 tablet 2  . SPRINTEC 28 0.25-35 MG-MCG tablet TAKE 1 TABLET BY MOUTH EVERY DAY 28 tablet 11  . traZODone (DESYREL) 100 MG tablet Take 1 tablet (100 mg total) by mouth at bedtime. 30 tablet 1   No current facility-administered medications for this visit.      ALLERGIES: Patient has no known allergies.  Family History  Problem Relation Age of Onset  . Hepatitis B Mother   . Anxiety disorder Mother   . Hypertension Father   . ADD / ADHD Brother   . Ovarian cancer Paternal Grandmother     Social History   Socioeconomic History  . Marital status: Single    Spouse name: Not on file  . Number of children: 0  . Years of education: Not on file  . Highest education level: Some college, no degree  Social Needs  . Financial resource strain: Not hard at all  . Food insecurity - worry: Never true  . Food insecurity - inability: Never true  . Transportation needs - medical: No  . Transportation needs - non-medical: No  Occupational History  . Occupation: [employer]    Comment: full time  Tobacco Use  . Smoking status: Never Smoker  . Smokeless tobacco: Never Used  Substance and Sexual Activity  . Alcohol use: No  .  Drug use: No  . Sexual activity: No    Birth control/protection: Pill    Comment: sprintec  Other Topics Concern  . Not on file  Social History Narrative   ** Merged History Encounter **        ROS:  Pertinent items are noted in HPI.  PHYSICAL EXAMINATION:    BP 98/60 (BP Location: Left Arm, Patient Position: Sitting, Cuff Size: Normal)   Pulse 80   Resp 16   Ht 5' 5.5" (1.664 m)   Wt 136 lb (61.7 kg)   LMP 11/21/2017 (Exact Date)   BMI 22.29 kg/m     General appearance: alert, cooperative and appears stated age   Pelvic US Normal uterus, ovaries.  No free fluid.  ASSESSMENT  RLQ pain.  Hx torsion of right adnexa and suspension of ovaries to uterine fundus.   PLAN  Discussed op report.   Discussed continuous contraception, Depo Provera, Progesterone IUDs, and laparoscopic surgery.  She opts for continuous NuvaRing. 3 month Rx. Follow up in 10 weeks, sooner as needed.   An After Visit Summary was printed and given to the patient.  __15____ minutes face to face time of which over 50% was spent in counseling.

## 2017-12-13 MED FILL — traZODone HCL 100 MG TABS: 100 | 30 days supply | Qty: 30 | Fill #0

## 2017-12-13 MED FILL — FLUVOXAMINE MALEATE 100 MG: 100 | 30 days supply | Qty: 30 | Fill #0

## 2017-12-13 MED FILL — lamoTRIgine 100 MG TABS: 100 | 30 days supply | Qty: 30 | Fill #0

## 2017-12-18 ENCOUNTER — Encounter: Payer: Self-pay | Admitting: Family Medicine

## 2017-12-18 ENCOUNTER — Ambulatory Visit (INDEPENDENT_AMBULATORY_CARE_PROVIDER_SITE_OTHER): Payer: No Typology Code available for payment source | Admitting: Family Medicine

## 2017-12-18 VITALS — BP 98/60 | HR 79 | Temp 98.7°F | Ht 65.5 in | Wt 140.8 lb

## 2017-12-18 DIAGNOSIS — J029 Acute pharyngitis, unspecified: Secondary | ICD-10-CM | POA: Diagnosis not present

## 2017-12-18 LAB — POCT RAPID STREP A (OFFICE): Rapid Strep A Screen: NEGATIVE

## 2017-12-18 NOTE — Progress Notes (Signed)
SUBJECTIVE: 22 y.o. female with sore throat, myalgias, swollen glands, pink eye, headache and fever for  days. No history of rheumatic fever. Other symptoms: coryza and congestion.   Current Outpatient Medications on File Prior to Visit  Medication Sig Dispense Refill  . etonogestrel-ethinyl estradiol (NUVARING) 0.12-0.015 MG/24HR vaginal ring Place 1 each vaginally every 28 (twenty-eight) days. Use the ring continuously. 3 each 0  . fluvoxaMINE (LUVOX) 100 MG tablet Take 1 tablet (100 mg total) by mouth at bedtime. 30 tablet 2  . ibuprofen (ADVIL,MOTRIN) 800 MG tablet Take 1 tablet (800 mg total) by mouth every 8 (eight) hours as needed. 30 tablet 2  . lamoTRIgine (LAMICTAL) 100 MG tablet Take 1 tablet by mouth daily at night. 30 tablet 2  . traZODone (DESYREL) 100 MG tablet Take 1 tablet (100 mg total) by mouth at bedtime. 30 tablet 1   No current facility-administered medications on file prior to visit.     No Known Allergies  Past Medical History:  Diagnosis Date  . Anxiety   . Depression   . Dysmenorrhea   . Ovarian torsion 2008    Past Surgical History:  Procedure Laterality Date  . OVARY SURGERY  06/30/2007   dx laparoscopy, reduction of right adnexal torsion, bilateral oophoropexy of each ovary to posterior uterine fundus    Family History  Problem Relation Age of Onset  . Hepatitis B Mother   . Anxiety disorder Mother   . Hypertension Father   . ADD / ADHD Brother   . Ovarian cancer Paternal Grandmother     Social History   Socioeconomic History  . Marital status: Single    Spouse name: Not on file  . Number of children: 0  . Years of education: Not on file  . Highest education level: Some college, no degree  Social Needs  . Financial resource strain: Not hard at all  . Food insecurity - worry: Never true  . Food insecurity - inability: Never true  . Transportation needs - medical: No  . Transportation needs - non-medical: No  Occupational History  .  Occupation: [employer]    Comment: full time  Tobacco Use  . Smoking status: Never Smoker  . Smokeless tobacco: Never Used  Substance and Sexual Activity  . Alcohol use: No  . Drug use: No  . Sexual activity: No    Birth control/protection: Pill    Comment: sprintec  Other Topics Concern  . Not on file  Social History Narrative   ** Merged History Encounter **       The PMH, PSH, Social History, Family History, Medications, and allergies have been reviewed in [facility], and have been updated if relevant.  OBJECTIVE:  BP 98/60 (BP Location: Left Arm, Patient Position: Sitting, Cuff Size: Normal)   Pulse 79   Temp 98.7 F (37.1 C) (Oral)   Ht 5' 5.5" (1.664 m)   Wt 140 lb 12.8 oz (63.9 kg)   LMP 11/21/2017 (Exact Date)   SpO2 97%   BMI 23.07 kg/m   Vitals as noted above. Appears alert, well appearing, and in no distress. Ears: bilateral TM's and external ear canals normal Oropharynx: erythematous Neck: supple, no significant adenopathy Lungs: clear to auscultation, no wheezes, rales or rhonchi, symmetric air entry Rapid Strep test is negative  ASSESSMENT: Viral pharyngitis  PLAN: Per orders. Gargle, use acetaminophen or other OTC analgesic, and take Rx fully as prescribed. Call if other family members develop similar symptoms. See prn.

## 2017-12-18 NOTE — Patient Instructions (Signed)
It sounds like an uncomplicated cold that we can treat at home.    Colds are very common and may make you feel uncomfortable.   Colds are caused by viruses, and no medicine or 'shot'will cure an uncomplicated cold.  FOR A STUFFY NOSE - USE NASAL WASHES:   Saline (salt water) nasal irrigation (nasal wash) is an effective and simple home remedy for treating stuffy nose and sinus congestion. The nose can be irrigated by pouring, spraying, or squirting salt water into the nose and then letting it run back out. * How it Helps: The salt water rinses out excess mucus, washes out any irritants (dust, allergens) that might be present, and moistens the nasal cavity. * Methods: There are several ways to perform nasal irrigation. You can use a saline nasal spray bottle (available over-the-counter), a rubber ear syringe, a medical syringe without the needle, or a NETI POT. STEP-BY-STEP INSTRUCTIONS:   STEP 1: Lean over a sink. STEP 2: Gently squirt or spray warm salt water into one of your nostrils.  STEP 3: Some of the water may run into the back of your throat. Spit this out. If you swallow the salt water it will not hurt you. STEP 4: Blow your nose to clean out the water and mucus.  STEP 5: Repeat steps 1-4 for the other nostril. You can do this a couple times a day if it seems to help you.   HOW TO MAKE SALINE (SALT WATER) NASAL WASH:   You can make your own saline nasal wash.  * Add 1/2 tsp of table salt to 1 cup (8 oz; 240 ml) of warm water. * You should use sterile, distilled, or previously boiled water for nasal irrigation.   FOR A RUNNY NOSE - BLOW YOUR NOSE:   If the skin around your nostrils gets irritated, apply a tiny amount of petroleum ointment to the nasal openings once or twice a day. MEDICINES FOR STUFFY OR RUNNY NOSE: If you have a very runny nose and you really think you need a medicine, you can try using a nasal decongestant for a couple days.   TREATMENT FOR ASSOCIATED SYMPTOMS  OF COLDS:   * Sore throat: throat lozenges, hard candy or warm chicken broth. * For muscle aches, headaches, or moderate fever (over 101 degrees F) (38.9 C) use acetaminophen every 4 hours.   Cough: use cough drops.   Hydrate: drink extra liquids. HUMIDIFIER: If the air in your home is dry, use a humidifier.   CONTAGIOUSNESS: * The cold virus is present in your nasal secretions. * Cover your nose and mouth with a tissue when you sneeze or cough. * Wash your hands frequently with soap and water. * You can return to work or school after the fever is gone and you feel well enough to participate in normal activities.  EXPECTED COURSE: * Fever 2-3 days * Nasal discharge 7-14 days * Cough 2-3 weeks. CALL BACK IF: * Fever lasts over 3 days * Runny nose lasts over 10 days * You become short of breath * You become worse

## 2017-12-22 IMAGING — CT CT ABD-PELV W/ CM
2 of 4 series · 11 of 46 positions shown, 12 images · IV contrast (Iodine)
Comparison: June 29, 2007

CLINICAL DATA: Abdominal pain with nausea and vomiting

EXAM:
CT ABDOMEN AND PELVIS WITH CONTRAST
TECHNIQUE: Multidetector CT imaging of the abdomen and pelvis was performed
using the standard protocol following bolus administration of
intravenous contrast.
CONTRAST:  100mL 55ZILQ-CQQ IOPAMIDOL (55ZILQ-CQQ) INJECTION 61%

[Series 201: routine, idose (2) · axial · 0.76mm/px · z∈[+62,+427]mm · 8 of 87 slices shown, 9 images]
[im 7/87  soft-tissue]
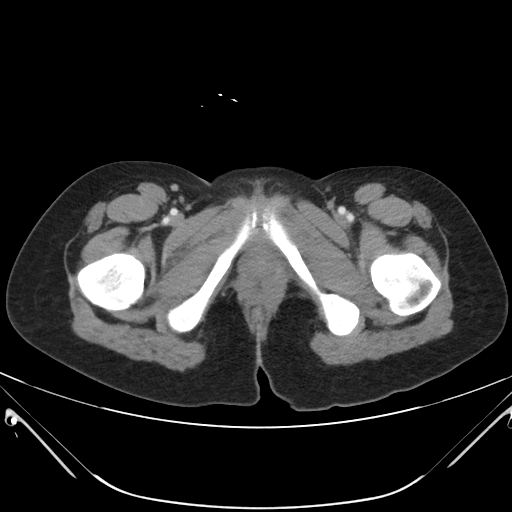
[im 7/87  bone]
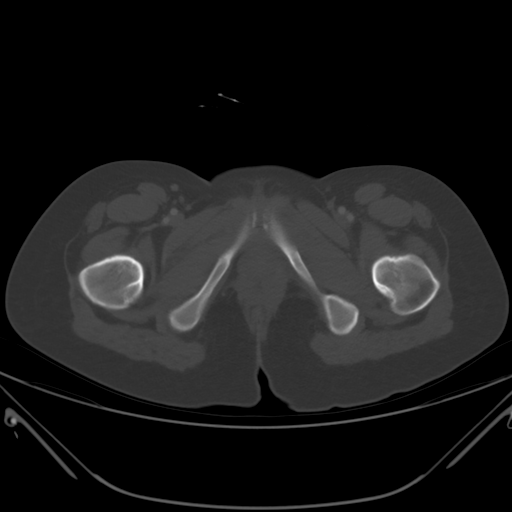
[im 18/87  soft-tissue]
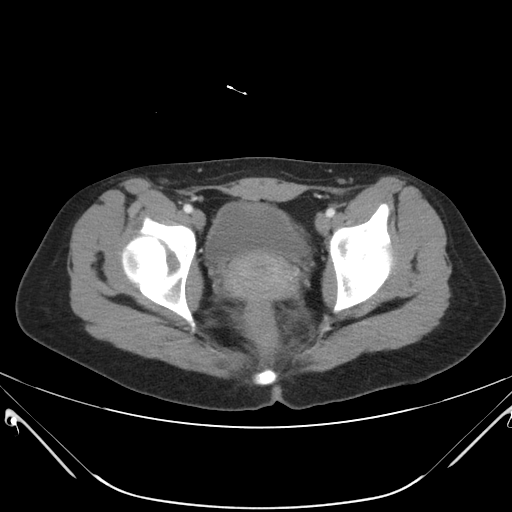
[im 28/87  soft-tissue]
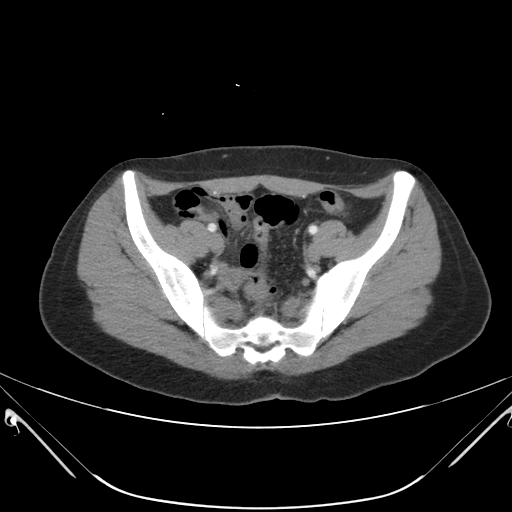
[im 38/87  soft-tissue]
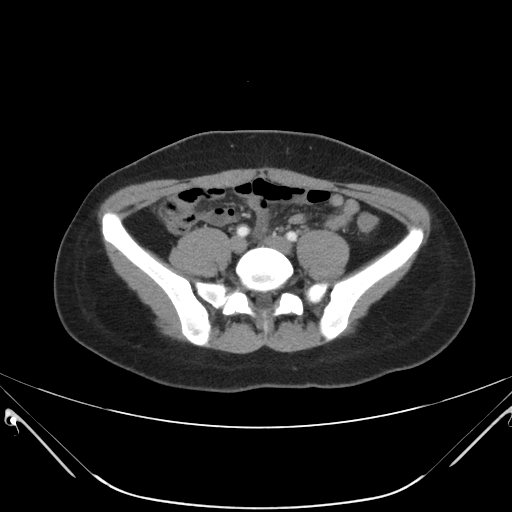
[im 49/87  soft-tissue]
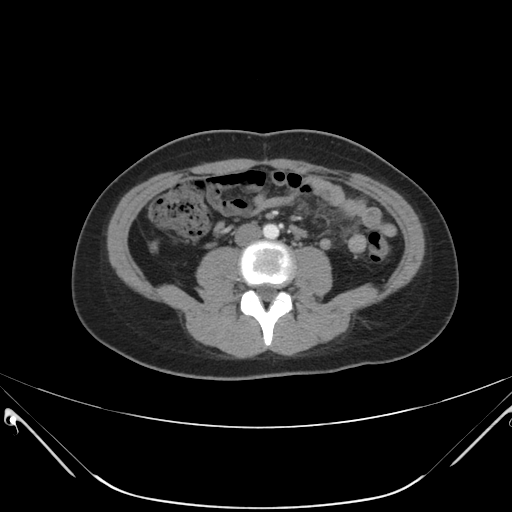
[im 59/87  soft-tissue]
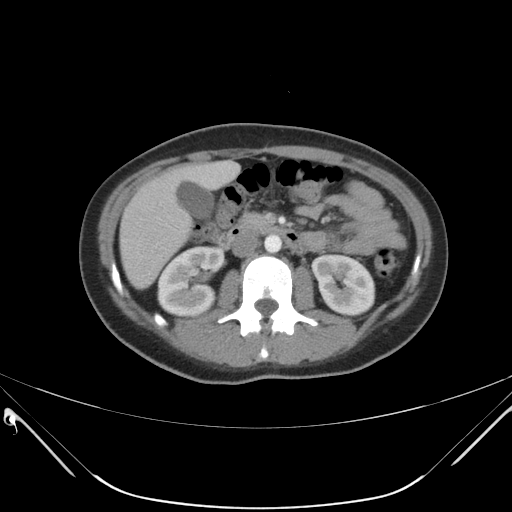
[im 69/87  soft-tissue]
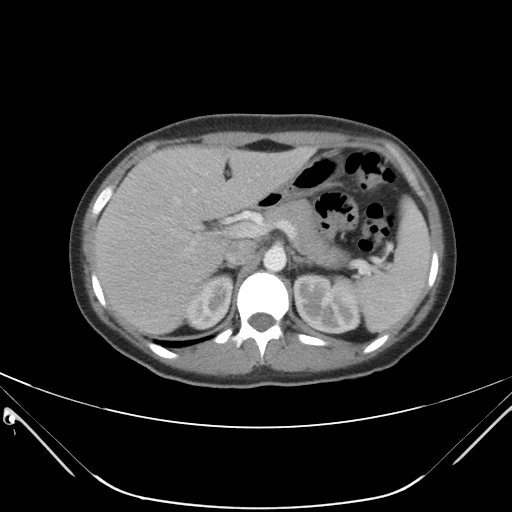
[im 80/87  soft-tissue]
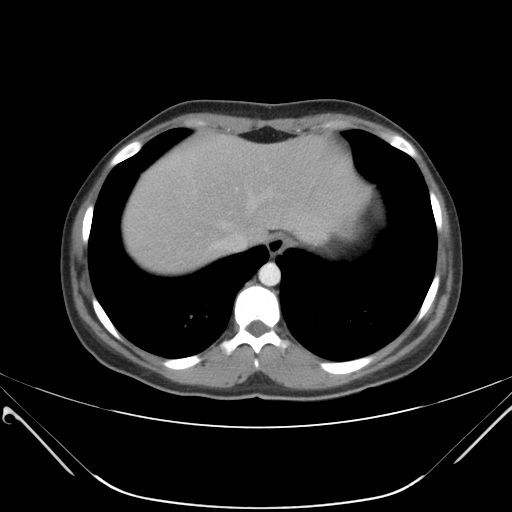

[Series 203: coronals, idose (2) · coronal · 0.45mm/px · 3 of 124 slices shown]
[im 42/124  soft-tissue]
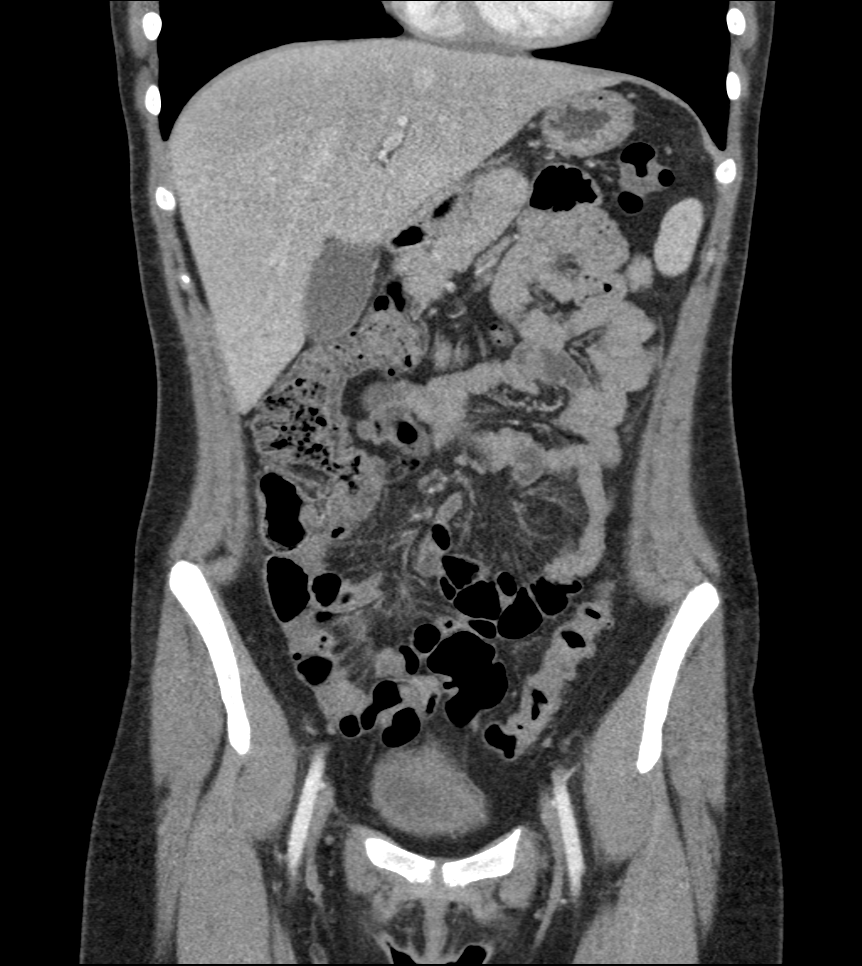
[im 55/124  soft-tissue]
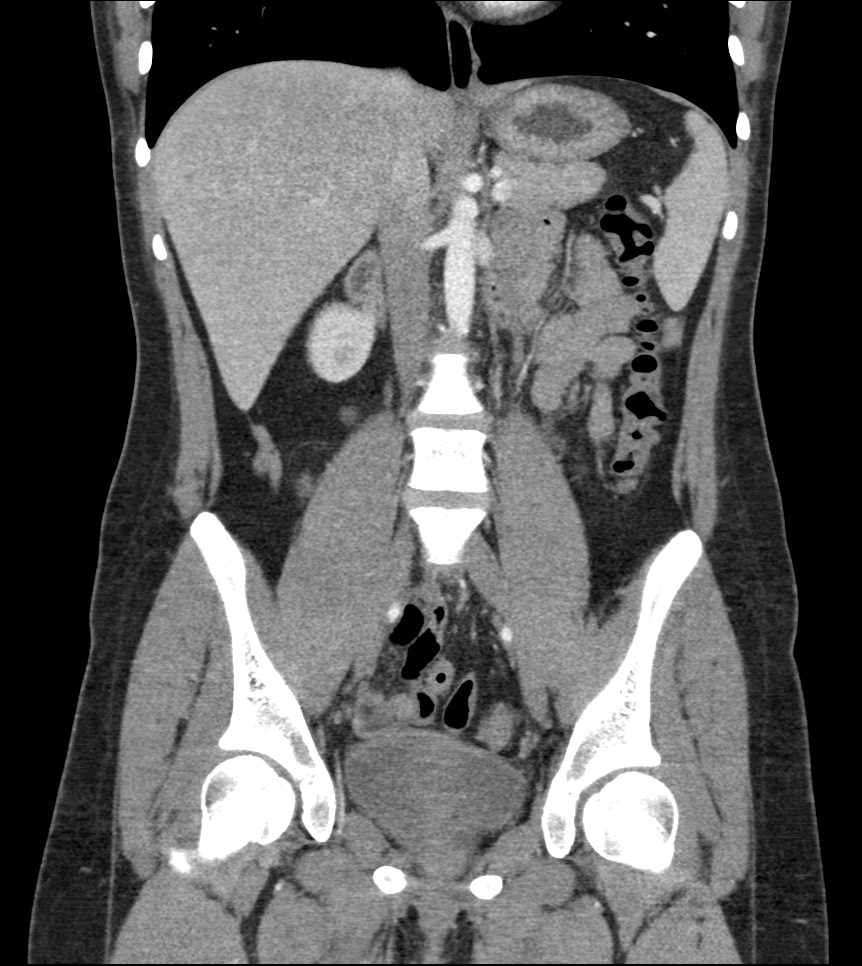
[im 69/124  soft-tissue]
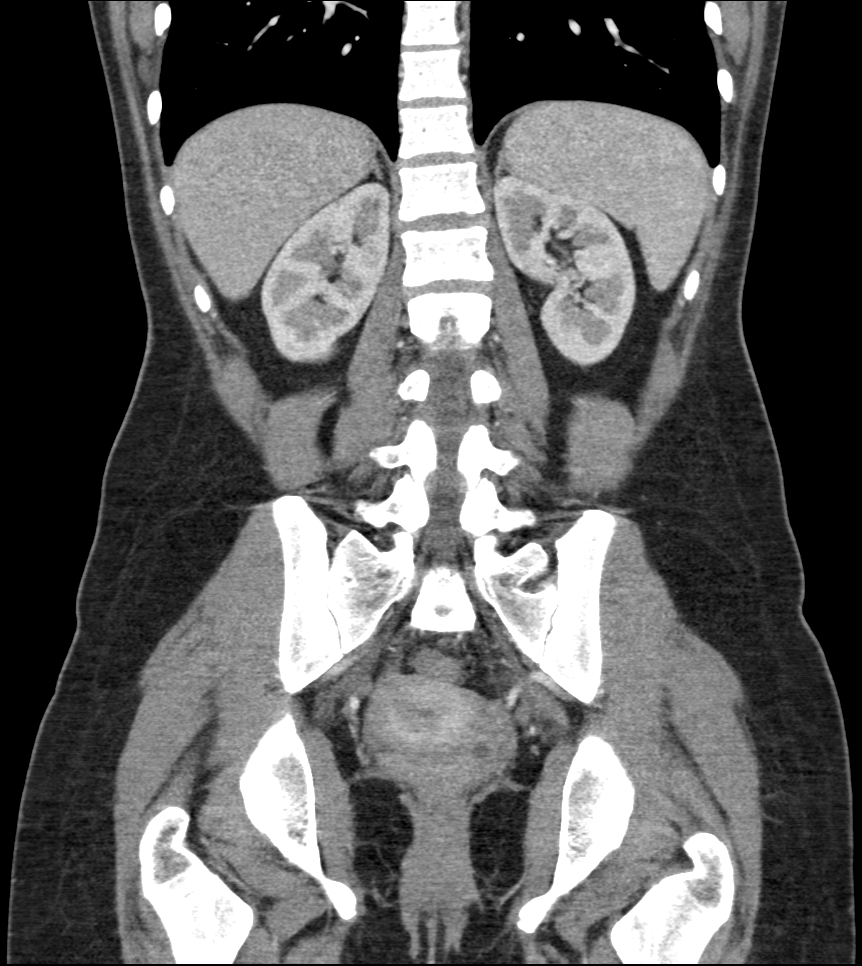

[11 of 46 positions shown; findings below may reference images not displayed]

FINDINGS: Lower chest: Lung bases are clear.

Hepatobiliary: No focal liver lesions are evident. Gallbladder wall
is not appreciably thickened. There is no biliary duct dilatation.

Pancreas: No pancreatic mass or inflammatory focus.

Spleen: No splenic lesions are evident.

Adrenals/Urinary Tract: Adrenals appear normal bilaterally. Kidneys
bilaterally show no demonstrable mass or hydronephrosis on either
side. There is no renal or ureteral calculus on either side. Urinary
bladder is midline with wall thickness within normal limits.

Stomach/Bowel: There is no appreciable bowel wall or mesenteric
thickening. There are scattered left-sided colonic diverticula
without diverticulitis. There is no bowel obstruction. No evident
free air or portal venous air. Note that there is stranding in the
fat surrounding the upper rectum region without appreciable wall
thickening in this area. No similar stranding of fat seen elsewhere.

Vascular/Lymphatic: There is no abdominal aortic aneurysm. No
vascular lesions are evident on this study. There is no adenopathy
in the abdomen or pelvis.

Reproductive: Uterus is retroverted. Right ovary appears absent.
There is no pelvic mass or pelvic fluid collection. Appendix appears
unremarkable. There is no ascites or abscess in the abdomen or
pelvis.

Other: Appendix appears normal. There is no ascites or abscess in
the abdomen or pelvis.

Musculoskeletal: No blastic or lytic bone lesions. No intramuscular
or abdominal wall lesion.
IMPRESSION: There is stranding in the fat surrounding the upper rectum on both
sides without appreciable rectal wall thickening. This finding
potentially may be represent a degree of proctitis. Similar
stranding of fat elsewhere is not evident. There is no demonstrable
bowel obstruction. No omental lesions are evident.

No abscess. Appendix appears normal. Apparent absence of right
ovary.

No renal or ureteral calculus. No hydronephrosis. Uterus
retroverted.

## 2017-12-23 ENCOUNTER — Ambulatory Visit: Payer: No Typology Code available for payment source | Admitting: Licensed Clinical Social Worker

## 2017-12-23 DIAGNOSIS — F411 Generalized anxiety disorder: Secondary | ICD-10-CM

## 2017-12-23 DIAGNOSIS — F331 Major depressive disorder, recurrent, moderate: Secondary | ICD-10-CM

## 2017-12-24 ENCOUNTER — Encounter: Payer: Self-pay | Admitting: Psychiatry

## 2017-12-24 ENCOUNTER — Ambulatory Visit: Payer: No Typology Code available for payment source | Admitting: Psychiatry

## 2017-12-24 ENCOUNTER — Other Ambulatory Visit: Payer: Self-pay

## 2017-12-24 VITALS — BP 102/69 | HR 79 | Temp 97.6°F | Wt 141.6 lb

## 2017-12-24 DIAGNOSIS — F331 Major depressive disorder, recurrent, moderate: Secondary | ICD-10-CM

## 2017-12-24 DIAGNOSIS — F411 Generalized anxiety disorder: Secondary | ICD-10-CM | POA: Diagnosis not present

## 2017-12-24 NOTE — Progress Notes (Signed)
Psychiatric progress note  Patient Identification: Victoria Simmons MRN:  [id number] Date of Evaluation:  12/24/2017 Referral Source: Dr.Aaron Chief Complaint: feeling better  Chief Complaint    Follow-up; Medication Refill     Visit Diagnosis:    ICD-10-CM   1. Moderate episode of recurrent major depressive disorder (HCC) F33.1   2. GAD (generalized anxiety disorder) F41.1     History of Present Illness: Patient was seen today for follow-up of depression and anxiety. Patient today reports that she has been doing much better as compared to last month. States that she is sleeping and eating much better. She has some anxiety related to her work but overall doing much better. Her sister's Sweet 45 Birthday party is coming up and she is quite excited about that. Reports she went to the [facility] but did not get her license. She will be going back in a few weeks now. She developed a pink eyes from her work and has some discomfort but overall doing well. Denies any suicidal thoughts.  Past Psychiatric History: Saw a psychologist twice earlier in the year.  Previous Psychotropic Medications: Yes , zoloft- stopped working at 50mg  , tried Prozac couple years ago but did not help.   Substance Abuse History in the last 12 months:  No.  Consequences of Substance Abuse: Negative  Past Medical History:  Past Medical History:  Diagnosis Date  . Anxiety   . Depression   . Dysmenorrhea   . Ovarian torsion 2008    Past Surgical History:  Procedure Laterality Date  . OVARY SURGERY  06/30/2007   dx laparoscopy, reduction of right adnexal torsion, bilateral oophoropexy of each ovary to posterior uterine fundus    Family Psychiatric History: Mother has anxiety  Family History:  Family History  Problem Relation Age of Onset  . Hepatitis B Mother   . Anxiety disorder Mother   . Hypertension Father   . ADD / ADHD Brother   . Ovarian cancer Paternal Grandmother     Social History:   Social  History   Socioeconomic History  . Marital status: Single    Spouse name: None  . Number of children: 0  . Years of education: None  . Highest education level: Some college, no degree  Social Needs  . Financial resource strain: Not hard at all  . Food insecurity - worry: Never true  . Food insecurity - inability: Never true  . Transportation needs - medical: No  . Transportation needs - non-medical: No  Occupational History  . Occupation: [employer]    Comment: full time  Tobacco Use  . Smoking status: Never Smoker  . Smokeless tobacco: Never Used  Substance and Sexual Activity  . Alcohol use: No  . Drug use: No  . Sexual activity: No    Birth control/protection: Pill    Comment: sprintec  Other Topics Concern  . None  Social History Narrative   ** Merged History Encounter **        Additional Social History: Lives with biological parents and 73 yo brother and 32 yo sister.  Allergies:  No Known Allergies  Metabolic Disorder Labs: Lab Results  Component Value Date   HGBA1C 4.9 05/14/2017   MPG 94 05/14/2017   No results found for: PROLACTIN Lab Results  Component Value Date   CHOL 174 11/21/2017   TRIG 128 11/21/2017   HDL 46 11/21/2017   CHOLHDL 3.8 11/21/2017   VLDL 30 05/14/2017   LDLCALC 102 (H) 11/21/2017  LDLCALC 163 (H) 05/14/2017     Current Medications: Current Outpatient Medications  Medication Sig Dispense Refill  . etonogestrel-ethinyl estradiol (NUVARING) 0.12-0.015 MG/24HR vaginal ring Place 1 each vaginally every 28 (twenty-eight) days. Use the ring continuously. 3 each 0  . fluvoxaMINE (LUVOX) 100 MG tablet Take 1 tablet (100 mg total) by mouth at bedtime. 30 tablet 2  . ibuprofen (ADVIL,MOTRIN) 800 MG tablet Take 1 tablet (800 mg total) by mouth every 8 (eight) hours as needed. 30 tablet 2  . lamoTRIgine (LAMICTAL) 100 MG tablet Take 1 tablet by mouth daily at night. 30 tablet 2  . traZODone (DESYREL) 100 MG tablet Take 1 tablet  (100 mg total) by mouth at bedtime. 30 tablet 1   No current facility-administered medications for this visit.     Neurologic: Headache: No Seizure: No Paresthesias:No  Musculoskeletal: Strength & Muscle Tone: within normal limits Gait & Station: normal Patient leans: N/A  Psychiatric Specialty Exam: ROS  Blood pressure 102/69, pulse 79, temperature 97.6 F (36.4 C), temperature source Oral, weight 64.2 kg (141 lb 9.6 oz).Body mass index is 23.21 kg/m.  General Appearance: Casual  Eye Contact:  Fair  Speech:  Clear and Coherent  Volume:  Normal  Mood:  Much improved  Affect:  Pleasant   Thought Process:  Coherent  Orientation:  Full (Time, Place, and Person)  Thought Content:  WDL and Logical  Suicidal Thoughts: None   Homicidal Thoughts:  No  Memory:  Immediate;   Fair Recent;   Fair Remote;   Fair  Judgement:  Fair  Insight:  Fair  Psychomotor Activity:  Normal  Concentration:  Concentration: Fair and Attention Span: Fair  Recall:  [employer] of Knowledge:Fair  Language: Fair  Akathisia:  No  Handed:  Right  AIMS (if indicated):  na  Assets:  Communication Skills Desire for Improvement Financial Resources/Insurance Physical Health Social Support Vocational/Educational  ADL's:  Intact  Cognition: WNL  Sleep:  good    Treatment Plan Summary:  Generalized anxiety disorder Continue Luvox at 100 mg once daily. Continue therapy with Royal Piedra  Bipolar 2 disorder Continue Lamictal at 50mg  po bid   Return to clinic in 2 months time or call before if needed.   Elvin So, MD 3/19/20199:35 AM

## 2017-12-30 NOTE — Progress Notes (Signed)
  THERAPIST PROGRESS NOTE   Date of Service:   11/25/2017  Session Time:   1 hour  Patient:   Victoria Simmons   DOB:   [birth year]  MR Number:  [id number]  [facility] [place] [address] Dept: [phone]            Provider/Observer:  Lubertha South Counselor  Risk of Suicide/Violence: low  Diagnosis:    Moderate episode of recurrent major depressive disorder (HCC)  GAD (generalized anxiety disorder)  Type of Therapy: Individual Therapy  Treatment Goals addressed: Anxiety and Coping  Participation Level: Active   Interventions: CBT and Motivational Interviewing   Behavioral Response: CasualAlertEuthymic   Summary: Patient reports positive mood.  Patient  Denies any negative symptoms.  Patient reports that she gets overwhelmed at work about 2 days out of the week.  Therapist educated Patient on her diagnosis and the symptoms that she displays. Therapist questioned Patient about her perspective on whether or not she's made progress on her treatment goals. Therapist facilitated a therapeutic activity with Patient to assist her with recognizing and identifying her negative thinking patterns and to assist with increasing her positive thinking skills. Therapist reviewed, explained and summarized with Patient all objectives learned and asked questions for clarification. Therapist reviewed and educated Patient on tools and coping skills to assist Patient with working towards achieving her treatment goals.   Plan: Victoria Simmons will continue to use coping skills to reduce symptoms.   Return again in 2 weeks.

## 2018-01-08 MED FILL — FLUVOXAMINE MALEATE 100 MG: 100 | 30 days supply | Qty: 30 | Fill #1

## 2018-01-08 MED FILL — traZODone HCL 100 MG TABS: 100 | 30 days supply | Qty: 30 | Fill #1

## 2018-01-08 MED FILL — lamoTRIgine 100 MG TABS: 100 | 30 days supply | Qty: 30 | Fill #1

## 2018-01-14 ENCOUNTER — Telehealth: Payer: Self-pay | Admitting: Obstetrics and Gynecology

## 2018-01-14 MED ORDER — ETONOGESTREL-ETHINYL ESTRADIOL 0.12-0.015 MG/24HR VA RING: 1.0000 | VAGINAL_RING | VAGINAL | 0 refills | Status: DC

## 2018-01-14 NOTE — Telephone Encounter (Signed)
Patient's mom Victoria Simmons is calling regarding her daughters Nuvoring. Dawn said her daughter's Nuvoring fell twice and she will need a new Nuvoring. Patient has a recheck appointment with Dr.Silva 02/13/18 and Dawn thinks her daughter should be seen sooner. DPR on file to talk with mom.

## 2018-01-14 NOTE — Telephone Encounter (Signed)
Spoke with patient's mother [name], okay per ROI. Mother states that when the patient when to place her second Nuvaring it fell out and patient did not notice. Used 3rd ring from 3 month supply. Needs 1 more ring since she threw away second ring. 1 Nuvaring sent to pharmacy on file. Dawn is asking to move patient's follow up appointment forward as the patient will be going out of town at the end of April -  First of May. Appointment moved up to 4/22 at 10:30 am with Dr.Silva.   Routing to provider for final review. Patient agreeable to disposition. Will close encounter.

## 2018-01-16 NOTE — Progress Notes (Signed)
  THERAPIST PROGRESS NOTE   Date of Service:   12/23/2017  Session Time:   1 hour  Patient:   Victoria Simmons   DOB:   1996/02/24  MR Number:  [id number]  [facility]  [place] [facility] [place] [address] Dept: [phone]            Provider/Observer:  Lubertha South Counselor  Risk of Suicide/Violence: low   Diagnosis:    Moderate episode of recurrent major depressive disorder (HCC)  GAD (generalized anxiety disorder)  Type of Therapy: Individual Therapy  Treatment Goals addressed: Coping  Participation Level: Active    Behavioral Observation: Victoria Simmons  presents as a 22 y.o.-year-old  Interventions: CBT and Motivational Interviewing   Behavioral Response: CasualAlertEuthymic   Summary: Therapist met with Patient in an outpatient setting to assess current mood and assist with making progress towards goals through the use of therapeutic intervention. Therapist did a brief mood check, assessing anger, fear, disgust, excitement, happiness, and sadness.  Patient reports being anxious at work a lot lately.  She reports feeling overwhelmed and increase in anxiety.  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   Return again in 2 weeks.

## 2018-01-27 ENCOUNTER — Encounter: Payer: Self-pay | Admitting: Obstetrics and Gynecology

## 2018-01-27 ENCOUNTER — Ambulatory Visit (INDEPENDENT_AMBULATORY_CARE_PROVIDER_SITE_OTHER): Payer: No Typology Code available for payment source | Admitting: Obstetrics and Gynecology

## 2018-01-27 ENCOUNTER — Other Ambulatory Visit: Payer: Self-pay

## 2018-01-27 ENCOUNTER — Ambulatory Visit: Payer: No Typology Code available for payment source | Admitting: Licensed Clinical Social Worker

## 2018-01-27 VITALS — BP 98/56 | HR 66 | Resp 12 | Ht 65.0 in | Wt 140.4 lb

## 2018-01-27 DIAGNOSIS — Z3009 Encounter for other general counseling and advice on contraception: Secondary | ICD-10-CM | POA: Diagnosis not present

## 2018-01-27 DIAGNOSIS — F331 Major depressive disorder, recurrent, moderate: Secondary | ICD-10-CM

## 2018-01-27 NOTE — Progress Notes (Signed)
GYNECOLOGY  VISIT   HPI: 22 y.o.   Single  Caucasian  female   No obstetric history on file. with Patient's last menstrual period was 01/19/2018.   here for   nuvaring follow up. Mother present for the entire visit today.   Switched from OCPs to NuvaRing for RLQ pain, nausea and vomiting during menses.   Had upper abdominal pain and nausea with NuvaRing use.  Did well for the first month, but then developed these symptoms with the second ring.  She got better immediately after the NuvaRing was removed.   Normal pelvic US on 12/05/17.   Lactose intolerance.   Had colonoscopy and endoscopy in 2017.  Normal results.  GYNECOLOGIC HISTORY: Patient's last menstrual period was 01/19/2018. Contraception: NuvaRing Menopausal hormone therapy:  n/a Last mammogram:  n/a Last pap smear:   11/21/17 Pap smear negative        OB History    Gravida  0   Para  0   Term  0   Preterm  0   AB  0   Living        SAB  0   TAB  0   Ectopic  0   Multiple      Live Births                 Patient Active Problem List   Diagnosis Date Noted  . Acute non-recurrent frontal sinusitis 11/03/2017  . Well woman exam 06/19/2017  . MDD (major depressive disorder) 06/19/2017  . HLD (hyperlipidemia) 06/19/2017  . Bipolar 2 disorder, major depressive episode ([place]) 05/13/2017  . Generalized anxiety disorder 05/13/2017  . Menorrhagia 01/25/2014  . Anxiety state 01/26/2013  . Family history of hypercholesterolemia 01/06/2013  . Murmur 01/06/2013    Past Medical History:  Diagnosis Date  . Anxiety   . Depression   . Dysmenorrhea   . Ovarian torsion 2008    Past Surgical History:  Procedure Laterality Date  . OVARY SURGERY  06/30/2007   dx laparoscopy, reduction of right adnexal torsion, bilateral oophoropexy of each ovary to posterior uterine fundus    Current Outpatient Medications  Medication Sig Dispense Refill  . fluvoxaMINE (LUVOX) 100 MG tablet Take 1 tablet (100 mg  total) by mouth at bedtime. 30 tablet 2  . ibuprofen (ADVIL,MOTRIN) 800 MG tablet Take 1 tablet (800 mg total) by mouth every 8 (eight) hours as needed. 30 tablet 2  . lamoTRIgine (LAMICTAL) 100 MG tablet Take 1 tablet by mouth daily at night. 30 tablet 2  . traZODone (DESYREL) 100 MG tablet Take 1 tablet (100 mg total) by mouth at bedtime. 30 tablet 1  . etonogestrel-ethinyl estradiol (NUVARING) 0.12-0.015 MG/24HR vaginal ring Place 1 each vaginally every 28 (twenty-eight) days. Place 1 each vaginally every 28 days. Use the ring continuously. (Patient not taking: Reported on 01/27/2018) 1 each 0   No current facility-administered medications for this visit.      ALLERGIES: Patient has no known allergies.  Family History  Problem Relation Age of Onset  . Hepatitis B Mother   . Anxiety disorder Mother   . Hypertension Father   . ADD / ADHD Brother   . Ovarian cancer Paternal Grandmother     Social History   Socioeconomic History  . Marital status: Single    Spouse name: Not on file  . Number of children: 0  . Years of education: Not on file  . Highest education level: Some college, no degree  Occupational  History  . 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 Financial resource strain: Not hard at all  . Food insecurity:    Worry: Never true    Inability: Never true  . Transportation needs:    Medical: No    Non-medical: No  Tobacco Use  . Smoking status: Never Smoker  . Smokeless tobacco: Never Used  Substance and Sexual Activity  . Alcohol use: No  . Drug use: No  . Sexual activity: Never    Birth control/protection: Pill    Comment: sprintec  Lifestyle  . Physical activity:    Days per week: 5 days    Minutes per session: 60 min  . Stress: Not at all  Relationships  . Social connections:    Talks on phone: Three times a week    Gets together: More than three times a week    Attends religious service: Never    Active member of club  or organization: No    Attends meetings of clubs or organizations: Never    Relationship status: Never married  . Intimate partner violence:    Fear of current or ex partner: No    Emotionally abused: No    Physically abused: No    Forced sexual activity: No  Other Topics Concern  . Not on file  Social History Narrative   ** Merged History Encounter **        ROS:  Pertinent items are noted in HPI.  PHYSICAL EXAMINATION:    BP (!) 98/56 (BP Location: Right Arm, Patient Position: Sitting, Cuff Size: Normal)   Pulse 66   Resp 12   Ht 5\' 5"  (1.651 m)   Wt 140 lb 6.4 oz (63.7 kg)   LMP 01/19/2018   BMI 23.36 kg/m     General appearance: alert, cooperative and appears stated age   ASSESSMENT  RLQ pain with menses.  Intolerant of COCs and NuvaRing. On Lamictal.   PLAN  We discussed alternatives of POPs, Depo Provera, Nexplanon, and progesterone IUDs. She interested in a Buckley IUD.  Risks and benefits reviewed. Brochure given. Will plan for Cytotec 200 mcg the evening prior to and the am of insertion.  Will also plan for paracervical block.  Return for IUD with next menses.   An After Visit Summary was printed and given to the patient.  _25_____ minutes face to face time of which over 50% was spent in counseling.

## 2018-01-28 ENCOUNTER — Telehealth: Payer: Self-pay | Admitting: Obstetrics and Gynecology

## 2018-01-28 NOTE — Telephone Encounter (Signed)
Call placed to convey benefits. 

## 2018-02-04 ENCOUNTER — Ambulatory Visit: Payer: No Typology Code available for payment source | Admitting: Psychiatry

## 2018-02-04 NOTE — Telephone Encounter (Signed)
Call placed to convey benefits. 

## 2018-02-10 NOTE — Progress Notes (Signed)
   THERAPIST PROGRESS NOTE  Session Time: 83min  Participation Level: Active  Behavioral Response: CasualAlertEuthymic  Type of Therapy: Individual Therapy  Treatment Goals addressed: Coping  Interventions: CBT and Motivational Interviewing  Summary: Victoria Simmons is a 22 y.o. female who presents with a reduction of symptoms.   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 Therapist encouraged Patient to focus on her strengths and the positive aspects of her life versus the opposite.  Therapist assisted Patient with this task by guiding her through a short list of tasks that she is wants to do. Therapist praised Patient for her effort and encouraged her to go forward in pointing out the positive things about self.   Suicidal/Homicidal: No   Plan: Return again in 2 weeks.  Diagnosis: Axis I: Depression    Axis II: No diagnosis    Lubertha South, LCSW 01/27/2018

## 2018-02-13 ENCOUNTER — Ambulatory Visit: Payer: No Typology Code available for payment source | Admitting: Obstetrics and Gynecology

## 2018-02-13 MED FILL — FLUVOXAMINE MALEATE 100 MG: 100 | 30 days supply | Qty: 30 | Fill #2

## 2018-02-13 MED FILL — lamoTRIgine 100 MG TABS: 100 | 30 days supply | Qty: 30 | Fill #2

## 2018-02-18 NOTE — Telephone Encounter (Signed)
Call placed to convey benefits. Unable to leave message mailbox is full. °

## 2018-02-20 ENCOUNTER — Other Ambulatory Visit: Payer: Self-pay | Admitting: Psychiatry

## 2018-02-20 ENCOUNTER — Telehealth: Payer: Self-pay | Admitting: Obstetrics and Gynecology

## 2018-02-20 MED ORDER — MISOPROSTOL 200 MCG PO TABS
ORAL_TABLET | ORAL | 0 refills | Status: DC
Start: 2018-02-20 — End: 2018-02-21

## 2018-02-20 MED ORDER — TRAZODONE HCL 100 MG PO TABS: 100.0000 mg | ORAL_TABLET | Freq: Every day | ORAL | 1 refills | Status: DC

## 2018-02-20 MED FILL — traZODone HCL 100 MG TABS: 100 | 30 days supply | Qty: 30 | Fill #0

## 2018-02-20 MED FILL — miSOPROStol 200 MCG TABS: 200 | 2 days supply | Qty: 2 | Fill #0

## 2018-02-20 NOTE — Telephone Encounter (Signed)
Spoke with patient's mom Dawn, okay per ROI. Dawn states the patient started her menses today and needs to schedule Kyleena insertion. Appointment scheduled for tomorrow 5/17 at 2:30 pm with Dr.Silva. Pre procedure instructions given.  Motrin instructions given. Motrin=Advil=Ibuprofen, 800 mg one hour before appointment. Eat a meal and hydrate well before appointment. Cytotec instructions give.   Place 1 tablet PV night before the procedure. Place 1 tablet PV the morning of the procedure. Rx for Cytotec 200 mcg #2 0RF sent to pharmacy on file.  Routing to provider for final review. Patient agreeable to disposition. Will close encounter.

## 2018-02-20 NOTE — Telephone Encounter (Signed)
Patient's mother calling to schedule IUD insertion. Patient started cycle today.

## 2018-02-21 ENCOUNTER — Ambulatory Visit (INDEPENDENT_AMBULATORY_CARE_PROVIDER_SITE_OTHER): Payer: No Typology Code available for payment source | Admitting: Obstetrics and Gynecology

## 2018-02-21 ENCOUNTER — Encounter: Payer: Self-pay | Admitting: Obstetrics and Gynecology

## 2018-02-21 ENCOUNTER — Other Ambulatory Visit: Payer: Self-pay

## 2018-02-21 VITALS — BP 100/60 | HR 80 | Resp 14 | Ht 65.0 in | Wt 145.0 lb

## 2018-02-21 DIAGNOSIS — Z3009 Encounter for other general counseling and advice on contraception: Secondary | ICD-10-CM

## 2018-02-21 DIAGNOSIS — Z30430 Encounter for insertion of intrauterine contraceptive device: Secondary | ICD-10-CM

## 2018-02-21 LAB — POCT URINE PREGNANCY: Preg Test, Ur: NEGATIVE

## 2018-02-21 NOTE — Patient Instructions (Signed)
Intrauterine Device Insertion, Care After °This sheet gives you information about how to care for yourself after your procedure. Your health care provider may also give you more specific instructions. If you have problems or questions, contact your health care provider. °What can I expect after the procedure? °After the procedure, it is common to have: °· Cramps and pain in the abdomen. °· Light bleeding (spotting) or heavier bleeding that is like your menstrual period. This may last for up to a few days. °· Lower back pain. °· Dizziness. °· Headaches. °· Nausea. ° °Follow these instructions at home: °· Before resuming sexual activity, check to make sure that you can feel the IUD string(s). You should be able to feel the end of the string(s) below the opening of your cervix. If your IUD string is in place, you may resume sexual activity. °? If you had a hormonal IUD inserted more than 7 days after your most recent period started, you will need to use a backup method of birth control for 7 days after IUD insertion. Ask your health care provider whether this applies to you. °· Continue to check that the IUD is still in place by feeling for the string(s) after every menstrual period, or once a month. °· Take over-the-counter and prescription medicines only as told by your health care provider. °· Do not drive or use heavy machinery while taking prescription pain medicine. °· Keep all follow-up visits as told by your health care provider. This is important. °Contact a health care provider if: °· You have bleeding that is heavier or lasts longer than a normal menstrual cycle. °· You have a fever. °· You have cramps or abdominal pain that get worse or do not get better with medicine. °· You develop abdominal pain that is new or is not in the same area of earlier cramping and pain. °· You feel lightheaded or weak. °· You have abnormal or bad-smelling discharge from your vagina. °· You have pain during sexual  activity. °· You have any of the following problems with your IUD string(s): °? The string bothers or hurts you or your sexual partner. °? You cannot feel the string. °? The string has gotten longer. °· You can feel the IUD in your vagina. °· You think you may be pregnant, or you miss your menstrual period. °· You think you may have an STI (sexually transmitted infection). °Get help right away if: °· You have flu-like symptoms. °· You have a fever and chills. °· You can feel that your IUD has slipped out of place. °Summary °· After the procedure, it is common to have cramps and pain in the abdomen. It is also common to have light bleeding (spotting) or heavier bleeding that is like your menstrual period. °· Continue to check that the IUD is still in place by feeling for the string(s) after every menstrual period, or once a month. °· Keep all follow-up visits as told by your health care provider. This is important. °· Contact your health care provider if you have problems with your IUD string(s), such as the string getting longer or bothering you or your sexual partner. °This information is not intended to replace advice given to you by your health care provider. Make sure you discuss any questions you have with your health care provider. °Document Released: 05/23/2011 Document Revised: 08/15/2016 Document Reviewed: 08/15/2016 °Elsevier Interactive Patient Education © 2017 [employer] Inc[employer] ° °

## 2018-02-21 NOTE — Progress Notes (Signed)
GYNECOLOGY  VISIT   HPI: 22 y.o.   Single  Caucasian  female   [id number] with Patient's last menstrual period was 02/20/2018.   here for [facility] insertion.  Has pelvic pain during menses.  Normal pelvic ultrasound on 12/05/17.  Took Cytotec last hs and this am.  Took Motrin 800 mg.     UPT negative.   GYNECOLOGIC HISTORY: Patient's last menstrual period was 02/20/2018. Contraception:  Abstinence Menopausal hormone therapy:  n/a Last mammogram:  n/a Last pap smear:   11/21/17 Pap smear negative        OB History    Gravida  0   Para  0   Term  0   Preterm  0   AB  0   Living  0     SAB  0   TAB  0   Ectopic  0   Multiple  0   Live Births  0              Patient Active Problem List   Diagnosis Date Noted  . Acute non-recurrent frontal sinusitis 11/03/2017  . Well woman exam 06/19/2017  . MDD (major depressive disorder) 06/19/2017  . HLD (hyperlipidemia) 06/19/2017  . Bipolar 2 disorder, major depressive episode ([place]) 05/13/2017  . Generalized anxiety disorder 05/13/2017  . Menorrhagia 01/25/2014  . Anxiety state 01/26/2013  . Family history of hypercholesterolemia 01/06/2013  . Murmur 01/06/2013    Past Medical History:  Diagnosis Date  . Anxiety   . Depression   . Dysmenorrhea   . Ovarian torsion 2008    Past Surgical History:  Procedure Laterality Date  . OVARY SURGERY  06/30/2007   dx laparoscopy, reduction of right adnexal torsion, bilateral oophoropexy of each ovary to posterior uterine fundus    Current Outpatient Medications  Medication Sig Dispense Refill  . fluvoxaMINE (LUVOX) 100 MG tablet Take 1 tablet (100 mg total) by mouth at bedtime. 30 tablet 2  . ibuprofen (ADVIL,MOTRIN) 800 MG tablet Take 1 tablet (800 mg total) by mouth every 8 (eight) hours as needed. 30 tablet 2  . lamoTRIgine (LAMICTAL) 100 MG tablet Take 1 tablet by mouth daily at night. 30 tablet 2  . traZODone (DESYREL) 100 MG tablet Take 1 tablet (100 mg total) by  mouth at bedtime. 30 tablet 1   No current facility-administered medications for this visit.      ALLERGIES: Patient has no known allergies.  Family History  Problem Relation Age of Onset  . Hepatitis B Mother   . Anxiety disorder Mother   . Hypertension Father   . ADD / ADHD Brother   . Ovarian cancer Paternal Grandmother     Social History   Socioeconomic History  . Marital status: Single    Spouse name: Not on file  . Number of children: 0  . Years of education: Not on file  . Highest education level: Some college, no degree  Occupational History  . 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 Financial resource strain: Not hard at all  . Food insecurity:    Worry: Never true    Inability: Never true  . Transportation needs:    Medical: No    Non-medical: No  Tobacco Use  . Smoking status: Never Smoker  . Smokeless tobacco: Never Used  Substance and Sexual Activity  . Alcohol use: No  . Drug use: No  . Sexual activity: Never    Birth control/protection:  Pill    Comment: sprintec  Lifestyle  . Physical activity:    Days per week: 5 days    Minutes per session: 60 min  . Stress: Not at all  Relationships  . Social connections:    Talks on phone: Three times a week    Gets together: More than three times a week    Attends religious service: Never    Active member of club or organization: No    Attends meetings of clubs or organizations: Never    Relationship status: Never married  . Intimate partner violence:    Fear of current or ex partner: No    Emotionally abused: No    Physically abused: No    Forced sexual activity: No  Other Topics Concern  . Not on file  Social History Narrative   ** Merged History Encounter **        Review of Systems  Constitutional: Negative.   HENT: Negative.   Eyes: Negative.   Respiratory: Negative.   Cardiovascular: Negative.   Gastrointestinal: Negative.   Endocrine: Negative.    Genitourinary: Negative.   Musculoskeletal: Negative.   Skin: Negative.   Allergic/Immunologic: Negative.   Neurological: Negative.   Hematological: Negative.   Psychiatric/Behavioral: Negative.     PHYSICAL EXAMINATION:    BP 100/60 (BP Location: Right Arm, Patient Position: Sitting, Cuff Size: Normal)   Pulse 80   Resp 14   Ht 5\' 5"  (1.651 m)   Wt 145 lb (65.8 kg)   LMP 02/20/2018   BMI 24.13 kg/m     General appearance: alert, cooperative and appears stated age   Pelvic: External genitalia:  no lesions              Urethra:  normal appearing urethra with no masses, tenderness or lesions              Bartholins and Skenes: normal                 Vagina: normal appearing vagina with normal color and discharge, no lesions              Cervix: no lesions                Bimanual Exam:  Uterus:  normal size, contour, position, consistency, mobility, non-tender              Adnexa: no mass, fullness, tenderness         Kyleena IUD insertion - Kyleena IUD lot number [id number], exp Feb. 2021.  Consent for procedure.  Sterile prep of cervix with Hibiclens.  Paracervical block - 1% lidocaine 9 cc, lot number [id number], exp 01/23. Tenaculum to anterior cervical lip.  Uterus sounded to just over 6 cm.  IUD placed without difficulty. Strings trimmed and shown to patient.  Repeat BM exam - no change.  Minimal EBL.  No complications.   Chaperone was present for exam.  ASSESSMENT  Kyleena IUD insertion.   Pelvic pain with menses.  PLAN  Instructions and precautions given.  Back up protection for one week if needs pregnancy prevention.  IUD card and brochure to patient.  FU in 4 weeks.    An After Visit Summary was printed and given to the patient.

## 2018-02-25 ENCOUNTER — Other Ambulatory Visit: Payer: Self-pay

## 2018-02-25 ENCOUNTER — Encounter: Payer: Self-pay | Admitting: Psychiatry

## 2018-02-25 ENCOUNTER — Ambulatory Visit: Payer: No Typology Code available for payment source | Admitting: Psychiatry

## 2018-02-25 VITALS — BP 97/63 | HR 69 | Temp 99.2°F | Wt 147.4 lb

## 2018-02-25 DIAGNOSIS — F411 Generalized anxiety disorder: Secondary | ICD-10-CM

## 2018-02-25 DIAGNOSIS — F331 Major depressive disorder, recurrent, moderate: Secondary | ICD-10-CM | POA: Diagnosis not present

## 2018-02-25 MED ORDER — TRAZODONE HCL 100 MG PO TABS: 100.0000 mg | ORAL_TABLET | Freq: Every day | ORAL | 1 refills | Status: DC

## 2018-02-25 MED ORDER — FLUVOXAMINE MALEATE 100 MG PO TABS: 100.0000 mg | ORAL_TABLET | Freq: Every day | ORAL | 2 refills | Status: DC

## 2018-02-25 MED ORDER — LAMOTRIGINE 100 MG PO TABS: ORAL_TABLET | ORAL | 2 refills | Status: DC

## 2018-02-25 NOTE — Progress Notes (Signed)
Psychiatric progress note  Patient Identification: Victoria Simmons MRN:  [id number] Date of Evaluation:  02/25/2018 Referral Source: Dr.Aaron Chief Complaint: doing well  Chief Complaint    Follow-up; Medication Refill     Visit Diagnosis:    ICD-10-CM   1. Moderate episode of recurrent major depressive disorder (HCC) F33.1   2. GAD (generalized anxiety disorder) F41.1     History of Present Illness: Patient was seen today for follow-up of depression and anxiety. Patient today reports that she has been doing well. She is working full time. Sleeping well on trazodone. She is looking forward to some short trips throughout summer. Denies any mood symptoms or anxiety. Denies any suicidal thoughts.  Past Psychiatric History: Saw a psychologist twice earlier in the year.  Previous Psychotropic Medications: Yes , zoloft- stopped working at 50mg  , tried Prozac couple years ago but did not help.   Substance Abuse History in the last 12 months:  No.  Consequences of Substance Abuse: Negative  Past Medical History:  Past Medical History:  Diagnosis Date  . Anxiety   . Depression   . Dysmenorrhea   . Ovarian torsion 2008    Past Surgical History:  Procedure Laterality Date  . OVARY SURGERY  06/30/2007   dx laparoscopy, reduction of right adnexal torsion, bilateral oophoropexy of each ovary to posterior uterine fundus    Family Psychiatric History: Mother has anxiety  Family History:  Family History  Problem Relation Age of Onset  . Hepatitis B Mother   . Anxiety disorder Mother   . Hypertension Father   . ADD / ADHD Brother   . Ovarian cancer Paternal Grandmother     Social History:   Social History   Socioeconomic History  . Marital status: Single    Spouse name: Not on file  . Number of children: 0  . Years of education: Not on file  . Highest education level: Some college, no degree  Occupational History  . Occupation: [employer]    Comment: full time   Social Needs  . Financial resource strain: Not hard at all  . Food insecurity:    Worry: Never true    Inability: Never true  . Transportation needs:    Medical: No    Non-medical: No  Tobacco Use  . Smoking status: Never Smoker  . Smokeless tobacco: Never Used  Substance and Sexual Activity  . Alcohol use: No  . Drug use: No  . Sexual activity: Never    Birth control/protection: Pill    Comment: sprintec  Lifestyle  . Physical activity:    Days per week: 5 days    Minutes per session: 60 min  . Stress: Not at all  Relationships  . Social connections:    Talks on phone: Three times a week    Gets together: More than three times a week    Attends religious service: Never    Active member of club or organization: No    Attends meetings of clubs or organizations: Never    Relationship status: Never married  Other Topics Concern  . Not on file  Social History Narrative   ** Merged History Encounter **        Additional Social History: Lives with biological parents and 55 yo brother and 74 yo sister.  Allergies:  No Known Allergies  Metabolic Disorder Labs: Lab Results  Component Value Date   HGBA1C 4.9 05/14/2017   MPG 94 05/14/2017   No results found for:  PROLACTIN Lab Results  Component Value Date   CHOL 174 11/21/2017   TRIG 128 11/21/2017   HDL 46 11/21/2017   CHOLHDL 3.8 11/21/2017   VLDL 30 05/14/2017   LDLCALC 102 (H) 11/21/2017   LDLCALC 163 (H) 05/14/2017     Current Medications: Current Outpatient Medications  Medication Sig Dispense Refill  . fluvoxaMINE (LUVOX) 100 MG tablet Take 1 tablet (100 mg total) by mouth at bedtime. 30 tablet 2  . ibuprofen (ADVIL,MOTRIN) 800 MG tablet Take 1 tablet (800 mg total) by mouth every 8 (eight) hours as needed. 30 tablet 2  . lamoTRIgine (LAMICTAL) 100 MG tablet Take 1 tablet by mouth daily at night. 30 tablet 2  . traZODone (DESYREL) 100 MG tablet Take 1 tablet (100 mg total) by mouth at bedtime. 30  tablet 1   No current facility-administered medications for this visit.     Neurologic: Headache: No Seizure: No Paresthesias:No  Musculoskeletal: Strength & Muscle Tone: within normal limits Gait & Station: normal Patient leans: N/A  Psychiatric Specialty Exam: ROS  Blood pressure 97/63, pulse 69, temperature 99.2 F (37.3 C), temperature source Oral, weight 66.9 kg (147 lb 6.4 oz), last menstrual period 02/20/2018.Body mass index is 24.53 kg/m.  General Appearance: Casual  Eye Contact:  Fair  Speech:  Clear and Coherent  Volume:  Normal  Mood:  Much improved  Affect:  Pleasant   Thought Process:  Coherent  Orientation:  Full (Time, Place, and Person)  Thought Content:  WDL and Logical  Suicidal Thoughts: None   Homicidal Thoughts:  No  Memory:  Immediate;   Fair Recent;   Fair Remote;   Fair  Judgement:  Fair  Insight:  Fair  Psychomotor Activity:  Normal  Concentration:  Concentration: Fair and Attention Span: Fair  Recall:  [employer] of Knowledge:Fair  Language: Fair  Akathisia:  No  Handed:  Right  AIMS (if indicated):  na  Assets:  Communication Skills Desire for Improvement Financial Resources/Insurance Physical Health Social Support Vocational/Educational  ADL's:  Intact  Cognition: WNL  Sleep:  good    Treatment Plan Summary:  Generalized anxiety disorder Continue Luvox at 100 mg once daily. Continue therapy with [facility] Pied[facility]  Bipolar 2 disorder Continue Lamictal at 100mg  po qd.  Insomnia Continue Trazodone at 100mg  po qhs.   Return to clinic in 3 months time or call before if needed.   Elvin So, MD 5/21/20199:45 AM

## 2018-03-10 ENCOUNTER — Ambulatory Visit: Payer: No Typology Code available for payment source | Admitting: Licensed Clinical Social Worker

## 2018-03-10 MED FILL — lamoTRIgine 100 MG TABS: 100 | 90 days supply | Qty: 90 | Fill #0

## 2018-03-10 MED FILL — FLUVOXAMINE MALEATE 100 MG: 100 | 90 days supply | Qty: 90 | Fill #0

## 2018-03-17 MED FILL — traZODone HCL 100 MG TABS: 100 | 90 days supply | Qty: 90 | Fill #0

## 2018-03-28 ENCOUNTER — Ambulatory Visit (INDEPENDENT_AMBULATORY_CARE_PROVIDER_SITE_OTHER): Payer: No Typology Code available for payment source | Admitting: Obstetrics and Gynecology

## 2018-03-28 ENCOUNTER — Encounter: Payer: Self-pay | Admitting: Obstetrics and Gynecology

## 2018-03-28 ENCOUNTER — Other Ambulatory Visit: Payer: Self-pay

## 2018-03-28 VITALS — BP 110/60 | HR 64 | Resp 16 | Ht 65.0 in | Wt 148.0 lb

## 2018-03-28 DIAGNOSIS — Z30431 Encounter for routine checking of intrauterine contraceptive device: Secondary | ICD-10-CM | POA: Diagnosis not present

## 2018-03-28 NOTE — Progress Notes (Signed)
GYNECOLOGY  VISIT   HPI: 22 y.o.   Single  Caucasian  female   [id number] with Patient's last menstrual period was 02/20/2018.   here for IUD recheck.  IUD placed for dysmenorrhea.   Has had a little bit of brown bleeding since IUD inserted. No more of the crampy pain she was having previously.  Has some occasional pain lasting very briefly.  Has abdominal bloating and weight gain.   Periods of anxiety since the IUD was placed.  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   Her psychiatrist is Dr. Dwan Bolt, [facility]. She has an appointment next week.   GYNECOLOGIC HISTORY: Patient's last menstrual period was 02/20/2018. Contraception:  Kyleena IUD inserted 02/21/18 Menopausal hormone therapy:  n/a Last mammogram:  n/a Last pap smear:   11/21/17 Pap smear negative        OB History    Gravida  0   Para  0   Term  0   Preterm  0   AB  0   Living  0     SAB  0   TAB  0   Ectopic  0   Multiple  0   Live Births  0              Patient Active Problem List   Diagnosis Date Noted  . Acute non-recurrent frontal sinusitis 11/03/2017  . Well woman exam 06/19/2017  . MDD (major depressive disorder) 06/19/2017  . HLD (hyperlipidemia) 06/19/2017  . Bipolar 2 disorder, major depressive episode ([place]) 05/13/2017  . Generalized anxiety disorder 05/13/2017  . Menorrhagia 01/25/2014  . Anxiety state 01/26/2013  . Family history of hypercholesterolemia 01/06/2013  . Murmur 01/06/2013    Past Medical History:  Diagnosis Date  . Anxiety   . Depression   . Dysmenorrhea   . Ovarian torsion 2008    Past Surgical History:  Procedure Laterality Date  . OVARY SURGERY  06/30/2007   dx laparoscopy, reduction of right adnexal torsion, bilateral oophoropexy of each ovary to posterior uterine fundus    Current Outpatient Medications  Medication Sig Dispense Refill  . fluvoxaMINE (LUVOX) 100 MG tablet Take 1 tablet (100 mg total) by mouth at  bedtime. 90 tablet 2  . ibuprofen (ADVIL,MOTRIN) 800 MG tablet Take 1 tablet (800 mg total) by mouth every 8 (eight) hours as needed. 30 tablet 2  . lamoTRIgine (LAMICTAL) 100 MG tablet Take 1 tablet by mouth daily at night. 90 tablet 2  . traZODone (DESYREL) 100 MG tablet Take 1 tablet (100 mg total) by mouth at bedtime. 90 tablet 1   No current facility-administered medications for this visit.      ALLERGIES: Patient has no known allergies.  Family History  Problem Relation Age of Onset  . Hepatitis B Mother   . Anxiety disorder Mother   . Hypertension Father   . ADD / ADHD Brother   . Ovarian cancer Paternal Grandmother     Social History   Socioeconomic History  . Marital status: Single    Spouse name: Not on file  . Number of children: 0  . Years of education: Not on file  . Highest education level: Some college, no degree  Occupational History  . 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 Financial resource strain: Not hard at all  . Food insecurity:    Worry: Never true    Inability: Never true  .  Transportation needs:    Medical: No    Non-medical: No  Tobacco Use  . Smoking status: Never Smoker  . Smokeless tobacco: Never Used  Substance and Sexual Activity  . Alcohol use: No  . Drug use: No  . Sexual activity: Never    Birth control/protection: IUD    Comment: Kyleena inserted 02/21/18  Lifestyle  . Physical activity:    Days per week: 5 days    Minutes per session: 60 min  . Stress: Not at all  Relationships  . Social connections:    Talks on phone: Three times a week    Gets together: More than three times a week    Attends religious service: Never    Active member of club or organization: No    Attends meetings of clubs or organizations: Never    Relationship status: Never married  . Intimate partner violence:    Fear of current or ex partner: No    Emotionally abused: No    Physically abused: No    Forced sexual  activity: No  Other Topics Concern  . Not on file  Social History Narrative   ** Merged History Encounter **        Review of Systems  Constitutional: Negative.   HENT: Negative.   Eyes: Negative.   Respiratory: Negative.   Cardiovascular: Negative.   Gastrointestinal: Negative.   Endocrine: Negative.   Genitourinary: Negative.   Musculoskeletal: Negative.   Skin: Negative.   Allergic/Immunologic: Negative.   Neurological: Negative.   Hematological: Negative.   Psychiatric/Behavioral: Negative.     PHYSICAL EXAMINATION:    BP 110/60 (BP Location: Right Arm, Patient Position: Sitting, Cuff Size: Normal)   Pulse 64   Resp 16   Ht 5\' 5"  (1.651 m)   Wt 148 lb (67.1 kg)   LMP 02/20/2018   BMI 24.63 kg/m     General appearance: alert, cooperative and appears stated age   Pelvic: External genitalia:  no lesions              Urethra:  normal appearing urethra with no masses, tenderness or lesions              Bartholins and Skenes: normal                 Vagina: normal appearing vagina with normal color and discharge, no lesions              Cervix: no lesions.  Small amount of brown blood.  IUD strings seen.                 Bimanual Exam:  Uterus:  normal size, contour, position, consistency, mobility, non-tender              Adnexa: no mass, fullness, tenderness          Chaperone was present for exam.  ASSESSMENT  Status post [place] IUD insertion.  Anxiety.  Bipolar disorder.   PLAN  IUD bleeding profile discussed.  Working as contraception if needed.  She will see her psychiatrist to discuss her increased anxiety and potential mediation adjustment.  FU for AEX and prn.    An After Visit Summary was printed and given to the patient.  _15_____ minutes face to face time of which over 50% was spent in counseling.

## 2018-04-29 ENCOUNTER — Ambulatory Visit (INDEPENDENT_AMBULATORY_CARE_PROVIDER_SITE_OTHER): Payer: No Typology Code available for payment source | Admitting: Family Medicine

## 2018-04-29 ENCOUNTER — Encounter: Payer: Self-pay | Admitting: Family Medicine

## 2018-04-29 VITALS — BP 110/70 | HR 54 | Temp 97.7°F | Ht 65.0 in | Wt 150.5 lb

## 2018-04-29 DIAGNOSIS — H6123 Impacted cerumen, bilateral: Secondary | ICD-10-CM

## 2018-04-29 MED ORDER — EAR WAX CLEANSING 6.5 % OT KIT: PACK | OTIC | 2 refills | Status: DC

## 2018-04-29 NOTE — Progress Notes (Signed)
Subjective:  Patient ID: Victoria Simmons, female    DOB: 02/16/1996  Age: 22 y.o. MRN: [id number]  CC: Otalgia (x 1 week, ear feels swollen & painful, unable to hear.)   HPI Victoria Simmons presents for evaluation of a 2 to 3-day history of left ear discomfort with loss of hearing.  She denies nasal congestion postnasal drip fever chills.  She has been swimming recently but has not submerged her head.  She has no history of injury to the ear.  She does use Q-tips for her daily ear hygiene.  She does not has no history of bruxism.  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 Outpatient Medications Prior to Visit  Medication Sig Dispense Refill  . fluvoxaMINE (LUVOX) 100 MG tablet Take 1 tablet (100 mg total) by mouth at bedtime. 90 tablet 2  . ibuprofen (ADVIL,MOTRIN) 800 MG tablet Take 1 tablet (800 mg total) by mouth every 8 (eight) hours as needed. 30 tablet 2  . lamoTRIgine (LAMICTAL) 100 MG tablet Take 1 tablet by mouth daily at night. 90 tablet 2  . traZODone (DESYREL) 100 MG tablet Take 1 tablet (100 mg total) by mouth at bedtime. 90 tablet 1   No facility-administered medications prior to visit.     ROS Review of Systems  Constitutional: Negative for chills, fatigue, fever and unexpected weight change.  HENT: Positive for ear pain and hearing loss. Negative for ear discharge, postnasal drip, rhinorrhea, sinus pressure, sinus pain and trouble swallowing.   Eyes: Negative.   Respiratory: Negative.   Cardiovascular: Negative.   Gastrointestinal: Negative.   Endocrine: Negative for polyphagia.  Neurological: Negative for headaches.    Objective:  BP 110/70   Pulse (!) 54   Temp 97.7 F (36.5 C)   Ht 5' 5"  (1.651 m)   Wt 150 lb 8 oz (68.3 kg)   SpO2 98%   BMI 25.04 kg/m   BP Readings from Last 3 Encounters:  04/29/18 110/70  03/28/18 110/60  02/21/18 100/60    Wt Readings from Last 3 Encounters:  04/29/18 150 lb 8 oz (68.3 kg)    03/28/18 148 lb (67.1 kg)  02/21/18 145 lb (65.8 kg)    Physical Exam  Constitutional: She is oriented to person, place, and time. She appears well-developed and well-nourished. No distress.  HENT:  Head: Normocephalic and atraumatic.  Right Ear: External ear normal. A foreign body is present.  Left Ear: External ear normal. A foreign body is present.  Ears:  Mouth/Throat: Oropharynx is clear and moist. No oropharyngeal exudate.  Eyes: Pupils are equal, round, and reactive to light. Conjunctivae and EOM are normal. Right eye exhibits no discharge. Left eye exhibits no discharge. No scleral icterus.  Neck: Normal range of motion. Neck supple. No JVD present. No tracheal deviation present. No thyromegaly present.  Cardiovascular: Normal rate, regular rhythm and normal heart sounds.  Pulmonary/Chest: Effort normal and breath sounds normal.  Lymphadenopathy:    She has no cervical adenopathy.  Neurological: She is alert and oriented to person, place, and time.  Skin: Skin is warm and dry. No rash noted. She is not diaphoretic. No erythema. No pallor.  Psychiatric: She has a normal mood and affect. Her behavior is normal.   Procedure note: CMA irrigated both ears with a solution of water and hydrogen peroxide.  Patient tolerated the procedure well.  Cerumen was completely removed from the right ear.  There was a small amount left  in the left ear.  This was not adjacent to or obstructive of the TM.  Patient will purchase an over-the-counter kit and complete the remainder of the removal.  Lab Results  Component Value Date   WBC 5.7 11/21/2017   HGB 12.5 11/21/2017   HCT 37.2 11/21/2017   PLT 309 11/21/2017   GLUCOSE 89 11/21/2017   CHOL 174 11/21/2017   TRIG 128 11/21/2017   HDL 46 11/21/2017   LDLCALC 102 (H) 11/21/2017   ALT 16 11/21/2017   AST 15 11/21/2017   NA 142 11/21/2017   K 4.1 11/21/2017   CL 108 (H) 11/21/2017   CREATININE 0.80 11/21/2017   BUN 6 11/21/2017   CO2 19  (L) 11/21/2017   TSH 3.491 05/14/2017   HGBA1C 4.9 05/14/2017    No results found.  Assessment & Plan:   Jonica was seen today for otalgia.  Diagnoses and all orders for this visit:  Bilateral impacted cerumen -     Carbamide Peroxide-Saline (EAR WAX CLEANSING) 6.5 % KIT; As directed by kit   I am having Ladelle M. Cremeans start on EAR WAX CLEANSING. I am also having her maintain her ibuprofen, fluvoxaMINE, lamoTRIgine, and traZODone.  Meds ordered this encounter  Medications  . Carbamide Peroxide-Saline (EAR WAX CLEANSING) 6.5 % KIT    Sig: As directed by kit    Dispense:  1 kit    Refill:  2   Patient was given information on proper ear hygiene without Q-tips.  Follow-up: Return if symptoms worsen or fail to improve.  Libby Maw, MD

## 2018-04-29 NOTE — Patient Instructions (Signed)
Earwax Buildup, Adult The ears produce a substance called earwax that helps keep bacteria out of the ear and protects the skin in the ear canal. Occasionally, earwax can build up in the ear and cause discomfort or hearing loss. What increases the risk? This condition is more likely to develop in people who:  Are female.  Are elderly.  Naturally produce more earwax.  Clean their ears often with cotton swabs.  Use earplugs often.  Use in-ear headphones often.  Wear hearing aids.  Have narrow ear canals.  Have earwax that is overly thick or sticky.  Have eczema.  Are dehydrated.  Have excess hair in the ear canal.  What are the signs or symptoms? Symptoms of this condition include:  Reduced or muffled hearing.  A feeling of fullness in the ear or feeling that the ear is plugged.  Fluid coming from the ear.  Ear pain.  Ear itch.  Ringing in the ear.  Coughing.  An obvious piece of earwax that can be seen inside the ear canal.  How is this diagnosed? This condition may be diagnosed based on:  Your symptoms.  Your medical history.  An ear exam. During the exam, your health care provider will look into your ear with an instrument called an otoscope.  You may have tests, including a hearing test. How is this treated? This condition may be treated by:  Using ear drops to soften the earwax.  Having the earwax removed by a health care provider. The health care provider may: ? Flush the ear with water. ? Use an instrument that has a loop on the end (curette). ? Use a suction device.  Surgery to remove the wax buildup. This may be done in severe cases.  Follow these instructions at home:  Take over-the-counter and prescription medicines only as told by your health care provider.  Do not put any objects, including cotton swabs, into your ear. You can clean the opening of your ear canal with a washcloth or facial tissue.  Follow instructions from your health  care provider about cleaning your ears. Do not over-clean your ears.  Drink enough fluid to keep your urine clear or pale yellow. This will help to thin the earwax.  Keep all follow-up visits as told by your health care provider. If earwax builds up in your ears often or if you use hearing aids, consider seeing your health care provider for routine, preventive ear cleanings. Ask your health care provider how often you should schedule your cleanings.  If you have hearing aids, clean them according to instructions from the manufacturer and your health care provider. Contact a health care provider if:  You have ear pain.  You develop a fever.  You have blood, pus, or other fluid coming from your ear.  You have hearing loss.  You have ringing in your ears that does not go away.  Your symptoms do not improve with treatment.  You feel like the room is spinning (vertigo). Summary  Earwax can build up in the ear and cause discomfort or hearing loss.  The most common symptoms of this condition include reduced or muffled hearing and a feeling of fullness in the ear or feeling that the ear is plugged.  This condition may be diagnosed based on your symptoms, your medical history, and an ear exam.  This condition may be treated by using ear drops to soften the earwax or by having the earwax removed by a health care provider.  Do   not put any objects, including cotton swabs, into your ear. You can clean the opening of your ear canal with a washcloth or facial tissue. This information is not intended to replace advice given to you by your health care provider. Make sure you discuss any questions you have with your health care provider. Document Released: 11/01/2004 Document Revised: 12/05/2016 Document Reviewed: 12/05/2016 Elsevier Interactive Patient Education  2018 Elsevier Inc.  

## 2018-05-08 ENCOUNTER — Encounter: Payer: Self-pay | Admitting: Psychiatry

## 2018-05-08 ENCOUNTER — Ambulatory Visit: Payer: No Typology Code available for payment source | Admitting: Psychiatry

## 2018-05-08 VITALS — BP 100/62 | HR 69 | Ht 65.0 in | Wt 154.0 lb

## 2018-05-08 DIAGNOSIS — F331 Major depressive disorder, recurrent, moderate: Secondary | ICD-10-CM | POA: Diagnosis not present

## 2018-05-08 DIAGNOSIS — F411 Generalized anxiety disorder: Secondary | ICD-10-CM

## 2018-05-08 NOTE — Progress Notes (Signed)
Psychiatric progress note  Patient Identification: Victoria Simmons MRN:  [id number] Date of Evaluation:  05/08/2018 Referral Source: Dr.Aaron Chief Complaint: doing well  Chief Complaint    Medication Management     Visit Diagnosis:    ICD-10-CM   1. Moderate episode of recurrent major depressive disorder (HCC) F33.1   2. GAD (generalized anxiety disorder) F41.1     History of Present Illness: Patient was seen today for follow-up of depression and anxiety. Patient today reports that she continues to do well.  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 Sleeping well on trazodone. She went on some trips in the summer. Denies any mood symptoms or anxiety. Denies any suicidal thoughts. Compliant with her medications.  Past Psychiatric History: Saw a psychologist twice earlier in the year.  Previous Psychotropic Medications: Yes , zoloft- stopped working at 41m , tried Prozac couple years ago but did not help.   Substance Abuse History in the last 12 months:  No.  Consequences of Substance Abuse: Negative  Past Medical History:  Past Medical History:  Diagnosis Date  . Anxiety   . Depression   . Dysmenorrhea   . Ovarian torsion 2008    Past Surgical History:  Procedure Laterality Date  . OVARY SURGERY  06/30/2007   dx laparoscopy, reduction of right adnexal torsion, bilateral oophoropexy of each ovary to posterior uterine fundus    Family Psychiatric History: Mother has anxiety  Family History:  Family History  Problem Relation Age of Onset  . Hepatitis B Mother   . Anxiety disorder Mother   . Hypertension Father   . ADD / ADHD Brother   . Ovarian cancer Paternal Grandmother     Social History:   Social History   Socioeconomic History  . Marital status: Single    Spouse name: Not on file  . Number of children: 0  . Years of education: Not on file  . Highest education level: Some college, no degree  Occupational History  . Occupation: friends play house    Comment:  full time  Social Needs  . Financial resource strain: Not hard at all  . Food insecurity:    Worry: Never true    Inability: Never true  . Transportation needs:    Medical: No    Non-medical: No  Tobacco Use  . Smoking status: Never Smoker  . Smokeless tobacco: Never Used  Substance and Sexual Activity  . Alcohol use: No  . Drug use: No  . Sexual activity: Never    Birth control/protection: IUD    Comment: Kyleena inserted 02/21/18  Lifestyle  . Physical activity:    Days per week: 5 days    Minutes per session: 60 min  . Stress: Not at all  Relationships  . Social connections:    Talks on phone: Three times a week    Gets together: More than three times a week    Attends religious service: Never    Active member of club or organization: No    Attends meetings of clubs or organizations: Never    Relationship status: Never married  Other Topics Concern  . Not on file  Social History Narrative   ** Merged History Encounter **        Additional Social History: Lives with biological parents and 128yo brother and 171yo sister.  Allergies:  No Known Allergies  Metabolic Disorder Labs: Lab Results  Component Value Date   HGBA1C 4.9 05/14/2017   MPG 94 05/14/2017  No results found for: PROLACTIN Lab Results  Component Value Date   CHOL 174 11/21/2017   TRIG 128 11/21/2017   HDL 46 11/21/2017   CHOLHDL 3.8 11/21/2017   VLDL 30 05/14/2017   LDLCALC 102 (H) 11/21/2017   LDLCALC 163 (H) 05/14/2017     Current Medications: Current Outpatient Medications  Medication Sig Dispense Refill  . Carbamide Peroxide-Saline (EAR WAX CLEANSING) 6.5 % KIT As directed by kit 1 kit 2  . fluvoxaMINE (LUVOX) 100 MG tablet Take 1 tablet (100 mg total) by mouth at bedtime. 90 tablet 2  . ibuprofen (ADVIL,MOTRIN) 800 MG tablet Take 1 tablet (800 mg total) by mouth every 8 (eight) hours as needed. 30 tablet 2  . lamoTRIgine (LAMICTAL) 100 MG tablet Take 1 tablet by mouth daily at  night. 90 tablet 2  . traZODone (DESYREL) 100 MG tablet Take 1 tablet (100 mg total) by mouth at bedtime. 90 tablet 1   No current facility-administered medications for this visit.     Neurologic: Headache: No Seizure: No Paresthesias:No  Musculoskeletal: Strength & Muscle Tone: within normal limits Gait & Station: normal Patient leans: N/A  Psychiatric Specialty Exam: ROS  Blood pressure 100/62, pulse 69, height 5' 5"  (1.651 m), weight 69.9 kg (154 lb), SpO2 94 %.Body mass index is 25.63 kg/m.  General Appearance: Casual  Eye Contact:  Fair  Speech:  Clear and Coherent  Volume:  Normal  Mood:  Much improved  Affect:  Pleasant   Thought Process:  Coherent  Orientation:  Full (Time, Place, and Person)  Thought Content:  WDL and Logical  Suicidal Thoughts: None   Homicidal Thoughts:  No  Memory:  Immediate;   Fair Recent;   Fair Remote;   Fair  Judgement:  Fair  Insight:  Fair  Psychomotor Activity:  Normal  Concentration:  Concentration: Fair and Attention Span: Fair  Recall:  [employer] of Knowledge:Fair  Language: Fair  Akathisia:  No  Handed:  Right  AIMS (if indicated):  na  Assets:  Communication Skills Desire for Improvement Financial Resources/Insurance Physical Health Social Support Vocational/Educational  ADL's:  Intact  Cognition: WNL  Sleep:  good    Treatment Plan Summary:  Generalized anxiety disorder Continue Luvox at 100 mg once daily. Continue therapy with Royal Piedra  Bipolar 2 disorder Continue Lamictal at 156m po qd.  Insomnia Continue Trazodone at 1018mpo qhs.   Return to clinic in 3 months time or call before if needed.   HiElvin SoMD 8/1/20198:52 AM

## 2018-06-02 ENCOUNTER — Ambulatory Visit: Payer: No Typology Code available for payment source | Admitting: Licensed Clinical Social Worker

## 2018-06-02 DIAGNOSIS — F411 Generalized anxiety disorder: Secondary | ICD-10-CM | POA: Diagnosis not present

## 2018-06-02 DIAGNOSIS — F331 Major depressive disorder, recurrent, moderate: Secondary | ICD-10-CM

## 2018-06-11 MED FILL — FLUVOXAMINE MALEATE 100 MG: 100 | 90 days supply | Qty: 90 | Fill #1

## 2018-06-11 MED FILL — traZODone HCL 100 MG TABS: 100 | 90 days supply | Qty: 90 | Fill #1

## 2018-06-11 MED FILL — lamoTRIgine 100 MG TABS: 100 | 90 days supply | Qty: 90 | Fill #1

## 2018-06-11 NOTE — Progress Notes (Signed)
   THERAPIST PROGRESS NOTE  Session Time: 10min  Participation Level: Active  Behavioral Response: CasualAlertEuthymic  Type of Therapy: Individual Therapy  Treatment Goals addressed: Coping and Diagnosis: Depression, Anxiety, Bipolar  Interventions: CBT and Motivational Interviewing  Summary: Victoria Simmons is a 22 y.o. female who presents with an increase in her symptoms.  Patient reports that she feels that she is spiraling.  She reports that she is working longer hours and is overwhelmed.  Discussion of her diagnosis in depth and what each means to her.  Discussion of her goals and how she plans to reach each. DIscussion of coping skills: music, planning a vacation, meditation, etc.   Suicidal/Homicidal: No  Plan: Return again in 2 weeks.  Diagnosis: Axis I: Mood Disorder NOS    Axis II: No diagnosis    Lubertha South, LCSW 06/02/2018

## 2018-06-23 ENCOUNTER — Encounter

## 2018-06-23 ENCOUNTER — Ambulatory Visit (INDEPENDENT_AMBULATORY_CARE_PROVIDER_SITE_OTHER): Payer: No Typology Code available for payment source | Admitting: Licensed Clinical Social Worker

## 2018-06-23 DIAGNOSIS — F331 Major depressive disorder, recurrent, moderate: Secondary | ICD-10-CM

## 2018-06-23 DIAGNOSIS — F411 Generalized anxiety disorder: Secondary | ICD-10-CM

## 2018-07-02 ENCOUNTER — Ambulatory Visit: Payer: No Typology Code available for payment source | Admitting: Licensed Clinical Social Worker

## 2018-07-02 ENCOUNTER — Encounter: Payer: Self-pay | Admitting: Licensed Clinical Social Worker

## 2018-07-02 DIAGNOSIS — F331 Major depressive disorder, recurrent, moderate: Secondary | ICD-10-CM
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
## 2018-07-07 ENCOUNTER — Ambulatory Visit: Payer: No Typology Code available for payment source | Admitting: Licensed Clinical Social Worker

## 2018-07-07 ENCOUNTER — Encounter: Payer: Self-pay | Admitting: Licensed Clinical Social Worker

## 2018-07-07 DIAGNOSIS — F331 Major depressive disorder, recurrent, moderate: Secondary | ICD-10-CM | POA: Diagnosis not present
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
## 2018-07-09 ENCOUNTER — Ambulatory Visit (INDEPENDENT_AMBULATORY_CARE_PROVIDER_SITE_OTHER): Payer: No Typology Code available for payment source | Admitting: Obstetrics and Gynecology

## 2018-07-09 ENCOUNTER — Encounter: Payer: Self-pay | Admitting: Obstetrics and Gynecology

## 2018-07-09 ENCOUNTER — Telehealth: Payer: Self-pay | Admitting: Obstetrics and Gynecology

## 2018-07-09 ENCOUNTER — Other Ambulatory Visit: Payer: Self-pay

## 2018-07-09 VITALS — BP 100/68 | HR 64 | Ht 65.0 in | Wt 154.0 lb

## 2018-07-09 DIAGNOSIS — R42 Dizziness and giddiness: Secondary | ICD-10-CM | POA: Diagnosis not present

## 2018-07-09 DIAGNOSIS — R102 Pelvic and perineal pain: Secondary | ICD-10-CM

## 2018-07-09 NOTE — Progress Notes (Signed)
GYNECOLOGY  VISIT   HPI: 22 y.o.   Single  Caucasian  female   G0P0000 with Patient's last menstrual period was 06/28/2018.   here for pelvic pain and irregular bleeding.  Kyleena IUD inserted on 02/20/18 for painful menses.   Had brown discharge the month of June.  No bleeding July or August.  In September, on 21st she started with red bleeding and having pad changes every 4 hours.  Some dizziness, lightheadedness.   Having bad cramping in lower abdomen and radiates into her upper abdomen.  Some nausea. Tried Midol, tylenol, Aleve 1 - 2 at a time, and Ibuprofen 800 mg.  Nothing helping the pain and she feels like it is increasing.  This is a typical pattern for her painful menses.  Some diarrhea for the last week.  No fever.   Not sexually active since IUD was placed.   Abdominal pain and nausea with NuvaRing use in the past.  Had a GI work up with endoscopy and colonoscopy which were normal.  GYNECOLOGIC HISTORY: Patient's last menstrual period was 06/28/2018. Contraception:  IUD [place] 02/21/2018 Menopausal hormone therapy:  n/a Last mammogram:  n/a Last pap smear:   11/21/2017 normal        OB History    Gravida  0   Para  0   Term  0   Preterm  0   AB  0   Living  0     SAB  0   TAB  0   Ectopic  0   Multiple  0   Live Births  0              Patient Active Problem List   Diagnosis Date Noted  . Bilateral impacted cerumen 04/29/2018  . Acute non-recurrent frontal sinusitis 11/03/2017  . Well woman exam 06/19/2017  . MDD (major depressive disorder) 06/19/2017  . HLD (hyperlipidemia) 06/19/2017  . Bipolar 2 disorder, major depressive episode ([place]) 05/13/2017  . Generalized anxiety disorder 05/13/2017  . Menorrhagia 01/25/2014  . Anxiety state 01/26/2013  . Family history of hypercholesterolemia 01/06/2013  . Murmur 01/06/2013    Past Medical History:  Diagnosis Date  . Anxiety   . Depression   . Dysmenorrhea   . Ovarian torsion  2008    Past Surgical History:  Procedure Laterality Date  . OVARY SURGERY  06/30/2007   dx laparoscopy, reduction of right adnexal torsion, bilateral oophoropexy of each ovary to posterior uterine fundus    Current Outpatient Medications  Medication Sig Dispense Refill  . Carbamide Peroxide-Saline (EAR WAX CLEANSING) 6.5 % KIT As directed by kit 1 kit 2  . fluvoxaMINE (LUVOX) 100 MG tablet Take 1 tablet (100 mg total) by mouth at bedtime. 90 tablet 2  . ibuprofen (ADVIL,MOTRIN) 800 MG tablet Take 1 tablet (800 mg total) by mouth every 8 (eight) hours as needed. 30 tablet 2  . lamoTRIgine (LAMICTAL) 100 MG tablet Take 1 tablet by mouth daily at night. 90 tablet 2  . Levonorgestrel (KYLEENA IU) by Intrauterine route.    . traZODone (DESYREL) 100 MG tablet Take 1 tablet (100 mg total) by mouth at bedtime. 90 tablet 1   No current facility-administered medications for this visit.      ALLERGIES: Patient has no known allergies.  Family History  Problem Relation Age of Onset  . Hepatitis B Mother   . Anxiety disorder Mother   . Hypertension Father   . ADD / ADHD Brother   . Ovarian  cancer Paternal Grandmother     Social History   Socioeconomic History  . Marital status: Single    Spouse name: Not on file  . Number of children: 0  . Years of education: Not on file  . Highest education level: Some college, no degree  Occupational History  . 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 Financial resource strain: Not hard at all  . Food insecurity:    Worry: Never true    Inability: Never true  . Transportation needs:    Medical: No    Non-medical: No  Tobacco Use  . Smoking status: Never Smoker  . Smokeless tobacco: Never Used  Substance and Sexual Activity  . Alcohol use: No  . Drug use: No  . Sexual activity: Never    Birth control/protection: IUD    Comment: Kyleena inserted 02/21/18  Lifestyle  . Physical activity:    Days per week: 5  days    Minutes per session: 60 min  . Stress: Not at all  Relationships  . Social connections:    Talks on phone: Three times a week    Gets together: More than three times a week    Attends religious service: Never    Active member of club or organization: No    Attends meetings of clubs or organizations: Never    Relationship status: Never married  . Intimate partner violence:    Fear of current or ex partner: No    Emotionally abused: No    Physically abused: No    Forced sexual activity: No  Other Topics Concern  . Not on file  Social History Narrative   ** Merged History Encounter **        Review of Systems  Constitutional: Negative.   HENT: Negative.   Eyes: Negative.   Respiratory: Negative.   Cardiovascular: Negative.   Gastrointestinal: Negative.   Endocrine: Negative.   Genitourinary: Positive for pelvic pain and vaginal bleeding.  Musculoskeletal: Negative.   Skin: Negative.   Allergic/Immunologic: Negative.   Neurological: Negative.   Hematological: Negative.   Psychiatric/Behavioral: Negative.   All other systems reviewed and are negative.   PHYSICAL EXAMINATION:    BP 100/68   Pulse 64   Ht _0  (1.651 m)   Wt 154 lb (69.9 kg)   LMP 06/28/2018   BMI 25.63 kg/m     General appearance: alert, cooperative and appears stated age   Pelvic: External genitalia:  no lesions              Urethra:  normal appearing urethra with no masses, tenderness or lesions              Bartholins and Skenes: normal                 Vagina: normal appearing vagina with normal color and discharge, no lesions              Cervix: no lesions.  IUD strings noted.  No CMT.                 Bimanual Exam:  Uterus:  normal size, contour, position, consistency, mobility, non-tender              Adnexa: no mass, fullness, tenderness            Chaperone was present for exam.  ASSESSMENT  Pelvic pain.  No acute abdomen.  Lightheadedness. Kyleena IUD.  Hx dysmenorrhea  and ovarian cyst with torsion.   PLAN  CBC now.  We discussed ovulatory cycles and dysmenorrhea and ovarian cysts.  She will return for a pelvic US tomorrow.  Continue Motrin 800 mg po q 8 hours prn.    An After Visit Summary was printed and given to the patient.  ___15___ minutes face to face time of which over 50% was spent in counseling.

## 2018-07-09 NOTE — Telephone Encounter (Signed)
Patient's mother, [name] (OK per [facility]), calling stating that patient got an IUD inserted in May. Patient started bleeding and "really bad cramping" on 9/21. Patient has not had any bleeding since right after insertion.

## 2018-07-09 NOTE — Telephone Encounter (Signed)
Spoke with patient and her mother. Kyleena IUD placed 02/21/18, no menses with IUD. Bleeding and cramping started 2 wks ago, flow is light. Pain 8/10, not relieved by motrin, aleve or Midol. Patient can feel IUD strings. Reports nausea, denies fever/chills or vomiting. Patient states she has never been SA.   Recommended OV for further evaluation of pain and bleeding. OV scheduled for today at 11:30am with Dr. Quincy Simmonds.   Patient and mom verbalizes understanding and are agreeable.   Encounter closed.

## 2018-07-10 ENCOUNTER — Ambulatory Visit: Payer: No Typology Code available for payment source | Admitting: Obstetrics and Gynecology

## 2018-07-10 ENCOUNTER — Encounter: Payer: Self-pay | Admitting: Obstetrics and Gynecology

## 2018-07-10 ENCOUNTER — Ambulatory Visit (INDEPENDENT_AMBULATORY_CARE_PROVIDER_SITE_OTHER): Payer: No Typology Code available for payment source

## 2018-07-10 ENCOUNTER — Other Ambulatory Visit: Payer: Self-pay

## 2018-07-10 VITALS — BP 98/58 | HR 64 | Resp 15 | Wt 153.0 lb

## 2018-07-10 DIAGNOSIS — N921 Excessive and frequent menstruation with irregular cycle: Secondary | ICD-10-CM

## 2018-07-10 DIAGNOSIS — R102 Pelvic and perineal pain: Secondary | ICD-10-CM | POA: Diagnosis not present

## 2018-07-10 DIAGNOSIS — Z30431 Encounter for routine checking of intrauterine contraceptive device: Secondary | ICD-10-CM

## 2018-07-10 LAB — CBC
Hematocrit: 37.2 % (ref 34.0–46.6)
Hemoglobin: 12.3 g/dL (ref 11.1–15.9)
MCH: 28.3 pg (ref 26.6–33.0)
MCHC: 33.1 g/dL (ref 31.5–35.7)
MCV: 86 fL (ref 79–97)
Platelets: 249 10*3/uL (ref 150–450)
RBC: 4.35 x10E6/uL (ref 3.77–5.28)
RDW: 13.2 % (ref 12.3–15.4)
WBC: 9.6 10*3/uL (ref 3.4–10.8)

## 2018-07-10 NOTE — Progress Notes (Signed)
GYNECOLOGY  VISIT   HPI: 22 y.o.   Single  Caucasian  female   G0P0000 with Patient's last menstrual period was 06/28/2018.   here for pelvic pain ultrasound.  She has sudden onset of pain with menstruation, which has been lingering.   Pain tapered off last hs.  Pelvic US now was painful, more in the right.  Still having some bleeding.   CBC from yesterday reviewed and normal.  GYNECOLOGIC HISTORY: Patient's last menstrual period was 06/28/2018. Contraception:  IUD - Kyleena placed in May 2019. Menopausal hormone therapy:  none Last mammogram:  n/a Last pap smear:   11/21/2017 normal        OB History    Gravida  0   Para  0   Term  0   Preterm  0   AB  0   Living  0     SAB  0   TAB  0   Ectopic  0   Multiple  0   Live Births  0              Patient Active Problem List   Diagnosis Date Noted  . Bilateral impacted cerumen 04/29/2018  . Acute non-recurrent frontal sinusitis 11/03/2017  . Well woman exam 06/19/2017  . MDD (major depressive disorder) 06/19/2017  . HLD (hyperlipidemia) 06/19/2017  . Bipolar 2 disorder, major depressive episode ([place]) 05/13/2017  . Generalized anxiety disorder 05/13/2017  . Menorrhagia 01/25/2014  . Anxiety state 01/26/2013  . Family history of hypercholesterolemia 01/06/2013  . Murmur 01/06/2013    Past Medical History:  Diagnosis Date  . Anxiety   . Depression   . Dysmenorrhea   . Ovarian torsion 2008    Past Surgical History:  Procedure Laterality Date  . OVARY SURGERY  06/30/2007   dx laparoscopy, reduction of right adnexal torsion, bilateral oophoropexy of each ovary to posterior uterine fundus    Current Outpatient Medications  Medication Sig Dispense Refill  . fluvoxaMINE (LUVOX) 100 MG tablet Take 1 tablet (100 mg total) by mouth at bedtime. 90 tablet 2  . ibuprofen (ADVIL,MOTRIN) 800 MG tablet Take 1 tablet (800 mg total) by mouth every 8 (eight) hours as needed. 30 tablet 2  .  lamoTRIgine (LAMICTAL) 100 MG tablet Take 1 tablet by mouth daily at night. 90 tablet 2  . Levonorgestrel (KYLEENA IU) by Intrauterine route.    . traZODone (DESYREL) 100 MG tablet Take 1 tablet (100 mg total) by mouth at bedtime. 90 tablet 1   No current facility-administered medications for this visit.      ALLERGIES: Patient has no known allergies.  Family History  Problem Relation Age of Onset  . Hepatitis B Mother   . Anxiety disorder Mother   . Hypertension Father   . ADD / ADHD Brother   . Ovarian cancer Paternal Grandmother     Social History   Socioeconomic History  . Marital status: Single    Spouse name: Not on file  . Number of children: 0  . Years of education: Not on file  . Highest education level: Some college, no degree  Occupational History  . 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 Financial resource strain: Not hard at all  . Food insecurity:    Worry: Never true    Inability: Never true  . Transportation needs:    Medical: No    Non-medical: No  Tobacco Use  .  Smoking status: Never Smoker  . Smokeless tobacco: Never Used  Substance and Sexual Activity  . Alcohol use: No  . Drug use: No  . Sexual activity: Never    Birth control/protection: IUD    Comment: Kyleena inserted 02/21/18  Lifestyle  . Physical activity:    Days per week: 5 days    Minutes per session: 60 min  . Stress: Not at all  Relationships  . Social connections:    Talks on phone: Three times a week    Gets together: More than three times a week    Attends religious service: Never    Active member of club or organization: No    Attends meetings of clubs or organizations: Never    Relationship status: Never married  . Intimate partner violence:    Fear of current or ex partner: No    Emotionally abused: No    Physically abused: No    Forced sexual activity: No  Other Topics Concern  . Not on file  Social History Narrative   ** Merged  History Encounter **        Review of Systems  Constitutional: Negative.   HENT: Negative.   Eyes: Negative.   Respiratory: Negative.   Cardiovascular: Negative.   Gastrointestinal: Positive for abdominal pain and diarrhea.  Endocrine: Negative.   Genitourinary: Positive for vaginal bleeding.       Unscheduled bleeding  Musculoskeletal: Negative.   Skin: Negative.   Allergic/Immunologic: Negative.   Neurological: Negative.   Hematological: Negative.   Psychiatric/Behavioral: Negative.   All other systems reviewed and are negative.   PHYSICAL EXAMINATION:    BP (!) 98/58   Pulse 64   Resp 15   Wt 153 lb (69.4 kg)   LMP 06/28/2018   BMI 25.46 kg/m     General appearance: alert, cooperative and appears stated age   Pelvic US No myometrial masses.  IUD in endometrial canal.  EMS 4.68 mm. Right ovary normal.  Left ovary slightly increased in size compared to left ovary and peripheral follicles noted.  Small amount of free fluid around bilateral adnexa.   ASSESSMENT  Pelvic pain.  Possible ovarian cyst rupture.  Prolonged menstruation.  Kyleena IUD in normal position.    PLAN  Reassurance given.  She declines any additional hormonal supplementation to control her bleeding.  We did review bleeding profiles with Kyleena.  She will call for heavy bleeding or increasing pain.  FU for annual exam and prn. Questions invited and answered.   An After Visit Summary was printed and given to the patient.  ___15___ minutes face to face time of which over 50% was spent in counseling.

## 2018-07-16 ENCOUNTER — Ambulatory Visit: Payer: No Typology Code available for payment source | Admitting: Licensed Clinical Social Worker

## 2018-07-16 ENCOUNTER — Encounter: Payer: Self-pay | Admitting: Licensed Clinical Social Worker

## 2018-07-16 DIAGNOSIS — F331 Major depressive disorder, recurrent, moderate: Secondary | ICD-10-CM
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
## 2018-07-23 ENCOUNTER — Encounter: Payer: Self-pay | Admitting: Licensed Clinical Social Worker

## 2018-07-23 ENCOUNTER — Ambulatory Visit: Payer: No Typology Code available for payment source | Admitting: Licensed Clinical Social Worker

## 2018-07-23 DIAGNOSIS — F331 Major depressive disorder, recurrent, moderate: Secondary | ICD-10-CM | POA: Diagnosis not present

## 2018-07-23 DIAGNOSIS — F411 Generalized anxiety disorder: Secondary | ICD-10-CM | POA: Diagnosis not present

## 2018-07-23 NOTE — Progress Notes (Signed)
   THERAPIST PROGRESS NOTE  Session Time: 0900  Participation Level: Active  Behavioral Response: CasualAlertNA  Type of Therapy: Individual Therapy  Treatment Goals addressed: Anxiety  Interventions: Supportive  Summary: Victoria Simmons is a 22 y.o. female who presents with anxiety and depression. Laural reports things have continued to go well for her over the last week. She states her anxiety level has been low and manageable. She reports there have been a few changes at work which have added to her anxiety level, but she reports being able to manage her anxiety before it got "out of control," by utilizing coping mechanisms such as deep breathing. She reports she has "stayed busy," over the last week, and plans to go camping this weekend with her family members in the [place]. She reports she plans to begin a kickboxing class on November 4th with her father, which she believes will also have a positive impact on her mental health.    Suicidal/Homicidal: No  Therapist Response: Sandralee is managing her anxiety well and continues to make improvements in regulating her emotions. We will move to every other week sessions, which Finola was in agreement with. She reported anxiety around "not being able to see me if she needed to," however, we discussed this and she reported feeling comfortable with the switch.   Plan: Return again in 2 weeks.  Diagnosis: Axis I: Generalized Anxiety Disorder    Axis II: No diagnosis    [name], LCSW 07/23/2018

## 2018-08-05 ENCOUNTER — Ambulatory Visit (INDEPENDENT_AMBULATORY_CARE_PROVIDER_SITE_OTHER): Payer: No Typology Code available for payment source | Admitting: Licensed Clinical Social Worker

## 2018-08-05 ENCOUNTER — Ambulatory Visit: Payer: No Typology Code available for payment source | Admitting: Psychiatry

## 2018-08-05 ENCOUNTER — Encounter: Payer: Self-pay | Admitting: Licensed Clinical Social Worker

## 2018-08-05 DIAGNOSIS — F411 Generalized anxiety disorder: Secondary | ICD-10-CM

## 2018-08-05 DIAGNOSIS — F331 Major depressive disorder, recurrent, moderate: Secondary | ICD-10-CM
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
## 2018-08-07 ENCOUNTER — Encounter: Payer: Self-pay | Admitting: Psychiatry

## 2018-08-07 ENCOUNTER — Ambulatory Visit (INDEPENDENT_AMBULATORY_CARE_PROVIDER_SITE_OTHER): Payer: No Typology Code available for payment source | Admitting: Psychiatry

## 2018-08-07 ENCOUNTER — Other Ambulatory Visit: Payer: Self-pay

## 2018-08-07 VITALS — BP 102/67 | HR 66 | Temp 97.8°F | Wt 152.6 lb

## 2018-08-07 DIAGNOSIS — F331 Major depressive disorder, recurrent, moderate: Secondary | ICD-10-CM

## 2018-08-07 DIAGNOSIS — F411 Generalized anxiety disorder: Secondary | ICD-10-CM

## 2018-08-07 MED ORDER — TRAZODONE HCL 100 MG PO TABS: 100.0000 mg | ORAL_TABLET | Freq: Every day | ORAL | 1 refills | Status: DC

## 2018-08-07 NOTE — Progress Notes (Signed)
Psychiatric progress note  Patient Identification: Victoria Simmons MRN:  [id number] Date of Evaluation:  08/07/2018 Referral Source: Dr.Aaron Chief Complaint: doing well  Chief Complaint    Follow-up; Medication Refill     Visit Diagnosis:    ICD-10-CM   1. MDD (major depressive disorder), recurrent episode, moderate (HCC) F33.1   2. GAD (generalized anxiety disorder) F41.1     History of Present Illness: Patient was seen today for follow-up of depression and anxiety. Patient today reports that she continues to do well.  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  Reports that she is doing well at her job.  Sleeping well on trazodone. Denies any mood symptoms or anxiety. Denies any suicidal thoughts. Compliant with her medications.  Past Psychiatric History: Saw a psychologist twice earlier in the year.  Previous Psychotropic Medications: Yes , zoloft- stopped working at 50mg  , tried Prozac couple years ago but did not help.   Substance Abuse History in the last 12 months:  No.  Consequences of Substance Abuse: Negative  Past Medical History:  Past Medical History:  Diagnosis Date  . Anxiety   . Depression   . Dysmenorrhea   . Ovarian torsion 2008    Past Surgical History:  Procedure Laterality Date  . OVARY SURGERY  06/30/2007   dx laparoscopy, reduction of right adnexal torsion, bilateral oophoropexy of each ovary to posterior uterine fundus    Family Psychiatric History: Mother has anxiety  Family History:  Family History  Problem Relation Age of Onset  . Hepatitis B Mother   . Anxiety disorder Mother   . Hypertension Father   . ADD / ADHD Brother   . Ovarian cancer Paternal Grandmother     Social History:   Social History   Socioeconomic History  . Marital status: Single    Spouse name: Not on file  . Number of children: 0  . Years of education: Not on file  . Highest education level: Some college,  no degree  Occupational History  . 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 Financial resource strain: Not hard at all  . Food insecurity:    Worry: Never true    Inability: Never true  . Transportation needs:    Medical: No    Non-medical: No  Tobacco Use  . Smoking status: Never Smoker  . Smokeless tobacco: Never Used  Substance and Sexual Activity  . Alcohol use: No  . Drug use: No  . Sexual activity: Never    Birth control/protection: IUD    Comment: Kyleena inserted 02/21/18  Lifestyle  . Physical activity:    Days per week: 5 days    Minutes per session: 60 min  . Stress: Not at all  Relationships  . Social connections:    Talks on phone: Three times a week    Gets together: More than three times a week    Attends religious service: Never    Active member of club or organization: No    Attends meetings of clubs or organizations: Never    Relationship status: Never married  Other Topics Concern  . Not on file  Social History Narrative   ** Merged History Encounter **        Additional Social History: Lives with biological parents and 53 yo brother and 60 yo sister.  Allergies:  No Known Allergies  Metabolic Disorder Labs: Lab Results  Component  Value Date   HGBA1C 4.9 05/14/2017   MPG 94 05/14/2017   No results found for: PROLACTIN Lab Results  Component Value Date   CHOL 174 11/21/2017   TRIG 128 11/21/2017   HDL 46 11/21/2017   CHOLHDL 3.8 11/21/2017   VLDL 30 05/14/2017   LDLCALC 102 (H) 11/21/2017   LDLCALC 163 (H) 05/14/2017     Current Medications: Current Outpatient Medications  Medication Sig Dispense Refill  . fluvoxaMINE (LUVOX) 100 MG tablet Take 1 tablet (100 mg total) by mouth at bedtime. 90 tablet 2  . ibuprofen (ADVIL,MOTRIN) 800 MG tablet Take 1 tablet (800 mg total) by mouth every 8 (eight) hours as needed. 30 tablet 2  . lamoTRIgine (LAMICTAL) 100 MG tablet Take 1 tablet by mouth daily at night. 90  tablet 2  . Levonorgestrel (KYLEENA IU) by Intrauterine route.    . traZODone (DESYREL) 100 MG tablet Take 1 tablet (100 mg total) by mouth at bedtime. 90 tablet 1   No current facility-administered medications for this visit.     Neurologic: Headache: No Seizure: No Paresthesias:No  Musculoskeletal: Strength & Muscle Tone: within normal limits Gait & Station: normal Patient leans: N/A  Psychiatric Specialty Exam: ROS  Blood pressure 102/67, pulse 66, temperature 97.8 F (36.6 C), temperature source Oral, weight 152 lb 9.6 oz (69.2 kg).Body mass index is 25.39 kg/m.  General Appearance: Casual  Eye Contact:  Fair  Speech:  Clear and Coherent  Volume:  Normal  Mood: good  Affect:  Pleasant   Thought Process:  Coherent  Orientation:  Full (Time, Place, and Person)  Thought Content:  WDL and Logical  Suicidal Thoughts: None   Homicidal Thoughts:  No  Memory:  Immediate;   Fair Recent;   Fair Remote;   Fair  Judgement:  Fair  Insight:  Fair  Psychomotor Activity:  Normal  Concentration:  Concentration: Fair and Attention Span: Fair  Recall:  [employer] of Knowledge:Fair  Language: Fair  Akathisia:  No  Handed:  Right  AIMS (if indicated):  na  Assets:  Communication Skills Desire for Improvement Financial Resources/Insurance Physical Health Social Support Vocational/Educational  ADL's:  Intact  Cognition: WNL  Sleep:  good    Treatment Plan Summary:  Generalized anxiety disorder Continue Luvox at 100 mg once daily. Continue therapy with Royal P[facility]  Bipolar 2 disorder Continue Lamictal at 100mg  po qd.  Insomnia Continue Trazodone at 100mg  po qhs.   Return to clinic in 3 months time or call before if needed.   Elvin So, MD 10/31/20199:47 AM

## 2018-08-26 ENCOUNTER — Encounter: Payer: Self-pay | Admitting: Licensed Clinical Social Worker

## 2018-08-26 ENCOUNTER — Ambulatory Visit: Payer: No Typology Code available for payment source | Admitting: Licensed Clinical Social Worker

## 2018-08-26 DIAGNOSIS — F331 Major depressive disorder, recurrent, moderate: Secondary | ICD-10-CM

## 2018-08-26 NOTE — Progress Notes (Signed)
   THERAPIST PROGRESS NOTE  Session Time: 0900  Participation Level: Active  Behavioral Response: NeatAlertNA  Type of Therapy: Individual Therapy  Treatment Goals addressed: Anxiety  Interventions: CBT  Summary: Victoria Simmons is a 22 y.o. female who presents with symptoms related to her diagnosis. She reports "feeling great, and had a wonderful trip with my cousin." Victoria Simmons reports having a good trip in [place], and recounted the events of her trip. She stated she started a kickboxing class with her father, which she states she has enjoyed. This led to a discussion around the ways exercise can improve mental health symptoms. Victoria Simmons expressed understanding and agreement with this idea. Victoria Simmons reports celebrating her birthday with one of her friends and her family, and reported "just feeling really positive." She stated she will leave for a cruise in one month with her entire extended family, which she reported being very excited about. Victoria Simmons reports work has been less stressful since they reduced her hours, and she is able to take time off for the holidays coming up as well.    Suicidal/Homicidal: No  Therapist Response: Victoria Simmons continues to show improvements with her anxiety and depression symptoms. Today, she presented with a bright affect and reports significant decrease in emotional dysregulation. We will continue to utilize CBT, but will move to monthly sessions--though LCSW explained Victoria Simmons can always be seen sooner if needed. Victoria Simmons expressed understanding and agreement with this idea.   Plan: Return again in 4 weeks.  Diagnosis: Axis I: MDD (recurrent, moderate)    Axis II: No diagnosis    [name], LCSW 08/26/2018

## 2018-09-08 MED FILL — lamoTRIgine 100 MG TABS: 100 | 90 days supply | Qty: 90 | Fill #2

## 2018-09-08 MED FILL — FLUVOXAMINE MALEATE 100 MG: 100 | 90 days supply | Qty: 90 | Fill #2

## 2018-09-09 ENCOUNTER — Ambulatory Visit: Payer: No Typology Code available for payment source | Admitting: Licensed Clinical Social Worker

## 2018-09-09 MED FILL — traZODone HCL 100 MG TABS: 100 | 90 days supply | Qty: 90 | Fill #0

## 2018-10-08 DIAGNOSIS — E781 Pure hyperglyceridemia: Secondary | ICD-10-CM

## 2018-10-08 HISTORY — DX: Pure hyperglyceridemia: E78.1

## 2018-11-06 ENCOUNTER — Ambulatory Visit: Payer: No Typology Code available for payment source | Admitting: Psychiatry

## 2018-12-08 ENCOUNTER — Ambulatory Visit (INDEPENDENT_AMBULATORY_CARE_PROVIDER_SITE_OTHER): Payer: No Typology Code available for payment source | Admitting: Psychiatry

## 2018-12-08 ENCOUNTER — Encounter: Payer: Self-pay | Admitting: Psychiatry

## 2018-12-08 ENCOUNTER — Other Ambulatory Visit: Payer: Self-pay

## 2018-12-08 ENCOUNTER — Ambulatory Visit (INDEPENDENT_AMBULATORY_CARE_PROVIDER_SITE_OTHER): Payer: No Typology Code available for payment source | Admitting: Obstetrics and Gynecology

## 2018-12-08 ENCOUNTER — Encounter: Payer: Self-pay | Admitting: Obstetrics and Gynecology

## 2018-12-08 VITALS — BP 98/60 | HR 0 | Resp 16 | Ht 65.0 in | Wt 155.4 lb

## 2018-12-08 VITALS — BP 100/67 | HR 82 | Temp 97.7°F | Wt 154.8 lb

## 2018-12-08 DIAGNOSIS — F5105 Insomnia due to other mental disorder: Secondary | ICD-10-CM

## 2018-12-08 DIAGNOSIS — Z23 Encounter for immunization: Secondary | ICD-10-CM | POA: Diagnosis not present

## 2018-12-08 DIAGNOSIS — Z113 Encounter for screening for infections with a predominantly sexual mode of transmission: Secondary | ICD-10-CM | POA: Diagnosis not present

## 2018-12-08 DIAGNOSIS — F3181 Bipolar II disorder: Secondary | ICD-10-CM | POA: Diagnosis not present

## 2018-12-08 DIAGNOSIS — Z01419 Encounter for gynecological examination (general) (routine) without abnormal findings: Secondary | ICD-10-CM | POA: Diagnosis not present

## 2018-12-08 DIAGNOSIS — F411 Generalized anxiety disorder: Secondary | ICD-10-CM | POA: Diagnosis not present

## 2018-12-08 MED ORDER — FLUVOXAMINE MALEATE 100 MG PO TABS: 100.0000 mg | ORAL_TABLET | Freq: Every day | ORAL | 0 refills | Status: DC

## 2018-12-08 MED ORDER — LAMOTRIGINE 25 MG PO TABS: 25.0000 mg | ORAL_TABLET | Freq: Every day | ORAL | 0 refills | Status: DC

## 2018-12-08 MED ORDER — TRAZODONE HCL 100 MG PO TABS: 100.0000 mg | ORAL_TABLET | Freq: Every day | ORAL | 0 refills | Status: DC

## 2018-12-08 MED ORDER — LAMOTRIGINE 100 MG PO TABS: ORAL_TABLET | ORAL | 0 refills | Status: DC

## 2018-12-08 MED FILL — FLUVOXAMINE MALEATE 100 MG: 100 | 90 days supply | Qty: 90 | Fill #0

## 2018-12-08 MED FILL — traZODone HCL 100 MG TABS: 100 | 90 days supply | Qty: 90 | Fill #0

## 2018-12-08 MED FILL — lamoTRIgine 25 MG TABS: 25 | 30 days supply | Qty: 30 | Fill #0

## 2018-12-08 MED FILL — lamoTRIgine 100 MG TABS: 100 | 90 days supply | Qty: 90 | Fill #0

## 2018-12-08 NOTE — Patient Instructions (Signed)

## 2018-12-08 NOTE — Progress Notes (Signed)
23 y.o. G0P0000 Single Caucasian female here for annual exam.    No further pain. Had a pelvic US in Oct. 2019 for pelvic pain.  IUD in correct position.  Left ovary slight increase in size and peripheral follicles seen.   No menses in Nov, Dec, and Jan.   Happy with her IUD.   Wants general labs.   Was sexually active with a man in the last year.   PCP:  Arnette Norris, MD  Patient's last menstrual period was 11/13/2018 (exact date).     Period Cycle (Days): (Only occ. cycle with Kyleena IUD)     Sexually active: Yes.   Female and female.  The current method of family planning is IUD--Kyleena 02/21/18.    Exercising: Yes.    High intensity training Smoker:  no  Health Maintenance: Pap: 11-21-17 Neg History of abnormal Pap:  no MMG:  n/a Colonoscopy: 09/2016 normal--done d/t N & V. BMD:   n/a  Result  n/a TDaP: ??04-30-08--Pt. unsure Gardasil:   no HIV: n/a Hep C: n/a Screening Labs:  ----    reports that she has never smoked. She has never used smokeless tobacco. She reports current alcohol use of about 5.0 standard drinks of alcohol per week. She reports that she does not use drugs.  Past Medical History:  Diagnosis Date  . Anxiety   . Depression   . Dysmenorrhea   . Ovarian torsion 2008    Past Surgical History:  Procedure Laterality Date  . OVARY SURGERY  06/30/2007   dx laparoscopy, reduction of right adnexal torsion, bilateral oophoropexy of each ovary to posterior uterine fundus    Current Outpatient Medications  Medication Sig Dispense Refill  . fluvoxaMINE (LUVOX) 100 MG tablet Take 1 tablet (100 mg total) by mouth at bedtime. 90 tablet 0  . ibuprofen (ADVIL,MOTRIN) 800 MG tablet Take 1 tablet (800 mg total) by mouth every 8 (eight) hours as needed. 30 tablet 2  . lamoTRIgine (LAMICTAL) 100 MG tablet To be combined with 25 mg 90 tablet 0  . lamoTRIgine (LAMICTAL) 25 MG tablet Take 1 tablet (25 mg total) by mouth daily. To be combined with 100 mg 30 tablet 0   . Levonorgestrel (KYLEENA IU) by Intrauterine route.    . traZODone (DESYREL) 100 MG tablet Take 1 tablet (100 mg total) by mouth at bedtime. 90 tablet 0   No current facility-administered medications for this visit.     Family History  Problem Relation Age of Onset  . Hepatitis B Mother   . Anxiety disorder Mother   . Hypertension Father   . ADD / ADHD Brother   . Ovarian cancer Paternal Grandmother     Review of Systems  All other systems reviewed and are negative.   Exam:   BP 98/60 (BP Location: Right Arm, Patient Position: Sitting, Cuff Size: Normal)   Pulse (!) 0   Resp 16   Ht 5\' 5"  (1.651 m)   Wt 155 lb 6.4 oz (70.5 kg)   LMP 11/13/2018 (Exact Date)   BMI 25.86 kg/m     General appearance: alert, cooperative and appears stated age Head: Normocephalic, without obvious abnormality, atraumatic Neck: no adenopathy, supple, symmetrical, trachea midline and thyroid normal to inspection and palpation Lungs: clear to auscultation bilaterally Breasts: normal appearance, no masses or tenderness, No nipple retraction or dimpling, No nipple discharge or bleeding, No axillary or supraclavicular adenopathy Heart: regular rate and rhythm Abdomen: soft, non-tender; no masses, no organomegaly Extremities: extremities normal,  atraumatic, no cyanosis or edema Skin: Skin color, texture, turgor normal. No rashes or lesions Lymph nodes: Cervical, supraclavicular, and axillary nodes normal. No abnormal inguinal nodes palpated Neurologic: Grossly normal  Pelvic: External genitalia:  no lesions              Urethra:  normal appearing urethra with no masses, tenderness or lesions              Bartholins and Skenes: normal                 Vagina: normal appearing vagina with normal color and discharge, no lesions              Cervix: no lesions. IUD strings noted.               Pap taken: No. Bimanual Exam:  Uterus:  normal size, contour, position, consistency, mobility, non-tender               Adnexa: no mass, fullness, tenderness            Chaperone was present for exam.  Assessment:   Well woman visit with normal exam. On Lamictal. Status post laparoscopic surgery for ovarian torsion.  Kyleena IUD.   Plan: Mammogram screening. Recommended self breast awareness. Pap and HR HPV as above. Guidelines for Calcium, Vitamin D, regular exercise program including cardiovascular and weight bearing exercise. Discussed Gardasil.  Will start today.  STD screening today.  Routine labs.  Follow up annually and prn.   After visit summary provided.

## 2018-12-08 NOTE — Progress Notes (Signed)
[facility] MD OP Progress Note  12/08/2018 5:54 PM Victoria Simmons  MRN:  [id number]  Chief Complaint: 'I am here for follow up' Chief Complaint    Follow-up; Medication Refill     HPI: Victoria Simmons is a 23 yr old Caucasian female, employed, lives in [place], has a history of bipolar disorder, GAD, insomnia, presented to clinic today for a follow-up visit.  Patient used to follow-up with Dr. Einar Grad here in clinic.  Patient today reports she has been experiencing some anxiety as well as mood lability recently.  She reports her medications were initially helpful but it looks like she may be getting used to them.  She reports she is tolerating the medications well.  She denies any side effects.  Patient reports she also struggles with sleep.  She reports the trazodone helps to some extent however she has difficulty falling asleep and also has interrupted sleep.  She used to be on melatonin prior to the trazodone which helped for a while and then stopped working.  Patient reports work is going well.  Patient reports she has good support system from her parents.  Patient denies any suicidality, homicidality or perceptual disturbances.  Patient has upcoming appointment with Ms. [name] therapist here in clinic. Visit Diagnosis:    ICD-10-CM   1. Bipolar 2 disorder, major depressive episode (HCC) F31.81 traZODone (DESYREL) 100 MG tablet    lamoTRIgine (LAMICTAL) 100 MG tablet    lamoTRIgine (LAMICTAL) 25 MG tablet  2. GAD (generalized anxiety disorder) F41.1 fluvoxaMINE (LUVOX) 100 MG tablet    traZODone (DESYREL) 100 MG tablet  3. Insomnia due to mental condition F51.05     Past Psychiatric History: Patient reports inpatient mental health admission at [facility] in 2018.  Patient denies suicide attempts.  Patient reports past trials of Zoloft and Prozac  Past Medical History:  Past Medical History:  Diagnosis Date  . Anxiety   . Depression   . Dysmenorrhea   . Ovarian torsion 2008    Past Surgical  History:  Procedure Laterality Date  . OVARY SURGERY  06/30/2007   dx laparoscopy, reduction of right adnexal torsion, bilateral oophoropexy of each ovary to posterior uterine fundus    Family Psychiatric History: As noted below.  Family History:  Family History  Problem Relation Age of Onset  . Hepatitis B Mother   . Anxiety disorder Mother   . Hypertension Father   . ADD / ADHD Brother   . Ovarian cancer Paternal Grandmother     Social History: Patient is single.  She went to [employer] for a while however had medical problems and had to drop out.  She currently works full-time at [employer].  She lives in [place].  She has good support system from her family. Social History   Socioeconomic History  . Marital status: Single    Spouse name: Not on file  . Number of children: 0  . Years of education: Not on file  . Highest education level: Some college, no degree  Occupational History  . 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 Financial resource strain: Not hard at all  . Food insecurity:    Worry: Never true    Inability: Never true  . Transportation needs:    Medical: No    Non-medical: No  Tobacco Use  . Smoking status: Never Smoker  . Smokeless tobacco: Never Used  Substance and Sexual Activity  . Alcohol use: Yes  Alcohol/week: 5.0 standard drinks    Types: 5 Glasses of wine per week  . Drug use: No  . Sexual activity: Yes    Birth control/protection: I.U.D.    Comment: Kyleena inserted 02/21/18---Female partner  Lifestyle  . Physical activity:    Days per week: 5 days    Minutes per session: 60 min  . Stress: Not at all  Relationships  . Social connections:    Talks on phone: Three times a week    Gets together: More than three times a week    Attends religious service: Never    Active member of club or organization: No    Attends meetings of clubs or organizations: Never    Relationship status: Never married  Other Topics  Concern  . Not on file  Social History Narrative   ** Merged History Encounter **        Allergies: No Known Allergies  Metabolic Disorder Labs: Lab Results  Component Value Date   HGBA1C 4.9 05/14/2017   MPG 94 05/14/2017   No results found for: PROLACTIN Lab Results  Component Value Date   CHOL 174 11/21/2017   TRIG 128 11/21/2017   HDL 46 11/21/2017   CHOLHDL 3.8 11/21/2017   VLDL 30 05/14/2017   LDLCALC 102 (H) 11/21/2017   LDLCALC 163 (H) 05/14/2017   Lab Results  Component Value Date   TSH 3.491 05/14/2017   TSH 0.76 01/01/2013    Therapeutic Level Labs: No results found for: LITHIUM No results found for: VALPROATE No components found for:  CBMZ  Current Medications: Current Outpatient Medications  Medication Sig Dispense Refill  . fluvoxaMINE (LUVOX) 100 MG tablet Take 1 tablet (100 mg total) by mouth at bedtime. 90 tablet 0  . ibuprofen (ADVIL,MOTRIN) 800 MG tablet Take 1 tablet (800 mg total) by mouth every 8 (eight) hours as needed. 30 tablet 2  . lamoTRIgine (LAMICTAL) 100 MG tablet To be combined with 25 mg 90 tablet 0  . Levonorgestrel (KYLEENA IU) by Intrauterine route.    . traZODone (DESYREL) 100 MG tablet Take 1 tablet (100 mg total) by mouth at bedtime. 90 tablet 0  . lamoTRIgine (LAMICTAL) 25 MG tablet Take 1 tablet (25 mg total) by mouth daily. To be combined with 100 mg 30 tablet 0   No current facility-administered medications for this visit.      Musculoskeletal: Strength & Muscle Tone: within normal limits Gait & Station: normal Patient leans: N/A  Psychiatric Specialty Exam: Review of Systems  Psychiatric/Behavioral: The patient is nervous/anxious and has insomnia.   All other systems reviewed and are negative.   Blood pressure 100/67, pulse 82, temperature 97.7 F (36.5 C), temperature source Oral, weight 154 lb 12.8 oz (70.2 kg), last menstrual period 11/13/2018.Body mass index is 25.76 kg/m.  General Appearance: Casual  Eye  Contact:  Fair  Speech:  Clear and Coherent  Volume:  Normal  Mood:  Anxious  Affect:  Congruent  Thought Process:  Goal Directed and Descriptions of Associations: Intact  Orientation:  Full (Time, Place, and Person)  Thought Content: Logical   Suicidal Thoughts:  No  Homicidal Thoughts:  No  Memory:  Immediate;   Fair Recent;   Fair Remote;   Fair  Judgement:  Fair  Insight:  Fair  Psychomotor Activity:  Normal  Concentration:  Concentration: Fair and Attention Span: Fair  Recall:  [employer] of Knowledge: Fair  Language: Fair  Akathisia:  No  Handed:  Right  AIMS (if indicated): denies tremors, rigidity,stiffness  Assets:  Communication Skills Desire for Improvement Social Support  ADL's:  Intact  Cognition: WNL  Sleep:  Poor   Screenings: AIMS     Admission (Discharged) from 05/13/2017 in [place] Total Score  0    AUDIT     Admission (Discharged) from 05/13/2017 in [place]  Alcohol Use Disorder Identification Test Final Score (AUDIT)  0    PHQ2-9     Office Visit from 06/19/2017 in [place] at [facility]  PHQ-2 Total Score  0  PHQ-9 Total Score  4       Assessment and Plan: Victoria Simmons is a 23 yr old Caucasian female who is single, employed, lives in [place], has a history of bipolar disorder, generalized anxiety disorder, presented to clinic today for a follow-up visit.  Patient used to see Dr. Einar Grad here in clinic.  Patient reports mood lability, sleep problems.  Patient will benefit from medication changes.  Plan as noted below.  Plan Bipolar 2 disorder-unstable Increase lamotrigine to 125 mg p.o. daily. Continue Luvox 100 mg p.o. daily  For generalized anxiety disorder-unstable Continue CBT she has upcoming appointment with therapist here in clinic. Continue Luvox 100 mg p.o. daily  For insomnia-unstable Continue trazodone 100 mg p.o. nightly Discussed with patient to add melatonin to the  trazodone for the next 1 week.  If melatonin and trazodone combination does not work advised her to contact the clinic for further management of her sleep problems.  I have reviewed medical records in [facility], most recent one per Dr. Einar Grad noted 08/07/2018-' at that time patient reported she was doing well.  She had started therapy sessions with Ms. [name].  She was advised to continue Lamictal and trazodone.'  Discussed with patient to get labs-TSH.  Patient advised to continue psychotherapy sessions with Ms. [name], she has upcoming appointment.  Patient to follow-up in clinic with Dr. Einar Grad in 2 to 3 weeks or sooner if needed.  I have spent atleast 25 minutes face to face with patient today. More than 50 % of the time was spent for psychoeducation and supportive psychotherapy and care coordination.  This note was generated in part or whole with voice recognition software. Voice recognition is usually quite accurate but there are transcription errors that can and very often do occur. I apologize for any typographical errors that were not detected and corrected.       Ursula Alert, MD 12/08/2018, 5:54 PM

## 2018-12-09 LAB — COMPREHENSIVE METABOLIC PANEL
ALT: 7 IU/L (ref 0–32)
AST: 10 IU/L (ref 0–40)
Albumin/Globulin Ratio: 1.9 (ref 1.2–2.2)
Albumin: 4.5 g/dL (ref 3.9–5.0)
Alkaline Phosphatase: 81 IU/L (ref 39–117)
BUN/Creatinine Ratio: 11 (ref 9–23)
BUN: 11 mg/dL (ref 6–20)
Bilirubin Total: <0.2 mg/dL (ref 0.0–1.2)
CO2: 26 mmol/L (ref 20–29)
Calcium: 9.3 mg/dL (ref 8.7–10.2)
Chloride: 101 mmol/L (ref 96–106)
Creatinine, Ser: 0.99 mg/dL (ref 0.57–1.00)
GFR calc Af Amer: 94 mL/min/{1.73_m2} (ref >59)
GFR calc non Af Amer: 81 mL/min/{1.73_m2} (ref >59)
Globulin, Total: 2.4 g/dL (ref 1.5–4.5)
Glucose: 80 mg/dL (ref 65–99)
Potassium: 4.8 mmol/L (ref 3.5–5.2)
Sodium: 139 mmol/L (ref 134–144)
Total Protein: 6.9 g/dL (ref 6.0–8.5)

## 2018-12-09 LAB — HEP, RPR, HIV PANEL
HIV Screen 4th Generation wRfx: NONREACTIVE
Hepatitis B Surface Ag: NEGATIVE
RPR Ser Ql: NONREACTIVE

## 2018-12-09 LAB — CBC
Hematocrit: 38.6 % (ref 34.0–46.6)
Hemoglobin: 12.9 g/dL (ref 11.1–15.9)
MCH: 28.1 pg (ref 26.6–33.0)
MCHC: 33.4 g/dL (ref 31.5–35.7)
MCV: 84 fL (ref 79–97)
Platelets: 280 10*3/uL (ref 150–450)
RBC: 4.59 x10E6/uL (ref 3.77–5.28)
RDW: 13.5 % (ref 11.7–15.4)
WBC: 7.3 10*3/uL (ref 3.4–10.8)

## 2018-12-09 LAB — LIPID PANEL
Chol/HDL Ratio: 3.7 ratio (ref 0.0–4.4)
Cholesterol, Total: 149 mg/dL (ref 100–199)
HDL: 40 mg/dL (ref >39)
LDL Calculated: 73 mg/dL (ref 0–99)
Triglycerides: 182 mg/dL — ABNORMAL HIGH (ref 0–149)
VLDL Cholesterol Cal: 36 mg/dL (ref 5–40)

## 2018-12-09 LAB — TSH: TSH: 0.506 u[IU]/mL (ref 0.450–4.500)

## 2018-12-09 LAB — HEPATITIS C ANTIBODY: Hep C Virus Ab: <0.1 s/co ratio (ref 0.0–0.9)

## 2018-12-10 LAB — CHLAMYDIA/GONOCOCCUS/TRICHOMONAS, NAA
Chlamydia by NAA: NEGATIVE
Gonococcus by NAA: NEGATIVE
Trich vag by NAA: NEGATIVE

## 2018-12-12 ENCOUNTER — Encounter: Payer: Self-pay | Admitting: Obstetrics and Gynecology

## 2018-12-19 ENCOUNTER — Ambulatory Visit
Admission: EM | Admit: 2018-12-19 | Discharge: 2018-12-19 | Disposition: A | Discharge status: Home / Self Care | Payer: No Typology Code available for payment source | Attending: Physician Assistant | Admitting: Physician Assistant

## 2018-12-19 DIAGNOSIS — J0140 Acute pansinusitis, unspecified: Secondary | ICD-10-CM | POA: Diagnosis not present

## 2018-12-19 DIAGNOSIS — B9789 Other viral agents as the cause of diseases classified elsewhere: Secondary | ICD-10-CM

## 2018-12-19 DIAGNOSIS — J209 Acute bronchitis, unspecified: Secondary | ICD-10-CM

## 2018-12-19 MED ORDER — DOXYCYCLINE HYCLATE 100 MG PO CAPS: 100.0000 mg | ORAL_CAPSULE | Freq: Two times a day (BID) | ORAL | 0 refills | Status: DC

## 2018-12-19 MED ORDER — IPRATROPIUM BROMIDE 0.06 % NA SOLN: 2.0000 | Freq: Four times a day (QID) | NASAL | 12 refills | Status: DC

## 2018-12-19 MED ORDER — PREDNISONE 50 MG PO TABS: 50.0000 mg | ORAL_TABLET | Freq: Every day | ORAL | 0 refills | Status: DC

## 2018-12-19 MED FILL — IPRATROPIUM 0.06% SPRAY: 0.06 | 10 days supply | Qty: 15 | Fill #0

## 2018-12-19 MED FILL — DOXYCYCLINE HYC 100 MG CAPS: 100 | 10 days supply | Qty: 20 | Fill #0

## 2018-12-19 MED FILL — predniSONE 50 MG TABS: 50 | 5 days supply | Qty: 5 | Fill #0

## 2018-12-19 NOTE — Discharge Instructions (Signed)
Doxycycline and prednisone as directed. Start atrovent nasal spray for nasal congestion/drainage. You can use over the counter nasal saline rinse such as neti pot for nasal congestion. Keep hydrated, your urine should be clear to pale yellow in color. Tylenol/motrin for fever and pain. Monitor for any worsening of symptoms, chest pain, shortness of breath, wheezing, swelling of the throat, follow up for reevaluation.   For sore throat/cough try using a honey-based tea. Use 3 teaspoons of honey with juice squeezed from half lemon. Place shaved pieces of ginger into 1/2-1 cup of water and warm over stove top. Then mix the ingredients and repeat every 4 hours as needed.

## 2018-12-19 NOTE — ED Provider Notes (Signed)
[facility]    CSN: [id number] Arrival date & time: 12/19/18  1003     History   Chief Complaint Chief Complaint  Patient presents with  . Cough    HPI Victoria Simmons is a 23 y.o. female.   23 year old female comes in for 4-week history of URI symptoms.  States started out as nasal congestion and rhinorrhea and has been gradually worsening with sore throat and cough.  She now feels short of breath where she feels as if she cannot take a deep breath.  Has had some wheezing.  Denies history of inhaler use.  Denies fever, chills, night sweats.  Never smoker.  No travels.  OTC cold medication without relief.  Works at a daycare.     Past Medical History:  Diagnosis Date  . Anxiety   . Depression   . Dysmenorrhea   . Hypertriglyceridemia 2020  . Ovarian torsion 2008    Patient Active Problem List   Diagnosis Date Noted  . Bilateral impacted cerumen 04/29/2018  . Acute non-recurrent frontal sinusitis 11/03/2017  . Well woman exam 06/19/2017  . MDD (major depressive disorder) 06/19/2017  . HLD (hyperlipidemia) 06/19/2017  . Bipolar 2 disorder, major depressive episode ([place]) 05/13/2017  . Generalized anxiety disorder 05/13/2017  . Menorrhagia 01/25/2014  . Anxiety state 01/26/2013  . Family history of hypercholesterolemia 01/06/2013  . Murmur 01/06/2013    Past Surgical History:  Procedure Laterality Date  . OVARY SURGERY  06/30/2007   dx laparoscopy, reduction of right adnexal torsion, bilateral oophoropexy of each ovary to posterior uterine fundus    OB History    Gravida  0   Para  0   Term  0   Preterm  0   AB  0   Living  0     SAB  0   TAB  0   Ectopic  0   Multiple  0   Live Births  0            Home Medications    Prior to Admission medications   Medication Sig Start Date End Date Taking? Authorizing Provider  doxycycline (VIBRAMYCIN) 100 MG capsule Take 1 capsule (100 mg total) by mouth 2 (two) times daily. 12/19/18    Tasia Catchings, Oyindamola Key V, PA-C  fluvoxaMINE (LUVOX) 100 MG tablet Take 1 tablet (100 mg total) by mouth at bedtime. 12/08/18   Ursula Alert, MD  ibuprofen (ADVIL,MOTRIN) 800 MG tablet Take 1 tablet (800 mg total) by mouth every 8 (eight) hours as needed. 12/05/17   Yisroel Ramming, Brook E, MD  ipratropium (ATROVENT) 0.06 % nasal spray Place 2 sprays into both nostrils 4 (four) times daily. 12/19/18   Ok Edwards, PA-C  lamoTRIgine (LAMICTAL) 100 MG tablet To be combined with 25 mg 12/08/18   Ursula Alert, MD  lamoTRIgine (LAMICTAL) 25 MG tablet Take 1 tablet (25 mg total) by mouth daily. To be combined with 100 mg 12/08/18   Ursula Alert, MD  Levonorgestrel (KYLEENA IU) by Intrauterine route. 02/21/18 02/22/23  [provider]  predniSONE (DELTASONE) 50 MG tablet Take 1 tablet (50 mg total) by mouth daily with breakfast. 12/19/18   Tasia Catchings, Regis Hinton V, PA-C  traZODone (DESYREL) 100 MG tablet Take 1 tablet (100 mg total) by mouth at bedtime. 12/08/18   Ursula Alert, MD    Family History Family History  Problem Relation Age of Onset  . Hepatitis B Mother   . Anxiety disorder Mother   . Hypertension  Father   . ADD / ADHD Brother   . Ovarian cancer Paternal Grandmother     Social History Social History   Tobacco Use  . Smoking status: Never Smoker  . Smokeless tobacco: Never Used  Substance Use Topics  . Alcohol use: Yes    Alcohol/week: 5.0 standard drinks    Types: 5 Glasses of wine per week  . Drug use: No     Allergies   Patient has no known allergies.   Review of Systems Review of Systems  Reason unable to perform ROS: See HPI as above.     Physical Exam Triage Vital Signs ED Triage Vitals  Enc Vitals Group     BP 12/19/18 1015 133/84     Pulse Rate 12/19/18 1015 66     Resp 12/19/18 1015 18     Temp 12/19/18 1015 97.9 F (36.6 C)     Temp Source 12/19/18 1015 Oral     SpO2 12/19/18 1015 96 %     Weight --      Height --      Head Circumference --      Peak Flow --       Pain Score 12/19/18 1016 8     Pain Loc --      Pain Edu? --      Excl. in [place]? --    No data found.  Updated Vital Signs BP 133/84 (BP Location: Left Arm)   Pulse 66   Temp 97.9 F (36.6 C) (Oral)   Resp 18   SpO2 96%   Physical Exam Constitutional:      General: She is not in acute distress.    Appearance: She is well-developed. She is not ill-appearing, toxic-appearing or diaphoretic.  HENT:     Head: Normocephalic and atraumatic.     Right Ear: Tympanic membrane, ear canal and external ear normal. Tympanic membrane is not erythematous or bulging.     Left Ear: Tympanic membrane, ear canal and external ear normal. Tympanic membrane is not erythematous or bulging.     Nose: Congestion and rhinorrhea present.     Right Sinus: Maxillary sinus tenderness and frontal sinus tenderness present.     Left Sinus: Maxillary sinus tenderness and frontal sinus tenderness present.     Mouth/Throat:     Mouth: Mucous membranes are moist.     Pharynx: Oropharynx is clear. Uvula midline.  Eyes:     Conjunctiva/sclera: Conjunctivae normal.     Pupils: Pupils are equal, round, and reactive to light.  Neck:     Musculoskeletal: Normal range of motion and neck supple.  Cardiovascular:     Rate and Rhythm: Normal rate and regular rhythm.     Heart sounds: Normal heart sounds. No murmur. No friction rub. No gallop.   Pulmonary:     Effort: Pulmonary effort is normal. No accessory muscle usage, prolonged expiration, respiratory distress or retractions.     Breath sounds: Normal breath sounds. No stridor, decreased air movement or transmitted upper airway sounds. No decreased breath sounds, wheezing, rhonchi or rales.  Skin:    General: Skin is warm and dry.  Neurological:     Mental Status: She is alert and oriented to person, place, and time.      UC Treatments / Results  Labs (all labs ordered are listed, but only abnormal results are displayed) Labs Reviewed - No data to display   EKG None  Radiology No results found.  Procedures Procedures (including  critical care time)  Medications Ordered in UC Medications - No data to display  Initial Impression / Assessment and Plan / UC Course  I have reviewed the triage vital signs and the nursing notes.  Pertinent labs & imaging results that were available during my care of the patient were reviewed by me and considered in my medical decision making (see chart for details).    Doxycycline and prednisone for sinusitis and bronchitis.  Other symptomatic treatment discussed.  Push fluids.  Return precautions given.  Patient expresses understanding and agrees to plan.  Final Clinical Impressions(s) / UC Diagnoses   Final diagnoses:  Acute non-recurrent pansinusitis  Acute bronchitis, unspecified organism   ED Prescriptions    Medication Sig Dispense Auth. Provider   predniSONE (DELTASONE) 50 MG tablet Take 1 tablet (50 mg total) by mouth daily with breakfast. 5 tablet Aveen Stansel V, PA-C   doxycycline (VIBRAMYCIN) 100 MG capsule Take 1 capsule (100 mg total) by mouth 2 (two) times daily. 20 capsule Lamark Schue V, PA-C   ipratropium (ATROVENT) 0.06 % nasal spray Place 2 sprays into both nostrils 4 (four) times daily. 15 mL Tobin Chad, [place] 12/19/18 1030

## 2018-12-19 NOTE — ED Triage Notes (Signed)
Pt c/o cough, sore throat, and nasal congestion for a month

## 2018-12-22 ENCOUNTER — Ambulatory Visit: Payer: No Typology Code available for payment source | Admitting: Psychiatry

## 2018-12-23 ENCOUNTER — Encounter: Payer: Self-pay | Admitting: Licensed Clinical Social Worker

## 2018-12-23 ENCOUNTER — Ambulatory Visit (INDEPENDENT_AMBULATORY_CARE_PROVIDER_SITE_OTHER): Payer: No Typology Code available for payment source | Admitting: Psychiatry

## 2018-12-23 ENCOUNTER — Ambulatory Visit: Payer: No Typology Code available for payment source | Admitting: Licensed Clinical Social Worker

## 2018-12-23 ENCOUNTER — Other Ambulatory Visit: Payer: Self-pay

## 2018-12-23 DIAGNOSIS — F5105 Insomnia due to other mental disorder: Secondary | ICD-10-CM | POA: Diagnosis not present

## 2018-12-23 DIAGNOSIS — F411 Generalized anxiety disorder: Secondary | ICD-10-CM | POA: Diagnosis not present

## 2018-12-23 DIAGNOSIS — F3181 Bipolar II disorder: Secondary | ICD-10-CM

## 2018-12-23 MED ORDER — ARIPIPRAZOLE 5 MG PO TABS: 10.0000 mg | ORAL_TABLET | Freq: Every day | ORAL | 0 refills | Status: DC

## 2018-12-23 MED ORDER — TRAZODONE HCL 150 MG PO TABS: 150.0000 mg | ORAL_TABLET | Freq: Every day | ORAL | 1 refills | Status: DC

## 2018-12-23 MED FILL — ARIPiprazole 5 MG TABS: 5 | 15 days supply | Qty: 30 | Fill #0

## 2018-12-23 MED FILL — traZODone HCL 150 MG TABS: 150 | 30 days supply | Qty: 30 | Fill #0 | Status: TO

## 2018-12-23 NOTE — Progress Notes (Signed)
[facility] OP Progress Note  12/23/2018 10:19 AM Victoria Simmons  MRN:  [id number]  Chief Complaint- Not doing good, very anxious  HPI: Victoria Simmons is a 23 yr old Caucasian female, employed, lives in [place], has a history of bipolar disorder, GAD, insomnia, presented to clinic today for a follow-up visit. She reports not doing well. Feeling very anxious, numb. Having suicidal thoughts of banging her head against a wall, cutting her wrist with a razor. States she will not act on these thoughts, they have been there since January. Reports feeling emotional, and irritable, moods all over the place. Patient reports that she saw Dr.Eappen at the beginning of the month and the lamictal was increased to 125mg , states it was not helpful.  Feeling down and moody since January. She is not sleeping well.  Patient reports work is going well.  Patient reports she has good support system from her parents.  Visit Diagnosis:    ICD-10-CM   1. Bipolar 2 disorder, major depressive episode (HCC) F31.81 traZODone (DESYREL) 150 MG tablet  2. GAD (generalized anxiety disorder) F41.1 traZODone (DESYREL) 150 MG tablet  3. Insomnia due to mental condition F51.05     Past Psychiatric History: Patient reports inpatient mental health admission at [facility] in 2018.  Patient denies suicide attempts.  Patient reports past trials of Zoloft and Prozac  Past Medical History:  Past Medical History:  Diagnosis Date  . Anxiety   . Depression   . Dysmenorrhea   . Hypertriglyceridemia 2020  . Ovarian torsion 2008    Past Surgical History:  Procedure Laterality Date  . OVARY SURGERY  06/30/2007   dx laparoscopy, reduction of right adnexal torsion, bilateral oophoropexy of each ovary to posterior uterine fundus    Family Psychiatric History: As noted below.  Family History:  Family History  Problem Relation Age of Onset  . Hepatitis B Mother   . Anxiety disorder Mother   . Hypertension Father   . ADD / ADHD Brother   .  Ovarian cancer Paternal Grandmother     Social History: Patient is single.  She went to [employer] for a while however had medical problems and had to drop out.  She currently works full-time at [employer].  She lives in [place].  She has good support system from her family. Social History   Socioeconomic History  . Marital status: Single    Spouse name: Not on file  . Number of children: 0  . Years of education: Not on file  . Highest education level: Some college, no degree  Occupational History  . 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 Financial resource strain: Not hard at all  . Food insecurity:    Worry: Never true    Inability: Never true  . Transportation needs:    Medical: No    Non-medical: No  Tobacco Use  . Smoking status: Never Smoker  . Smokeless tobacco: Never Used  Substance and Sexual Activity  . Alcohol use: Yes    Alcohol/week: 5.0 standard drinks    Types: 5 Glasses of wine per week  . Drug use: No  . Sexual activity: Yes    Birth control/protection: I.U.D.    Comment: Kyleena inserted 02/21/18---Female partner  Lifestyle  . Physical activity:    Days per week: 5 days    Minutes per session: 60 min  . Stress: Not at all  Relationships  . Social connections:    Talks  on phone: Three times a week    Gets together: More than three times a week    Attends religious service: Never    Active member of club or organization: No    Attends meetings of clubs or organizations: Never    Relationship status: Never married  Other Topics Concern  . Not on file  Social History Narrative   ** Merged History Encounter **        Allergies: No Known Allergies  Metabolic Disorder Labs: Lab Results  Component Value Date   HGBA1C 4.9 05/14/2017   MPG 94 05/14/2017   No results found for: PROLACTIN Lab Results  Component Value Date   CHOL 149 12/08/2018   TRIG 182 (H) 12/08/2018   HDL 40 12/08/2018   CHOLHDL 3.7 12/08/2018   VLDL  30 05/14/2017   LDLCALC 73 12/08/2018   LDLCALC 102 (H) 11/21/2017   Lab Results  Component Value Date   TSH 0.506 12/08/2018   TSH 3.491 05/14/2017    Therapeutic Level Labs: No results found for: LITHIUM No results found for: VALPROATE No components found for:  CBMZ  Current Medications: Current Outpatient Medications  Medication Sig Dispense Refill  . ARIPiprazole (ABILIFY) 5 MG tablet Take 2 tablets (10 mg total) by mouth daily. 30 tablet 0  . doxycycline (VIBRAMYCIN) 100 MG capsule Take 1 capsule (100 mg total) by mouth 2 (two) times daily. 20 capsule 0  . fluvoxaMINE (LUVOX) 100 MG tablet Take 1 tablet (100 mg total) by mouth at bedtime. 90 tablet 0  . ibuprofen (ADVIL,MOTRIN) 800 MG tablet Take 1 tablet (800 mg total) by mouth every 8 (eight) hours as needed. 30 tablet 2  . ipratropium (ATROVENT) 0.06 % nasal spray Place 2 sprays into both nostrils 4 (four) times daily. 15 mL 12  . lamoTRIgine (LAMICTAL) 100 MG tablet To be combined with 25 mg 90 tablet 0  . lamoTRIgine (LAMICTAL) 25 MG tablet Take 1 tablet (25 mg total) by mouth daily. To be combined with 100 mg 30 tablet 0  . Levonorgestrel (KYLEENA IU) by Intrauterine route.    . predniSONE (DELTASONE) 50 MG tablet Take 1 tablet (50 mg total) by mouth daily with breakfast. 5 tablet 0  . traZODone (DESYREL) 150 MG tablet Take 1 tablet (150 mg total) by mouth at bedtime. 30 tablet 1   No current facility-administered medications for this visit.      Musculoskeletal: Strength & Muscle Tone: within normal limits Gait & Station: normal Patient leans: N/A  Psychiatric Specialty Exam: Review of Systems  Psychiatric/Behavioral: The patient is nervous/anxious and has insomnia.   All other systems reviewed and are negative.   There were no vitals taken for this visit.There is no height or weight on file to calculate BMI.  General Appearance: Casual  Eye Contact:  Fair  Speech:  Clear and Coherent  Volume:  Normal   Mood:  Anxious  Affect:  Congruent  Thought Process:  Goal Directed and Descriptions of Associations: Intact  Orientation:  Full (Time, Place, and Person)  Thought Content: Logical   Suicidal Thoughts:  No  Homicidal Thoughts:  No  Memory:  Immediate;   Fair Recent;   Fair Remote;   Fair  Judgement:  Fair  Insight:  Fair  Psychomotor Activity:  Normal  Concentration:  Concentration: Fair and Attention Span: Fair  Recall:  [employer] of Knowledge: Fair  Language: Fair  Akathisia:  No  Handed:  Right  AIMS (if indicated): denies  tremors, rigidity,stiffness  Assets:  Communication Skills Desire for Improvement Social Support  ADL's:  Intact  Cognition: WNL  Sleep:  Poor   Screenings: AIMS     Admission (Discharged) from 05/13/2017 in [place] Total Score  0    AUDIT     Admission (Discharged) from 05/13/2017 in [place]  Alcohol Use Disorder Identification Test Final Score (AUDIT)  0    PHQ2-9     Office Visit from 06/19/2017 in [place] at [facility]  PHQ-2 Total Score  0  PHQ-9 Total Score  4       Assessment and Plan: Nevea is a 23 yr old Caucasian female who is single, employed, lives in [place], has a history of bipolar disorder, generalized anxiety disorder, presented to clinic today for a follow-up visit.  Patient used to see Dr. Einar Grad here in clinic.  Patient reports mood lability, sleep problems.  Patient will benefit from medication changes.  Plan as noted below.  Plan Bipolar 2 disorder-unstable Continue lamotrigine at 125 mg p.o. daily. Continue Luvox 100 mg p.o. daily Start Abilify at 5mg  po qd, Discussed side effects of metabolic disturbances and long term movement disorders. Patient gave verbal consent.  For generalized anxiety disorder-unstable Continue CBT she has upcoming appointment with therapist here in clinic. Continue Luvox 100 mg p.o. daily  For insomnia-unstable Increase   Trazodone to 150mg  p.o. nightly'  Discussed with patient to get labs-TSH.  Patient advised to continue psychotherapy sessions with Ms. [name], she has upcoming appointment.  Patient to follow-up in clinic in 1 week. If she has suicidal thoughts that she wants to act upon, she is to reach out to family members, and call 911.  I have spent atleast 25 minutes face to face with patient today. More than 50 % of the time was spent for psychoeducation and supportive psychotherapy and care coordination.  This note was generated in part or whole with voice recognition software. Voice recognition is usually quite accurate but there are transcription errors that can and very often do occur. I apologize for any typographical errors that were not detected and corrected.       Elvin So, MD 12/23/2018, 10:19 AM

## 2018-12-23 NOTE — Progress Notes (Signed)
   THERAPIST PROGRESS NOTE  Session Time: [id number]  Participation Level: Active  Behavioral Response: Well GroomedAlertAnxious  Type of Therapy: Individual Therapy  Treatment Goals addressed: Anxiety  Interventions: CBT  Summary: Victoria Simmons is a 23 y.o. female who presents with continued symptoms of her diagnosis. She reports increased anxiety and depression since our last session. She reports having thoughts of, "what if I swerved my car into traffic, or what if I did this or that." Victoria Simmons reports no intent to act on any of these ideas, but stated she was startled by the thoughts. She states they have been going on since around January, and she discussed them with MD in the clinic. Victoria Simmons was able to contract for safety and reported she had no current SI. LCSW ensured Victoria Simmons has emergency contact information for the suicide hotline and encouraged her to make an appointment with LCSW were these thoughts to get more frequent. Victoria Simmons agreed with this information. LCSW encouraged Bayli to start naming her emotions and recognizing what she is feeling in an effort to begin processing her feelings more effecitvely. Victoria Simmons expressed agreement with this idea, and added she feels she has not been handling things well recently. She reports, "I think maybe I'm making excuses for other people and not really dealing with what's happening." LCSW validated that idea and encouraged Victoria Simmons to become more aware of those instances and how she can address them in the moment. Victoria Simmons reported agreement with that idea. LCSW also encouraged Victoria Simmons to begin practicing other coping mechanisms, such as writing, listening to music, and finding things to look forward to. Victoria Simmons expressed understanding and agreement with this idea.    Suicidal/Homicidal: No  Therapist Response: Victoria Simmons continues to work towards her tx goals but has not yet reached them. She is working on Microbiologist and affect management  moving forward. We will continue to utilize CBT to do this.   Plan: Return again in 2 weeks.  Diagnosis: Axis I: Bipolar 2 Disorder, major depressive episode    Axis II: No diagnosis    [name], LCSW 12/23/2018

## 2018-12-30 ENCOUNTER — Ambulatory Visit (INDEPENDENT_AMBULATORY_CARE_PROVIDER_SITE_OTHER): Payer: No Typology Code available for payment source | Admitting: Psychiatry

## 2018-12-30 ENCOUNTER — Encounter: Payer: Self-pay | Admitting: Psychiatry

## 2018-12-30 ENCOUNTER — Other Ambulatory Visit: Payer: Self-pay

## 2018-12-30 DIAGNOSIS — F3181 Bipolar II disorder: Secondary | ICD-10-CM | POA: Diagnosis not present

## 2018-12-30 DIAGNOSIS — F5105 Insomnia due to other mental disorder: Secondary | ICD-10-CM | POA: Diagnosis not present

## 2018-12-30 DIAGNOSIS — F411 Generalized anxiety disorder: Secondary | ICD-10-CM

## 2018-12-30 NOTE — Progress Notes (Signed)
[facility] OP Progress Note  12/30/2018 10:52 AM Special [name]  MRN:  [id number]  Chief Complaint- Not doing good, very anxious  HPI: Victoria Simmons is a 23 yr old Caucasian female, employed, lives in [place], has a history of bipolar disorder, GAD, insomnia, presented to clinic today for a follow-up visit. Patient was assessed through a phone consult due to the Covid-19 situation. She reports that she has started to take the Abilify and the increased dose of the trazodone. She reports sleeping much better. She feels her mood has improved, not as anxious, not as irritable. Continues to have passive suicidal thoughts, denies any active thoughts, denies any plans.   She does report feeling jittery in the mornings, taking Abilify at night. States the jitteriness lasts for few hours and it started since we started the Abilify. Discussed if the Fluvoxamine has been helpful with mood, patient unable to say. Her bronchitis has resolved. Patient reports work is going well.  Patient reports she has good support system from her parents.  Visit Diagnosis:    ICD-10-CM   1. Bipolar 2 disorder, major depressive episode ([place]) F31.81   2. GAD (generalized anxiety disorder) F41.1   3. Insomnia due to mental condition F51.05     Past Psychiatric History: Patient reports inpatient mental health admission at [facility] in 2018.  Patient denies suicide attempts.  Patient reports past trials of Zoloft and Prozac  Past Medical History:  Past Medical History:  Diagnosis Date  . Anxiety   . Depression   . Dysmenorrhea   . Hypertriglyceridemia 2020  . Ovarian torsion 2008    Past Surgical History:  Procedure Laterality Date  . OVARY SURGERY  06/30/2007   dx laparoscopy, reduction of right adnexal torsion, bilateral oophoropexy of each ovary to posterior uterine fundus    Family Psychiatric History: As noted below.  Family History:  Family History  Problem Relation Age of Onset  . Hepatitis B Mother   . Anxiety  disorder Mother   . Hypertension Father   . ADD / ADHD Brother   . Ovarian cancer Paternal Grandmother     Social History: Patient is single.  She went to [employer] for a while however had medical problems and had to drop out.  She currently works full-time at [employer].  She lives in [place].  She has good support system from her family. Social History   Socioeconomic History  . Marital status: Single    Spouse name: Not on file  . Number of children: 0  . Years of education: Not on file  . Highest education level: Some college, no degree  Occupational History  . 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 Financial resource strain: Not hard at all  . Food insecurity:    Worry: Never true    Inability: Never true  . Transportation needs:    Medical: No    Non-medical: No  Tobacco Use  . Smoking status: Never Smoker  . Smokeless tobacco: Never Used  Substance and Sexual Activity  . Alcohol use: Yes    Alcohol/week: 5.0 standard drinks    Types: 5 Glasses of wine per week  . Drug use: No  . Sexual activity: Yes    Birth control/protection: I.U.D.    Comment: Kyleena inserted 02/21/18---Female partner  Lifestyle  . Physical activity:    Days per week: 5 days    Minutes per session: 60 min  . Stress: Not at  all  Relationships  . Social connections:    Talks on phone: Three times a week    Gets together: More than three times a week    Attends religious service: Never    Active member of club or organization: No    Attends meetings of clubs or organizations: Never    Relationship status: Never married  Other Topics Concern  . Not on file  Social History Narrative   ** Merged History Encounter **        Allergies: No Known Allergies  Metabolic Disorder Labs: Lab Results  Component Value Date   HGBA1C 4.9 05/14/2017   MPG 94 05/14/2017   No results found for: PROLACTIN Lab Results  Component Value Date   CHOL 149 12/08/2018   TRIG 182  (H) 12/08/2018   HDL 40 12/08/2018   CHOLHDL 3.7 12/08/2018   VLDL 30 05/14/2017   LDLCALC 73 12/08/2018   LDLCALC 102 (H) 11/21/2017   Lab Results  Component Value Date   TSH 0.506 12/08/2018   TSH 3.491 05/14/2017    Therapeutic Level Labs: No results found for: LITHIUM No results found for: VALPROATE No components found for:  CBMZ  Current Medications: Current Outpatient Medications  Medication Sig Dispense Refill  . ARIPiprazole (ABILIFY) 5 MG tablet Take 2 tablets (10 mg total) by mouth daily. 30 tablet 0  . doxycycline (VIBRAMYCIN) 100 MG capsule Take 1 capsule (100 mg total) by mouth 2 (two) times daily. 20 capsule 0  . fluvoxaMINE (LUVOX) 100 MG tablet Take 1 tablet (100 mg total) by mouth at bedtime. 90 tablet 0  . ibuprofen (ADVIL,MOTRIN) 800 MG tablet Take 1 tablet (800 mg total) by mouth every 8 (eight) hours as needed. 30 tablet 2  . ipratropium (ATROVENT) 0.06 % nasal spray Place 2 sprays into both nostrils 4 (four) times daily. 15 mL 12  . lamoTRIgine (LAMICTAL) 100 MG tablet To be combined with 25 mg 90 tablet 0  . lamoTRIgine (LAMICTAL) 25 MG tablet Take 1 tablet (25 mg total) by mouth daily. To be combined with 100 mg 30 tablet 0  . Levonorgestrel (KYLEENA IU) by Intrauterine route.    . predniSONE (DELTASONE) 50 MG tablet Take 1 tablet (50 mg total) by mouth daily with breakfast. 5 tablet 0  . traZODone (DESYREL) 150 MG tablet Take 1 tablet (150 mg total) by mouth at bedtime. 30 tablet 1   No current facility-administered medications for this visit.      Musculoskeletal: Strength & Muscle Tone: within normal limits Gait & Station: normal Patient leans: N/A  Psychiatric Specialty Exam: Review of Systems  Psychiatric/Behavioral: The patient is nervous/anxious and has insomnia.   All other systems reviewed and are negative.   There were no vitals taken for this visit.There is no height or weight on file to calculate BMI.  General Appearance: Casual  Eye  Contact:  Fair  Speech:  Clear and Coherent  Volume:  Normal  Mood:  improved  Affect:  Congruent  Thought Process:  Goal Directed and Descriptions of Associations: Intact  Orientation:  Full (Time, Place, and Person)  Thought Content: Logical   Suicidal Thoughts:  No  Homicidal Thoughts:  No  Memory:  Immediate;   Fair Recent;   Fair Remote;   Fair  Judgement:  Fair  Insight:  Fair  Psychomotor Activity:  Normal  Concentration:  Concentration: Fair and Attention Span: Fair  Recall:  [employer] of Knowledge: Fair  Language: Fair  Akathisia:  Some jitteriness  Handed:  Right  AIMS (if indicated):   Assets:  Communication Skills Desire for Improvement Social Support  ADL's:  Intact  Cognition: WNL  Sleep:  better   Screenings: AIMS     Admission (Discharged) from 05/13/2017 in [place] Total Score  0    AUDIT     Admission (Discharged) from 05/13/2017 in [place]  Alcohol Use Disorder Identification Test Final Score (AUDIT)  0    PHQ2-9     Office Visit from 06/19/2017 in [place] at [facility]  PHQ-2 Total Score  0  PHQ-9 Total Score  4       Assessment and Plan: Victoria Simmons is a 23 yr old Caucasian female who is single, employed, lives in [place], has a history of bipolar disorder, generalized anxiety disorder, presented to clinic today for a follow-up visit.  Patient used to see Dr. Einar Grad here in clinic.  Patient reports mood lability, sleep problems.  Patient will benefit from medication changes.  Plan as noted below.  Plan Bipolar 2 disorder-unstable Continue lamotrigine at 125 mg p.o. daily. Decrease Luvox to 50 mg p.o. daily Continue Abilify at 5mg  po qd, Discussed side effects of metabolic disturbances and long term movement disorders. Patient gave verbal consent.  For generalized anxiety disorder-unstable Continue CBT. Medications above will be helpful in decreasing anxiety.  For  insomnia-improved Continue  Trazodone to 150mg  p.o. nightly'  Discussed with patient to get labs-TSH.  Patient advised to continue psychotherapy sessions with Ms. [name], she has upcoming appointment.  Patient to follow-up in clinic in 1 week. If she has suicidal thoughts that she wants to act upon, she is to reach out to family members, and call 911.  I have spent atleast 25 minutes face to face with patient today. More than 50 % of the time was spent for psychoeducation and supportive psychotherapy and care coordination.  This note was generated in part or whole with voice recognition software. Voice recognition is usually quite accurate but there are transcription errors that can and very often do occur. I apologize for any typographical errors that were not detected and corrected.       Elvin So, MD 12/30/2018, 10:52 AM

## 2019-01-07 ENCOUNTER — Telehealth: Payer: Self-pay

## 2019-01-07 NOTE — Telephone Encounter (Signed)
pt left a message that she needs a refill on her abilify

## 2019-01-08 ENCOUNTER — Encounter: Payer: Self-pay | Admitting: Psychiatry

## 2019-01-08 ENCOUNTER — Other Ambulatory Visit: Payer: Self-pay

## 2019-01-08 ENCOUNTER — Ambulatory Visit (INDEPENDENT_AMBULATORY_CARE_PROVIDER_SITE_OTHER): Payer: No Typology Code available for payment source | Admitting: Licensed Clinical Social Worker

## 2019-01-08 ENCOUNTER — Ambulatory Visit (INDEPENDENT_AMBULATORY_CARE_PROVIDER_SITE_OTHER): Payer: No Typology Code available for payment source | Admitting: Psychiatry

## 2019-01-08 ENCOUNTER — Encounter: Payer: Self-pay | Admitting: Licensed Clinical Social Worker

## 2019-01-08 DIAGNOSIS — F3181 Bipolar II disorder: Secondary | ICD-10-CM

## 2019-01-08 DIAGNOSIS — F411 Generalized anxiety disorder: Secondary | ICD-10-CM

## 2019-01-08 MED ORDER — ARIPIPRAZOLE 10 MG PO TABS: 10.0000 mg | ORAL_TABLET | Freq: Every day | ORAL | 1 refills | Status: DC

## 2019-01-08 MED FILL — ARIPiprazole 10 MG TABS: 10 | 30 days supply | Qty: 30 | Fill #0

## 2019-01-08 NOTE — Progress Notes (Signed)
TC on  01-08-19 medical and surgical hx was reviewed with no changes, medication and pharmacy was reviewed with no changes. Pt stated she would need medication refills. Vital were not done due to this visit is a phone consult.
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
## 2019-01-08 NOTE — Telephone Encounter (Signed)
faxed and confirmed rx fro abilify 10mg  id #[id number] order # [id number]

## 2019-01-08 NOTE — Progress Notes (Signed)
[facility] MD OP Progress Note  01/08/2019 11:21 AM Victoria Simmons  MRN:  [id number]  Chief Complaint- doing much better Chief Complaint    Follow-up; Medication Refill     HPI: Victoria Simmons is a 23 yr old Caucasian female, employed, lives in [place], has a history of bipolar disorder, GAD. Patient was assessed through a phone consult due to the Covid-19 situation. She reports that she has been calmer, less anxious and sleeping much better. She feels her mood has improved, not as anxious, not as irritable. She has not had any problems with reducing the dose of the fluvoxamine.  Continues to have passive suicidal thoughts, fewer than last week, denies any active thoughts, denies any plans.  Denies any triggers. Patient reports work is going well.  Patient reports she has good support system from her parents.  Visit Diagnosis:    ICD-10-CM   1. Bipolar 2 disorder, major depressive episode ([place]) F31.81   2. GAD (generalized anxiety disorder) F41.1     Past Psychiatric History: Patient reports inpatient mental health admission at [facility] in 2018.  Patient denies suicide attempts.  Patient reports past trials of Zoloft and Prozac  Past Medical History:  Past Medical History:  Diagnosis Date  . Anxiety   . Depression   . Dysmenorrhea   . Hypertriglyceridemia 2020  . Ovarian torsion 2008    Past Surgical History:  Procedure Laterality Date  . OVARY SURGERY  06/30/2007   dx laparoscopy, reduction of right adnexal torsion, bilateral oophoropexy of each ovary to posterior uterine fundus    Family Psychiatric History: As noted below.  Family History:  Family History  Problem Relation Age of Onset  . Hepatitis B Mother   . Anxiety disorder Mother   . Hypertension Father   . ADD / ADHD Brother   . Ovarian cancer Paternal Grandmother     Social History: Patient is single.  She went to [employer] for a while however had medical problems and had to drop out.  She currently works full-time at [employer].   She lives in [place].  She has good support system from her family. Social History   Socioeconomic History  . Marital status: Single    Spouse name: Not on file  . Number of children: 0  . Years of education: Not on file  . Highest education level: Some college, no degree  Occupational History  . 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 Financial resource strain: Not hard at all  . Food insecurity:    Worry: Never true    Inability: Never true  . Transportation needs:    Medical: No    Non-medical: No  Tobacco Use  . Smoking status: Never Smoker  . Smokeless tobacco: Never Used  Substance and Sexual Activity  . Alcohol use: Yes    Alcohol/week: 5.0 standard drinks    Types: 5 Glasses of wine per week  . Drug use: No  . Sexual activity: Yes    Birth control/protection: I.U.D.    Comment: Kyleena inserted 02/21/18---Female partner  Lifestyle  . Physical activity:    Days per week: 5 days    Minutes per session: 60 min  . Stress: Not at all  Relationships  . Social connections:    Talks on phone: Three times a week    Gets together: More than three times a week    Attends religious service: Never    Active member of club  or organization: No    Attends meetings of clubs or organizations: Never    Relationship status: Never married  Other Topics Concern  . Not on file  Social History Narrative   ** Merged History Encounter **        Allergies: No Known Allergies  Metabolic Disorder Labs: Lab Results  Component Value Date   HGBA1C 4.9 05/14/2017   MPG 94 05/14/2017   No results found for: PROLACTIN Lab Results  Component Value Date   CHOL 149 12/08/2018   TRIG 182 (H) 12/08/2018   HDL 40 12/08/2018   CHOLHDL 3.7 12/08/2018   VLDL 30 05/14/2017   LDLCALC 73 12/08/2018   LDLCALC 102 (H) 11/21/2017   Lab Results  Component Value Date   TSH 0.506 12/08/2018   TSH 3.491 05/14/2017    Therapeutic Level Labs: No results found  for: LITHIUM No results found for: VALPROATE No components found for:  CBMZ  Current Medications: Current Outpatient Medications  Medication Sig Dispense Refill  . ARIPiprazole (ABILIFY) 5 MG tablet Take 2 tablets (10 mg total) by mouth daily. 30 tablet 0  . doxycycline (VIBRAMYCIN) 100 MG capsule Take 1 capsule (100 mg total) by mouth 2 (two) times daily. 20 capsule 0  . fluvoxaMINE (LUVOX) 100 MG tablet Take 1 tablet (100 mg total) by mouth at bedtime. 90 tablet 0  . ibuprofen (ADVIL,MOTRIN) 800 MG tablet Take 1 tablet (800 mg total) by mouth every 8 (eight) hours as needed. 30 tablet 2  . ipratropium (ATROVENT) 0.06 % nasal spray Place 2 sprays into both nostrils 4 (four) times daily. 15 mL 12  . lamoTRIgine (LAMICTAL) 100 MG tablet To be combined with 25 mg 90 tablet 0  . lamoTRIgine (LAMICTAL) 25 MG tablet Take 1 tablet (25 mg total) by mouth daily. To be combined with 100 mg 30 tablet 0  . Levonorgestrel (KYLEENA IU) by Intrauterine route.    . predniSONE (DELTASONE) 50 MG tablet Take 1 tablet (50 mg total) by mouth daily with breakfast. 5 tablet 0  . traZODone (DESYREL) 150 MG tablet Take 1 tablet (150 mg total) by mouth at bedtime. 30 tablet 1   No current facility-administered medications for this visit.      Musculoskeletal: Strength & Muscle Tone: within normal limits Gait & Station: normal Patient leans: N/A  Psychiatric Specialty Exam: Review of Systems  Psychiatric/Behavioral: The patient is nervous/anxious and has insomnia.   All other systems reviewed and are negative.   There were no vitals taken for this visit.There is no height or weight on file to calculate BMI.  General Appearance: Casual  Eye Contact:  Fair  Speech:  Clear and Coherent  Volume:  Normal  Mood:  improved  Affect:  Congruent  Thought Process:  Goal Directed and Descriptions of Associations: Intact  Orientation:  Full (Time, Place, and Person)  Thought Content: Logical   Suicidal Thoughts:   No  Homicidal Thoughts:  No  Memory:  Immediate;   Fair Recent;   Fair Remote;   Fair  Judgement:  Fair  Insight:  Fair  Psychomotor Activity:  Normal  Concentration:  Concentration: Fair and Attention Span: Fair  Recall:  [employer] of Knowledge: Fair  Language: Fair  Akathisia:  Some jitteriness  Handed:  Right  AIMS (if indicated):   Assets:  Communication Skills Desire for Improvement Social Support  ADL's:  Intact  Cognition: WNL  Sleep:  better   Screenings: AIMS     Admission (  Discharged) from 05/13/2017 in [place] Total Score  0    AUDIT     Admission (Discharged) from 05/13/2017 in [place]  Alcohol Use Disorder Identification Test Final Score (AUDIT)  0    PHQ2-9     Office Visit from 06/19/2017 in [place] at [facility] Total Score  0  PHQ-9 Total Score  4       Assessment and Plan: Jaleisa is a 23 yr old Caucasian female who is single, employed, lives in [place], has a history of bipolar disorder, generalized anxiety disorder, presented to clinic today for a follow-up visit.  Patient used to see Dr. Einar Grad here in clinic.  Patient reports mood lability, sleep problems.  Patient will benefit from medication changes.  Plan as noted below.  Plan Bipolar 2 disorder Continue lamotrigine at 125 mg p.o. daily. Continue Luvox at 50 mg p.o. daily Continue Abilify at 10mg  po qd, Discussed side effects of metabolic disturbances and long term movement disorders. Patient gave verbal consent.  For generalized anxiety disorder-unstable Continue CBT. Medications above will be helpful in decreasing anxiety.  For insomnia-improved Continue  Trazodone at 150mg  p.o. nightly'  Discussed with patient to get labs-TSH.  Patient advised to continue psychotherapy sessions with Ms. [name], she has upcoming appointment.  Patient to follow-up in clinic in 1 week. If she has suicidal thoughts that she  wants to act upon, she is to reach out to family members, and call 911.  This note was generated in part or whole with voice recognition software. Voice recognition is usually quite accurate but there are transcription errors that can and very often do occur. I apologize for any typographical errors that were not detected and corrected.       Elvin So, MD 01/08/2019, 11:21 AM

## 2019-01-19 ENCOUNTER — Encounter: Payer: Self-pay | Admitting: Psychiatry

## 2019-01-19 MED FILL — traZODone HCL 150 MG TABS: 150 | 30 days supply | Qty: 30 | Fill #0

## 2019-01-22 ENCOUNTER — Encounter: Payer: Self-pay | Admitting: Licensed Clinical Social Worker

## 2019-01-22 ENCOUNTER — Ambulatory Visit: Payer: No Typology Code available for payment source | Admitting: Psychiatry

## 2019-01-22 ENCOUNTER — Encounter: Payer: Self-pay | Admitting: Child and Adolescent Psychiatry

## 2019-01-22 ENCOUNTER — Ambulatory Visit (INDEPENDENT_AMBULATORY_CARE_PROVIDER_SITE_OTHER): Payer: No Typology Code available for payment source | Admitting: Child and Adolescent Psychiatry

## 2019-01-22 ENCOUNTER — Other Ambulatory Visit: Payer: Self-pay

## 2019-01-22 ENCOUNTER — Ambulatory Visit (INDEPENDENT_AMBULATORY_CARE_PROVIDER_SITE_OTHER): Payer: No Typology Code available for payment source | Admitting: Licensed Clinical Social Worker

## 2019-01-22 DIAGNOSIS — F411 Generalized anxiety disorder: Secondary | ICD-10-CM | POA: Diagnosis not present

## 2019-01-22 DIAGNOSIS — F3181 Bipolar II disorder: Secondary | ICD-10-CM | POA: Diagnosis not present

## 2019-01-22 MED ORDER — LAMOTRIGINE 25 MG PO TABS: 25.0000 mg | ORAL_TABLET | Freq: Every day | ORAL | 0 refills | Status: DC

## 2019-01-22 MED ORDER — FLUVOXAMINE MALEATE 50 MG PO TABS: 50.0000 mg | ORAL_TABLET | Freq: Every day | ORAL | 0 refills | Status: DC

## 2019-01-22 MED ORDER — LAMOTRIGINE 100 MG PO TABS: ORAL_TABLET | ORAL | 0 refills | Status: DC

## 2019-01-22 MED ORDER — TRAZODONE HCL 150 MG PO TABS: 150.0000 mg | ORAL_TABLET | Freq: Every day | ORAL | 1 refills | Status: DC

## 2019-01-22 MED ORDER — ARIPIPRAZOLE 10 MG PO TABS: 10.0000 mg | ORAL_TABLET | Freq: Every day | ORAL | 1 refills | Status: DC

## 2019-01-22 NOTE — Progress Notes (Signed)
TC on  01-22-19 @ 8:35 pt medical and surgical hx was reviewed with no changes.  Pt allergies was reviewed with no changes. Pt medications and pharmacy were reviewed and updated. No vitals taken because this is a phone consult.
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
## 2019-01-22 NOTE — Progress Notes (Signed)
Virtual Visit via Video Note  I connected with Victoria Simmons on 01/22/19 at  9:00 AM EDT by a video enabled telemedicine application and verified that I am speaking with the correct person using two identifiers.   I discussed the limitations of evaluation and management by telemedicine and the availability of in person appointments. The patient expressed understanding and agreed to proceed.  History of Present Illness:  Victoria Simmons is a 23 yr old Caucasian female, employed as a day Automotive engineer, lives in [place], has a history of bipolar depression, GAD and insomnia. She has been a patient of Dr. Einar Grad since 2017 and was last seen by her about two weeks ago. She was seen and evaluated by this writer over the telemedicine for the scheduled follow up visit since Dr. Einar Grad is out of office.    [name] reported that she has been doing very well since the last visit. She reported that she has been feeling much more calmer, anxiety has significantly reduced and her mood is "very good" since she started taking Abilify. She reported that she does not have bad thoughts anymore, reported last occurrence was prior to last appointment with Dr Einar Grad and described these thoughts as "what if I bang my head, etc..". She reported that she has been sleeping better, and has been mindful about eating because she is aware of weight gain associated with Abilify. She denies any new psychosocial stressors. In addition to Abilify, work is less stressful due to reduced number of kids coming to day care. She reported that her relationship with her family is supportive, but has improved more since her mood is better. We discussed to continue medications as she is taking at present and usually her parents keep all the medications.      Observations/Objective:  Mental Status Exam: Appearance: casually dressed; failry groomed; no overt signs of trauma or distress noted Attitude: calm, cooperative with good eye contact Activity: No PMA/PMR,  no tics/no tremors; no EPS noted  Speech: normal rate, rhythm and volume Thought Process: Logical, linear, and goal-directed.  Associations: no looseness, tangentiality, circumstantiality, flight of ideas, thought blocking or word salad noted Thought Content: (abnormal/psychotic thoughts): no abnormal or delusional thought process evidenced SI/HI: denies Si/Hi Perception: no illusions or visual/auditory hallucinations noted; no response to internal stimuli demonstrated Mood & Affect: "very good"/full range, neutral Judgment & Insight: both fair Attention and Concentration : Good Cognition : WNL Language : Good ADL - Intact   Assessment and Plan:  Victoria Simmons is a 23 yr old Caucasian female who is single, employed, lives in [place], has a history of bipolar disorder, generalized anxiety disorder, insomnia presented to clinic today for a follow-up visit.  She is a patient of Dr Einar Grad and saw this Probation officer today in her absence.  Has noted improvement in mood and anxiety both with recent med adjustments/therapy + decrease in psychosocial stressors.   Plan Bipolar 2 disorder (improving) Continue lamotrigine at 125 mg p.o. daily. Continue Luvox at 50 mg p.o. daily Continue Abilify at 10mg  po qd.  For generalized anxiety disorder-unstable Continue CBT. Medications above will be helpful in decreasing anxiety. Previously tried Prozac upto 10 mg daily and Zoloft 150 mg daily - stopped due to inefficacy.   For insomnia-improved Continue  Trazodone at 150mg  p.o. nightly'  Labs(03/02) CBC - Stable; CMP - Stable; TSH - WNL; Lipid panel - WNL except TG of 182; Glucose - 80  Patient advised to continue psychotherapy sessions with Ms. [name], she has  upcoming appointment.  Patient to follow-up in clinic in 1 week. If she has suicidal thoughts that she wants to act upon, she is to reach out to family members, and call 911.    Follow Up Instructions:    I discussed the assessment and  treatment plan with the patient. The patient was provided an opportunity to ask questions and all were answered. The patient agreed with the plan and demonstrated an understanding of the instructions.   The patient was advised to call back or seek an in-person evaluation if the symptoms worsen or if the condition fails to improve as anticipated.  I provided 15 minutes of face-to-face time during this encounter.   Orlene Erm, MD

## 2019-02-04 MED FILL — ARIPiprazole 10 MG TABS: 10 | 30 days supply | Qty: 30 | Fill #1

## 2019-02-05 ENCOUNTER — Encounter: Payer: Self-pay | Admitting: Child and Adolescent Psychiatry

## 2019-02-05 ENCOUNTER — Other Ambulatory Visit: Payer: Self-pay

## 2019-02-05 ENCOUNTER — Ambulatory Visit (INDEPENDENT_AMBULATORY_CARE_PROVIDER_SITE_OTHER): Payer: No Typology Code available for payment source | Admitting: Child and Adolescent Psychiatry

## 2019-02-05 DIAGNOSIS — F3181 Bipolar II disorder: Secondary | ICD-10-CM

## 2019-02-05 DIAGNOSIS — F411 Generalized anxiety disorder: Secondary | ICD-10-CM

## 2019-02-05 MED ORDER — TRAZODONE HCL 100 MG PO TABS: 100.0000 mg | ORAL_TABLET | Freq: Every day | ORAL | Status: DC

## 2019-02-05 NOTE — Progress Notes (Addendum)
Virtual Visit via Video Note  I connected with ANAYIA EUGENE on 02/05/19 at  9:00 AM EDT by a video enabled telemedicine application and verified that I am speaking with the correct person using two identifiers.  Location: Pt - Home Provider - Office  I discussed the limitations of evaluation and management by telemedicine and the availability of in person appointments. The patient expressed understanding and agreed to proceed.  History of Present Illness:  Devann is a 23 yr old Caucasian female, employed as a day Automotive engineer, lives in [place], has a history of bipolar depression, GAD and insomnia. She has been a patient of Dr. Einar Grad since 2017 and transitioned to this writer in 01/2019.     Buelah reported that he she has been doing well, denies any new concerns or questions for today. Reports that she continues to have improvement in her symptoms. She reports that her mood has been stable, racing thoughts are decreased, she has been much more calmer at the work, denies any suicidal thoughts, anxiety is stable, she has been sleeping well and eating well.. She reports that she continues to work, has been regularly taking her medications and denies any side effects from medications. Writer verified her medications with her mother since mother manages her medications at home with her(pt) permission.  Mother confirmed that pt is on Abilify 10 mg daily, Luvox 50 mg daily, Lamictal 125 mg daily. Mother reported that she has been giving Trazodone 100 mg instead of 150 mg daily. We discussed to continue Trazodone at 100 mg daily since she has been doing well with sleep on that dose and discontinue Luvox. Pt and mother verbalized understanding.   Observations/Objective:  Mental Status Exam: Appearance: casually dressed; well groomed; no overt signs of trauma or distress noted Attitude: calm, cooperative with good eye contact Activity: No PMA/PMR, no tics/no tremors; no EPS noted  Speech: normal rate,  rhythm and volume Thought Process: Logical, linear, and goal-directed.  Associations: no looseness, tangentiality, circumstantiality, flight of ideas, thought blocking or word salad noted Thought Content: (abnormal/psychotic thoughts): no abnormal or delusional thought process evidenced SI/HI: denies Si/Hi Perception: no illusions or visual/auditory hallucinations noted; no response to internal stimuli demonstrated Mood & Affect: "good"/full range, neutral Judgment & Insight: both fair Attention and Concentration : Good Cognition : WNL Language : Good ADL - Intact   Assessment and Plan:  Navada is a 23 yr old Caucasian female who is single, employed, lives in [place], has a history of bipolar disorder, generalized anxiety disorder, insomnia presented to clinic today for a follow-up visit.  She is a patient of Dr Einar Grad and saw this Probation officer today in her absence.  Continues to note improvement in mood and anxiety both with recent med adjustments/therapy + decrease in psychosocial stressors.   Plan Bipolar 2 disorder (improving) Continue lamotrigine at 125 mg p.o. daily. Stop Luvox at 50 mg p.o. daily Continue Abilify at 10mg  po qd.  For generalized anxiety disorder-unstable Continue CBT. Medications above will be helpful in decreasing anxiety. Previously tried Prozac upto 10 mg daily and Zoloft 150 mg daily - stopped due to inefficacy.   For insomnia-improved Continue  Trazodone at 150mg  p.o. nightly'  Labs(03/02) CBC - Stable; CMP - Stable; TSH - WNL; Lipid panel - WNL except TG of 182; Glucose - 80  Patient advised to continue psychotherapy sessions with Ms. [name], she has upcoming appointment.  Patient to follow-up in clinic in 2 weeks. If she has suicidal thoughts that  she wants to act upon, she is to reach out to family members, and call 911.    Follow Up Instructions:    I discussed the assessment and treatment plan with the patient. The patient was provided  an opportunity to ask questions and all were answered. The patient agreed with the plan and demonstrated an understanding of the instructions.   The patient was advised to call back or seek an in-person evaluation if the symptoms worsen or if the condition fails to improve as anticipated.  I provided 15 minutes of face-to-face time during this encounter.  Addendum: Mother called after the visit to clarify the medications again. Mother reported following 1. Luvox - She was giving Luvox 100 mg instead of 50 mg daily. Recommended to decrease Luvox to 50 mg daily.  2. Trazodone - She was giving Trazodone 150 mg daily, discussed to continue with that, and she can do 1.5 tablets of Trazodone 100 mg tablets to make total daily dose of 150 mg daily.  3. Abilify - she was giving Abilify 10 mg daily, discussed to continue. 4. Lamictal - she was giving Lamictal 125 mg daily, discussed to continue.   Mother repeated back the correct medications and their doses.   Orlene Erm, MD

## 2019-02-09 ENCOUNTER — Ambulatory Visit: Payer: No Typology Code available for payment source

## 2019-02-09 ENCOUNTER — Other Ambulatory Visit: Payer: Self-pay

## 2019-02-09 ENCOUNTER — Encounter: Payer: Self-pay | Admitting: Licensed Clinical Social Worker

## 2019-02-09 ENCOUNTER — Ambulatory Visit (INDEPENDENT_AMBULATORY_CARE_PROVIDER_SITE_OTHER): Payer: No Typology Code available for payment source | Admitting: Licensed Clinical Social Worker

## 2019-02-09 ENCOUNTER — Telehealth: Payer: Self-pay | Admitting: Child and Adolescent Psychiatry

## 2019-02-09 DIAGNOSIS — F411 Generalized anxiety disorder: Secondary | ICD-10-CM | POA: Diagnosis not present

## 2019-02-09 DIAGNOSIS — F3181 Bipolar II disorder: Secondary | ICD-10-CM

## 2019-02-09 NOTE — Telephone Encounter (Signed)
Pt's therapist reached out to this writer about pt. Therapist informed that pt had difficulties with anxiety and cut her self last week. Pt wondered if this was due to decrease in Luvox. Writer spoke with pt over the phone, she reported that she felt anxious all of sudden last Saturday without any stressors, felt numb so she cut self but not in the context of suicidal intent. Reports that she did not like cutting herself, not planning to do it again and denies any suicidal thoughts. She reported that she is feeling better, less anxious. She would like to keep Luvox at 50 mg daily. I discussed to increase Luvox to 100 mg daily and in future we can go down if needed. Pt verbalized understanding.

## 2019-02-09 NOTE — Progress Notes (Signed)
Virtual Visit via Telephone Note  I connected with Victoria Simmons on 02/09/19 at 10:00 AM EDT by telephone and verified that I am speaking with the correct person using two identifiers.   I discussed the limitations, risks, security and privacy concerns of performing an evaluation and management service by telephone and the availability of in person appointments. I also discussed with the patient that there may be a patient responsible charge related to this service. The patient expressed understanding and agreed to proceed.   I discussed the assessment and treatment plan with the patient. The patient was provided an opportunity to ask questions and all were answered. The patient agreed with the plan and demonstrated an understanding of the instructions.   The patient was advised to call back or seek an in-person evaluation if the symptoms worsen or if the condition fails to improve as anticipated.  I provided 30 minutes of non-face-to-face time during this encounter.   [name], Victoria    THERAPIST PROGRESS NOTE  Session Time: 1000  Participation Level: Active  Behavioral Response: NAAlertAnxious  Type of Therapy: Individual Therapy  Treatment Goals addressed: Anxiety  Interventions: Strength-based  Summary: Victoria Simmons is a 23 y.o. female who presents with continued symptoms of her diagnosis. Victoria Simmons reports doing "okay," since our last session, but added her medications were adjusted and she has noticed negative behavioral side effects from that medication reduction. Victoria asked Victoria Simmons to elaborate on those negative side effects and she reported, "my mood has been all over the place, my anxiety has been higher, I almost had a panic attack... and there's something else I need to tell you. I cut myself last week." Victoria thanked Victoria Simmons for being open and sharing that information. Victoria stated she would reach out to the MD in the clinic with the information Victoria Simmons provided, and he  would reach out to her ASAP. Additionally, Victoria asked for more information regarding the incident of self harm. Victoria Simmons reported she was not trying to kill herself, but rather, "I wasn't feeling anything so I just thought what would happen if I cut myself. I don't plan on doing it again." Victoria Simmons reports she has no plans to cut herself or harm herself again as, "well, it hurt, and now I have to hide it from my work and family." Victoria Simmons was able to contract for safety, and Victoria ensured Victoria Simmons had the suicide hotline number as well as the crisis line. Additionally, Victoria Simmons discussed ways Victoria Simmons could manage her thoughts in the moment. Victoria Simmons reported she could try challenging her thoughts, listening to music, and talking more to her friends, "I've been talking to them a lot and I told them what happened so I'm trying to use my support system." Victoria highlighted the progress Victoria Simmons has made, which she challenged, "I've had a really bad week so I don't feel like I'm making progress." Victoria pointed out the ways Victoria Simmons has improved over the last six months, and encouraged her to focus on the progress versus the last week. Victoria Simmons expressed understanding and agreement with this idea, "I'm too hard on myself."   Suicidal/Homicidal: No  Therapist Response: Victoria Simmons continues to work towards her tx goals but has not yet reached them. She continues to work on emotional regulation skills and challenging negative thoughts to address anxiety and depression symptoms. We will continue to utilize CBT moving forward.   Plan: Return again in 2 weeks.  Diagnosis: Axis I: Bipolar 2 Disorder, major depressive episode  Axis II: No diagnosis    [name], Victoria 02/09/2019

## 2019-02-18 ENCOUNTER — Ambulatory Visit (INDEPENDENT_AMBULATORY_CARE_PROVIDER_SITE_OTHER): Payer: No Typology Code available for payment source | Admitting: Nurse Practitioner

## 2019-02-18 ENCOUNTER — Telehealth: Payer: Self-pay

## 2019-02-18 ENCOUNTER — Encounter: Payer: Self-pay | Admitting: Nurse Practitioner

## 2019-02-18 VITALS — Ht 65.0 in | Wt 155.0 lb

## 2019-02-18 DIAGNOSIS — R197 Diarrhea, unspecified: Secondary | ICD-10-CM

## 2019-02-18 DIAGNOSIS — R300 Dysuria: Secondary | ICD-10-CM

## 2019-02-18 MED ORDER — PHENAZOPYRIDINE HCL 100 MG PO TABS: 100.0000 mg | ORAL_TABLET | Freq: Three times a day (TID) | ORAL | 0 refills | Status: DC | PRN

## 2019-02-18 MED FILL — PHENAZOPYRIDINE 100 MG TAB: 100 | 2 days supply | Qty: 6 | Fill #0

## 2019-02-18 NOTE — Telephone Encounter (Signed)
Pt does not feel like emptying bladder; pt trickles when she urinates with pain and frequency of urine. Dr Deborra Medina is pts PCP and clinical mgr advised pt to contact Dr Hulen Shouts office; pt voiced understanding.

## 2019-02-18 NOTE — Patient Instructions (Signed)
Continue to push oral hydration,  avoid diary, greasy or spicy foods. Call office if no improvement in 48hrs. Use pyridium for dysuria
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
## 2019-02-19 ENCOUNTER — Ambulatory Visit (INDEPENDENT_AMBULATORY_CARE_PROVIDER_SITE_OTHER): Payer: No Typology Code available for payment source | Admitting: Child and Adolescent Psychiatry

## 2019-02-19 ENCOUNTER — Encounter: Payer: Self-pay | Admitting: Child and Adolescent Psychiatry

## 2019-02-19 ENCOUNTER — Other Ambulatory Visit: Payer: Self-pay

## 2019-02-19 DIAGNOSIS — F3181 Bipolar II disorder: Secondary | ICD-10-CM | POA: Diagnosis not present

## 2019-02-19 DIAGNOSIS — F411 Generalized anxiety disorder: Secondary | ICD-10-CM | POA: Diagnosis not present

## 2019-02-19 MED ORDER — LAMOTRIGINE 25 MG PO TABS: 25.0000 mg | ORAL_TABLET | Freq: Every day | ORAL | 0 refills | Status: DC

## 2019-02-19 MED ORDER — TRAZODONE HCL 100 MG PO TABS: 150.0000 mg | ORAL_TABLET | Freq: Every day | ORAL | 0 refills | Status: DC

## 2019-02-19 MED ORDER — ARIPIPRAZOLE 10 MG PO TABS: 10.0000 mg | ORAL_TABLET | Freq: Every day | ORAL | 0 refills | Status: DC

## 2019-02-19 MED ORDER — LAMOTRIGINE 100 MG PO TABS: ORAL_TABLET | ORAL | 0 refills | Status: DC

## 2019-02-19 NOTE — Progress Notes (Signed)
Virtual Visit via Video Note  I connected with Victoria Simmons on 02/19/19 at  8:30 AM EDT by a video enabled telemedicine application and verified that I am speaking with the correct person using two identifiers.  Location: Patient: Home Provider: Office   I discussed the limitations of evaluation and management by telemedicine and the availability of in person appointments. The patient expressed understanding and agreed to proceed.  BH MD/PA/NP OP Progress Note  02/19/2019 9:13 AM Victoria Simmons  MRN:  [id number]  Chief Complaint: Medication management follow up for bipolar disorder, GAD, Insmonia HPI:  Victoria Simmons is a 23 yr old Caucasian female, employed as a day Automotive engineer, lives in [place], has a history of bipolar depression, GAD and insomnia.She has been a patient of Dr. Einar Grad since 2017 and transitioned to this writer in 01/2019.    During the last visit [name]'s Luvox was decreased to 50 mg daily. [name] felt more depressed and anxious with decreased and informed therapist subsequently that she had cut her self but denied any SI. Writer spoke with [name] on 05/04 and asked to increase Luvox to 100 mg Qdaily. Today [name] reported that she has been feeling better as compare to when she spoke with this Probation officer but reported that she has been feeling more irritable since the weekend. 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 She reported that overall it is getting better and she is not as irritated as she was before Abilify. She denies any SI or self harm behaviors. Eating well. She reported that her sleep has been better but some days it takes time for her to fall asleep and she may wake up in between. We discussed to increase Trazodone to 150 mg QHS. Writer discussed the option to increase Lamictal, however [name] would like to keep the same dose.   Visit Diagnosis:    ICD-10-CM   1. Bipolar 2 disorder,  major depressive episode (HCC) F31.81 lamoTRIgine (LAMICTAL) 100 MG tablet    lamoTRIgine (LAMICTAL) 25 MG tablet    traZODone (DESYREL) 100 MG tablet    ARIPiprazole (ABILIFY) 10 MG tablet  2. GAD (generalized anxiety disorder) F41.1 traZODone (DESYREL) 100 MG tablet    ARIPiprazole (ABILIFY) 10 MG tablet    Past Psychiatric History: On previous inpatient admission at [facility] in 2018, no previous suicide attempts, has hx of cutting, and past trials of meds include Prozac and Zoloft. Decrease in Luvox to 50 mg lead to increase in anxiety, therefore it increased back to 100 mg daily.   Past Medical History:  Past Medical History:  Diagnosis Date  . Anxiety   . Depression   . Dysmenorrhea   . Hypertriglyceridemia 2020  . MDD (major depressive disorder) 06/19/2017  . Ovarian torsion 2008    Past Surgical History:  Procedure Laterality Date  . OVARY SURGERY  06/30/2007   dx laparoscopy, reduction of right adnexal torsion, bilateral oophoropexy of each ovary to posterior uterine fundus    Family Psychiatric History: As mentioned in initial H&P, reviewed today, no change  Family History:  Family History  Problem Relation Age of Onset  . Hepatitis B Mother   . Anxiety disorder Mother   . Hypertension Father   . ADD / ADHD Brother   . Ovarian cancer Paternal Grandmother     Social History:  Social History   Socioeconomic History  . Marital status: Single  Spouse name: Not on file  . Number of children: 0  . Years of education: Not on file  . Highest education level: Some college, no degree  Occupational History  . 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 Financial resource strain: Not hard at all  . Food insecurity:    Worry: Never true    Inability: Never true  . Transportation needs:    Medical: No    Non-medical: No  Tobacco Use  . Smoking status: Never Smoker  . Smokeless tobacco: Never Used  Substance and Sexual Activity  . Alcohol  use: Yes    Alcohol/week: 5.0 standard drinks    Types: 5 Glasses of wine per week  . Drug use: No  . Sexual activity: Yes    Birth control/protection: I.U.D.    Comment: Kyleena inserted 02/21/18---Female partner  Lifestyle  . Physical activity:    Days per week: 5 days    Minutes per session: 60 min  . Stress: Not at all  Relationships  . Social connections:    Talks on phone: Three times a week    Gets together: More than three times a week    Attends religious service: Never    Active member of club or organization: No    Attends meetings of clubs or organizations: Never    Relationship status: Never married  Other Topics Concern  . Not on file  Social History Narrative   ** Merged History Encounter **        Allergies: No Known Allergies  Metabolic Disorder Labs: Lab Results  Component Value Date   HGBA1C 4.9 05/14/2017   MPG 94 05/14/2017   No results found for: PROLACTIN Lab Results  Component Value Date   CHOL 149 12/08/2018   TRIG 182 (H) 12/08/2018   HDL 40 12/08/2018   CHOLHDL 3.7 12/08/2018   VLDL 30 05/14/2017   LDLCALC 73 12/08/2018   LDLCALC 102 (H) 11/21/2017   Lab Results  Component Value Date   TSH 0.506 12/08/2018   TSH 3.491 05/14/2017    Therapeutic Level Labs: No results found for: LITHIUM No results found for: VALPROATE No components found for:  CBMZ  Current Medications: Current Outpatient Medications  Medication Sig Dispense Refill  . ARIPiprazole (ABILIFY) 10 MG tablet Take 1 tablet (10 mg total) by mouth daily. 30 tablet 0  . lamoTRIgine (LAMICTAL) 100 MG tablet To be combined with 25 mg 30 tablet 0  . lamoTRIgine (LAMICTAL) 25 MG tablet Take 1 tablet (25 mg total) by mouth daily. To be combined with 100 mg 30 tablet 0  . Levonorgestrel (KYLEENA IU) by Intrauterine route.    . phenazopyridine (PYRIDIUM) 100 MG tablet Take 1 tablet (100 mg total) by mouth 3 (three) times daily as needed for pain (with food). 6 tablet 0  .  traZODone (DESYREL) 100 MG tablet Take 1.5 tablets (150 mg total) by mouth at bedtime. 45 tablet 0   No current facility-administered medications for this visit.      Musculoskeletal: Strength & Muscle Tone: unable to assess since visit was over the telemedicine. Gait & Station: unable to assess since visit was over the telemedicine. Patient leans: N/A  Psychiatric Specialty Exam: ROSReview of 12 systems negative except as mentioned in HPI  There were no vitals taken for this visit.There is no height or weight on file to calculate BMI.  General Appearance: Casual and Fairly Groomed  Eye Contact:  Good  Speech:  Clear and Coherent and Normal Rate  Volume:  Normal  Mood:  "better"  Affect:  Appropriate, Congruent and Full Range  Thought Process:  Goal Directed and Linear  Orientation:  Full (Time, Place, and Person)  Thought Content: Logical   Suicidal Thoughts:  No  Homicidal Thoughts:  No  Memory:  Immediate;   Fair Recent;   Fair Remote;   Fair  Judgement:  Good  Insight:  Good  Psychomotor Activity:  Normal  Concentration:  Concentration: Good and Attention Span: Good  Recall:  Good  Fund of Knowledge: Good  Language: Good  Akathisia:  No    AIMS (if indicated): not done  Assets:  Communication Skills Desire for Improvement Financial Resources/Insurance Housing Leisure Time Physical Health Social Support Transportation Vocational/Educational  ADL's:  Intact  Cognition: WNL  Sleep:  Good   Screenings: AIMS     Admission (Discharged) from 05/13/2017 in [place] Total Score  0    AUDIT     Admission (Discharged) from 05/13/2017 in [place]  Alcohol Use Disorder Identification Test Final Score (AUDIT)  0    PHQ2-9     Office Visit from 06/19/2017 in [place] at [facility]  PHQ-2 Total Score  0  PHQ-9 Total Score  4       Assessment and Plan:   Ysabelle is a 23 yr old Caucasian female who  is single, employed, lives in [place], has a history of bipolar disorder, generalized anxiety disorder, insomnia presented to clinic today for a follow-up visit. She was a patient of Dr Einar Grad and transitioned to this Probation officer today in April, 2020. Mood is slightly more irritable than last visit, and anxiety is increasing in the context of increase workload at work. Would like to keep meds as it is for now.   Plan Bipolar 2 disorder (improving) Continue lamotrigine at 125 mg p.o. daily. Continue withLuvox 100 mg p.o. daily Continue Abilify at10mg  po qd.  For generalized anxiety disorder-unstable Continue CBT. Luvox as mentioned above.  Medications above will be helpful in decreasing anxiety. Previously tried Prozac upto 10 mg daily and Zoloft 150 mg daily - stopped due to inefficacy.   For insomnia-improved Increase Trazodone at150mg  p.o. nightly  Labs(03/02) CBC - Stable; CMP - Stable; TSH - WNL; Lipid panel - WNL except TG of 182; Glucose - 80  Patient advised to continue psychotherapy sessions with Ms. [name], she has upcoming appointment.  Patient to follow-up in clinic in 2 weeks. If she has suicidal thoughts that she wants to act upon, she is to reach out to family members, and call 911.   Follow Up Instructions:    I discussed the assessment and treatment plan with the patient. The patient was provided an opportunity to ask questions and all were answered. The patient agreed with the plan and demonstrated an understanding of the instructions.   The patient was advised to call back or seek an in-person evaluation if the symptoms worsen or if the condition fails to improve as anticipated.  I provided 20 minutes of non-face-to-face time during this encounter.   Orlene Erm, MD   Orlene Erm, MD 02/19/2019, 9:13 AM

## 2019-02-24 ENCOUNTER — Other Ambulatory Visit: Payer: Self-pay

## 2019-02-24 ENCOUNTER — Ambulatory Visit (INDEPENDENT_AMBULATORY_CARE_PROVIDER_SITE_OTHER): Payer: No Typology Code available for payment source | Admitting: Licensed Clinical Social Worker

## 2019-02-24 ENCOUNTER — Encounter: Payer: Self-pay | Admitting: Licensed Clinical Social Worker

## 2019-02-24 DIAGNOSIS — F3181 Bipolar II disorder: Secondary | ICD-10-CM | POA: Diagnosis not present

## 2019-02-24 NOTE — Progress Notes (Signed)
Virtual Visit via Video Note  I connected with Victoria Simmons on 02/24/19 at  8:00 AM EDT by a video enabled telemedicine application and verified that I am speaking with the correct person using two identifiers.   I discussed the limitations of evaluation and management by telemedicine and the availability of in person appointments. The patient expressed understanding and agreed to proceed.  I discussed the assessment and treatment plan with the patient. The patient was provided an opportunity to ask questions and all were answered. The patient agreed with the plan and demonstrated an understanding of the instructions.   The patient was advised to call back or seek an in-person evaluation if the symptoms worsen or if the condition fails to improve as anticipated.  I provided 30  minutes of non-face-to-face time during this encounter.   Victoria Simmons    THERAPIST PROGRESS NOTE  Session Time: 0800  Participation Level: Active  Behavioral Response: CasualAlertNA  Type of Therapy: Individual Therapy  Treatment Goals addressed: Anxiety  Interventions: Supportive  Summary: Victoria Simmons is a 23 y.o. female who presents with continued symptoms related to her diagnosis. [name] reports doing well since our last session, "I'm back to feeling a lot better." She denies any SI or thoughts of self harm since our last conversation. She reported the MD changed her medication back, "and I've been feeling a lot better ever since." Simmons validated [name]'s feelings and how she was able to advocate for herself. 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 "My boss is still trying to be more accommodating of me, so that helps a lot." Simmons validated and normalized [name]' feelings of frustration at an increased work load, and encouraged her to take breaks when she feels her emotions becoming too heightened.  [name] expressed agreement with this idea. [name] reports last week she was feeling a little down, "so my grandmother and me went to [place] to see her sister and my cousin." [name] reports she was feeling cooped up due to the quarantine, but traveling always improves her mood. Simmons validated that idea, and encouraged [name] to utilize coping mechanisms such as painting when she is not able to leave the state. [name] reports she has continued painting and drawing, and has done several paintings that she is very proud of. Simmons encouraged [name] to continue painting as an outlet. [name] expressed understanding and agreement with this idea. Additionally, we reviewed CBT concepts that [name] can use when she is feeling anxious or depressed in order to challenge negative thoughts. [name] was able to understand these ideas and how she could utilize them to manage her mood more effectively.   Suicidal/Homicidal: No  Therapist Response: [name] continues to work towards her tx goals but has not yet reached them. We will continue to work on emotional regulation skills to [place] in staying safe/calm in the moment.   Plan: Return again in 2 weeks.  Diagnosis: Axis I: Bipolar II     Axis II: No diagnosis    Victoria Simmons 02/24/2019

## 2019-03-05 ENCOUNTER — Encounter: Payer: Self-pay | Admitting: Child and Adolescent Psychiatry

## 2019-03-05 ENCOUNTER — Telehealth: Payer: Self-pay

## 2019-03-05 ENCOUNTER — Ambulatory Visit (INDEPENDENT_AMBULATORY_CARE_PROVIDER_SITE_OTHER): Payer: No Typology Code available for payment source | Admitting: Child and Adolescent Psychiatry

## 2019-03-05 ENCOUNTER — Other Ambulatory Visit: Payer: Self-pay

## 2019-03-05 DIAGNOSIS — F5105 Insomnia due to other mental disorder: Secondary | ICD-10-CM | POA: Diagnosis not present

## 2019-03-05 DIAGNOSIS — F3181 Bipolar II disorder: Secondary | ICD-10-CM

## 2019-03-05 DIAGNOSIS — F99 Mental disorder, not otherwise specified: Secondary | ICD-10-CM | POA: Diagnosis not present

## 2019-03-05 DIAGNOSIS — F411 Generalized anxiety disorder: Secondary | ICD-10-CM

## 2019-03-05 MED ORDER — ARIPIPRAZOLE 10 MG PO TABS: 10.0000 mg | ORAL_TABLET | Freq: Every day | ORAL | 0 refills | Status: DC

## 2019-03-05 MED ORDER — FLUVOXAMINE MALEATE 100 MG PO TABS: 100.0000 mg | ORAL_TABLET | Freq: Every day | ORAL | 0 refills | Status: DC

## 2019-03-05 MED ORDER — TRAZODONE HCL 100 MG PO TABS: 150.0000 mg | ORAL_TABLET | Freq: Every day | ORAL | 0 refills | Status: DC

## 2019-03-05 MED ORDER — LAMOTRIGINE 100 MG PO TABS: 150.0000 mg | ORAL_TABLET | Freq: Every day | ORAL | 0 refills | Status: DC

## 2019-03-05 MED FILL — ARIPiprazole 10 MG TABS: 10 | 30 days supply | Qty: 30 | Fill #0

## 2019-03-05 NOTE — Progress Notes (Addendum)
Virtual Visit via Video Note  I connected with Victoria Simmons on 03/05/19 at 10:00 AM EDT by a video enabled telemedicine application and verified that I am speaking with the correct person using two identifiers.  Location: Patient: Home Provider: Office   I discussed the limitations of evaluation and management by telemedicine and the availability of in person appointments. The patient expressed understanding and agreed to proceed.  BH MD/PA/NP OP Progress Note  03/05/2019 10:17 AM Victoria Simmons  MRN:  [id number]  Chief Complaint: Medication management follow-up for bipolar disorder, GAD, insomnia.  HPI:  Victoria Simmons is a 23 yr old Caucasian female, employed as a day Automotive engineer, lives in [place], has a history of bipolar depression, GAD and insomnia.She has been a patient of Dr. Einar Grad since 2017 and transitioned to this writer in 01/2019.    Victoria Simmons was seen and evaluated over telemedicine encounter for medication management follow-up.  She reported that she has noted increased irritability, agitation since the last visit and despite increasing Luvox back to 100 mg she has not noticed any improvement.  She also reports increased anxiety, reports that anxiety comes out of nowhere.  She reports that anxiety and irritability is more at her work which does get better when she is home.  She reports that she continues to sleep well, denies any thoughts of suicide or self-harm, denies any AVH, did not admit any delusions.  She reports that she has been adherent to medications and denies any side effects with them.  She confirmed the medication that she has been taking.  She reports that she continues to see her therapist every other week and that has been helpful.  Visit Diagnosis:    ICD-10-CM   1. Bipolar 2 disorder, major depressive episode (HCC) F31.81 lamoTRIgine (LAMICTAL) 100 MG tablet    traZODone (DESYREL) 100 MG tablet    ARIPiprazole (ABILIFY) 10 MG tablet  2. GAD (generalized anxiety  disorder) F41.1 traZODone (DESYREL) 100 MG tablet    ARIPiprazole (ABILIFY) 10 MG tablet    Past Psychiatric History: One previous inpatient admission at [facility] in 2018, no previous suicide attempts, has hx of cutting, and past trials of meds include Prozac and Zoloft. Decrease in Luvox to 50 mg lead to increase in anxiety, therefore it increased back to 100 mg daily.   Past Medical History:  Past Medical History:  Diagnosis Date  . Anxiety   . Depression   . Dysmenorrhea   . Hypertriglyceridemia 2020  . MDD (major depressive disorder) 06/19/2017  . Ovarian torsion 2008    Past Surgical History:  Procedure Laterality Date  . OVARY SURGERY  06/30/2007   dx laparoscopy, reduction of right adnexal torsion, bilateral oophoropexy of each ovary to posterior uterine fundus    Family Psychiatric History: As mentioned in initial H&P, reviewed today, no change  Family History:  Family History  Problem Relation Age of Onset  . Hepatitis B Mother   . Anxiety disorder Mother   . Hypertension Father   . ADD / ADHD Brother   . Ovarian cancer Paternal Grandmother     Social History:  Social History   Socioeconomic History  . Marital status: Single    Spouse name: Not on file  . Number of children: 0  . Years of education: Not on file  . Highest education level: Some college, no degree  Occupational History  . 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 Emergency planning/management officer  strain: Not hard at all  . Food insecurity:    Worry: Never true    Inability: Never true  . Transportation needs:    Medical: No    Non-medical: No  Tobacco Use  . Smoking status: Never Smoker  . Smokeless tobacco: Never Used  Substance and Sexual Activity  . Alcohol use: Yes    Alcohol/week: 5.0 standard drinks    Types: 5 Glasses of wine per week  . Drug use: No  . Sexual activity: Yes    Birth control/protection: I.U.D.    Comment: Kyleena inserted 02/21/18---Female partner   Lifestyle  . Physical activity:    Days per week: 5 days    Minutes per session: 60 min  . Stress: Not at all  Relationships  . Social connections:    Talks on phone: Three times a week    Gets together: More than three times a week    Attends religious service: Never    Active member of club or organization: No    Attends meetings of clubs or organizations: Never    Relationship status: Never married  Other Topics Concern  . Not on file  Social History Narrative   ** Merged History Encounter **        Allergies: No Known Allergies  Metabolic Disorder Labs: Lab Results  Component Value Date   HGBA1C 4.9 05/14/2017   MPG 94 05/14/2017   No results found for: PROLACTIN Lab Results  Component Value Date   CHOL 149 12/08/2018   TRIG 182 (H) 12/08/2018   HDL 40 12/08/2018   CHOLHDL 3.7 12/08/2018   VLDL 30 05/14/2017   LDLCALC 73 12/08/2018   LDLCALC 102 (H) 11/21/2017   Lab Results  Component Value Date   TSH 0.506 12/08/2018   TSH 3.491 05/14/2017    Therapeutic Level Labs: No results found for: LITHIUM No results found for: VALPROATE No components found for:  CBMZ  Current Medications: Current Outpatient Medications  Medication Sig Dispense Refill  . ARIPiprazole (ABILIFY) 10 MG tablet Take 1 tablet (10 mg total) by mouth daily. 30 tablet 0  . fluvoxaMINE (LUVOX) 100 MG tablet Take 1 tablet (100 mg total) by mouth at bedtime. 30 tablet 0  . lamoTRIgine (LAMICTAL) 100 MG tablet Take 1.5 tablets (150 mg total) by mouth daily. 45 tablet 0  . Levonorgestrel (KYLEENA IU) by Intrauterine route.    . phenazopyridine (PYRIDIUM) 100 MG tablet Take 1 tablet (100 mg total) by mouth 3 (three) times daily as needed for pain (with food). 6 tablet 0  . traZODone (DESYREL) 100 MG tablet Take 1.5 tablets (150 mg total) by mouth at bedtime. 45 tablet 0   No current facility-administered medications for this visit.      Musculoskeletal: Strength & Muscle Tone: unable to  assess since visit was over the telemedicine. Gait & Station: unable to assess since visit was over the telemedicine. Patient leans: N/A  Psychiatric Specialty Exam: ROSReview of 12 systems negative except as mentioned in HPI  There were no vitals taken for this visit.There is no height or weight on file to calculate BMI.  General Appearance: Casual  Eye Contact:  Good  Speech:  Clear and Coherent and Normal Rate  Volume:  Normal  Mood:  "irritable"  Affect:  Appropriate and Full Range  Thought Process:  Goal Directed and Linear  Orientation:  Full (Time, Place, and Person)  Thought Content: Logical   Suicidal Thoughts:  No  Homicidal Thoughts:  No  Memory:  Immediate;   Fair Recent;   Fair Remote;   Fair  Judgement:  Good  Insight:  Good  Psychomotor Activity:  Normal  Concentration:  Concentration: Good and Attention Span: Good  Recall:  Good  Fund of Knowledge: Good  Language: Good  Akathisia:  No    AIMS (if indicated): not done  Assets:  Communication Skills Desire for Improvement Financial Resources/Insurance Housing Leisure Time Physical Health Social Support Transportation Vocational/Educational  ADL's:  Intact  Cognition: WNL  Sleep:  Good   Screenings: AIMS     Admission (Discharged) from 05/13/2017 in [place] Total Score  0    AUDIT     Admission (Discharged) from 05/13/2017 in [place]  Alcohol Use Disorder Identification Test Final Score (AUDIT)  0    PHQ2-9     Office Visit from 06/19/2017 in [place] at [facility]  PHQ-2 Total Score  0  PHQ-9 Total Score  4       Assessment and Plan:   Japji is a 23 yr old Caucasian female who is single, employed, lives in [place], has a history of bipolar disorder, generalized anxiety disorder, insomnia presented to clinic today for a follow-up visit. She was a patient of Dr Einar Grad and transitioned to this Probation officer in April, 2020. Mood  remains irritable and continues to be anxious, despite increasing Luvox back to 100 mg daily. Worsening of mood and anxiety most likely in the context of increase workload at work. Discussed risks and benefits of med adjustments, recommended to increase Lamictal to 150 mg daily to target irritability and mood instability vs increasing Abilify due to higher side effect profile with Abilify. Pt verbalized understanding.    Plan Bipolar 2 disorder (worse) Increase lamotrigine to 150 mg p.o. daily. Continue withLuvox 100 mg p.o. daily Continue Abilify at10mg  po qd.  For generalized anxiety disorder-unstable Continue CBT. Luvox as mentioned above.  Medications above will be helpful in decreasing anxiety. Previously tried Prozac upto 10 mg daily and Zoloft 150 mg daily - stopped due to inefficacy.   For insomnia-improved Continue Trazodone at150mg  p.o. nightly  Labs(03/02) CBC - Stable; CMP - Stable; TSH - WNL; Lipid panel - WNL except TG of 182; Glucose - 80  Patient advised to continue psychotherapy sessions with Ms. [name], she has upcoming appointment.  Patient to follow-up in clinic in 2 weeks. If she has suicidal thoughts that she wants to act upon, she is to reach out to family members, and call 911.   Follow Up Instructions:    I discussed the assessment and treatment plan with the patient. The patient was provided an opportunity to ask questions and all were answered. The patient agreed with the plan and demonstrated an understanding of the instructions.   The patient was advised to call back or seek an in-person evaluation if the symptoms worsen or if the condition fails to improve as anticipated.  I provided 20 minutes of non-face-to-face time during this encounter.   Orlene Erm, MD   Orlene Erm, MD 03/05/2019, 10:17 AM   ADDENDUM: Mother called to clarify the medications and their doses. She reported that pt has only been taking Luvox 50 mg  instead of 100 mg daily, recommended Luvox 100 mg daily. Also discussed the change made for Lamictal today. M verbalized understanding.

## 2019-03-05 NOTE — Telephone Encounter (Signed)
Spoke with mother and clarified the med changes.

## 2019-03-05 NOTE — Telephone Encounter (Signed)
pt mother called states she needs to speak with the doctor about the patient medication changes.

## 2019-03-05 NOTE — Addendum Note (Signed)
Addended by: Leotis Shames on: 03/05/2019 03:06 PM   Modules accepted: Orders

## 2019-03-12 ENCOUNTER — Other Ambulatory Visit: Payer: Self-pay

## 2019-03-12 ENCOUNTER — Ambulatory Visit (INDEPENDENT_AMBULATORY_CARE_PROVIDER_SITE_OTHER): Payer: No Typology Code available for payment source | Admitting: Licensed Clinical Social Worker

## 2019-03-12 ENCOUNTER — Encounter: Payer: Self-pay | Admitting: Licensed Clinical Social Worker

## 2019-03-12 DIAGNOSIS — F411 Generalized anxiety disorder: Secondary | ICD-10-CM

## 2019-03-12 DIAGNOSIS — F3181 Bipolar II disorder: Secondary | ICD-10-CM | POA: Diagnosis not present

## 2019-03-12 NOTE — Progress Notes (Signed)
Virtual Visit via Video Note  I connected with Victoria Simmons on 03/12/19 at 10:00 AM EDT by a video enabled telemedicine application and verified that I am speaking with the correct person using two identifiers.   I discussed the limitations of evaluation and management by telemedicine and the availability of in person appointments. The patient expressed understanding and agreed to proceed.   I discussed the assessment and treatment plan with the patient. The patient was provided an opportunity to ask questions and all were answered. The patient agreed with the plan and demonstrated an understanding of the instructions.   The patient was advised to call back or seek an in-person evaluation if the symptoms worsen or if the condition fails to improve as anticipated.  I provided 30 minutes of non-face-to-face time during this encounter.   [name], LCSW    THERAPIST PROGRESS NOTE  Session Time: 1000  Participation Level: Active  Behavioral Response: NeatAlertNA  Type of Therapy: Individual Therapy  Treatment Goals addressed: Coping  Interventions: Supportive  Summary: Victoria Simmons is a 23 y.o. female who presents with continued symptoms related to her diagnosis Victoria Simmons reports doing well since our last session. She reported her anxiety increased last week, but she spoke with MD in the clinic who increased her medications which [name] stated has helped significantly. She reports her mood has been stable and has had no thoughts of self harm or suicide. Victoria Simmons reports she has been planning trips to give herself something to look forward to. She also reports she is going to begin working earlier on two days so she is able to leave earlier in the evenings as well. LCSW validated Victoria Simmons' feelings and improved mood, and highlighted the ways she was able to advocate for herself--which is sincere progress. Victoria Simmons was also able to recognize her progress. She reports she has been getting along  well with her family members, and has not had any issues since our last session. Because of this, we discussed pushing her next appointment out for three weeks. Victoria Simmons was on board with this decision. LCSW held space for Victoria Simmons to express her feelings about her progress. She was able to point out a variety of ways she feels her mental health has improved, but still wishes to continue therapy for "tune ups." LCSW normalized that feeling and encouraged Victoria Simmons to continue journaling in order to keep track of her symptoms over the next three weeks. Victoria Simmons expressed understanding and agreement with this idea.   Suicidal/Homicidal: No  Therapist Response: Victoria Simmons continues to work towards her tx goals but has not yet reached them. We will continue to work on emotional regulation and communication skills moving forward by utilizing CBT.   Plan: Return again in 3 weeks.  Diagnosis: Axis I: Bipolar 2 disorder    Axis II: No diagnosis    [name], LCSW 03/12/2019

## 2019-03-19 ENCOUNTER — Encounter: Payer: Self-pay | Admitting: Child and Adolescent Psychiatry

## 2019-03-19 ENCOUNTER — Other Ambulatory Visit: Payer: Self-pay

## 2019-03-19 ENCOUNTER — Ambulatory Visit (INDEPENDENT_AMBULATORY_CARE_PROVIDER_SITE_OTHER): Payer: No Typology Code available for payment source | Admitting: Child and Adolescent Psychiatry

## 2019-03-19 DIAGNOSIS — F3181 Bipolar II disorder: Secondary | ICD-10-CM | POA: Diagnosis not present

## 2019-03-19 DIAGNOSIS — F411 Generalized anxiety disorder: Secondary | ICD-10-CM | POA: Diagnosis not present

## 2019-03-19 MED ORDER — LAMOTRIGINE 100 MG PO TABS: 150.0000 mg | ORAL_TABLET | Freq: Every day | ORAL | 0 refills | Status: DC

## 2019-03-19 MED ORDER — TRAZODONE HCL 100 MG PO TABS: 150.0000 mg | ORAL_TABLET | Freq: Every day | ORAL | 0 refills | Status: DC

## 2019-03-19 MED ORDER — ARIPIPRAZOLE 10 MG PO TABS: 10.0000 mg | ORAL_TABLET | Freq: Every day | ORAL | 0 refills | Status: DC

## 2019-03-19 MED ORDER — FLUVOXAMINE MALEATE 100 MG PO TABS: 100.0000 mg | ORAL_TABLET | Freq: Every day | ORAL | 0 refills | Status: DC

## 2019-03-19 MED FILL — lamoTRIgine 100 MG TABS: 100 | 30 days supply | Qty: 45 | Fill #0

## 2019-03-19 MED FILL — FLUVOXAMINE MALEATE 100 MG: 100 | 30 days supply | Qty: 30 | Fill #0

## 2019-03-19 MED FILL — traZODone HCL 100 MG TABS: 100 | 30 days supply | Qty: 45 | Fill #0

## 2019-03-19 NOTE — Progress Notes (Signed)
Virtual Visit via Video Note  I connected with Victoria Simmons on 03/19/19 at  9:30 AM EDT by a video enabled telemedicine application and verified that I am speaking with the correct person using two identifiers.  Location: Patient: Home Provider: Office   I discussed the limitations of evaluation and management by telemedicine and the availability of in person appointments. The patient expressed understanding and agreed to proceed.  BH MD/PA/NP OP Progress Note  03/19/2019 10:00 AM Victoria Simmons  MRN:  [id number]  Chief Complaint: Medication management follow up for bipolar disorder, GAD, insomnia.   HPI:  Victoria Simmons is a 23 year old Caucasian female, employed as a Chemical engineer, lives in [place] has a history of bipolar disease disorder type II, GAD, insomnia was seeing Dr. Einar Grad since 2017 and transition to this Probation officer in April 2020.    Writer briefly saw her on the video before switching to telephone encounter because of technical issues.  She reported that she has been doing well since the last visit.she reports that her anxiety, irritability and sleep has improved since the last visit.  She rates her anxiety at 3/10(10 = most anxious) and irritability at 1/10(1= most irritable).  She reports that her mood has been "happy", denies anhedonia, denies any thoughts of suicide or self-harm, denies thoughts of violence.  She reports that she has been tolerating medications well and denies any side effects with them.  She reports that she has gained about 15 pounds since March which she thinks is because of quarantine and not being able to go to gym because of quarantine.  We discussed possibility of Abilify causing weight gain and therefore recommended her to resume physical activity and mindful about her eating habits.  She verbalized understanding and reports that she has already started strength exercise and some cardio.  Writer discussed with her to weigh her self today and prior to next visit to  continue to monitor her weight.  She verbalized understanding.  She denies any new psychosocial stressors except that her work hours have changed and she is adjusting to it.   Visit Diagnosis:    ICD-10-CM   1. Bipolar 2 disorder, major depressive episode (HCC)  F31.81 ARIPiprazole (ABILIFY) 10 MG tablet    lamoTRIgine (LAMICTAL) 100 MG tablet    traZODone (DESYREL) 100 MG tablet  2. GAD (generalized anxiety disorder)  F41.1 ARIPiprazole (ABILIFY) 10 MG tablet    fluvoxaMINE (LUVOX) 100 MG tablet    traZODone (DESYREL) 100 MG tablet    Past Psychiatric History: 1 previous inpatient admission at [facility] intermittently 18, no previous suicide attempts, has history of cutting and her past medication trials include Prozac and Zoloft.  Decreasing Luvox to 50 mg worsened her symptoms of anxiety therefore it was increased back to 100 mg once a day.    Past Medical History:  Past Medical History:  Diagnosis Date  . Anxiety   . Depression   . Dysmenorrhea   . Hypertriglyceridemia 2020  . MDD (major depressive disorder) 06/19/2017  . Ovarian torsion 2008    Past Surgical History:  Procedure Laterality Date  . OVARY SURGERY  06/30/2007   dx laparoscopy, reduction of right adnexal torsion, bilateral oophoropexy of each ovary to posterior uterine fundus    Family Psychiatric History:As mentioned in initial H&P, reviewed today, no change   Family History:  Family History  Problem Relation Age of Onset  . Hepatitis B Mother   . Anxiety disorder Mother   . Hypertension Father   .  ADD / ADHD Brother   . Ovarian cancer Paternal Grandmother     Social History:  Social History   Socioeconomic History  . Marital status: Single    Spouse name: Not on file  . Number of children: 0  . Years of education: Not on file  . Highest education level: Some college, no degree  Occupational History  . 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 Financial resource strain:  Not hard at all  . Food insecurity    Worry: Never true    Inability: Never true  . Transportation needs    Medical: No    Non-medical: No  Tobacco Use  . Smoking status: Never Smoker  . Smokeless tobacco: Never Used  Substance and Sexual Activity  . Alcohol use: Yes    Alcohol/week: 5.0 standard drinks    Types: 5 Glasses of wine per week  . Drug use: No  . Sexual activity: Yes    Birth control/protection: I.U.D.    Comment: Kyleena inserted 02/21/18---Female partner  Lifestyle  . Physical activity    Days per week: 5 days    Minutes per session: 60 min  . Stress: Not at all  Relationships  . Social Herbalist on phone: Three times a week    Gets together: More than three times a week    Attends religious service: Never    Active member of club or organization: No    Attends meetings of clubs or organizations: Never    Relationship status: Never married  Other Topics Concern  . Not on file  Social History Narrative   ** Merged History Encounter **        Allergies: No Known Allergies  Metabolic Disorder Labs: Lab Results  Component Value Date   HGBA1C 4.9 05/14/2017   MPG 94 05/14/2017   No results found for: PROLACTIN Lab Results  Component Value Date   CHOL 149 12/08/2018   TRIG 182 (H) 12/08/2018   HDL 40 12/08/2018   CHOLHDL 3.7 12/08/2018   VLDL 30 05/14/2017   LDLCALC 73 12/08/2018   LDLCALC 102 (H) 11/21/2017   Lab Results  Component Value Date   TSH 0.506 12/08/2018   TSH 3.491 05/14/2017    Therapeutic Level Labs: No results found for: LITHIUM No results found for: VALPROATE No components found for:  CBMZ  Current Medications: Current Outpatient Medications  Medication Sig Dispense Refill  . ARIPiprazole (ABILIFY) 10 MG tablet Take 1 tablet (10 mg total) by mouth daily. 30 tablet 0  . fluvoxaMINE (LUVOX) 100 MG tablet Take 1 tablet (100 mg total) by mouth at bedtime. 30 tablet 0  . lamoTRIgine (LAMICTAL) 100 MG tablet Take  1.5 tablets (150 mg total) by mouth daily. 45 tablet 0  . Levonorgestrel (KYLEENA IU) by Intrauterine route.    . phenazopyridine (PYRIDIUM) 100 MG tablet Take 1 tablet (100 mg total) by mouth 3 (three) times daily as needed for pain (with food). 6 tablet 0  . traZODone (DESYREL) 100 MG tablet Take 1.5 tablets (150 mg total) by mouth at bedtime. 45 tablet 0   No current facility-administered medications for this visit.      Musculoskeletal: Strength & Muscle Tone: unable to assess since visit was over the telemedicine. Gait & Station: unable to assess since visit was over the telemedicine. Patient leans: N/A  Psychiatric Specialty Exam: ROSReview of 12 systems negative except as mentioned in HPI  There were no vitals taken for this visit.There is no height or weight on file to calculate BMI.  General Appearance: Casual  Eye Contact:  Good  Speech:  Clear and Coherent and Normal Rate  Volume:  Normal  Mood:  "happy"  Affect:  Appropriate and Full Range  Thought Process:  Goal Directed and Linear  Orientation:  Full (Time, Place, and Person)  Thought Content: Logical   Suicidal Thoughts:  No  Homicidal Thoughts:  No  Memory:  Immediate;   Fair Recent;   Fair Remote;   Fair  Judgement:  Good  Insight:  Good  Psychomotor Activity:  Normal  Concentration:  Concentration: Good and Attention Span: Good  Recall:  Good  Fund of Knowledge: Good  Language: Good  Akathisia:  No    AIMS (if indicated): not done  Assets:  Communication Skills Desire for Improvement Financial Resources/Insurance Housing Leisure Time Physical Health Social Support Transportation Vocational/Educational  ADL's:  Intact  Cognition: WNL  Sleep:  Good   Screenings: AIMS     Admission (Discharged) from 05/13/2017 in [facility] Total Score  0    AUDIT     Admission (Discharged) from 05/13/2017 in [facility]  Alcohol Use Disorder Identification  Test Final Score (AUDIT)  0    PHQ2-9     Office Visit from 06/19/2017 in [place] [facility]  PHQ-2 Total Score  0  PHQ-9 Total Score  4       Assessment and Plan:   Selen is a 23 yr old Caucasian female who is single, employed, lives in [place], has a history of bipolar disorder, generalized anxiety disorder, insomnia presented to clinic today for a follow-up visit. She was a patient of Dr Einar Grad and transitioned to this Probation officer in April, 2020. Mood and anxiety improved with increasing Luvox back to 100 mg daily and increasing Lamictal to 150 mg daily. \  Plan Bipolar 2 disorder (improving) Continue lamotrigine150 mg p.o. daily. Continue withLuvox 100 mg p.o. daily Continue Abilify at10mg  po qd.  For generalized anxiety disorder-unstable Continue CBT. Luvox as mentioned above.  Medications above will be helpful in decreasing anxiety. Previously tried Prozac upto 10 mg daily and Zoloft 150 mg daily - stopped due to inefficacy.   For insomnia-improved Continue Trazodone at150mg  p.o. nightly  Labs(03/02) CBC - Stable; CMP - Stable; TSH - WNL; Lipid panel - WNL except TG of 182; Glucose - 80  Patient advised to continue psychotherapy sessions with Ms. [name], she has upcoming appointment.  Patient to follow-up in clinic in 4 weeks. If she has suicidal thoughts that she wants to act upon, she is to reach out to family members, and call 911.   Follow Up Instructions:    I discussed the assessment and treatment plan with the patient. The patient was provided an opportunity to ask questions and all were answered. The patient agreed with the plan and demonstrated an understanding of the instructions.   The patient was advised to call back or seek an in-person evaluation if the symptoms worsen or if the condition fails to improve as anticipated   Orlene Erm, MD 03/19/2019, 10:00 AM

## 2019-03-27 MED FILL — ARIPiprazole 10 MG TABS: 10 | 30 days supply | Qty: 30 | Fill #0

## 2019-04-01 ENCOUNTER — Ambulatory Visit (INDEPENDENT_AMBULATORY_CARE_PROVIDER_SITE_OTHER): Payer: No Typology Code available for payment source | Admitting: Licensed Clinical Social Worker

## 2019-04-01 ENCOUNTER — Encounter: Payer: Self-pay | Admitting: Licensed Clinical Social Worker

## 2019-04-01 ENCOUNTER — Other Ambulatory Visit: Payer: Self-pay

## 2019-04-01 DIAGNOSIS — F411 Generalized anxiety disorder: Secondary | ICD-10-CM

## 2019-04-01 DIAGNOSIS — F3181 Bipolar II disorder: Secondary | ICD-10-CM | POA: Diagnosis not present

## 2019-04-01 NOTE — Progress Notes (Signed)
Virtual Visit via Video Note  I connected with Victoria Simmons on 04/01/19 at 10:00 AM EDT by a video enabled telemedicine application and verified that I am speaking with the correct person using two identifiers.   I discussed the limitations of evaluation and management by telemedicine and the availability of in person appointments. The patient expressed understanding and agreed to proceed.   I discussed the assessment and treatment plan with the patient. The patient was provided an opportunity to ask questions and all were answered. The patient agreed with the plan and demonstrated an understanding of the instructions.   The patient was advised to call back or seek an in-person evaluation if the symptoms worsen or if the condition fails to improve as anticipated.  I provided 15 minutes of non-face-to-face time during this encounter.   [name], LCSW    THERAPIST PROGRESS NOTE  Session Time: 1000  Participation Level: Minimal  Behavioral Response: NeatAlertexcited  Type of Therapy: Individual Therapy  Treatment Goals addressed: Anxiety  Interventions: Supportive  Summary: Victoria Simmons is a 23 y.o. female who presents with continued symptoms related to her diagnosis. Victoria Simmons reports doing well since our last session. She reports her mood has been more stable, and she has been adherent with all medications. She reports no suicidal ideation and no thoughts of self harm. She reports her only complaint, which she discussed with the MD, is weight gain associated with Abilify. Victoria Simmons reported feeling frustrated at gaining weight and the gyms not being open in [place] yet. LCSW encouraged Victoria Simmons to begin home workouts. Victoria Simmons reports she has already started doing workouts at home, and continues to do so. LCSW agreed with this plan. Victoria Simmons reports she leaves tomorrow for [place], and is not having anxiety about traveling. She reports feeling excited for her trip, and the ability to see  family members. We discussed ways to manage anxiety should it arise on her trip, such as breathing exercises and distraction. Victoria Simmons expressed understanding and agreement with this idea.   Suicidal/Homicidal: No  Therapist Response: Victoria Simmons continues to work towards her tx goals but has not yet reached them. We will continue to work on emotional regulation skills in order to stay safe/calm in the moment. Victoria Simmons' has improved where distress tolerance is concerned, and she is able to challenge negative thoughts in the moment more effectively.   Plan: Return again in 4 weeks.  Diagnosis: Axis I: Bipolar 2 Disorder    Axis II: No diagnosis    [name], LCSW 04/01/2019

## 2019-04-15 ENCOUNTER — Ambulatory Visit: Payer: No Typology Code available for payment source | Admitting: Child and Adolescent Psychiatry

## 2019-04-16 ENCOUNTER — Telehealth: Payer: Self-pay | Admitting: Obstetrics and Gynecology

## 2019-04-16 ENCOUNTER — Ambulatory Visit (INDEPENDENT_AMBULATORY_CARE_PROVIDER_SITE_OTHER): Payer: No Typology Code available for payment source | Admitting: Obstetrics and Gynecology

## 2019-04-16 ENCOUNTER — Ambulatory Visit (INDEPENDENT_AMBULATORY_CARE_PROVIDER_SITE_OTHER): Payer: No Typology Code available for payment source

## 2019-04-16 ENCOUNTER — Encounter: Payer: Self-pay | Admitting: Obstetrics and Gynecology

## 2019-04-16 ENCOUNTER — Other Ambulatory Visit: Payer: Self-pay

## 2019-04-16 VITALS — BP 100/62 | HR 68 | Temp 97.5°F | Resp 14 | Ht 65.0 in | Wt 173.2 lb

## 2019-04-16 DIAGNOSIS — Z30431 Encounter for routine checking of intrauterine contraceptive device: Secondary | ICD-10-CM | POA: Diagnosis not present

## 2019-04-16 NOTE — Telephone Encounter (Signed)
Patient is having pain with her IUD and is unable to feel the strings.

## 2019-04-16 NOTE — Telephone Encounter (Signed)
Returned call to patient but unable to leave message  "mailbox full".

## 2019-04-16 NOTE — Progress Notes (Signed)
GYNECOLOGY  VISIT   HPI: 23 y.o.   Single  Caucasian  female   [id number] with Patient's last menstrual period was 03/30/2019.   here for ultrasound    Was not able to feel her IUD strings for a week.  Had cramping with prior period.   She was worried about the IUD after having sex with a man, and she could not feel the strings.  Uses condoms.   GYNECOLOGIC HISTORY: Patient's last menstrual period was 03/30/2019. Contraception:  Kyleena IUD Menopausal hormone therapy:  N/a Last mammogram:  n/a Last pap smear:   11/21/17 Negative        OB History    Gravida  0   Para  0   Term  0   Preterm  0   AB  0   Living  0     SAB  0   TAB  0   Ectopic  0   Multiple  0   Live Births  0              Patient Active Problem List   Diagnosis Date Noted  . Bilateral impacted cerumen 04/29/2018  . Acute non-recurrent frontal sinusitis 11/03/2017  . Well woman exam 06/19/2017  . HLD (hyperlipidemia) 06/19/2017  . Bipolar 2 disorder, major depressive episode ([place]) 05/13/2017  . GAD (generalized anxiety disorder) 05/13/2017  . Menorrhagia 01/25/2014  . Anxiety state 01/26/2013  . Family history of hypercholesterolemia 01/06/2013  . Murmur 01/06/2013    Past Medical History:  Diagnosis Date  . Anxiety   . Depression   . Dysmenorrhea   . Hypertriglyceridemia 2020  . MDD (major depressive disorder) 06/19/2017  . Ovarian torsion 2008    Past Surgical History:  Procedure Laterality Date  . OVARY SURGERY  06/30/2007   dx laparoscopy, reduction of right adnexal torsion, bilateral oophoropexy of each ovary to posterior uterine fundus    Current Outpatient Medications  Medication Sig Dispense Refill  . ARIPiprazole (ABILIFY) 10 MG tablet Take 1 tablet (10 mg total) by mouth daily. 30 tablet 0  . fluvoxaMINE (LUVOX) 100 MG tablet Take 1 tablet (100 mg total) by mouth at bedtime. 30 tablet 0  . lamoTRIgine (LAMICTAL) 100 MG tablet Take 1.5 tablets (150 mg total) by  mouth daily. 45 tablet 0  . Levonorgestrel (KYLEENA IU) by Intrauterine route.    . traZODone (DESYREL) 100 MG tablet Take 1.5 tablets (150 mg total) by mouth at bedtime. 45 tablet 0   No current facility-administered medications for this visit.      ALLERGIES: Patient has no known allergies.  Family History  Problem Relation Age of Onset  . Hepatitis B Mother   . Anxiety disorder Mother   . Hypertension Father   . ADD / ADHD Brother   . Ovarian cancer Paternal Grandmother     Social History   Socioeconomic History  . Marital status: Single    Spouse name: Not on file  . Number of children: 0  . Years of education: Not on file  . Highest education level: Some college, no degree  Occupational History  . 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 Financial resource strain: Not hard at all  . Food insecurity    Worry: Never true    Inability: Never true  . Transportation needs    Medical: No    Non-medical: No  Tobacco Use  . Smoking status: Never Smoker  . Smokeless  tobacco: Never Used  Substance and Sexual Activity  . Alcohol use: Yes    Alcohol/week: 5.0 standard drinks    Types: 5 Glasses of wine per week  . Drug use: No  . Sexual activity: Yes    Birth control/protection: I.U.D.    Comment: Kyleena inserted 02/21/18---Female partner  Lifestyle  . Physical activity    Days per week: 5 days    Minutes per session: 60 min  . Stress: Not at all  Relationships  . Social Herbalist on phone: Three times a week    Gets together: More than three times a week    Attends religious service: Never    Active member of club or organization: No    Attends meetings of clubs or organizations: Never    Relationship status: Never married  . Intimate partner violence    Fear of current or ex partner: No    Emotionally abused: No    Physically abused: No    Forced sexual activity: No  Other Topics Concern  . Not on file  Social  History Narrative   ** Merged History Encounter **        Review of Systems  Constitutional: Negative.   HENT: Negative.   Eyes: Negative.   Respiratory: Negative.   Cardiovascular: Negative.   Gastrointestinal: Negative.   Endocrine: Negative.   Genitourinary: Negative.   Musculoskeletal: Negative.   Skin: Negative.   Allergic/Immunologic: Negative.   Neurological: Negative.   Hematological: Negative.   Psychiatric/Behavioral: Negative.     PHYSICAL EXAMINATION:    BP 100/62 (BP Location: Right Arm, Patient Position: Sitting, Cuff Size: Normal)   Pulse 68   Temp (!) 97.5 F (36.4 C) (Temporal)   Resp 14   Ht 5\' 5"  (1.651 m)   Wt 173 lb 3.2 oz (78.6 kg)   LMP 03/30/2019   BMI 28.82 kg/m     General appearance: alert, cooperative and appears stated age  Pelvic US  Uterus no masses.  IUD in endometrial canal.  Normal ovaries.  Bilateral follicles.  Left ovary with increased volume. ?PCOS?   ASSESSMENT  IUD check up.  PLAN  Reassurance regarding IUD position and functionality.  We reviewed IUD removal techniques if strings are not visible with exam.  Condoms recommended.  FU for annual exam and prn.   An After Visit Summary was printed and given to the patient.  __15____ minutes face to face time of which over 50% was spent in counseling.      ADDENDUM; ENDOMETRIUM WITH IUD IN CORRECT POSITION WITHIN EM BILATERALS OVARIES W/ MULTIPLE FOLLICLES, LT OVARY W/ENLARGED VOLUME, CANNOT R/O PCOS

## 2019-04-16 NOTE — Telephone Encounter (Signed)
Return call to patient.  Has had IUD for 1 year and always been able to feel her strings. Approximately 1 week ago, was no longer able to feel string and has noted cramping and light spotting. Reviewed with Dr Quincy Simmonds. Office visit for ultrasound today.   Ultrasound today 3pm, work-in with Dr Quincy Simmonds to follow.

## 2019-04-22 ENCOUNTER — Telehealth: Payer: Self-pay

## 2019-04-22 NOTE — Telephone Encounter (Signed)
Does this person want refills, no one has informed me

## 2019-04-22 NOTE — Telephone Encounter (Signed)
pt called left a message that she needs refill on medication.

## 2019-04-22 NOTE — Telephone Encounter (Signed)
tried to call patient to left her know that she needs to make an appt but there was no answer and voicemaiol box was full

## 2019-04-23 ENCOUNTER — Other Ambulatory Visit (HOSPITAL_COMMUNITY): Payer: Self-pay | Admitting: Psychiatry

## 2019-04-23 DIAGNOSIS — F3181 Bipolar II disorder: Secondary | ICD-10-CM

## 2019-04-23 DIAGNOSIS — F411 Generalized anxiety disorder: Secondary | ICD-10-CM

## 2019-04-23 MED ORDER — TRAZODONE HCL 100 MG PO TABS: 150.0000 mg | ORAL_TABLET | Freq: Every day | ORAL | 0 refills | Status: DC

## 2019-04-23 MED ORDER — LAMOTRIGINE 100 MG PO TABS: 150.0000 mg | ORAL_TABLET | Freq: Every day | ORAL | 0 refills | Status: DC

## 2019-04-23 MED ORDER — FLUVOXAMINE MALEATE 100 MG PO TABS: 100.0000 mg | ORAL_TABLET | Freq: Every day | ORAL | 0 refills | Status: DC

## 2019-04-23 MED ORDER — ARIPIPRAZOLE 10 MG PO TABS: 10.0000 mg | ORAL_TABLET | Freq: Every day | ORAL | 0 refills | Status: DC

## 2019-04-23 MED FILL — traZODone HCL 100 MG TABS: 100 | 30 days supply | Qty: 45 | Fill #0

## 2019-04-23 MED FILL — FLUVOXAMINE MALEATE 100 MG: 100 | 30 days supply | Qty: 30 | Fill #0

## 2019-04-23 MED FILL — lamoTRIgine 100 MG TABS: 100 | 30 days supply | Qty: 45 | Fill #0

## 2019-04-23 MED FILL — ARIPIPRAZOLE 10 MG TABS: 10 | 30 days supply | Qty: 30 | Fill #0

## 2019-04-23 NOTE — Telephone Encounter (Signed)
sent 

## 2019-04-23 NOTE — Telephone Encounter (Signed)
pt made an appt 06-02-19 to see dr. Pricilla Larsson pt states she needs refills on all her medications. please [facility] medication to [facility].

## 2019-04-27 ENCOUNTER — Ambulatory Visit: Payer: No Typology Code available for payment source | Admitting: Licensed Clinical Social Worker

## 2019-05-20 ENCOUNTER — Ambulatory Visit (INDEPENDENT_AMBULATORY_CARE_PROVIDER_SITE_OTHER): Payer: No Typology Code available for payment source | Admitting: Licensed Clinical Social Worker

## 2019-05-20 ENCOUNTER — Telehealth: Payer: Self-pay | Admitting: Child and Adolescent Psychiatry

## 2019-05-20 ENCOUNTER — Other Ambulatory Visit: Payer: Self-pay

## 2019-05-20 ENCOUNTER — Encounter: Payer: Self-pay | Admitting: Licensed Clinical Social Worker

## 2019-05-20 DIAGNOSIS — F411 Generalized anxiety disorder: Secondary | ICD-10-CM | POA: Diagnosis not present

## 2019-05-20 DIAGNOSIS — F3181 Bipolar II disorder: Secondary | ICD-10-CM

## 2019-05-20 MED ORDER — ARIPIPRAZOLE 10 MG PO TABS: 10.0000 mg | ORAL_TABLET | Freq: Every day | ORAL | 0 refills | Status: DC

## 2019-05-20 MED ORDER — FLUVOXAMINE MALEATE 100 MG PO TABS: 100.0000 mg | ORAL_TABLET | Freq: Every day | ORAL | 0 refills | Status: DC

## 2019-05-20 MED ORDER — TRAZODONE HCL 100 MG PO TABS: 150.0000 mg | ORAL_TABLET | Freq: Every day | ORAL | 0 refills | Status: DC

## 2019-05-20 MED ORDER — LAMOTRIGINE 100 MG PO TABS: 150.0000 mg | ORAL_TABLET | Freq: Every day | ORAL | 0 refills | Status: DC

## 2019-05-20 MED FILL — ARIPiprazole 10 MG TABS: 10 | 30 days supply | Qty: 30 | Fill #0

## 2019-05-20 MED FILL — FLUVOXAMINE MALEATE 100 MG: 100 | 30 days supply | Qty: 30 | Fill #0

## 2019-05-20 MED FILL — traZODone HCL 100 MG TABS: 100 | 30 days supply | Qty: 45 | Fill #0

## 2019-05-20 MED FILL — lamoTRIgine 100 MG TABS: 100 | 30 days supply | Qty: 45 | Fill #0

## 2019-05-20 NOTE — Progress Notes (Signed)
  Virtual Visit via Video Note  I connected with Victoria Simmons on 05/20/19 at  9:00 AM EDT by a video enabled telemedicine application and verified that I am speaking with the correct person using two identifiers.   I discussed the limitations of evaluation and management by telemedicine and the availability of in person appointments. The patient expressed understanding and agreed to proceed.    I discussed the assessment and treatment plan with the patient. The patient was provided an opportunity to ask questions and all were answered. The patient agreed with the plan and demonstrated an understanding of the instructions.   The patient was advised to call back or seek an in-person evaluation if the symptoms worsen or if the condition fails to improve as anticipated.  I provided 30 minutes of non-face-to-face time during this encounter.   [name], LCSW   THERAPIST PROGRESS NOTE  Session Time: 0900  Participation Level: Active  Behavioral Response: CasualAlertNA  Type of Therapy: Individual Therapy  Treatment Goals addressed: Anxiety  Interventions: Supportive  Summary: Victoria Simmons is a 23 y.o. female who presents with continued symptoms of her diagnosis. Victoria Simmons reports doing well since her last session. She reports she recently purchased a tiny home and is planning to put it on her family's property. Victoria Simmons expressed feeling nervous about the transition but stated she was very excited. LCSW validated these feelings but encouraged Victoria Simmons to focus on the positives, like increased independence. Victoria Simmons went on to discuss how she has been seeing a new woman, which she is very excited about. She reports they've spent a lot of time together over the last month, and she is very happy about where the relationship is going. She reports the woman is going to meet her family this weekend, which Victoria Simmons reports being very anxious about. LCSW encouraged Kaydince to challenge negative thoughts  associated with this step in the relationship--and talk to her parents about her worries. Victoria Simmons expressed understanding and agreement with this idea. We reviewed ways to challenge negative thoughts associated with her new relationship and the tiny home, and walked through several examples. Victoria Simmons also reported feeling like, at times, she is not making a lot of progress with her mental health. LCSW asked Victoria Simmons to list ways she has made progress. Victoria Simmons was able to do this easily and expressed feeling more positive after doing so. LCSW encouraged Victoria Simmons to make an ongoing list with her progress on her phone so she is able to refer to it when she is feeling disheartened. Victoria Simmons expressed understanding and agreement.   Suicidal/Homicidal: No  Therapist Response: Victoria Simmons continues to work towards her tx goals but has not yet reached them. We will continue to work on emotional regulation skills moving forward.   Plan: Return again in 4 weeks.  Diagnosis: Axis I: Generalized Anxiety Disorder    Axis II: No diagnosis    [name], LCSW 05/20/2019

## 2019-05-20 NOTE — Telephone Encounter (Signed)
Pt requested refills through therapist. Rx sent. Pt informed.

## 2019-06-01 ENCOUNTER — Ambulatory Visit: Payer: No Typology Code available for payment source

## 2019-06-02 ENCOUNTER — Ambulatory Visit (INDEPENDENT_AMBULATORY_CARE_PROVIDER_SITE_OTHER): Payer: No Typology Code available for payment source | Admitting: Child and Adolescent Psychiatry

## 2019-06-02 ENCOUNTER — Other Ambulatory Visit: Payer: Self-pay

## 2019-06-02 DIAGNOSIS — Z79899 Other long term (current) drug therapy: Secondary | ICD-10-CM

## 2019-06-02 DIAGNOSIS — F3181 Bipolar II disorder: Secondary | ICD-10-CM

## 2019-06-02 DIAGNOSIS — F411 Generalized anxiety disorder: Secondary | ICD-10-CM | POA: Diagnosis not present

## 2019-06-02 MED ORDER — ARIPIPRAZOLE 10 MG PO TABS: 10.0000 mg | ORAL_TABLET | Freq: Every day | ORAL | 0 refills | Status: DC

## 2019-06-02 MED ORDER — LAMOTRIGINE 100 MG PO TABS: 150.0000 mg | ORAL_TABLET | Freq: Every day | ORAL | 0 refills | Status: DC

## 2019-06-02 MED ORDER — TRAZODONE HCL 100 MG PO TABS: 150.0000 mg | ORAL_TABLET | Freq: Every day | ORAL | 0 refills | Status: DC

## 2019-06-02 MED ORDER — FLUVOXAMINE MALEATE 100 MG PO TABS: 100.0000 mg | ORAL_TABLET | Freq: Every day | ORAL | 0 refills | Status: DC
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
## 2019-06-03 ENCOUNTER — Telehealth: Payer: Self-pay

## 2019-06-03 NOTE — Telephone Encounter (Signed)
Called patient and scheduled 2nd Gardasil on 06-08-19 3:15pm.  Patient has [place] IUD for contraception.

## 2019-06-03 NOTE — Telephone Encounter (Signed)
-----   Message from Nunzio Cobbs, MD sent at 05/27/2019 12:52 PM EDT ----- Regarding: RE: Gardasil series I agree with calling her.  I made a sticky note for me also.   Brook  ----- Message ----- From: Huey Romans, RN Sent: 05/27/2019  11:18 AM EDT To: Brook Oletta Lamas, MD, # Subject: Gardasil series                                This patient started Gardasil in March and has missed 2nd injection due to Covid.   She came in for recheck if IUD in July and we missed Gardasil.   She has no further appointments scheduled.   Can we please follow-up with her and offer appointment for Gardasil.  Dr Quincy Simmonds, so you want to add to sticky note?   Thank you all!

## 2019-06-08 ENCOUNTER — Other Ambulatory Visit: Payer: Self-pay

## 2019-06-08 ENCOUNTER — Ambulatory Visit (INDEPENDENT_AMBULATORY_CARE_PROVIDER_SITE_OTHER): Payer: No Typology Code available for payment source

## 2019-06-08 VITALS — BP 106/78 | Temp 97.4°F | Ht 65.0 in | Wt 177.4 lb

## 2019-06-08 DIAGNOSIS — Z23 Encounter for immunization: Secondary | ICD-10-CM | POA: Diagnosis not present

## 2019-06-08 NOTE — Progress Notes (Signed)
Patient in today for 2nd Gardasil injection.   Contraception: Kyleena IUD LMP: 05/27/2019 Last AEX: 12/08/2018 with next AEX: 12/14/2019  Injection given in right deltoid. Patient tolerated shot well.   Patient informed next injection due in about  4 months.  Advised patient, if not on birth control, to return for next injection with cycle.   Routed to provider for final review.  Encounter closed.

## 2019-06-22 ENCOUNTER — Encounter: Payer: Self-pay | Admitting: Licensed Clinical Social Worker

## 2019-06-22 ENCOUNTER — Other Ambulatory Visit: Payer: Self-pay

## 2019-06-22 ENCOUNTER — Telehealth: Payer: Self-pay | Admitting: Child and Adolescent Psychiatry

## 2019-06-22 ENCOUNTER — Ambulatory Visit (INDEPENDENT_AMBULATORY_CARE_PROVIDER_SITE_OTHER): Payer: No Typology Code available for payment source | Admitting: Licensed Clinical Social Worker

## 2019-06-22 DIAGNOSIS — F411 Generalized anxiety disorder: Secondary | ICD-10-CM | POA: Diagnosis not present

## 2019-06-22 DIAGNOSIS — F3181 Bipolar II disorder: Secondary | ICD-10-CM | POA: Diagnosis not present

## 2019-06-22 MED ORDER — LAMOTRIGINE 100 MG PO TABS: 150.0000 mg | ORAL_TABLET | Freq: Every day | ORAL | 0 refills | Status: DC

## 2019-06-22 MED ORDER — FLUVOXAMINE MALEATE 100 MG PO TABS: 100.0000 mg | ORAL_TABLET | Freq: Every day | ORAL | 0 refills | Status: DC

## 2019-06-22 MED ORDER — ARIPIPRAZOLE 10 MG PO TABS: 10.0000 mg | ORAL_TABLET | Freq: Every day | ORAL | 0 refills | Status: DC

## 2019-06-22 MED ORDER — TRAZODONE HCL 100 MG PO TABS: 150.0000 mg | ORAL_TABLET | Freq: Every day | ORAL | 0 refills | Status: DC

## 2019-06-22 MED FILL — lamoTRIgine 100 MG TABS: 100 | 30 days supply | Qty: 45 | Fill #0

## 2019-06-22 MED FILL — FLUVOXAMINE MALEATE 100 MG: 100 | 30 days supply | Qty: 30 | Fill #0

## 2019-06-22 MED FILL — ARIPIPRAZOLE 10 MG TABS: 10 | 30 days supply | Qty: 30 | Fill #0

## 2019-06-22 MED FILL — traZODone HCL 100 MG TABS: 100 | 30 days supply | Qty: 45 | Fill #0
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
## 2019-06-22 NOTE — Telephone Encounter (Signed)
Refills requested through therapist. Sent rx.

## 2019-07-22 ENCOUNTER — Encounter: Payer: Self-pay | Admitting: Child and Adolescent Psychiatry

## 2019-07-22 ENCOUNTER — Ambulatory Visit (INDEPENDENT_AMBULATORY_CARE_PROVIDER_SITE_OTHER): Payer: No Typology Code available for payment source | Admitting: Child and Adolescent Psychiatry

## 2019-07-22 ENCOUNTER — Other Ambulatory Visit: Payer: Self-pay

## 2019-07-22 DIAGNOSIS — F411 Generalized anxiety disorder: Secondary | ICD-10-CM

## 2019-07-22 DIAGNOSIS — F3181 Bipolar II disorder: Secondary | ICD-10-CM | POA: Diagnosis not present

## 2019-07-22 MED ORDER — ARIPIPRAZOLE 10 MG PO TABS: 10.0000 mg | ORAL_TABLET | Freq: Every day | ORAL | 1 refills | Status: DC

## 2019-07-22 MED ORDER — FLUVOXAMINE MALEATE 100 MG PO TABS: 100.0000 mg | ORAL_TABLET | Freq: Every day | ORAL | 1 refills | Status: DC

## 2019-07-22 MED ORDER — LAMOTRIGINE 100 MG PO TABS: 150.0000 mg | ORAL_TABLET | Freq: Every day | ORAL | 1 refills | Status: DC

## 2019-07-22 MED ORDER — TRAZODONE HCL 100 MG PO TABS: 150.0000 mg | ORAL_TABLET | Freq: Every day | ORAL | 1 refills | Status: DC

## 2019-07-22 MED FILL — traZODone HCL 100 MG TABS: 100 | 30 days supply | Qty: 45 | Fill #0

## 2019-07-22 MED FILL — FLUVOXAMINE MALEATE 100 MG: 100 | 30 days supply | Qty: 30 | Fill #0

## 2019-07-22 MED FILL — lamoTRIgine 100 MG TABS: 100 | 30 days supply | Qty: 45 | Fill #0

## 2019-07-22 MED FILL — ARIPiprazole 10 MG TABS: 10 | 30 days supply | Qty: 30 | Fill #0

## 2019-07-22 NOTE — Progress Notes (Signed)
Virtual Visit via Video Note  I connected with Victoria Simmons on 07/22/19 at 10:00 AM EDT by a video enabled telemedicine application and verified that I am speaking with the correct person using two identifiers.  Location: Patient: Home Provider: Office   I discussed the limitations of evaluation and management by telemedicine and the availability of in person appointments. The patient expressed understanding and agreed to proceed.  BH MD/PA/NP OP Progress Note  07/22/2019 11:59 AM Victoria Simmons  MRN:  [id number]  Chief Complaint: Medication management follow up bipolar disorder, GAD, and insomnia.   Synopsis: Victoria Simmons is a 23 year old Caucasian female, employed at daycare, with history of bipolar disorder type II, GAD, insomnia was seen by Dr. Einar Grad since 2017 and transition to this writer in April 2020.  She is currently taking Lamictal 150 mg once a day, Abilify 10 mg once a day, trazodone 150 mg once a day and Luvox 100 mg once a day.  HPI: Victoria Simmons was seen and evaluated over telemedicine encounter. She denies any new concerns for today's visit. She reports that she is doing well overall, her mood has been "good" and she is looking forward to move out of her parent's home to a "tiny home". She reports that her anxiety has increased slightly because she is assigned the23 years old class by her self but able to manage it. She reports that she has had urges to cut self 3-4 times in last month, but has not engaged and reports that she knows it does not help. She denies any thoughts of suicide, continues sleep well, appetite has been normal and goes to gym 3x a week. Reports that she has lost about 10 lbs recently from 170 lbs to about 160 lbs. She reports that she is working on eating better. She has been following up with [name] regularly and has an appointment tomorrow. She reports regularly taking medications and denies any problems with them. She does not feel the need to adjust her meds and  reports that she is overall feeling good with current meds.    Visit Diagnosis:    ICD-10-CM   1. GAD (generalized anxiety disorder)  F41.1 ARIPiprazole (ABILIFY) 10 MG tablet    fluvoxaMINE (LUVOX) 100 MG tablet    traZODone (DESYREL) 100 MG tablet  2. Bipolar 2 disorder, major depressive episode (HCC)  F31.81 ARIPiprazole (ABILIFY) 10 MG tablet    lamoTRIgine (LAMICTAL) 100 MG tablet    traZODone (DESYREL) 100 MG tablet    Past Psychiatric History: PPHx reviewed today and as following. 1 previous inpatient admission at [facility] intermittently 23, no previous suicide attempts, has history of cutting and her past medication trials include Prozac and Zoloft.  Decreasing Luvox to 50 mg worsened her symptoms of anxiety therefore it was increased back to 100 mg once a day.   Previous trials of Zoloft 150 mg and Prozac 10 mg stopped due to inefficacy.   Past Medical History:  Past Medical History:  Diagnosis Date  . Anxiety   . Depression   . Dysmenorrhea   . Hypertriglyceridemia 2020  . MDD (major depressive disorder) 06/19/2017  . Ovarian torsion 2008    Past Surgical History:  Procedure Laterality Date  . OVARY SURGERY  06/30/2007   dx laparoscopy, reduction of right adnexal torsion, bilateral oophoropexy of each ovary to posterior uterine fundus    Family Psychiatric History: As mentioned in initial H&P, reviewed today, no change Family History:  Family History  Problem Relation Age  of Onset  . Hepatitis B Mother   . Anxiety disorder Mother   . Hypertension Father   . ADD / ADHD Brother   . Ovarian cancer Paternal Grandmother     Social History:  Social History   Socioeconomic History  . Marital status: Single    Spouse name: Not on file  . Number of children: 0  . Years of education: Not on file  . Highest education level: Some college, no degree  Occupational History  . 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 Financial resource strain:  Not hard at all  . Food insecurity    Worry: Never true    Inability: Never true  . Transportation needs    Medical: No    Non-medical: No  Tobacco Use  . Smoking status: Never Smoker  . Smokeless tobacco: Never Used  Substance and Sexual Activity  . Alcohol use: Yes    Alcohol/week: 5.0 standard drinks    Types: 5 Glasses of wine per week  . Drug use: No  . Sexual activity: Yes    Birth control/protection: I.U.D.    Comment: Victoria Simmons inserted 02/21/18---Female partner  Lifestyle  . Physical activity    Days per week: 5 days    Minutes per session: 60 min  . Stress: Not at all  Relationships  . Social Herbalist on phone: Three times a week    Gets together: More than three times a week    Attends religious service: Never    Active member of club or organization: No    Attends meetings of clubs or organizations: Never    Relationship status: Never married  Other Topics Concern  . Not on file  Social History Narrative   ** Merged History Encounter **        Allergies: No Known Allergies  Metabolic Disorder Labs: Lab Results  Component Value Date   HGBA1C 4.9 05/14/2017   MPG 94 05/14/2017   No results found for: PROLACTIN Lab Results  Component Value Date   CHOL 149 12/08/2018   TRIG 182 (H) 12/08/2018   HDL 40 12/08/2018   CHOLHDL 3.7 12/08/2018   VLDL 30 05/14/2017   LDLCALC 73 12/08/2018   LDLCALC 102 (H) 11/21/2017   Lab Results  Component Value Date   TSH 0.506 12/08/2018   TSH 3.491 05/14/2017    Therapeutic Level Labs: No results found for: LITHIUM No results found for: VALPROATE No components found for:  CBMZ  Current Medications: Current Outpatient Medications  Medication Sig Dispense Refill  . ARIPiprazole (ABILIFY) 10 MG tablet Take 1 tablet (10 mg total) by mouth daily. 30 tablet 1  . fluvoxaMINE (LUVOX) 100 MG tablet Take 1 tablet (100 mg total) by mouth at bedtime. 30 tablet 1  . lamoTRIgine (LAMICTAL) 100 MG tablet Take  1.5 tablets (150 mg total) by mouth daily. 45 tablet 1  . Levonorgestrel (Victoria Simmons IU) by Intrauterine route.    . traZODone (DESYREL) 100 MG tablet Take 1.5 tablets (150 mg total) by mouth at bedtime. 45 tablet 1   No current facility-administered medications for this visit.      Musculoskeletal: Strength & Muscle Tone: unable to assess since visit was over the telemedicine. Gait & Station: unable to assess since visit was over the telemedicine. Patient leans: N/A  Psychiatric Specialty Exam: ROSReview of 12 systems negative except as mentioned in HPI   There were no vitals taken for  this visit.There is no height or weight on file to calculate BMI.  General Appearance: Casual  Eye Contact:  Good  Speech:  Clear and Coherent and Normal Rate  Volume:  Normal  Mood: "good"  Affect:  Appropriate and Full Range  Thought Process:  Goal Directed and Linear  Orientation:  Full (Time, Place, and Person)  Thought Content: Logical   Suicidal Thoughts:  No  Homicidal Thoughts:  No  Memory:  Immediate;   Fair Recent;   Fair Remote;   Fair  Judgement:  Good  Insight:  Good  Psychomotor Activity:  Normal  Concentration:  Concentration: Good and Attention Span: Good  Recall:  Good  Fund of Knowledge: Good  Language: Good  Akathisia:  No    AIMS (if indicated): not done  Assets:  Communication Skills Desire for Improvement Financial Resources/Insurance Housing Leisure Time Physical Health Social Support Transportation Vocational/Educational  ADL's:  Intact  Cognition: WNL  Sleep:  Good   Screenings: AIMS     Admission (Discharged) from 05/13/2017 in [place] Total Score  0    AUDIT     Admission (Discharged) from 05/13/2017 in [place]  Alcohol Use Disorder Identification Test Final Score (AUDIT)  0    PHQ2-9     Office Visit from 06/19/2017 in [place] at [facility]  PHQ-2 Total Score  0  PHQ-9 Total  Score  4       Assessment and Plan:   Dorin is a 23 yr old Caucasian female who is single, employed, lives in [place], has a history of bipolar disorder, generalized anxiety disorder, insomnia evaluated today for a follow-up visit. She was a patient of Dr Einar Grad and transitioned to this Probation officer in April, 2020. Mood and anxiety improved with Luvox 100 mg daily and Lamictal to 150 mg daily.  Reviewed response to current medication and it appears that her mood has been stable but anxiety appears to have increased recently in the context of work, her insomnia has improved.  Recommending to continue medicines, discussed to monitor anxiety and adjust meds accordingly, provided psychoeducation healthy lifestyle modification.  Recommended labs for the next visit.  Plan as below.    Plan Bipolar 2 disorder (improving) Continue lamotrigine150 mg p.o. daily. Continue withLuvox 100 mg p.o. daily Continue Abilify at10mg  po qd.  For generalized anxiety disorder-unstable Continue CBT. Luvox as mentioned above.  Medications above will be helpful in decreasing anxiety. Previously tried Prozac upto 10 mg daily and Zoloft 150 mg daily - stopped due to inefficacy.   For insomnia-improved Continue Trazodone at150mg  p.o. nightly  Labs(03/02) CBC - Stable; CMP - Stable; TSH - WNL; Lipid panel - WNL except TG of 182; Glucose - 80 Labs ordered during the last visit, has not done them yet, resending the request, and recommended to obtain them before next visit. Discussed that she will have to fast overnight for the blood work.   Patient advised to continue psychotherapy sessions with Ms. [name], she has upcoming appointment tomorrow, recommended to increase the frequency due to increased anxiety recently and urges to cut.   Patient to follow-up in clinic in 4 weeks. If she has suicidal thoughts that she wants to act upon, she is to reach out to family members, and call 911.    Follow Up  Instructions:    I discussed the assessment and treatment plan with the patient. The patient was provided an opportunity to ask questions and all  were answered. The patient agreed with the plan and demonstrated an understanding of the instructions.   The patient was advised to call back or seek an in-person evaluation if the symptoms worsen or if the condition fails to improve as anticipated   Orlene Erm, MD 07/22/2019, 11:59 AM

## 2019-07-23 ENCOUNTER — Encounter: Payer: Self-pay | Admitting: Licensed Clinical Social Worker

## 2019-07-23 ENCOUNTER — Ambulatory Visit (INDEPENDENT_AMBULATORY_CARE_PROVIDER_SITE_OTHER): Payer: No Typology Code available for payment source | Admitting: Licensed Clinical Social Worker

## 2019-07-23 DIAGNOSIS — F411 Generalized anxiety disorder: Secondary | ICD-10-CM | POA: Diagnosis not present

## 2019-07-23 DIAGNOSIS — F3181 Bipolar II disorder: Secondary | ICD-10-CM

## 2019-07-23 NOTE — Progress Notes (Signed)
Virtual Visit via Video Note  I connected with RUCHY UMBACH on 07/23/19 at 12:30 PM EDT by a video enabled telemedicine application and verified that I am speaking with the correct person using two identifiers.   I discussed the limitations of evaluation and management by telemedicine and the availability of in person appointments. The patient expressed understanding and agreed to proceed.  I discussed the assessment and treatment plan with the patient. The patient was provided an opportunity to ask questions and all were answered. The patient agreed with the plan and demonstrated an understanding of the instructions.   The patient was advised to call back or seek an in-person evaluation if the symptoms worsen or if the condition fails to improve as anticipated.  I provided 40 minutes of non-face-to-face time during this encounter.   [name], LCSW    THERAPIST PROGRESS NOTE  Session Time: 1230  Participation Level: Active  Behavioral Response: CasualAlertAnxious  Type of Therapy: Individual Therapy  Treatment Goals addressed: Coping  Interventions: Supportive  Summary: ADYSIN SPITALE is a 23 y.o. female who presents with continued symptoms related to her diagnosis. Milianna reports doing well since our last session. She reports a lot of positives have taken place since our last check in. She reports her tiny home is coming on 10/20, and she is excited to move into her own place,. She reports things with her family and friends are going well, and she has been able to manage her anxiety when issues come up in her friendships. 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 Yetunde reports she has always identified as bisexual, but she wonders if she is actually a-sexual as she does not feel attracted to anyone. LCSW validated that idea, and encouraged Keya to view sexuality as a spectrum,  and just because she identifies one way now, doesn't mean that won't or cannot change later. LCSW encouraged Kaliyan to write out who she is--at her core--and refer to that when she is feeling unsure of her identity outside of her sexual orientation. Katalia expressed understanding and agreement with this idea. 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 LCSW encouraged Reis to utilize her coping skills, as well as discuss her stress level with her bosses at work in order to stay ahead of her stress. Brownie expressed agreeement.   Suicidal/Homicidal: No  Therapist Response: Clarann continues to work towards her tx goals but has not yet reached them. We will continue to work on emotional regulation skills moving forward.   Plan: Return again in 4 weeks.  Diagnosis: Axis I: Generalized Anxiety Disorder    Axis II: No diagnosis    [name], LCSW 07/23/2019

## 2019-08-18 MED FILL — lamoTRIgine 100 MG TABS: 100 | 30 days supply | Qty: 45 | Fill #0

## 2019-08-18 MED FILL — FLUVOXAMINE MALEATE 100 MG: 100 | 30 days supply | Qty: 30 | Fill #0

## 2019-08-18 MED FILL — traZODone HCL 100 MG TABS: 100 | 30 days supply | Qty: 45 | Fill #0

## 2019-08-18 MED FILL — ARIPiprazole 10 MG TABS: 10 | 30 days supply | Qty: 30 | Fill #1

## 2019-08-27 ENCOUNTER — Ambulatory Visit (INDEPENDENT_AMBULATORY_CARE_PROVIDER_SITE_OTHER): Payer: No Typology Code available for payment source | Admitting: Licensed Clinical Social Worker

## 2019-08-27 ENCOUNTER — Encounter: Payer: Self-pay | Admitting: Licensed Clinical Social Worker

## 2019-08-27 ENCOUNTER — Other Ambulatory Visit: Payer: Self-pay

## 2019-08-27 DIAGNOSIS — F3181 Bipolar II disorder: Secondary | ICD-10-CM

## 2019-08-27 DIAGNOSIS — F411 Generalized anxiety disorder: Secondary | ICD-10-CM

## 2019-08-27 NOTE — Progress Notes (Signed)
Virtual Visit via Telephone Note  I connected with BRIAN ORREN on 08/27/19 at 11:00 AM EST by telephone and verified that I am speaking with the correct person using two identifiers.   I discussed the limitations, risks, security and privacy concerns of performing an evaluation and management service by telephone and the availability of in person appointments. I also discussed with the patient that there may be a patient responsible charge related to this service. The patient expressed understanding and agreed to proceed.    I discussed the assessment and treatment plan with the patient. The patient was provided an opportunity to ask questions and all were answered. The patient agreed with the plan and demonstrated an understanding of the instructions.   The patient was advised to call back or seek an in-person evaluation if the symptoms worsen or if the condition fails to improve as anticipated.  I provided 30 minutes of non-face-to-face time during this encounter.   [name], LCSW    THERAPIST PROGRESS NOTE  Session Time: 1100  Participation Level: Active  Behavioral Response: NeatAlertNA  Type of Therapy: Individual Therapy  Treatment Goals addressed: Coping  Interventions: Supportive  Summary: COPELYNN CHIODI is a 23 y.o. female who presents with continued symptoms related to her diagnosis. Birdie reports doing well since our last session. Between sessions, Klea reached out to LCSW when she was feeling depressed and having suicidal thoughts. LCSW encouraged Kassidie to go to the ER if she was feeling unsafe, and ensured she was safe at that time. Millisa reports since then, she has gone on vacation to [place] which she noted was a big boost for her mood. She reports she was able to manage her negative thoughts during that time by talking things through with her parents. LCSW validated this experience and held space for Amali to discuss her feelings. We discussed how  often there will be ups and downs, and utilizing resources (such as reaching out to LCSW) are important to remember, which Nelida did. Kamelia was able to recognize this progress as well. Gorgeous reports one big piece of news is she is moving to [place] in February to stay with her cousin. She reports trying to 'get her ducks in a row," for moving down, such as finding a job, Insurance underwriter, Social research officer, government. LCSW highlighted how Jordin does well when she has things to look forward to, and perhaps a change of scenery will be nice. LCSW also encouraged Abegail to recognize whatever mental health concerns she has here will not go away when she moves to [place], and she will need to continue working on those. Jsutes expressed understanding and agreement with this information. as well.   Suicidal/Homicidal: No  Therapist Response: Jolin reports doing well since our last session. Elin continues to work towards her tx goals but has not yet reached them. We will continue to work on improving CBT skills moving forward and improving distress tolerance.   Plan: Return again in 4 weeks.  Diagnosis: Axis I: Bipolar 2 Disorder    Axis II: No diagnosis    [name], LCSW 08/27/2019

## 2019-09-16 ENCOUNTER — Encounter: Payer: Self-pay | Admitting: Child and Adolescent Psychiatry

## 2019-09-16 ENCOUNTER — Ambulatory Visit (INDEPENDENT_AMBULATORY_CARE_PROVIDER_SITE_OTHER): Payer: No Typology Code available for payment source | Admitting: Child and Adolescent Psychiatry

## 2019-09-16 ENCOUNTER — Other Ambulatory Visit: Payer: Self-pay

## 2019-09-16 DIAGNOSIS — F411 Generalized anxiety disorder: Secondary | ICD-10-CM

## 2019-09-16 DIAGNOSIS — F3181 Bipolar II disorder: Secondary | ICD-10-CM

## 2019-09-16 MED ORDER — TRAZODONE HCL 100 MG PO TABS: 150.0000 mg | ORAL_TABLET | Freq: Every day | ORAL | 1 refills | Status: DC

## 2019-09-16 MED ORDER — LAMOTRIGINE 100 MG PO TABS: 150.0000 mg | ORAL_TABLET | Freq: Every day | ORAL | 1 refills | Status: DC

## 2019-09-16 MED ORDER — FLUVOXAMINE MALEATE 100 MG PO TABS: 100.0000 mg | ORAL_TABLET | Freq: Every day | ORAL | 1 refills | Status: DC

## 2019-09-16 MED ORDER — ARIPIPRAZOLE 10 MG PO TABS: 10.0000 mg | ORAL_TABLET | Freq: Every day | ORAL | 1 refills | Status: DC

## 2019-09-16 MED FILL — FLUVOXAMINE MALEATE 100 MG: 100 | 30 days supply | Qty: 30 | Fill #0

## 2019-09-16 MED FILL — lamoTRIgine 100 MG TABS: 100 | 30 days supply | Qty: 45 | Fill #0

## 2019-09-16 MED FILL — traZODone HCL 100 MG TABS: 100 | 30 days supply | Qty: 45 | Fill #0

## 2019-09-16 MED FILL — ARIPiprazole 10 MG TABS: 10 | 30 days supply | Qty: 30 | Fill #0

## 2019-09-16 NOTE — Progress Notes (Signed)
Virtual Visit via Video Note  I connected with Victoria Simmons on 09/16/19 at  2:00 PM EST by a video enabled telemedicine application and verified that I am speaking with the correct person using two identifiers.  Location: Patient: Home Provider: Office   I discussed the limitations of evaluation and management by telemedicine and the availability of in person appointments. The patient expressed understanding and agreed to proceed.  BH MD/PA/NP OP Progress Note  09/16/2019 2:22 PM Victoria Simmons  MRN:  [id number]  Chief Complaint: Medication management follow-up for bipolar disorder, GAD, and insomnia.    Synopsis: Victoria Simmons is a 23 year old Caucasian female, employed at daycare, with history of bipolar disorder type II, GAD, insomnia was seen by Dr. Einar Grad since 2017 and transition to this writer in April 2020.  She is currently taking Lamictal 150 mg once a day, Abilify 10 mg once a day, trazodone 150 mg once a day and Luvox 100 mg once a day.  HPI: Victoria Simmons was seen and evaluated over telemedicine encounter for medication management follow-up.  She denies any new concerns for today's visit and reports that her mood has been very good, anxiety has been stable, sleeping very well, eating better and reports that her current weight is about 160 lbs. She denies any SI or self harm thoughts. Denies any new psychosocial stressors. She continues to work at the daycare, and is planning to move to [place] with her cousin sometime in February. She reports that she is in much better headspace for the move to [facility].  She reports that she has been regularly taking her medications and denies any side effects from them.   Visit Diagnosis:    ICD-10-CM   1. Bipolar 2 disorder, major depressive episode (HCC)  F31.81 ARIPiprazole (ABILIFY) 10 MG tablet    lamoTRIgine (LAMICTAL) 100 MG tablet    traZODone (DESYREL) 100 MG tablet  2. GAD (generalized anxiety disorder)  F41.1 ARIPiprazole (ABILIFY) 10 MG tablet   fluvoxaMINE (LUVOX) 100 MG tablet    traZODone (DESYREL) 100 MG tablet    Past Psychiatric History: PPHx reviewed today and as followin. 1 previous inpatient admission at [facility] intermittently 18, no previous suicide attempts, has history of cutting and her past medication trials include Prozac and Zoloft.  Decreasing Luvox to 50 mg worsened her symptoms of anxiety therefore it was increased back to 100 mg once a day.   Previous trials of Zoloft 150 mg and Prozac 10 mg stopped due to inefficacy.   Past Medical History:  Past Medical History:  Diagnosis Date  . Anxiety   . Depression   . Dysmenorrhea   . Hypertriglyceridemia 2020  . MDD (major depressive disorder) 06/19/2017  . Ovarian torsion 2008    Past Surgical History:  Procedure Laterality Date  . OVARY SURGERY  06/30/2007   dx laparoscopy, reduction of right adnexal torsion, bilateral oophoropexy of each ovary to posterior uterine fundus    Family Psychiatric History: As mentioned in initial H&P, reviewed today, no change Family History:  Family History  Problem Relation Age of Onset  . Hepatitis B Mother   . Anxiety disorder Mother   . Hypertension Father   . ADD / ADHD Brother   . Ovarian cancer Paternal Grandmother     Social History:  Social History   Socioeconomic History  . Marital status: Single    Spouse name: Not on file  . Number of children: 0  . Years of education: Not on file  . Highest  education level: Some college, no degree  Occupational History  . 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 Financial resource strain: Not hard at all  . Food insecurity    Worry: Never true    Inability: Never true  . Transportation needs    Medical: No    Non-medical: No  Tobacco Use  . Smoking status: Never Smoker  . Smokeless tobacco: Never Used  Substance and Sexual Activity  . Alcohol use: Yes    Alcohol/week: 5.0 standard drinks    Types: 5 Glasses of wine per week  . Drug  use: No  . Sexual activity: Yes    Birth control/protection: I.U.D.    Comment: Kyleena inserted 02/21/18---Female partner  Lifestyle  . Physical activity    Days per week: 5 days    Minutes per session: 60 min  . Stress: Not at all  Relationships  . Social Herbalist on phone: Three times a week    Gets together: More than three times a week    Attends religious service: Never    Active member of club or organization: No    Attends meetings of clubs or organizations: Never    Relationship status: Never married  Other Topics Concern  . Not on file  Social History Narrative   ** Merged History Encounter **        Allergies: No Known Allergies  Metabolic Disorder Labs: Lab Results  Component Value Date   HGBA1C 4.9 05/14/2017   MPG 94 05/14/2017   No results found for: PROLACTIN Lab Results  Component Value Date   CHOL 149 12/08/2018   TRIG 182 (H) 12/08/2018   HDL 40 12/08/2018   CHOLHDL 3.7 12/08/2018   VLDL 30 05/14/2017   LDLCALC 73 12/08/2018   LDLCALC 102 (H) 11/21/2017   Lab Results  Component Value Date   TSH 0.506 12/08/2018   TSH 3.491 05/14/2017    Therapeutic Level Labs: No results found for: LITHIUM No results found for: VALPROATE No components found for:  CBMZ  Current Medications: Current Outpatient Medications  Medication Sig Dispense Refill  . ARIPiprazole (ABILIFY) 10 MG tablet Take 1 tablet (10 mg total) by mouth daily. 30 tablet 1  . fluvoxaMINE (LUVOX) 100 MG tablet Take 1 tablet (100 mg total) by mouth at bedtime. 30 tablet 1  . lamoTRIgine (LAMICTAL) 100 MG tablet Take 1.5 tablets (150 mg total) by mouth daily. 45 tablet 1  . Levonorgestrel (KYLEENA IU) by Intrauterine route.    . traZODone (DESYREL) 100 MG tablet Take 1.5 tablets (150 mg total) by mouth at bedtime. 45 tablet 1   No current facility-administered medications for this visit.      Musculoskeletal: Strength & Muscle Tone: unable to assess since visit was  over the telemedicine. Gait & Station: unable to assess since visit was over the telemedicine. Patient leans: N/A  Psychiatric Specialty Exam: ROSReview of 12 systems negative except as mentioned in HPI   There were no vitals taken for this visit.There is no height or weight on file to calculate BMI.  General Appearance: Casual  Eye Contact:  Good  Speech:  Clear and Coherent and Normal Rate  Volume:  Normal  Mood: "good"  Affect:  Appropriate and Full Range  Thought Process:  Goal Directed and Linear  Orientation:  Full (Time, Place, and Person)  Thought Content: Logical   Suicidal Thoughts:  No  Homicidal Thoughts:  No  Memory:  Immediate;   Fair Recent;   Fair Remote;   Fair  Judgement:  Good  Insight:  Good  Psychomotor Activity:  Normal  Concentration:  Concentration: Good and Attention Span: Good  Recall:  Good  Fund of Knowledge: Good  Language: Good  Akathisia:  No    AIMS (if indicated): not done  Assets:  Communication Skills Desire for Improvement Financial Resources/Insurance Housing Leisure Time Physical Health Social Support Transportation Vocational/Educational  ADL's:  Intact  Cognition: WNL  Sleep:  Good   Screenings: AIMS     Admission (Discharged) from 05/13/2017 in [facility] Total Score  0    AUDIT     Admission (Discharged) from 05/13/2017 in [place]  Alcohol Use Disorder Identification Test Final Score (AUDIT)  0    PHQ2-9     Office Visit from 06/19/2017 in [place] at [facility]  PHQ-2 Total Score  0  PHQ-9 Total Score  4       Assessment and Plan:   Anhelica is a 23 yr old Caucasian female who is single, employed, lives in [place], has a history of bipolar disorder, generalized anxiety disorder, insomnia evaluated today for a follow-up visit. She was a patient of Dr Einar Grad and transitioned to this Probation officer in April, 2020. Mood and anxiety improved with Luvox 100 mg  daily and Lamictal to 150 mg daily and Abilify 10 mg daily.  Reviewed response to current medication and it appears that her mood has been stable along with anxiety.  Recommending to continue medicines. Plan as below.    Plan  Bipolar 2 disorder (improving) Continue lamotrigine150 mg p.o. daily. Continue withLuvox 100 mg p.o. daily Continue Abilify at10mg  po qd.  For generalized anxiety disorder-unstable Continue CBT. Luvox as mentioned above.  Medications above will be helpful in decreasing anxiety. Previously tried Prozac upto 10 mg daily and Zoloft 150 mg daily - stopped due to inefficacy.   For insomnia-improved Continue Trazodone at150mg  p.o. nightly  Labs(03/02) CBC - Stable; CMP - Stable; TSH - WNL; Lipid panel - WNL except TG of 182; Glucose - 80 Labs ordered during the last visit, has not done them yet, says taht she will get it done next week.  Discussed that she will have to fast overnight for the blood work.   Patient advised to continue psychotherapy sessions with Ms. [name].   Follow up in 2 months or early if needed.    Follow Up Instructions:    I discussed the assessment and treatment plan with the patient. The patient was provided an opportunity to ask questions and all were answered. The patient agreed with the plan and demonstrated an understanding of the instructions.   The patient was advised to call back or seek an in-person evaluation if the symptoms worsen or if the condition fails to improve as anticipated   Orlene Erm, MD 09/16/2019, 2:22 PM

## 2019-09-22 ENCOUNTER — Encounter: Payer: Self-pay | Admitting: Licensed Clinical Social Worker

## 2019-09-22 ENCOUNTER — Other Ambulatory Visit: Payer: Self-pay

## 2019-09-22 ENCOUNTER — Ambulatory Visit (INDEPENDENT_AMBULATORY_CARE_PROVIDER_SITE_OTHER): Payer: No Typology Code available for payment source | Admitting: Licensed Clinical Social Worker

## 2019-09-22 ENCOUNTER — Telehealth: Payer: Self-pay | Admitting: Obstetrics and Gynecology

## 2019-09-22 DIAGNOSIS — F3181 Bipolar II disorder: Secondary | ICD-10-CM | POA: Diagnosis not present

## 2019-09-22 DIAGNOSIS — F411 Generalized anxiety disorder: Secondary | ICD-10-CM | POA: Diagnosis not present

## 2019-09-22 NOTE — Progress Notes (Signed)
Virtual Visit via Video Note  I connected with HENESSEY VANBUREN on 09/22/19 at 11:00 AM EST by a video enabled telemedicine application and verified that I am speaking with the correct person using two identifiers.   I discussed the limitations of evaluation and management by telemedicine and the availability of in person appointments. The patient expressed understanding and agreed to proceed. I discussed the assessment and treatment plan with the patient. The patient was provided an opportunity to ask questions and all were answered. The patient agreed with the plan and demonstrated an understanding of the instructions.   The patient was advised to call back or seek an in-person evaluation if the symptoms worsen or if the condition fails to improve as anticipated.  I provided 30 minutes of non-face-to-face time during this encounter.   [name], LCSW    THERAPIST PROGRESS NOTE  Session Time: 1100  Participation Level: Active  Behavioral Response: NeatAlertAnxious  Type of Therapy: Individual Therapy  Treatment Goals addressed: Coping  Interventions: Supportive  Summary: EMBERLYN OUK is a 23 y.o. female who presents with continued symptoms related to her diagnosis. Remmie reports doing well since our last session. She reports her mental health symptoms have been manageable, and she has felt positive about upcoming changes. She reports she is working 40 hours/week now in preparation for moving to [place]. "I need money so I have to work!" Tavonna reports some anxiety in the mornings prior to going into work, but is able to manage the anxiety, "it goes away once I'm at work." [employer]' experience, and encouraged her to utilize CBT skills we've discussed in the past to manage those anxious thoughts prior to work. Orlena expressed understanding and agreement. Almena reported otherwise, she is doing very well. She reports she has been spending a lot of time with her friends  recently, knowing she will not be able to see them once she moves. She reports feeling sad about this, but is still excited about her move. LCSW validated these feelings and encouraged Basil to communicate her feelings to her friends as to process them in the moment. Shetara expressed agreement and understanding.  Suicidal/Homicidal: No  Therapist Response: Saryiah continues to work towards her tx goals but has not yet reached them. We will continue to work on improving emotional regulation and distress tolerance skills.   Plan: Return again in 4 weeks.  Diagnosis: Axis I: Bipolar 2 Disorder    Axis II: No diagnosis    [name], LCSW 09/22/2019

## 2019-09-22 NOTE — Telephone Encounter (Signed)
Left message on voicemail to call and reschedule cancelled appointment. °

## 2019-10-08 ENCOUNTER — Encounter

## 2019-10-12 ENCOUNTER — Ambulatory Visit (INDEPENDENT_AMBULATORY_CARE_PROVIDER_SITE_OTHER): Payer: No Typology Code available for payment source

## 2019-10-12 ENCOUNTER — Other Ambulatory Visit: Payer: Self-pay

## 2019-10-12 VITALS — BP 110/70 | HR 80 | Temp 97.9°F | Ht 65.0 in | Wt 189.2 lb

## 2019-10-12 DIAGNOSIS — Z23 Encounter for immunization: Secondary | ICD-10-CM | POA: Diagnosis not present

## 2019-10-12 NOTE — Progress Notes (Signed)
Patient in today for 3rd and final Gardasil injection.   Contraception: Kyleena IUD LMP: 10/07/2019 Last AEX: 12/08/2018 with BS.  Injection given in right deltoid. Patient tolerated shot well.    Routed to provider for final review.  Encounter closed.

## 2019-10-19 ENCOUNTER — Ambulatory Visit (INDEPENDENT_AMBULATORY_CARE_PROVIDER_SITE_OTHER): Payer: No Typology Code available for payment source | Admitting: Licensed Clinical Social Worker

## 2019-10-19 ENCOUNTER — Other Ambulatory Visit: Payer: Self-pay

## 2019-10-19 ENCOUNTER — Encounter: Payer: Self-pay | Admitting: Licensed Clinical Social Worker

## 2019-10-19 DIAGNOSIS — F411 Generalized anxiety disorder: Secondary | ICD-10-CM

## 2019-10-19 DIAGNOSIS — F3181 Bipolar II disorder: Secondary | ICD-10-CM | POA: Diagnosis not present

## 2019-10-19 NOTE — Progress Notes (Signed)
  Virtual Visit via Telephone Note  I connected with Victoria Simmons on 10/19/19 at 12:30 PM EST by telephone and verified that I am speaking with the correct person using two identifiers.   I discussed the limitations, risks, security and privacy concerns of performing an evaluation and management service by telephone and the availability of in person appointments. I also discussed with the patient that there may be a patient responsible charge related to this service. The patient expressed understanding and agreed to proceed.  I discussed the assessment and treatment plan with the patient. The patient was provided an opportunity to ask questions and all were answered. The patient agreed with the plan and demonstrated an understanding of the instructions.   The patient was advised to call back or seek an in-person evaluation if the symptoms worsen or if the condition fails to improve as anticipated.  I provided 33 minutes of non-face-to-face time during this encounter.   [name], LCSW   THERAPIST PROGRESS NOTE  Session Time: 1230  Participation Level: Active  Behavioral Response: NeatAlertAnxious  Type of Therapy: Individual Therapy  Treatment Goals addressed: Coping  Interventions: Supportive  Summary: Victoria Simmons is a 24 y.o. female who presents with continued symptoms related to her diagnosis. Victoria Simmons reports doing well since our last session. She reports she is still planning to move to [place] on November 10, 2019, and has been attempting to plan and get organized for that move. LCSW validated Cressida' feelings, and highlighted how anxiety producing preparing for a move can be. Victoria Simmons expressed agreement, but noted she is doing everything she can to manage her anxiety regarding this situation. LCSW validated Quintana' attempts at coping with her anxiety, and encouraged her to recognize some anxiety is to be expected with this situation. Victoria Simmons expressed understanding and  agreement. We discussed ways Victoria Simmons could challenge thoughts in order to reduce her anxiety associated with the move. Victoria Simmons reported another thing she is finding stressful is knowing she has to get her driver's license when she goes to [place], which is something she's been putting off. Victoria Simmons reports she is able to drive, but often gets a lot of anxiety during the act. LCSW encouraged Victoria Simmons to implement distraction techniques and mindfulness activities in order to stay safe/calm in the moment. Victoria Simmons expressed understanding and agreement with all information presented during today's visit.   Suicidal/Homicidal: No   Therapist Response: Victoria Simmons continues to work towards her tx goals but has not yet reached them. We will continue to work on improving emotional regulation skills and distress tolerance moving forward.   Plan: Return again in 3 weeks.  Diagnosis: Axis I: Bipolar 2 Disorder    Axis II: No diagnosis    [name], LCSW 10/19/2019

## 2019-10-26 MED FILL — ARIPiprazole 10 MG TABS: 10 | 30 days supply | Qty: 30 | Fill #1

## 2019-10-26 MED FILL — lamoTRIgine 100 MG TABS: 100 | 30 days supply | Qty: 45 | Fill #1

## 2019-10-26 MED FILL — FLUVOXAMINE MALEATE 100 MG: 100 | 30 days supply | Qty: 30 | Fill #1

## 2019-10-26 MED FILL — traZODone HCL 100 MG TABS: 100 | 30 days supply | Qty: 45 | Fill #1

## 2019-11-06 ENCOUNTER — Ambulatory Visit (INDEPENDENT_AMBULATORY_CARE_PROVIDER_SITE_OTHER): Payer: No Typology Code available for payment source | Admitting: Child and Adolescent Psychiatry

## 2019-11-06 ENCOUNTER — Encounter: Payer: Self-pay | Admitting: Child and Adolescent Psychiatry

## 2019-11-06 ENCOUNTER — Other Ambulatory Visit: Payer: Self-pay

## 2019-11-06 DIAGNOSIS — F411 Generalized anxiety disorder: Secondary | ICD-10-CM | POA: Diagnosis not present

## 2019-11-06 DIAGNOSIS — F3181 Bipolar II disorder: Secondary | ICD-10-CM | POA: Diagnosis not present

## 2019-11-06 MED ORDER — TRAZODONE HCL 100 MG PO TABS: 150.0000 mg | ORAL_TABLET | Freq: Every day | ORAL | 1 refills | Status: DC

## 2019-11-06 MED ORDER — ARIPIPRAZOLE 10 MG PO TABS: 10.0000 mg | ORAL_TABLET | Freq: Every day | ORAL | 1 refills | Status: DC

## 2019-11-06 MED ORDER — FLUVOXAMINE MALEATE 100 MG PO TABS: 100.0000 mg | ORAL_TABLET | Freq: Every day | ORAL | 1 refills | Status: DC

## 2019-11-06 MED ORDER — LAMOTRIGINE 100 MG PO TABS: 200.0000 mg | ORAL_TABLET | Freq: Every day | ORAL | 1 refills | Status: DC
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
## 2019-11-09 ENCOUNTER — Ambulatory Visit (INDEPENDENT_AMBULATORY_CARE_PROVIDER_SITE_OTHER): Payer: No Typology Code available for payment source | Admitting: Licensed Clinical Social Worker

## 2019-11-09 ENCOUNTER — Other Ambulatory Visit: Payer: Self-pay

## 2019-11-09 ENCOUNTER — Encounter: Payer: Self-pay | Admitting: Licensed Clinical Social Worker

## 2019-11-09 DIAGNOSIS — F411 Generalized anxiety disorder: Secondary | ICD-10-CM

## 2019-11-09 DIAGNOSIS — F3181 Bipolar II disorder: Secondary | ICD-10-CM | POA: Diagnosis not present
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
## 2019-11-23 MED FILL — traZODone HCL 100 MG TABS: 100 | 30 days supply | Qty: 45 | Fill #0

## 2019-11-23 MED FILL — FLUVOXAMINE MALEATE 100 MG: 100 | 30 days supply | Qty: 30 | Fill #0

## 2019-11-23 MED FILL — ARIPIPRAZOLE 10 MG TABS: 10 | 30 days supply | Qty: 30 | Fill #0

## 2019-11-23 MED FILL — lamoTRIgine 100 MG TABS: 100 | 22 days supply | Qty: 45 | Fill #0

## 2019-12-14 ENCOUNTER — Ambulatory Visit: Payer: No Typology Code available for payment source | Admitting: Obstetrics and Gynecology

## 2019-12-16 ENCOUNTER — Ambulatory Visit: Payer: No Typology Code available for payment source | Admitting: Child and Adolescent Psychiatry

## 2019-12-16 ENCOUNTER — Other Ambulatory Visit: Payer: Self-pay

## 2019-12-17 ENCOUNTER — Telehealth: Payer: Self-pay | Admitting: Child and Adolescent Psychiatry

## 2019-12-17 NOTE — Telephone Encounter (Signed)
Pt was sent text to connect on video for telemedicine encounter for scheduled appointment, and was also followed up with phone call. Pt eventually connected almost at 5 pm on the video. We discussed to reschedul an appointment however pt reports that she has moved to [facility] and wanted to check if writer can continue to see pt and provide refills on medications. She reports that she has enough supply on medications. We discussed that Probation officer is not licensed to practice in [facility] and therefore recommend her to establish PCP and get a referral to a local psychiatrist's office. She reports that she is waiting for her insurance to come from her new employer in [place]. She reports having enough supply on medications and we discussed that if she runs out before establishing with other provider she can contact the office to get refills on her medications. She denies any other concerns. Also discussed that office can release her health records to her new psychiatry provider in [facility] with appropriate release. She verbalized understanding.

## 2019-12-22 MED FILL — lamoTRIgine 100 MG TABS: 100 | 22 days supply | Qty: 45 | Fill #1

## 2019-12-22 MED FILL — FLUVOXAMINE MALEATE 100 MG: 100 | 30 days supply | Qty: 30 | Fill #1

## 2019-12-22 MED FILL — ARIPIPRAZOLE 10 MG TABS: 10 | 30 days supply | Qty: 30 | Fill #1

## 2019-12-22 MED FILL — traZODone HCL 100 MG TABS: 100 | 30 days supply | Qty: 45 | Fill #1

## 2020-01-19 ENCOUNTER — Other Ambulatory Visit: Payer: Self-pay | Admitting: Child and Adolescent Psychiatry

## 2020-01-19 DIAGNOSIS — F3181 Bipolar II disorder: Secondary | ICD-10-CM

## 2020-01-19 MED FILL — traZODone HCL 100 MG TABS: 100 | 30 days supply | Qty: 45 | Fill #1

## 2020-01-19 MED FILL — ARIPIPRAZOLE 10 MG TABS: 10 | 30 days supply | Qty: 30 | Fill #0

## 2020-01-19 MED FILL — FLUVOXAMINE MALEATE 100 MG: 100 | 30 days supply | Qty: 30 | Fill #1

## 2020-01-25 ENCOUNTER — Telehealth: Payer: Self-pay

## 2020-01-25 NOTE — Telephone Encounter (Signed)
This pt has moved to [facility] and I do not hold a license to practice in [facility]. Can you please confirm with father where is this pt located. I have also advised pt previously to establish PCP who can refill the meds and refer to a local psychiatrist.

## 2020-01-25 NOTE — Telephone Encounter (Signed)
asked lea to contact pt to set up appt ( according to lea she had to leave a message for pt to call office back)

## 2020-01-25 NOTE — Telephone Encounter (Signed)
pt father called states that child needs refills on medication. he was told that pt needs to call office and make a virtual appt. but that a message would be sent.

## 2020-01-28 ENCOUNTER — Telehealth (INDEPENDENT_AMBULATORY_CARE_PROVIDER_SITE_OTHER): Payer: No Typology Code available for payment source | Admitting: Child and Adolescent Psychiatry

## 2020-01-28 ENCOUNTER — Encounter: Payer: Self-pay | Admitting: Child and Adolescent Psychiatry

## 2020-01-28 ENCOUNTER — Other Ambulatory Visit: Payer: Self-pay

## 2020-01-28 DIAGNOSIS — F3181 Bipolar II disorder: Secondary | ICD-10-CM

## 2020-01-28 DIAGNOSIS — F411 Generalized anxiety disorder: Secondary | ICD-10-CM | POA: Diagnosis not present

## 2020-01-28 MED ORDER — ARIPIPRAZOLE 10 MG PO TABS: 10.0000 mg | ORAL_TABLET | Freq: Every day | ORAL | 1 refills | Status: DC

## 2020-01-28 MED ORDER — LAMOTRIGINE 100 MG PO TABS: 200.0000 mg | ORAL_TABLET | Freq: Every day | ORAL | 1 refills | Status: DC

## 2020-01-28 MED ORDER — TRAZODONE HCL 100 MG PO TABS: 150.0000 mg | ORAL_TABLET | Freq: Every day | ORAL | 1 refills | Status: DC

## 2020-01-28 MED ORDER — FLUVOXAMINE MALEATE 100 MG PO TABS: 100.0000 mg | ORAL_TABLET | Freq: Every day | ORAL | 1 refills | Status: DC

## 2020-01-28 NOTE — Progress Notes (Signed)
Virtual Visit via Video Note  I connected with Victoria Simmons on 01/28/20 at  2:30 PM EDT by a video enabled telemedicine application and verified that I am speaking with the correct person using two identifiers.  Location: Patient: home Provider: Office   I discussed the limitations of evaluation and management by telemedicine and the availability of in person appointments. The patient expressed understanding and agreed to proceed.  BH MD/PA/NP OP Progress Note  01/28/2020 5:02 PM Chan KELCY ALLREAD  MRN:  [id number]  Chief Complaint: Medication management follow-up for bipolar disorder, GAD, insomnia.  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 Einar Grad since 2017 and transition to this writer in April 2020.  She is currently prescribed Lamictal 200 mg once a day, Abilify 10 mg once a day, trazodone 150 mg once a day and Luvox 100 mg once a day.  HPI: This is was seen and evaluated over telemedicine encounter for medication management follow-up per her request to refill her medications.  She reports that she has been living in [place] and is not able to locate a new provider yet because she does not have insurance in [place].  She reports that she is completing her 3 months of work in [place] and will be able to be eligible for her insurance and will be able to transfer her care in next 2 to 3 months.  Today she reports that she ran out of her Lamictal and last took on Monday evening and therefore requesting this writer to refill her medications.    Victoria Simmons reports that she has been doing well overall, denies any high highs or low lows except 1 episode about 3 weeks ago during which she felt depressed and down in terms and cut herself superficially.  She denied any suicidal intent behind her cutting at that time.  She reports that she does not want to cut herself and has not engaged in cutting herself  since then.  She denies any thoughts of suicide or self-harm.  She reports that she has been eating and sleeping well.  She reports that her anxiety has been stable.  She denies any AVH, did not admit any delusions, denies any thoughts of violence.  She reports that she has been working about 40 hours a week and spends time mostly working over hanging out with her cousin and his wife.  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  She reports that she is planning to go back to college.  We discussed that Lamictal can be restarted back at the current dose however if she is not able to fill up or start taking back before Saturday evening she should call the clinic to discuss restarting her Lamictal.  She was also recommended that if she develops any rash after restarting Lamictal she should stop the medication and go to the emergency room.  She verbalized understanding.  I discussed with her to look into [email or web address] to find providers in that area of [place].  She will best with standing.  He made a follow-up appointment in 6 weeks or earlier if needed.  Visit Diagnosis:    ICD-10-CM   1. Bipolar 2 disorder, major depressive episode (HCC)  F31.81 lamoTRIgine (LAMICTAL) 100 MG tablet    ARIPiprazole (ABILIFY) 10 MG tablet    traZODone (DESYREL) 100 MG tablet  2. GAD (generalized anxiety disorder)  F41.1 fluvoxaMINE (LUVOX) 100 MG tablet    ARIPiprazole (ABILIFY) 10 MG tablet    traZODone (DESYREL) 100 MG tablet    Past Psychiatric History: PPHx reviewed today and as followin. 1 previous inpatient admission at [facility] at the age of 64, no previous suicide attempts, has history of cutting and her past medication trials include Prozac and Zoloft.  Decreasing Luvox to 50 mg worsened her symptoms of anxiety therefore it was increased back to 100 mg once a day.   Previous trials of Zoloft 150 mg and Prozac 10 mg stopped due to inefficacy.   Past Medical History:  Past  Medical History:  Diagnosis Date  . Anxiety   . Depression   . Dysmenorrhea   . Hypertriglyceridemia 2020  . MDD (major depressive disorder) 06/19/2017  . Ovarian torsion 2008    Past Surgical History:  Procedure Laterality Date  . OVARY SURGERY  06/30/2007   dx laparoscopy, reduction of right adnexal torsion, bilateral oophoropexy of each ovary to posterior uterine fundus    Family Psychiatric History: As mentioned in initial H&P, reviewed today, no change Family History:  Family History  Problem Relation Age of Onset  . Hepatitis B Mother   . Anxiety disorder Mother   . Hypertension Father   . ADD / ADHD Brother   . Ovarian cancer Paternal Grandmother     Social History:  Social History   Socioeconomic History  . Marital status: Single    Spouse name: Not on file  . Number of children: 0  . Years of education: Not on file  . Highest education level: Some college, no degree  Occupational History  . Occupation: [employer]    Comment: full time  Tobacco Use  . Smoking status: Never Smoker  . Smokeless tobacco: Never Used  Substance and Sexual Activity  . Alcohol use: Yes    Alcohol/week: 5.0 standard drinks    Types: 5 Glasses of wine per week  . Drug use: No  . Sexual activity: Yes    Birth control/protection: I.U.D.    Comment: Victoria Simmons inserted 02/21/18---Female partner  Other Topics Concern  . Not on file  Social History Narrative   ** Merged History Encounter **       Social Determinants of Health   Financial Resource Strain:   . Difficulty of Paying Living Expenses:   Food Insecurity:   . Worried About Charity fundraiser in the Last Year:   . Arboriculturist in the Last Year:   Transportation Needs:   . Film/video editor (Medical):   Marland Kitchen Lack of Transportation (Non-Medical):   Physical Activity:   . Days of Exercise per Week:   . Minutes of Exercise per Session:   Stress:   . Feeling of Stress :   Social Connections:   . Frequency of  Communication with Friends and Family:   . Frequency of Social Gatherings with Friends and Family:   . Attends Religious Services:   . Active Member of Clubs or Organizations:   . Attends Archivist Meetings:   Marland Kitchen Marital Status:     Allergies: No Known Allergies  Metabolic Disorder Labs: Lab Results  Component Value Date   HGBA1C 4.9 05/14/2017   MPG 94 05/14/2017   No results found for: PROLACTIN Lab Results  Component Value Date   CHOL 149 12/08/2018   TRIG 182 (H) 12/08/2018   HDL 40 12/08/2018   CHOLHDL 3.7 12/08/2018   VLDL 30  05/14/2017   LDLCALC 73 12/08/2018   LDLCALC 102 (H) 11/21/2017   Lab Results  Component Value Date   TSH 0.506 12/08/2018   TSH 3.491 05/14/2017    Therapeutic Level Labs: No results found for: LITHIUM No results found for: VALPROATE No components found for:  CBMZ  Current Medications: Current Outpatient Medications  Medication Sig Dispense Refill  . ARIPiprazole (ABILIFY) 10 MG tablet Take 1 tablet (10 mg total) by mouth daily. 30 tablet 1  . fluvoxaMINE (LUVOX) 100 MG tablet Take 1 tablet (100 mg total) by mouth at bedtime. 30 tablet 1  . lamoTRIgine (LAMICTAL) 100 MG tablet Take 2 tablets (200 mg total) by mouth daily. 60 tablet 1  . Levonorgestrel (Victoria Simmons IU) by Intrauterine route.    . traZODone (DESYREL) 100 MG tablet Take 1.5 tablets (150 mg total) by mouth at bedtime. 45 tablet 1   No current facility-administered medications for this visit.     Musculoskeletal: Strength & Muscle Tone: unable to assess since visit was over the telemedicine. Gait & Station: unable to assess since visit was over the telemedicine. Patient leans: N/A  Psychiatric Specialty Exam: ROSReview of 12 systems negative except as mentioned in HPI   There were no vitals taken for this visit.There is no height or weight on file to calculate BMI.  General Appearance: Casual  Eye Contact:  Good  Speech:  Clear and Coherent and Normal Rate   Volume:  Normal  Mood: "ok"  Affect:  Appropriate, Congruent and Restricted  Thought Process:  Goal Directed and Linear  Orientation:  Full (Time, Place, and Person)  Thought Content: Logical   Suicidal Thoughts:  No  Homicidal Thoughts:  No  Memory:  Immediate;   Fair Recent;   Fair Remote;   Fair  Judgement:  Good  Insight:  Good  Psychomotor Activity:  Normal  Concentration:  Concentration: Good and Attention Span: Good  Recall:  Good  Fund of Knowledge: Good  Language: Good  Akathisia:  No    AIMS (if indicated): not done  Assets:  Communication Skills Desire for Improvement Financial Resources/Insurance Housing Leisure Time Physical Health Social Support Transportation Vocational/Educational  ADL's:  Intact  Cognition: WNL  Sleep:  Good   Screenings: AIMS     Admission (Discharged) from 05/13/2017 in [facility] Total Score  0    AUDIT     Admission (Discharged) from 05/13/2017 in [facility]  Alcohol Use Disorder Identification Test Final Score (AUDIT)  0    PHQ2-9     Office Visit from 06/19/2017 in [facility]  PHQ-2 Total Score  0  PHQ-9 Total Score  4       Assessment and Plan:   Elisiana is a 24 yr old Caucasian female who is single, employed, lives in [place] has a history of bipolar disorder, generalized anxiety disorder, insomnia evaluated today for a follow-up visit for medication refill per her request since she is not able to establish outpatient treatment in [place] due to lack of insurance.Mood and anxiety has been better with Luvox 100 mg daily, Lamictal to 200 mg daily and Abilify 10 mg daily. She does not appear depressed, manic/hypomanic or psychotic. Continues to deny any substance abuse. Reviewed response to current medication and recommneding to increase Lamictal as below. Plan as below.    Plan  Bipolar 2 disorder (improving) Continue with lamotrigine 200 mg p.o.  daily. Continue withLuvox 100 mg p.o. daily Continue  Abilify at10mg  po qd.  For generalized anxiety disorder-unstable Continue CBT. Luvox as mentioned above.  Medications above will be helpful in decreasing anxiety. Previously tried Prozac upto 10 mg daily and Zoloft 150 mg daily - stopped due to inefficacy.   For insomnia-improved Continue Trazodone at150mg  p.o. nightly  Labs(03/02) CBC - Stable; CMP - Stable; TSH - WNL; Lipid panel - WNL except TG of 182; Glucose - 80 Labs ordered during the last visit, has not done them yet  Patient has moved to [facility] and therefore not in therapy, she is recommended to find a local provider for therapy.      Follow Up Instructions:    I discussed the assessment and treatment plan with the patient. The patient was provided an opportunity to ask questions and all were answered. The patient agreed with the plan and demonstrated an understanding of the instructions.   The patient was advised to call back or seek an in-person evaluation if the symptoms worsen or if the condition fails to improve as anticipated   Orlene Erm, MD 01/28/2020, 5:02 PM

## 2020-02-19 ENCOUNTER — Other Ambulatory Visit: Payer: Self-pay | Admitting: Child and Adolescent Psychiatry

## 2020-02-19 DIAGNOSIS — F3181 Bipolar II disorder: Secondary | ICD-10-CM

## 2020-02-19 DIAGNOSIS — F411 Generalized anxiety disorder: Secondary | ICD-10-CM

## 2020-02-24 MED FILL — lamoTRIgine 100 MG TABS: 100 | 30 days supply | Qty: 45 | Fill #1

## 2020-02-29 MED FILL — ARIPIPRAZOLE 10 MG TABS: 10 | 30 days supply | Qty: 30 | Fill #0

## 2020-02-29 MED FILL — FLUVOXAMINE MALEATE 100 MG: 100 | 30 days supply | Qty: 30 | Fill #0

## 2020-02-29 MED FILL — traZODone HCL 100 MG TABS: 100 | 30 days supply | Qty: 45 | Fill #0

## 2020-03-24 ENCOUNTER — Encounter: Payer: Self-pay | Admitting: Child and Adolescent Psychiatry

## 2020-03-24 ENCOUNTER — Other Ambulatory Visit: Payer: Self-pay

## 2020-03-24 ENCOUNTER — Telehealth (INDEPENDENT_AMBULATORY_CARE_PROVIDER_SITE_OTHER): Payer: No Typology Code available for payment source | Admitting: Child and Adolescent Psychiatry

## 2020-03-24 DIAGNOSIS — F3181 Bipolar II disorder: Secondary | ICD-10-CM

## 2020-03-24 DIAGNOSIS — F411 Generalized anxiety disorder: Secondary | ICD-10-CM

## 2020-03-24 MED ORDER — TRAZODONE HCL 100 MG PO TABS: 150.0000 mg | ORAL_TABLET | Freq: Every day | ORAL | 2 refills | Status: DC

## 2020-03-24 MED ORDER — FLUVOXAMINE MALEATE 100 MG PO TABS: 100.0000 mg | ORAL_TABLET | Freq: Every day | ORAL | 2 refills | Status: DC

## 2020-03-24 MED ORDER — LAMOTRIGINE 100 MG PO TABS: 200.0000 mg | ORAL_TABLET | Freq: Every day | ORAL | 2 refills | Status: DC

## 2020-03-24 MED ORDER — ARIPIPRAZOLE 10 MG PO TABS: 10.0000 mg | ORAL_TABLET | Freq: Every day | ORAL | 2 refills | Status: DC

## 2020-03-24 MED FILL — lamoTRIgine 100 MG TABS: 100 | 30 days supply | Qty: 60 | Fill #0

## 2020-03-24 MED FILL — FLUVOXAMINE MALEATE 100 MG: 100 | 30 days supply | Qty: 30 | Fill #0

## 2020-03-24 MED FILL — ARIPIPRAZOLE 10 MG TABS: 10 | 30 days supply | Qty: 30 | Fill #0

## 2020-03-24 MED FILL — traZODone HCL 100 MG TABS: 100 | 30 days supply | Qty: 45 | Fill #0

## 2020-03-24 NOTE — Progress Notes (Signed)
Virtual Visit via Video Note  I connected with Victoria Simmons on 03/24/20 at  2:30 PM EDT by a video enabled telemedicine application and verified that I am speaking with the correct person using two identifiers.  Location: Patient: [place] Provider: Office   I discussed the limitations of evaluation and management by telemedicine and the availability of in person appointments. The patient expressed understanding and agreed to proceed.  BH MD/PA/NP OP Progress Note  03/24/2020 3:00 PM [name]  MRN:  [id number]  Chief Complaint: Medication management follow-up for bipolar disorder, GAD, insomnia.  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 Einar Grad since 2017 and transition to this writer in April 2020.  She is currently prescribed Lamictal 200 mg once a day, Abilify 10 mg once a day, trazodone 150 mg once a day and Luvox 100 mg once a day.  HPI: Patient was seen and evaluated over telemedicine encounter for medication management follow-up per her request.  She was informed previously to look for a different provider and states she moved and however she has continued to request this writer to see and refill her medications.  Today she reports that she will be moving back to [place] in September since she is missing her family and she is not able to see any doctors because she does not have any insurance in [place].  She reports that she is doing well in regards of her mood, denies any problems with depression, denies any high highs or low lows, continues to sleep well, denies any problems with eating or sleeping, denies any thoughts of suicide or self-harm, and reports that she has continued to work at [employer] in [place] with her cousin.  She reports that her anxiety has been stable except some day-to-day worries.  She reports that she had to go to the emergency room the other day  because she vomited blood.  She reports that she had an evaluation and they did not find anything and she has not had such episodes since then.  She denies using alcohol except about 2-4 drinks once a month and denies any other substance abuse.  She reports that she has been adherent to her medications and denies any problems with them.  Writer discussed that Probation officer will provide refills on her medications for the next 3 months and if she decides to stay in [place] after September then she will need to find a provider to continue get the refills on the medications.  She was recommended to make an appointment if she returns back to [place] with this Probation officer.  She verbalized understanding and agreed with the plan.  She reports that she wants the medications to be sent to [facility] outpatient pharmacy because her insurance does not pay for medications and her mother picks up the medications from the pharmacy here and since them over there.  Visit Diagnosis:    ICD-10-CM   1. Bipolar 2 disorder, major depressive episode (HCC)  F31.81 ARIPiprazole (ABILIFY) 10 MG tablet    lamoTRIgine (LAMICTAL) 100 MG tablet    traZODone (DESYREL) 100 MG tablet  2. GAD (generalized anxiety disorder)  F41.1 ARIPiprazole (ABILIFY) 10 MG tablet    fluvoxaMINE (LUVOX) 100 MG tablet    traZODone (DESYREL) 100 MG tablet    Past Psychiatric History: PPHx reviewed today and as followin. 1 previous inpatient admission at [facility] at the age of  18, no previous suicide attempts, has history of cutting and her past medication trials include Prozac and Zoloft.  Decreasing Luvox to 50 mg worsened her symptoms of anxiety therefore it was increased back to 100 mg once a day.   Previous trials of Zoloft 150 mg and Prozac 10 mg stopped due to inefficacy.   Past Medical History:  Past Medical History:  Diagnosis Date  . Anxiety   . Depression   . Dysmenorrhea   . Hypertriglyceridemia 2020  . MDD (major depressive disorder)  06/19/2017  . Ovarian torsion 2008    Past Surgical History:  Procedure Laterality Date  . OVARY SURGERY  06/30/2007   dx laparoscopy, reduction of right adnexal torsion, bilateral oophoropexy of each ovary to posterior uterine fundus    Family Psychiatric History: As mentioned in initial H&P, reviewed today, no change Family History:  Family History  Problem Relation Age of Onset  . Hepatitis B Mother   . Anxiety disorder Mother   . Hypertension Father   . ADD / ADHD Brother   . Ovarian cancer Paternal Grandmother     Social History:  Social History   Socioeconomic History  . Marital status: Single    Spouse name: Not on file  . Number of children: 0  . Years of education: Not on file  . Highest education level: Some college, no degree  Occupational History  . Occupation: [employer]    Comment: full time  Tobacco Use  . Smoking status: Never Smoker  . Smokeless tobacco: Never Used  Vaping Use  . Vaping Use: Never used  Substance and Sexual Activity  . Alcohol use: Yes    Alcohol/week: 5.0 standard drinks    Types: 5 Glasses of wine per week  . Drug use: No  . Sexual activity: Yes    Birth control/protection: I.U.D.    Comment: Kyleena inserted 02/21/18---Female partner  Other Topics Concern  . Not on file  Social History Narrative   ** Merged History Encounter **       Social Determinants of Health   Financial Resource Strain:   . Difficulty of Paying Living Expenses:   Food Insecurity:   . Worried About Charity fundraiser in the Last Year:   . Arboriculturist in the Last Year:   Transportation Needs:   . Film/video editor (Medical):   Marland Kitchen Lack of Transportation (Non-Medical):   Physical Activity:   . Days of Exercise per Week:   . Minutes of Exercise per Session:   Stress:   . Feeling of Stress :   Social Connections:   . Frequency of Communication with Friends and Family:   . Frequency of Social Gatherings with Friends and Family:   .  Attends Religious Services:   . Active Member of Clubs or Organizations:   . Attends Archivist Meetings:   Marland Kitchen Marital Status:     Allergies: No Known Allergies  Metabolic Disorder Labs: Lab Results  Component Value Date   HGBA1C 4.9 05/14/2017   MPG 94 05/14/2017   No results found for: PROLACTIN Lab Results  Component Value Date   CHOL 149 12/08/2018   TRIG 182 (H) 12/08/2018   HDL 40 12/08/2018   CHOLHDL 3.7 12/08/2018   VLDL 30 05/14/2017   LDLCALC 73 12/08/2018   LDLCALC 102 (H) 11/21/2017   Lab Results  Component Value Date   TSH 0.506 12/08/2018   TSH 3.491 05/14/2017    Therapeutic Level  Labs: No results found for: LITHIUM No results found for: VALPROATE No components found for:  CBMZ  Current Medications: Current Outpatient Medications  Medication Sig Dispense Refill  . ARIPiprazole (ABILIFY) 10 MG tablet Take 1 tablet (10 mg total) by mouth daily. 30 tablet 2  . fluvoxaMINE (LUVOX) 100 MG tablet Take 1 tablet (100 mg total) by mouth at bedtime. 30 tablet 2  . lamoTRIgine (LAMICTAL) 100 MG tablet Take 2 tablets (200 mg total) by mouth daily. 60 tablet 2  . Levonorgestrel (KYLEENA IU) by Intrauterine route.    . traZODone (DESYREL) 100 MG tablet Take 1.5 tablets (150 mg total) by mouth at bedtime. 45 tablet 2   No current facility-administered medications for this visit.     Musculoskeletal: Strength & Muscle Tone: unable to assess since visit was over the telemedicine. Gait & Station: unable to assess since visit was over the telemedicine. Patient leans: N/A  Psychiatric Specialty Exam: ROSReview of 12 systems negative except as mentioned in HPI   There were no vitals taken for this visit.There is no height or weight on file to calculate BMI.  General Appearance: Casual  Eye Contact:  Good  Speech:  Clear and Coherent and Normal Rate  Volume:  Normal  Mood: "ok"  Affect:  Appropriate, Congruent and Full Range  Thought Process:  Goal  Directed and Linear  Orientation:  Full (Time, Place, and Person)  Thought Content: Logical   Suicidal Thoughts:  No  Homicidal Thoughts:  No  Memory:  Immediate;   Fair Recent;   Fair Remote;   Fair  Judgement:  Good  Insight:  Good  Psychomotor Activity:  Normal  Concentration:  Concentration: Good and Attention Span: Good  Recall:  Good  Fund of Knowledge: Good  Language: Good  Akathisia:  No    AIMS (if indicated): not done  Assets:  Communication Skills Desire for Improvement Financial Resources/Insurance Housing Leisure Time Physical Health Social Support Transportation Vocational/Educational  ADL's:  Intact  Cognition: WNL  Sleep:  Good     Screenings: AIMS     Admission (Discharged) from 05/13/2017 in [facility] Total Score 0    AUDIT     Admission (Discharged) from 05/13/2017 in [facility]  Alcohol Use Disorder Identification Test Final Score (AUDIT) 0    PHQ2-9     Office Visit from 06/19/2017 in [facility] at [facility]  PHQ-2 Total Score 0  PHQ-9 Total Score 4       Assessment and Plan:   Breonna is a 24 yr old Caucasian female who is single, employed, lives in [place] has a history of bipolar disorder, generalized anxiety disorder, insomnia evaluated today for a follow-up visit for medication refill per her request since she is not able to establish outpatient treatment in [place] due to lack of insurance.Mood and anxiety has been stable with Luvox 100 mg daily, Lamictal to 200 mg daily and Abilify 10 mg daily. She does not appear depressed, manic/hypomanic or psychotic. Continues to deny any substance abuse. Reviewed response to current medication and recommneding medications as below. Plan as below.    Plan  Bipolar 2 disorder (improving) Continue with lamotrigine 200 mg p.o. daily. Continue withLuvox 100 mg p.o. daily Continue Abilify at10mg  po qd.  For generalized anxiety  disorder-unstable - not in therapy, Luvox as mentioned above.  Medications above will be helpful in decreasing anxiety. Previously tried Prozac upto 10 mg daily and Zoloft 150 mg daily -  stopped due to inefficacy.   For insomnia-improved Continue Trazodone at150mg  p.o. nightly  Labs(03/02) CBC - Stable; CMP - Stable; TSH - WNL; Lipid panel - WNL except TG of 182; Glucose - 80 Labs ordered during the last visit, has not done them yet  Patient has moved to [facility] and therefore not in therapy, she is recommended to find a local provider for therapy and meds and if she returns to [place] she can re-establish treatment at this clinic. We will be closing her chart at this time and can be reopened once she moved back to [place].        Follow Up Instructions:    I discussed the assessment and treatment plan with the patient. The patient was provided an opportunity to ask questions and all were answered. The patient agreed with the plan and demonstrated an understanding of the instructions.   The patient was advised to call back or seek an in-person evaluation if the symptoms worsen or if the condition fails to improve as anticipated   Orlene Erm, MD 03/24/2020, 3:00 PM

## 2020-03-30 ENCOUNTER — Telehealth: Payer: Self-pay

## 2020-03-30 NOTE — Telephone Encounter (Signed)
Patient's mother contacted the office and states that patient used to see Dr. Deborra Medina - and she is needing a new PCP. Patient's mother is asking if Dr. Darnell Level would accept her as a new patient, since the rest of her family has Dr. Darnell Level has a PCP? Please advise.

## 2020-03-30 NOTE — Telephone Encounter (Signed)
Ok to transfer care.  May place in open 30 min appt

## 2020-04-04 ENCOUNTER — Telehealth: Payer: Self-pay | Admitting: Obstetrics and Gynecology

## 2020-04-04 ENCOUNTER — Telehealth: Payer: Self-pay | Admitting: General Practice

## 2020-04-04 NOTE — Telephone Encounter (Signed)
Patient is returning call.  °

## 2020-04-04 NOTE — Telephone Encounter (Signed)
Left message for pt to return call to triage RN. 

## 2020-04-04 NOTE — Telephone Encounter (Signed)
Called patient for reminder call and covid screening and she stated she has had diarrhea for 3 weeks now but no other covid symptoms, should she be a virtual or is it ok to let her come in?

## 2020-04-04 NOTE — Telephone Encounter (Signed)
Ok to keep appointment in office if that is only symptom.

## 2020-04-04 NOTE — Telephone Encounter (Signed)
Patient is having irregular cycles with iud.

## 2020-04-04 NOTE — Telephone Encounter (Signed)
I agree with office visit.  You may close the encounter.

## 2020-04-04 NOTE — Telephone Encounter (Signed)
Spoke with pt. Pt states having irregular cycle bleeding x last 2 months with IUD. Pt states having severe abd pain/back pain with cramping with these cycles this past month. Pt rates pain on scale as 8-9. Pt states having some intermittent nausea, dizziness and lightheadedness. Pt denies SOB or a syncopal episode, denies UTI sx. Denies having BTB issues with IUD.  Pt states taking Ibuprofen 800 mg and Midol that helps a little, but not completely resolves.  Pt states not SA at this time and hasnt been since Feb 2020. Using condoms when SA.    Pt states heavy bleeding with changing a 8 hr pad every 2-3 hours and with small quarter size clots. Pt denies any problems with IUD to this point. Pt had Kyleena  IUD inserted 03/2019. Had PUS on 04/2019 for string check due to not feeling them. IUD in place per PUS. Last AEX 12/2018. Last OV for 3rd Garadsil 10/12/19. Last Hgb 12/2018= 12.9, no hx of anemia.   Bleeding pattern: 5/5-5/14- every day changing soaked pad every 2-3 hours.  5/21-5/30- 10 days 6/11- today  Pt advised to have go to ER due to pain, nausea and heavy bleeding. Pt declines,states vaginal bleeding better today with no clots and mild cramps, only changing a pad every 5- 6 hours. Pt requesting OV with Dr Quincy Simmonds before leaving for vacation on 7/2-7/23. Pt offered earlier appts as work-ins, declined.  Pt scheduled OV on 6/30 at 1 pm with Dr Quincy Simmonds. Pt aware of possible PUS and labs. Pt agreeable and verbalized understanding. Pt advised and reviewed ER precautions.  Routing to Dr Quincy Simmonds for review.

## 2020-04-05 ENCOUNTER — Other Ambulatory Visit: Payer: Self-pay

## 2020-04-05 ENCOUNTER — Encounter: Payer: Self-pay | Admitting: Family Medicine

## 2020-04-05 ENCOUNTER — Ambulatory Visit (INDEPENDENT_AMBULATORY_CARE_PROVIDER_SITE_OTHER): Payer: No Typology Code available for payment source | Admitting: Family Medicine

## 2020-04-05 VITALS — BP 112/68 | HR 84 | Temp 98.0°F | Ht 65.0 in | Wt 185.1 lb

## 2020-04-05 DIAGNOSIS — R1013 Epigastric pain: Secondary | ICD-10-CM

## 2020-04-05 DIAGNOSIS — F411 Generalized anxiety disorder: Secondary | ICD-10-CM

## 2020-04-05 DIAGNOSIS — Z79899 Other long term (current) drug therapy: Secondary | ICD-10-CM

## 2020-04-05 DIAGNOSIS — R197 Diarrhea, unspecified: Secondary | ICD-10-CM

## 2020-04-05 DIAGNOSIS — F3181 Bipolar II disorder: Secondary | ICD-10-CM | POA: Diagnosis not present

## 2020-04-05 DIAGNOSIS — E785 Hyperlipidemia, unspecified: Secondary | ICD-10-CM | POA: Diagnosis not present

## 2020-04-05 DIAGNOSIS — N921 Excessive and frequent menstruation with irregular cycle: Secondary | ICD-10-CM

## 2020-04-05 LAB — LIPID PANEL
Cholesterol: 161 mg/dL (ref 0–200)
HDL: 43.5 mg/dL (ref >39.00)
LDL Cholesterol: 103 mg/dL — ABNORMAL HIGH (ref 0–99)
NonHDL: 117.65
Total CHOL/HDL Ratio: 4
Triglycerides: 75 mg/dL (ref 0.0–149.0)
VLDL: 15 mg/dL (ref 0.0–40.0)

## 2020-04-05 LAB — CBC WITH DIFFERENTIAL/PLATELET
Basophils Absolute: 0.1 10*3/uL (ref 0.0–0.1)
Basophils Relative: 1.5 % (ref 0.0–3.0)
Eosinophils Absolute: 0.2 10*3/uL (ref 0.0–0.7)
Eosinophils Relative: 2.5 % (ref 0.0–5.0)
HCT: 40.9 % (ref 36.0–46.0)
Hemoglobin: 13.9 g/dL (ref 12.0–15.0)
Lymphocytes Relative: 34.2 % (ref 12.0–46.0)
Lymphs Abs: 2.1 10*3/uL (ref 0.7–4.0)
MCHC: 33.9 g/dL (ref 30.0–36.0)
MCV: 85.8 fl (ref 78.0–100.0)
Monocytes Absolute: 0.4 10*3/uL (ref 0.1–1.0)
Monocytes Relative: 7.1 % (ref 3.0–12.0)
Neutro Abs: 3.3 10*3/uL (ref 1.4–7.7)
Neutrophils Relative %: 54.7 % (ref 43.0–77.0)
Platelets: 192 10*3/uL (ref 150.0–400.0)
RBC: 4.76 Mil/uL (ref 3.87–5.11)
RDW: 13.2 % (ref 11.5–15.5)
WBC: 6.1 10*3/uL (ref 4.0–10.5)

## 2020-04-05 LAB — COMPREHENSIVE METABOLIC PANEL
ALT: 12 U/L (ref 0–35)
AST: 12 U/L (ref 0–37)
Albumin: 4.4 g/dL (ref 3.5–5.2)
Alkaline Phosphatase: 53 U/L (ref 39–117)
BUN: 17 mg/dL (ref 6–23)
CO2: 26 mEq/L (ref 19–32)
Calcium: 9.2 mg/dL (ref 8.4–10.5)
Chloride: 106 mEq/L (ref 96–112)
Creatinine, Ser: 0.83 mg/dL (ref 0.40–1.20)
GFR: 84.74 mL/min (ref >60.00)
Glucose, Bld: 91 mg/dL (ref 70–99)
Potassium: 4.2 mEq/L (ref 3.5–5.1)
Sodium: 140 mEq/L (ref 135–145)
Total Bilirubin: 0.2 mg/dL (ref 0.2–1.2)
Total Protein: 6.8 g/dL (ref 6.0–8.3)

## 2020-04-05 LAB — VITAMIN D 25 HYDROXY (VIT D DEFICIENCY, FRACTURES): VITD: 29.62 ng/mL — ABNORMAL LOW (ref 30.00–100.00)

## 2020-04-05 LAB — SEDIMENTATION RATE: Sed Rate: 12 mm/hr (ref 0–20)

## 2020-04-05 LAB — LIPASE: Lipase: 11 U/L (ref 11.0–59.0)

## 2020-04-05 LAB — TSH: TSH: 2.64 u[IU]/mL (ref 0.35–4.50)

## 2020-04-05 MED ORDER — OMEPRAZOLE 40 MG PO CPDR
40.0000 mg | DELAYED_RELEASE_CAPSULE | Freq: Every day | ORAL | 3 refills | Status: DC
Start: 2020-04-05 — End: 2020-07-08

## 2020-04-05 MED FILL — OMEPRAZOLE 40 MG CPDR: 40 | 30 days supply | Qty: 30 | Fill #0

## 2020-04-05 NOTE — Patient Instructions (Addendum)
Labs today  Pass by lab for stool tests as well - turn in as soon as you can.  Stop ibuprofen use while we work up GI issues.  Start omeprazole 40mg  daily.  Sign release for records from recent ER trip in [facility] ([place]).  Let me know how you're doing. We will be in touch with blood work and stool test results and may send you to GI pending results.

## 2020-04-05 NOTE — Progress Notes (Signed)
GYNECOLOGY  VISIT   HPI: 24 y.o.   Single  Caucasian  female   [id number] with No LMP recorded. (Menstrual status: IUD).   here for irregular cycles with abdomen/pelvic pain--severe at time. This does radiate to back. She states pain worse than menstrual cramps. Patient denies any urinary symptoms. She is worried about her ovary and her IUD position.  She has a hx of right ovarian torsion.   Pain is in her whole abdomen and radiates into her back.  Having her period 3 times a month, heavy and painful for 2 months.  No partner currently.  Last sexual activity was in Feb. 2020.  Cannot feel her IUD strings.   Seen yesterday by PCP for bloody diarrhea, nausea and vomiting blood for 3 weeks.  She dropped off a stool sample for further evaluation.   Visit to the ER in [place] for her symptoms.   She had a CT scan and blood work.  Told her CT was normal except for gastric inflammation.   Moving to [place] from [place], [place]. Happy to be back.  Was under a lot of stress there.   UPT: Neg Urine dip:  Trace WBCs.   GYNECOLOGIC HISTORY: No LMP recorded. (Menstrual status: IUD). Contraception:  Verdia Kuba 02-21-18 Menopausal hormone therapy:  n/a Last mammogram:  n/a Last pap smear:  12-08-18 Neg        OB History    Gravida  0   Para  0   Term  0   Preterm  0   AB  0   Living  0     SAB  0   TAB  0   Ectopic  0   Multiple  0   Live Births  0              Patient Active Problem List   Diagnosis Date Noted  . Bloody diarrhea 04/06/2020  . Epigastric abdominal pain 04/06/2020  . Well woman exam 06/19/2017  . HLD (hyperlipidemia) 06/19/2017  . Bipolar 2 disorder, major depressive episode ([place]) 05/13/2017  . GAD (generalized anxiety disorder) 05/13/2017  . Menorrhagia 01/25/2014  . Family history of hypercholesterolemia 01/06/2013  . Murmur 01/06/2013    Past Medical History:  Diagnosis Date  . Anxiety   . Depression   . Dysmenorrhea   . Hypertriglyceridemia  2020  . MDD (major depressive disorder) 06/19/2017  . Ovarian torsion 2008    Past Surgical History:  Procedure Laterality Date  . OVARY SURGERY  06/30/2007   dx laparoscopy, reduction of right adnexal torsion, bilateral oophoropexy of each ovary to posterior uterine fundus    Current Outpatient Medications  Medication Sig Dispense Refill  . ARIPiprazole (ABILIFY) 10 MG tablet Take 1 tablet (10 mg total) by mouth daily. 30 tablet 2  . fluvoxaMINE (LUVOX) 100 MG tablet Take 1 tablet (100 mg total) by mouth at bedtime. 30 tablet 2  . lamoTRIgine (LAMICTAL) 100 MG tablet Take 2 tablets (200 mg total) by mouth daily. 60 tablet 2  . Levonorgestrel (KYLEENA IU) by Intrauterine route.    Marland Kitchen omeprazole (PRILOSEC) 40 MG capsule Take 1 capsule (40 mg total) by mouth daily. For 3 weeks then as needed 30 capsule 3  . traZODone (DESYREL) 100 MG tablet Take 1.5 tablets (150 mg total) by mouth at bedtime. 45 tablet 2   No current facility-administered medications for this visit.     ALLERGIES: Patient has no known allergies.  Family History  Problem Relation Age of Onset  .  Hepatitis B Mother   . Anxiety disorder Mother   . Hypertension Father   . ADD / ADHD Brother   . Ovarian cancer Paternal Grandmother     Social History   Socioeconomic History  . Marital status: Single    Spouse name: Not on file  . Number of children: 0  . Years of education: Not on file  . Highest education level: Some college, no degree  Occupational History  . Occupation: [employer]    Comment: full time  Tobacco Use  . Smoking status: Never Smoker  . Smokeless tobacco: Never Used  Vaping Use  . Vaping Use: Never used  Substance and Sexual Activity  . Alcohol use: Yes    Alcohol/week: 5.0 standard drinks    Types: 5 Glasses of wine per week  . Drug use: No  . Sexual activity: Yes    Birth control/protection: I.U.D.    Comment: Kyleena inserted 02/21/18---Female partner  Other Topics Concern  .  Not on file  Social History Narrative   ** Merged History Encounter **       Social Determinants of Health   Financial Resource Strain:   . Difficulty of Paying Living Expenses:   Food Insecurity:   . Worried About Charity fundraiser in the Last Year:   . Arboriculturist in the Last Year:   Transportation Needs:   . Film/video editor (Medical):   Marland Kitchen Lack of Transportation (Non-Medical):   Physical Activity:   . Days of Exercise per Week:   . Minutes of Exercise per Session:   Stress:   . Feeling of Stress :   Social Connections:   . Frequency of Communication with Friends and Family:   . Frequency of Social Gatherings with Friends and Family:   . Attends Religious Services:   . Active Member of Clubs or Organizations:   . Attends Archivist Meetings:   Marland Kitchen Marital Status:   Intimate Partner Violence:   . Fear of Current or Ex-Partner:   . Emotionally Abused:   Marland Kitchen Physically Abused:   . Sexually Abused:     Review of Systems  Gastrointestinal: Positive for nausea.  Genitourinary: Positive for pelvic pain.  Musculoskeletal: Positive for back pain.  All other systems reviewed and are negative.   PHYSICAL EXAMINATION:    BP 110/70 (Cuff Size: Large)   Pulse 76   Temp 98 F (36.7 C) (Oral)   Ht 5\' 5"  (1.651 m)   Wt 186 lb 9.6 oz (84.6 kg)   BMI 31.05 kg/m     General appearance: alert, cooperative and appears stated age  Pelvic: External genitalia:  no lesions              Urethra:  normal appearing urethra with no masses, tenderness or lesions              Bartholins and Skenes: normal                 Vagina: normal appearing vagina with normal color and discharge, no lesions              Cervix: no lesions.  IUD strings noted.   No CMT.                Bimanual Exam:  Uterus:  normal size, contour, position, consistency, mobility, non-tender              Adnexa: no mass, fullness, tenderness  Chaperone was present for  exam.  ASSESSMENT  Combined abdominal and pelvic pain.  No acute abdomen.  GI bleeding. Gastritis on recent CT per patient.  Kyleena IUD. Abnormal bleeding.   Hx of ovarian torsion.  Abnormal urine dip.   PLAN  Complete work up with PCP and GI.  We talked about foods to avoid - caffeine, ETOH, spicy, carbonated, acidic. Do not take NSAIDs. Return for pelvic US to check IUD and rule out ovarian cysts.  Urine micro and culture.    An After Visit Summary was printed and given to the patient.  __32____ minutes consultation.

## 2020-04-05 NOTE — Progress Notes (Signed)
This visit was conducted in person.  BP 112/68 (BP Location: Left Arm, Patient Position: Sitting, Cuff Size: Normal)   Pulse 84   Temp 98 F (36.7 C) (Temporal)   Ht _0  (1.651 m)   Wt 185 lb 1 oz (83.9 kg)   SpO2 96%   BMI 30.80 kg/m    CC: transfer care Subjective:    Patient ID: Victoria Simmons, female    DOB: December 23, 1995, 25 y.o.   MRN: [id number]  HPI: Victoria Simmons is a 24 y.o. female presenting on 04/05/2020 for Transitions Of Care (Transfer from Dr. Deborra Medina. )   Previously saw Dr Deborra Medina.   H/o bipolar 2 and GAD followed by psychiatrist Dr Pricilla Larsson.  Currently living in [place] over the summer - works at [employer] to move back to area and work previous job at [employer].   IUD in place - [place] placed last year [facility] 02/2019.  Last saw OBGYN 10/2019 for well woman exam.  Noticing frequent vaginal bleeding (periods 3 times a month) for the past 2 months - has f/u planned with GYN.   Completed [employer] COVID vaccine 02/2020.   By the way.. seen at ER 3 wks ago in [place] for hematemesis. She states she had normal evaluation. Since then has had 3 episodes of hematemesis as well as blood in stool with clots. Ongoing bloody diarrhea for the past 3 weeks. Ongoing upper abdominal pain described as soreness with radiation to lower abdomen with bloating and swelling. + nausea and vomiting. Denies GERD symptoms. No significant alcohol use. Takes ibuprofen 813m twice daily for the past week - advised stop this. Denies recent diet changes. No bowel movement in last 5 days. Passing gas ok.  No known fmhx IBD.      Relevant past medical, surgical, family and social history reviewed and updated as indicated. Interim medical history since our last visit reviewed. Allergies and medications reviewed and updated. Outpatient Medications Prior to Visit  Medication Sig Dispense Refill  . ARIPiprazole (ABILIFY) 10 MG tablet Take 1 tablet (10 mg total) by mouth daily. 30 tablet 2  .  fluvoxaMINE (LUVOX) 100 MG tablet Take 1 tablet (100 mg total) by mouth at bedtime. 30 tablet 2  . lamoTRIgine (LAMICTAL) 100 MG tablet Take 2 tablets (200 mg total) by mouth daily. 60 tablet 2  . Levonorgestrel (KYLEENA IU) by Intrauterine route.    . traZODone (DESYREL) 100 MG tablet Take 1.5 tablets (150 mg total) by mouth at bedtime. 45 tablet 2   No facility-administered medications prior to visit.     Per HPI unless specifically indicated in ROS section below Review of Systems  Eyes: Negative for visual disturbance.  Respiratory: Negative for cough, chest tightness, shortness of breath and wheezing.   Cardiovascular: Negative for palpitations and leg swelling.  Gastrointestinal: Positive for abdominal pain, blood in stool, constipation, diarrhea, nausea and vomiting.  Genitourinary: Positive for menstrual problem. Negative for dysuria, flank pain and frequency.  Neurological: Positive for dizziness. Negative for headaches.  Psychiatric/Behavioral: Negative for dysphoric mood. The patient is nervous/anxious.    Objective:  BP 112/68 (BP Location: Left Arm, Patient Position: Sitting, Cuff Size: Normal)   Pulse 84   Temp 98 F (36.7 C) (Temporal)   Ht _1  (1.651 m)   Wt 185 lb 1 oz (83.9 kg)   SpO2 96%   BMI 30.80 kg/m   Wt Readings from Last 3 Encounters:  04/05/20 185 lb 1 oz (83.9  kg)  10/12/19 189 lb 3.2 oz (85.8 kg)  06/08/19 177 lb 6.4 oz (80.5 kg)      Physical Exam Vitals and nursing note reviewed.  Constitutional:      Appearance: Normal appearance. She is not ill-appearing.  HENT:     Mouth/Throat:     Mouth: Mucous membranes are moist.     Pharynx: Oropharynx is clear. No oropharyngeal exudate or posterior oropharyngeal erythema.  Eyes:     Extraocular Movements: Extraocular movements intact.     Conjunctiva/sclera: Conjunctivae normal.     Pupils: Pupils are equal, round, and reactive to light.  Neck:     Thyroid: No thyromegaly or thyroid tenderness.    Cardiovascular:     Rate and Rhythm: Normal rate and regular rhythm.     Pulses: Normal pulses.     Heart sounds: Normal heart sounds. No murmur heard.   Pulmonary:     Effort: Pulmonary effort is normal. No respiratory distress.     Breath sounds: Normal breath sounds. No wheezing, rhonchi or rales.  Abdominal:     General: Bowel sounds are normal. There is no distension.     Palpations: Abdomen is soft. There is no mass.     Tenderness: There is abdominal tenderness (moderate) in the right lower quadrant, epigastric area, suprapubic area and left lower quadrant. There is no right CVA tenderness, left CVA tenderness, guarding or rebound. Negative signs include Murphy's sign.     Hernia: No hernia is present.  Musculoskeletal:     Right lower leg: No edema.     Left lower leg: No edema.  Skin:    Findings: No rash.  Neurological:     Mental Status: She is alert.  Psychiatric:        Mood and Affect: Mood normal.        Behavior: Behavior normal.       Results for orders placed or performed in visit on 04/05/20  Lipid panel  Result Value Ref Range   Cholesterol 161 0 - 200 mg/dL   Triglycerides 75.0 0 - 149 mg/dL   HDL 43.50 >39.00 mg/dL   VLDL 15.0 0.0 - 40.0 mg/dL   LDL Cholesterol 103 (H) 0 - 99 mg/dL   Total CHOL/HDL Ratio 4    NonHDL 117.65   Comprehensive metabolic panel  Result Value Ref Range   Sodium 140 135 - 145 mEq/L   Potassium 4.2 3.5 - 5.1 mEq/L   Chloride 106 96 - 112 mEq/L   CO2 26 19 - 32 mEq/L   Glucose, Bld 91 70 - 99 mg/dL   BUN 17 6 - 23 mg/dL   Creatinine, Ser 0.83 0.40 - 1.20 mg/dL   Total Bilirubin 0.2 0.2 - 1.2 mg/dL   Alkaline Phosphatase 53 39 - 117 U/L   AST 12 0 - 37 U/L   ALT 12 0 - 35 U/L   Total Protein 6.8 6.0 - 8.3 g/dL   Albumin 4.4 3.5 - 5.2 g/dL   GFR 84.74 >60.00 mL/min   Calcium 9.2 8.4 - 10.5 mg/dL  TSH  Result Value Ref Range   TSH 2.64 0.35 - 4.50 uIU/mL  CBC with Differential/Platelet  Result Value Ref Range   WBC  6.1 4.0 - 10.5 K/uL   RBC 4.76 3.87 - 5.11 Mil/uL   Hemoglobin 13.9 12.0 - 15.0 g/dL   HCT 40.9 36 - 46 %   MCV 85.8 78.0 - 100.0 fl   MCHC 33.9 30.0 - 36.0  g/dL   RDW 13.2 11.5 - 15.5 %   Platelets 192.0 150 - 400 K/uL   Neutrophils Relative % 54.7 43 - 77 %   Lymphocytes Relative 34.2 12 - 46 %   Monocytes Relative 7.1 3 - 12 %   Eosinophils Relative 2.5 0 - 5 %   Basophils Relative 1.5 0 - 3 %   Neutro Abs 3.3 1.4 - 7.7 K/uL   Lymphs Abs 2.1 0.7 - 4.0 K/uL   Monocytes Absolute 0.4 0 - 1 K/uL   Eosinophils Absolute 0.2 0 - 0 K/uL   Basophils Absolute 0.1 0 - 0 K/uL  VITAMIN D 25 Hydroxy (Vit-D Deficiency, Fractures)  Result Value Ref Range   VITD 29.62 (L) 30.00 - 100.00 ng/mL  Sedimentation rate  Result Value Ref Range   Sed Rate 12 0 - 20 mm/hr  Lipase  Result Value Ref Range   Lipase 11.0 11 - 59 U/L   Depression screen [facility] 2/9 04/05/2020 06/19/2017  Decreased Interest 0 0  Down, Depressed, Hopeless 1 0  PHQ - 2 Score 1 0  Altered sleeping 2 1  Tired, decreased energy 1 1  Change in appetite 2 1  Feeling bad or failure about yourself  0 0  Trouble concentrating 0 1  Moving slowly or fidgety/restless 1 0  Suicidal thoughts 0 0  PHQ-9 Score 7 4  Difficult doing work/chores - Somewhat difficult   GAD 7 : Generalized Anxiety Score 04/05/2020  Nervous, Anxious, on Edge 1  Control/stop worrying 2  Worry too much - different things 1  Trouble relaxing 0  Restless 1  Easily annoyed or irritable 2  Afraid - awful might happen 0  Total GAD 7 Score 7   Assessment & Plan:  This visit occurred during the SARS-CoV-2 public health emergency.  Safety protocols were in place, including screening questions prior to the visit, additional usage of staff PPE, and extensive cleaning of exam room while observing appropriate contact time as indicated for disinfecting solutions.   Problem List Items Addressed This Visit    Menorrhagia    Recurrent menorrhagia with IUD in place - has  f/u planned with GYN      HLD (hyperlipidemia)    Update fasting lipid panel.      Relevant Orders   Lipid panel (Completed)   Comprehensive metabolic panel (Completed)   GAD (generalized anxiety disorder)    Stable period on current regimen managed through psychiatry.       Epigastric abdominal pain    New - see below.       Relevant Orders   Gastrointestinal Pathogen Panel PCR   CALPROTECTIN   Bloody diarrhea - Primary    3 wk h/o bloody diarrhea associated with epigastric pain and ?hematemesis in setting of recent ER visit in [place] - I have requested records. She had been regularly taking 862m ibuprofen - advised stop this. Overall well appearing with stable vital signs - deemed ok for outpatient workup. Check labs today including CBC, CMP, lipase, ESR and TSH. Will check for infectious diarrhea with GI pathogen stool PCR as well as for IBD with fecal calprotectin. If unrevealing results, low threshold to refer to GI for further evaluation.       Relevant Orders   Sedimentation rate (Completed)   Lipase (Completed)   Gastrointestinal Pathogen Panel PCR   CALPROTECTIN   Bipolar 2 disorder, major depressive episode (HCC)    Stable period on current regimen managed through psychiatry.  Relevant Orders   Comprehensive metabolic panel (Completed)   TSH (Completed)   CBC with Differential/Platelet (Completed)    Other Visit Diagnoses    Other long term (current) drug therapy       Relevant Orders   VITAMIN D 25 Hydroxy (Vit-D Deficiency, Fractures) (Completed)       Meds ordered this encounter  Medications  . omeprazole (PRILOSEC) 40 MG capsule    Sig: Take 1 capsule (40 mg total) by mouth daily. For 3 weeks then as needed    Dispense:  30 capsule    Refill:  3   Orders Placed This Encounter  Procedures  . CALPROTECTIN    Standing Status:   Future    Standing Expiration Date:   04/05/2021  . Lipid panel  . Comprehensive metabolic panel  . TSH  . CBC with  Differential/Platelet  . VITAMIN D 25 Hydroxy (Vit-D Deficiency, Fractures)  . Sedimentation rate  . Lipase  . Gastrointestinal Pathogen Panel PCR    Standing Status:   Future    Standing Expiration Date:   04/05/2021    Patient Instructions  Labs today  Pass by lab for stool tests as well - turn in as soon as you can.  Stop ibuprofen use while we work up GI issues.  Start omeprazole 21m daily.  [place] [facility] [place] [facility]  Let me know how you're doing. We will be in touch with blood work and stool test results and may send you to GI pending results.    Follow up plan: Return if symptoms worsen or fail to improve.  JRia Bush MD

## 2020-04-06 ENCOUNTER — Encounter: Payer: Self-pay | Admitting: Obstetrics and Gynecology

## 2020-04-06 ENCOUNTER — Ambulatory Visit: Payer: No Typology Code available for payment source | Admitting: Obstetrics and Gynecology

## 2020-04-06 ENCOUNTER — Other Ambulatory Visit: Payer: Self-pay

## 2020-04-06 ENCOUNTER — Other Ambulatory Visit: Payer: No Typology Code available for payment source

## 2020-04-06 VITALS — BP 110/70 | HR 76 | Temp 98.0°F | Ht 65.0 in | Wt 186.6 lb

## 2020-04-06 DIAGNOSIS — Z975 Presence of (intrauterine) contraceptive device: Secondary | ICD-10-CM | POA: Diagnosis not present

## 2020-04-06 DIAGNOSIS — R8290 Unspecified abnormal findings in urine: Secondary | ICD-10-CM

## 2020-04-06 DIAGNOSIS — R1013 Epigastric pain: Secondary | ICD-10-CM

## 2020-04-06 DIAGNOSIS — R197 Diarrhea, unspecified: Secondary | ICD-10-CM | POA: Insufficient documentation

## 2020-04-06 DIAGNOSIS — R109 Unspecified abdominal pain: Secondary | ICD-10-CM

## 2020-04-06 DIAGNOSIS — R1084 Generalized abdominal pain: Secondary | ICD-10-CM | POA: Insufficient documentation

## 2020-04-06 DIAGNOSIS — N926 Irregular menstruation, unspecified: Secondary | ICD-10-CM | POA: Diagnosis not present

## 2020-04-06 DIAGNOSIS — R102 Pelvic and perineal pain: Secondary | ICD-10-CM

## 2020-04-06 LAB — POCT URINALYSIS DIPSTICK
Bilirubin, UA: NEGATIVE
Blood, UA: NEGATIVE
Glucose, UA: NEGATIVE
Ketones, UA: NEGATIVE
Nitrite, UA: NEGATIVE
Protein, UA: NEGATIVE
Urobilinogen, UA: 0.2 E.U./dL
pH, UA: 5 (ref 5.0–8.0)

## 2020-04-06 LAB — POCT URINE PREGNANCY: Preg Test, Ur: NEGATIVE

## 2020-04-06 NOTE — Assessment & Plan Note (Signed)
Recurrent menorrhagia with IUD in place - has f/u planned with GYN

## 2020-04-06 NOTE — Assessment & Plan Note (Signed)
Stable period on current regimen managed through psychiatry.

## 2020-04-06 NOTE — Assessment & Plan Note (Addendum)
3 wk h/o bloody diarrhea associated with epigastric pain and ?hematemesis in setting of recent ER visit in [place] - I have requested records. She had been regularly taking 830m ibuprofen - advised stop this. Overall well appearing with stable vital signs - deemed ok for outpatient workup. Check labs today including CBC, CMP, lipase, ESR and TSH. Will check for infectious diarrhea with GI pathogen stool PCR as well as for IBD with fecal calprotectin. If unrevealing results, low threshold to refer to GI for further evaluation.

## 2020-04-06 NOTE — Assessment & Plan Note (Addendum)
Update fasting lipid panel.

## 2020-04-06 NOTE — Assessment & Plan Note (Signed)
New- see below. 

## 2020-04-07 LAB — URINALYSIS, MICROSCOPIC ONLY
Bacteria, UA: NONE SEEN
Casts: NONE SEEN /lpf
Epithelial Cells (non renal): >10 /hpf — AB (ref 0–10)
WBC, UA: NONE SEEN /hpf (ref 0–5)

## 2020-04-08 LAB — URINE CULTURE

## 2020-04-09 ENCOUNTER — Telehealth: Payer: Self-pay | Admitting: Obstetrics and Gynecology

## 2020-04-09 MED ORDER — NITROFURANTOIN MONOHYD MACRO 100 MG PO CAPS
100.0000 mg | ORAL_CAPSULE | Freq: Two times a day (BID) | ORAL | 0 refills | Status: DC
Start: 2020-04-09 — End: 2020-05-26

## 2020-04-09 NOTE — Telephone Encounter (Signed)
After hours phone call to patient regarding her urine culture showing Enterococcus UTI.  I left a message on her cell phone that I was sending in a prescription for an antibiotic.  [facility] is listed as her preference for the pharmacy.  Macrobid 100 mg po bid x 5 days.  I stated I would also reach out to her by a My Chart message.   She can message back if she needs a different pharmacy.

## 2020-04-11 MED FILL — NITROFURANTOIN MONO-MCR 100: 100 | 5 days supply | Qty: 10 | Fill #0

## 2020-04-12 ENCOUNTER — Telehealth: Payer: Self-pay | Admitting: Radiology

## 2020-04-12 ENCOUNTER — Telehealth: Payer: Self-pay | Admitting: Obstetrics and Gynecology

## 2020-04-12 DIAGNOSIS — Z30431 Encounter for routine checking of intrauterine contraceptive device: Secondary | ICD-10-CM

## 2020-04-12 DIAGNOSIS — N926 Irregular menstruation, unspecified: Secondary | ICD-10-CM

## 2020-04-12 LAB — GASTROINTESTINAL PATHOGEN PANEL PCR

## 2020-04-12 LAB — CALPROTECTIN: Calprotectin: 640 mcg/g — ABNORMAL HIGH

## 2020-04-12 NOTE — Telephone Encounter (Signed)
Call to patient. Per DPR, OK to leave message on voicemail.   Left voicemail requesting a return call to [facility] to review benefits and schedule recommended Pelvic ultrasound with Brook A. Silva, MD, FACOG 

## 2020-04-12 NOTE — Telephone Encounter (Signed)
LVM, need patient to pick up a sterile cup for another stool sample.

## 2020-04-13 ENCOUNTER — Other Ambulatory Visit: Payer: Self-pay | Admitting: Family Medicine

## 2020-04-13 DIAGNOSIS — R197 Diarrhea, unspecified: Secondary | ICD-10-CM

## 2020-04-13 DIAGNOSIS — R1013 Epigastric pain: Secondary | ICD-10-CM

## 2020-04-14 NOTE — Telephone Encounter (Signed)
Spoke with patient regarding benefits for recommended ultrasound. Patient is aware that ultrasound is transvaginal. Patient acknowledges understanding of information presented. Patient is aware of cancellation policy. Patient is currently in [place] until 05/22/2020. Patient scheduled appointment for 05/26/2020 at 0900AM with Sant[place]. Quincy Simmonds, MD, Cherlynn June.  Patient given ER precautions.  New order needed.

## 2020-04-14 NOTE — Telephone Encounter (Signed)
[facility] for ultrasound in August.

## 2020-04-14 NOTE — Telephone Encounter (Signed)
Left message for patient to call and confirm she received call from [place] with results.

## 2020-04-14 NOTE — Telephone Encounter (Signed)
New PUS order placed.  Routing to [facility] for precert.  Then please route to Dr Quincy Simmonds for update.

## 2020-04-20 NOTE — Telephone Encounter (Signed)
Close encounter--see Dr.Silva's note of 04-09-20.

## 2020-04-27 ENCOUNTER — Telehealth: Payer: Self-pay | Admitting: Family Medicine

## 2020-04-27 NOTE — Telephone Encounter (Signed)
Lvm asking pt to call back.  Need to update on sxs and ensure she calls GI to schedule appt.

## 2020-04-27 NOTE — Telephone Encounter (Signed)
plz call for f/u on bloody diarrhea and vomiting. It's been 3 weeks and I don't see where she's been scheduled yet (has not returned call to GI to schedule) - please encourage she call GI, does she have #?

## 2020-04-28 NOTE — Telephone Encounter (Signed)
Lvm asking pt to call back.  Need to update on sxs and ensure she calls GI to schedule appt.

## 2020-04-29 NOTE — Telephone Encounter (Signed)
Lvm asking pt to call back.  Need to update on sxs and ensure she calls GI to schedule appt.   Mailing a letter.

## 2020-05-17 NOTE — Telephone Encounter (Addendum)
Received notice that GI cancelled referral as they were unable to reach patient and she never returned their calls. Please call - it is very important she follow up with GI to further evaluate inflammatory bowel disease - may need different medicine to stop diarrhea.  If we can't reach her, please call her emergency contact without giving any information but ask them to have pt reach out to [name].

## 2020-05-17 NOTE — Telephone Encounter (Signed)
Called pt, she answered.  As I started into the reason for the call, we were disconnected.  I called back, pt answered.  Again I tried to explain the reason for the call, then call was disconnected.  Left message on vm per dpr on vm relaying Dr. Synthia Innocent message.  Asked pt to call back to let us know if she plans to follow up with GI.

## 2020-05-18 ENCOUNTER — Encounter: Payer: Self-pay | Admitting: Family Medicine

## 2020-05-18 DIAGNOSIS — R197 Diarrhea, unspecified: Secondary | ICD-10-CM

## 2020-05-18 NOTE — Telephone Encounter (Signed)
Called [facility] last night and again this morning. Straight to voicemail, left message.

## 2020-05-18 NOTE — Telephone Encounter (Signed)
Spoke with pt's mom, Dawn (on dpr), relaying Dr. Synthia Innocent message.  States pt just moved back into the area this weekend from [facility].  Says she saw the letter and mentioned to the pt to send Dr. Darnell Level a MyChart message.  But she will speak with pt and have her contact our office.  FYI to Dr. Darnell Level.

## 2020-05-18 NOTE — Telephone Encounter (Signed)
Called patient again, no answer

## 2020-05-19 MED FILL — FLUVOXAMINE MALEATE 100 MG: 100 | 30 days supply | Qty: 30 | Fill #1

## 2020-05-26 ENCOUNTER — Ambulatory Visit (INDEPENDENT_AMBULATORY_CARE_PROVIDER_SITE_OTHER): Payer: No Typology Code available for payment source

## 2020-05-26 ENCOUNTER — Encounter: Payer: Self-pay | Admitting: Obstetrics and Gynecology

## 2020-05-26 ENCOUNTER — Ambulatory Visit (INDEPENDENT_AMBULATORY_CARE_PROVIDER_SITE_OTHER): Payer: No Typology Code available for payment source | Admitting: Obstetrics and Gynecology

## 2020-05-26 ENCOUNTER — Other Ambulatory Visit: Payer: Self-pay

## 2020-05-26 VITALS — BP 108/60 | HR 80 | Ht 65.0 in | Wt 189.9 lb

## 2020-05-26 DIAGNOSIS — N926 Irregular menstruation, unspecified: Secondary | ICD-10-CM | POA: Diagnosis not present

## 2020-05-26 DIAGNOSIS — Z30431 Encounter for routine checking of intrauterine contraceptive device: Secondary | ICD-10-CM | POA: Diagnosis not present

## 2020-05-26 DIAGNOSIS — R109 Unspecified abdominal pain: Secondary | ICD-10-CM

## 2020-05-26 DIAGNOSIS — R102 Pelvic and perineal pain: Secondary | ICD-10-CM

## 2020-05-26 NOTE — Progress Notes (Signed)
GYNECOLOGY  VISIT   HPI: 24 y.o.   Single  Caucasian  female   [id number] with Patient's last menstrual period was 05/02/2020 (exact date).   here for pelvic ultrasound.   [name] was painful today.   Seen for abdominal and pelvic pain on 04/06/20. She was having frequent menses and has a [place] IUD.   She has a hx of an ovarian torsion.  She has had bloody diarrhea and is receiving care for this. She saw her PCP and then her GI and receiving care for this.  She has a follow up appointment with GI.  She had a colonoscopy and endoscopy in the past for persistent vomiting.   Having light menstrual spotting now. Her bleeding is improved since her last visit.  Generally she is happy with Verdia Kuba.   Not sexually since her STD testing in March 2020.   Has been vaccinated against Covid.   GYNECOLOGIC HISTORY: Patient's last menstrual period was 05/02/2020 (exact date). Contraception: Kyleena IUD 02-21-18. Menopausal hormone therapy:  n/a Last mammogram:  n/a Last pap smear: 12-08-18 Neg        OB History    Gravida  0   Para  0   Term  0   Preterm  0   AB  0   Living  0     SAB  0   TAB  0   Ectopic  0   Multiple  0   Live Births  0              Patient Active Problem List   Diagnosis Date Noted  . Bloody diarrhea 04/06/2020  . Epigastric abdominal pain 04/06/2020  . Well woman exam 06/19/2017  . HLD (hyperlipidemia) 06/19/2017  . Bipolar 2 disorder, major depressive episode ([place]) 05/13/2017  . GAD (generalized anxiety disorder) 05/13/2017  . Menorrhagia 01/25/2014  . Family history of hypercholesterolemia 01/06/2013  . Murmur 01/06/2013    Past Medical History:  Diagnosis Date  . Anxiety   . Depression   . Dysmenorrhea   . Hypertriglyceridemia 2020  . MDD (major depressive disorder) 06/19/2017  . Ovarian torsion 2008    Past Surgical History:  Procedure Laterality Date  . OVARY SURGERY  06/30/2007   dx laparoscopy, reduction of right adnexal  torsion, bilateral oophoropexy of each ovary to posterior uterine fundus    Current Outpatient Medications  Medication Sig Dispense Refill  . ARIPiprazole (ABILIFY) 10 MG tablet Take 1 tablet (10 mg total) by mouth daily. 30 tablet 2  . fluvoxaMINE (LUVOX) 100 MG tablet Take 1 tablet (100 mg total) by mouth at bedtime. 30 tablet 2  . lamoTRIgine (LAMICTAL) 100 MG tablet Take 2 tablets (200 mg total) by mouth daily. 60 tablet 2  . Levonorgestrel (KYLEENA IU) by Intrauterine route.    Marland Kitchen omeprazole (PRILOSEC) 40 MG capsule Take 1 capsule (40 mg total) by mouth daily. For 3 weeks then as needed 30 capsule 3  . traZODone (DESYREL) 100 MG tablet Take 1.5 tablets (150 mg total) by mouth at bedtime. 45 tablet 2   No current facility-administered medications for this visit.     ALLERGIES: Patient has no known allergies.  Family History  Problem Relation Age of Onset  . Hepatitis B Mother   . Anxiety disorder Mother   . Hypertension Father   . ADD / ADHD Brother   . Ovarian cancer Paternal Grandmother     Social History   Socioeconomic History  . Marital status: Single  Spouse name: Not on file  . Number of children: 0  . Years of education: Not on file  . Highest education level: Some college, no degree  Occupational History  . Occupation: [employer]    Comment: full time  Tobacco Use  . Smoking status: Never Smoker  . Smokeless tobacco: Never Used  Vaping Use  . Vaping Use: Never used  Substance and Sexual Activity  . Alcohol use: Yes    Alcohol/week: 5.0 standard drinks    Types: 5 Glasses of wine per week  . Drug use: No  . Sexual activity: Yes    Birth control/protection: I.U.D.    Comment: Kyleena inserted 02/21/18---Female partner  Other Topics Concern  . Not on file  Social History Narrative   ** Merged History Encounter **       Social Determinants of Health   Financial Resource Strain:   . Difficulty of Paying Living Expenses: Not on file  Food  Insecurity:   . Worried About Charity fundraiser in the Last Year: Not on file  . Ran Out of Food in the Last Year: Not on file  Transportation Needs:   . Lack of Transportation (Medical): Not on file  . Lack of Transportation (Non-Medical): Not on file  Physical Activity:   . Days of Exercise per Week: Not on file  . Minutes of Exercise per Session: Not on file  Stress:   . Feeling of Stress : Not on file  Social Connections:   . Frequency of Communication with Friends and Family: Not on file  . Frequency of Social Gatherings with Friends and Family: Not on file  . Attends Religious Services: Not on file  . Active Member of Clubs or Organizations: Not on file  . Attends Archivist Meetings: Not on file  . Marital Status: Not on file  Intimate Partner Violence:   . Fear of Current or Ex-Partner: Not on file  . Emotionally Abused: Not on file  . Physically Abused: Not on file  . Sexually Abused: Not on file    Review of Systems  All other systems reviewed and are negative.   PHYSICAL EXAMINATION:    BP 108/60 (Cuff Size: Large)   Pulse 80   Ht 5\' 5"  (1.651 m)   Wt 189 lb 14.4 oz (86.1 kg)   LMP 05/02/2020 (Exact Date)   BMI 31.60 kg/m     General appearance: alert, cooperative and appears stated age  Pelvic US Uterus - no masses.  EMS 2.83 mm.  IUD in endometrial canal. Ovaries normal.  Normal perfusion bilaterally.  No adnexal masses.  No free fluid.  ASSESSMENT  Abdominal/pelvic pain.  IUD check up.  Kyleena IUD in normal position.  No evidence of ovarian cysts or torsion.   PLAN  US findings and images reviewed with patient.  Reassurance given.  She will follow up with GI. FU for annual exam and prn.

## 2020-05-30 ENCOUNTER — Ambulatory Visit: Payer: No Typology Code available for payment source | Admitting: Obstetrics and Gynecology

## 2020-05-30 ENCOUNTER — Telehealth: Payer: Self-pay

## 2020-05-30 NOTE — Telephone Encounter (Signed)
Patient left message to cancel AEX due to having covid.

## 2020-05-31 NOTE — Telephone Encounter (Signed)
Thank you for the update!

## 2020-06-03 ENCOUNTER — Other Ambulatory Visit: Payer: Self-pay

## 2020-06-03 ENCOUNTER — Other Ambulatory Visit: Payer: Self-pay | Admitting: Child and Adolescent Psychiatry

## 2020-06-03 ENCOUNTER — Telehealth (INDEPENDENT_AMBULATORY_CARE_PROVIDER_SITE_OTHER): Payer: No Typology Code available for payment source | Admitting: Child and Adolescent Psychiatry

## 2020-06-03 DIAGNOSIS — F3181 Bipolar II disorder: Secondary | ICD-10-CM

## 2020-06-03 DIAGNOSIS — F411 Generalized anxiety disorder: Secondary | ICD-10-CM

## 2020-06-03 MED ORDER — ARIPIPRAZOLE 10 MG PO TABS: 10.0000 mg | ORAL_TABLET | Freq: Every day | ORAL | 2 refills | Status: DC

## 2020-06-03 MED ORDER — FLUVOXAMINE MALEATE 100 MG PO TABS: 100.0000 mg | ORAL_TABLET | Freq: Every day | ORAL | 2 refills | Status: DC

## 2020-06-03 MED ORDER — TRAZODONE HCL 100 MG PO TABS: 150.0000 mg | ORAL_TABLET | Freq: Every day | ORAL | 2 refills | Status: DC

## 2020-06-03 MED ORDER — LAMOTRIGINE 100 MG PO TABS: 200.0000 mg | ORAL_TABLET | Freq: Every day | ORAL | 2 refills | Status: DC

## 2020-06-03 MED FILL — ARIPIPRAZOLE 10 MG TABS: 10 | 30 days supply | Qty: 30 | Fill #0

## 2020-06-03 MED FILL — traZODone HCL 100 MG TABS: 100 | 30 days supply | Qty: 45 | Fill #0

## 2020-06-03 MED FILL — lamoTRIgine 100 MG TABS: 100 | 30 days supply | Qty: 60 | Fill #0

## 2020-06-03 NOTE — Progress Notes (Signed)
Virtual Visit via Video Note  I connected with Victoria Simmons on 06/03/20 at  9:00 AM EDT by a video enabled telemedicine application and verified that I am speaking with the correct person using two identifiers.  Location: Patient: [place] Provider: Office   I discussed the limitations of evaluation and management by telemedicine and the availability of in person appointments. The patient expressed understanding and agreed to proceed.  BH MD/PA/NP OP Progress Note  06/03/2020 9:24 AM Victoria Simmons  MRN:  [id number]  Chief Complaint: Medication management follow-up for bipolar disorder, GAD, insomnia.  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 Einar Grad since 2017 and transition to this writer in April 2020.  She is currently prescribed Lamictal 200 mg once a day, Abilify 10 mg once a day, trazodone 150 mg once a day and Luvox 100 mg once a day.  HPI: Patient was seen and evaluated over telemedicine encounter for medication management follow-up.  Victoria Simmons reports that she is back and [place] since last 2 weeks from [place] where she was living for the past 6 months.  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  She reports that she has been much happier since she has moved back.  She reports that she is currently living with her parents and planning to move to apartment towards the end of this year.  She reports that she has been spending time hanging out with her family and friends when she is not working.  She reports that she was missing her work and enjoys being back at her work.   She reports that her mood has been stable, denies any low lows or high highs, reports anxiety has been minimal.  She reports that she has been sleeping well, eating well, denies any suicidal thoughts or self-harm thoughts.  She denies any  substance abuse.  In the interim since last appointment she also had bloody stools and vomiting.  She reports that this happened about a month ago and has stopped since then.  She reports that she has followed up with PCP and is referred to GI and has an appointment in October.  She would like to continue her current psychiatric medications.  Discussed to continue them.  Writer also discussed for a referral to a new therapist since her last therapist a [place] [facility] has left.  She verbalized understanding and her call was transferred to the front desk to make an appointment for therapy.  Visit Diagnosis:    ICD-10-CM   1. Bipolar 2 disorder, major depressive episode (HCC)  F31.81 lamoTRIgine (LAMICTAL) 100 MG tablet    ARIPiprazole (ABILIFY) 10 MG tablet    traZODone (DESYREL) 100 MG tablet  2. GAD (generalized anxiety disorder)  F41.1 ARIPiprazole (ABILIFY) 10 MG tablet    fluvoxaMINE (LUVOX) 100 MG tablet    traZODone (DESYREL) 100 MG tablet    Past Psychiatric History: PPHx reviewed today and as followin. 1 previous inpatient admission at [facility] at the age of 46, no previous suicide attempts, has history of cutting and her past medication trials include Prozac and Zoloft.  Decreasing Luvox to 50 mg worsened her symptoms of anxiety therefore it was increased back to 100 mg once a day.   Previous trials of Zoloft 150 mg and Prozac 10 mg stopped due to inefficacy.   Past  Medical History:  Past Medical History:  Diagnosis Date  . Anxiety   . Depression   . Dysmenorrhea   . Hypertriglyceridemia 2020  . MDD (major depressive disorder) 06/19/2017  . Ovarian torsion 2008    Past Surgical History:  Procedure Laterality Date  . OVARY SURGERY  06/30/2007   dx laparoscopy, reduction of right adnexal torsion, bilateral oophoropexy of each ovary to posterior uterine fundus    Family Psychiatric History: As mentioned in initial H&P, reviewed today, no change Family History:  Family History  Problem  Relation Age of Onset  . Hepatitis B Mother   . Anxiety disorder Mother   . Hypertension Father   . ADD / ADHD Brother   . Ovarian cancer Paternal Grandmother     Social History:  Social History   Socioeconomic History  . Marital status: Single    Spouse name: Not on file  . Number of children: 0  . Years of education: Not on file  . Highest education level: Some college, no degree  Occupational History  . Occupation: [employer]    Comment: full time  Tobacco Use  . Smoking status: Never Smoker  . Smokeless tobacco: Never Used  Vaping Use  . Vaping Use: Never used  Substance and Sexual Activity  . Alcohol use: Yes    Alcohol/week: 5.0 standard drinks    Types: 5 Glasses of wine per week  . Drug use: No  . Sexual activity: Yes    Birth control/protection: I.U.D.    Comment: Kyleena inserted 02/21/18---Female partner  Other Topics Concern  . Not on file  Social History Narrative   ** Merged History Encounter **       Social Determinants of Health   Financial Resource Strain:   . Difficulty of Paying Living Expenses: Not on file  Food Insecurity:   . Worried About Charity fundraiser in the Last Year: Not on file  . Ran Out of Food in the Last Year: Not on file  Transportation Needs:   . Lack of Transportation (Medical): Not on file  . Lack of Transportation (Non-Medical): Not on file  Physical Activity:   . Days of Exercise per Week: Not on file  . Minutes of Exercise per Session: Not on file  Stress:   . Feeling of Stress : Not on file  Social Connections:   . Frequency of Communication with Friends and Family: Not on file  . Frequency of Social Gatherings with Friends and Family: Not on file  . Attends Religious Services: Not on file  . Active Member of Clubs or Organizations: Not on file  . Attends Archivist Meetings: Not on file  . Marital Status: Not on file    Allergies: No Known Allergies  Metabolic Disorder Labs: Lab Results   Component Value Date   HGBA1C 4.9 05/14/2017   MPG 94 05/14/2017   No results found for: PROLACTIN Lab Results  Component Value Date   CHOL 161 04/05/2020   TRIG 75.0 04/05/2020   HDL 43.50 04/05/2020   CHOLHDL 4 04/05/2020   VLDL 15.0 04/05/2020   LDLCALC 103 (H) 04/05/2020   LDLCALC 73 12/08/2018   Lab Results  Component Value Date   TSH 2.64 04/05/2020   TSH 0.506 12/08/2018    Therapeutic Level Labs: No results found for: LITHIUM No results found for: VALPROATE No components found for:  CBMZ  Current Medications: Current Outpatient Medications  Medication Sig Dispense Refill  . ARIPiprazole (ABILIFY)  10 MG tablet Take 1 tablet (10 mg total) by mouth daily. 30 tablet 2  . fluvoxaMINE (LUVOX) 100 MG tablet Take 1 tablet (100 mg total) by mouth at bedtime. 30 tablet 2  . lamoTRIgine (LAMICTAL) 100 MG tablet Take 2 tablets (200 mg total) by mouth daily. 60 tablet 2  . Levonorgestrel (KYLEENA IU) by Intrauterine route.    Marland Kitchen omeprazole (PRILOSEC) 40 MG capsule Take 1 capsule (40 mg total) by mouth daily. For 3 weeks then as needed 30 capsule 3  . traZODone (DESYREL) 100 MG tablet Take 1.5 tablets (150 mg total) by mouth at bedtime. 45 tablet 2   No current facility-administered medications for this visit.     Musculoskeletal: Strength & Muscle Tone: unable to assess since visit was over the telemedicine. Gait & Station: unable to assess since visit was over the telemedicine. Patient leans: N/A  Psychiatric Specialty Exam: ROSReview of 12 systems negative except as mentioned in HPI   There were no vitals taken for this visit.There is no height or weight on file to calculate BMI.  General Appearance: Casual  Eye Contact:  Good  Speech:  Clear and Coherent and Normal Rate  Volume:  Normal  Mood: "ok"  Affect:  Appropriate, Congruent and Full Range  Thought Process:  Goal Directed and Linear  Orientation:  Full (Time, Place, and Person)  Thought Content: Logical    Suicidal Thoughts:  No  Homicidal Thoughts:  No  Memory:  Immediate;   Fair Recent;   Fair Remote;   Fair  Judgement:  Good  Insight:  Good  Psychomotor Activity:  Normal  Concentration:  Concentration: Good and Attention Span: Good  Recall:  Good  Fund of Knowledge: Good  Language: Good  Akathisia:  No    AIMS (if indicated): not done  Assets:  Communication Skills Desire for Improvement Financial Resources/Insurance Housing Leisure Time Physical Health Social Support Transportation Vocational/Educational  ADL's:  Intact  Cognition: WNL  Sleep:  Good     Screenings: AIMS     Admission (Discharged) from 05/13/2017 in [facility] Total Score 0    AUDIT     Admission (Discharged) from 05/13/2017 in [place]  Alcohol Use Disorder Identification Test Final Score (AUDIT) 0    GAD-7     Office Visit from 04/05/2020 in [place] at [facility]  Total GAD-7 Score 7    PHQ2-9     Office Visit from 04/05/2020 in [place] at [place] from 06/19/2017 in [place] at [facility]  PHQ-2 Total Score 1 0  PHQ-9 Total Score 7 4       Assessment and Plan:   Aury is a 24 yr old Caucasian female who is single, employed, now moved back to [place] from [facility] has a history of bipolar disorder, generalized anxiety disorder, insomnia evaluated today for a follow-up visit for medication management follow up.Mood and anxiety has been stable with Luvox 100 mg daily, Lamictal to 200 mg daily and Abilify 10 mg daily. She does not appear depressed, manic/hypomanic or psychotic. Continues to deny any substance abuse. Reviewed response to current medication and recommneding medications as below. Plan as below.    Plan  Bipolar 2 disorder (chronic, stable) Continue with lamotrigine 200 mg p.o. daily. Continue withLuvox 100 mg p.o. daily Continue Abilify at10mg  po qd.  For generalized anxiety  disorder-(chronic and stable) - restart ind therapy at [facility],  Luvox as mentioned above.  Medications above will be helpful in decreasing anxiety. Previously tried Prozac upto 10 mg daily and Zoloft 150 mg daily - stopped due to inefficacy.   For insomnia-chronic and stable Continue Trazodone at150mg  p.o. nightly  Labs(03/02) CBC - Stable; CMP - Stable; TSH - WNL; Lipid panel - WNL except TG of 182; Glucose - 80 Labs ordered during the last visit, has not done them yet     Follow Up Instructions:    I discussed the assessment and treatment plan with the patient. The patient was provided an opportunity to ask questions and all were answered. The patient agreed with the plan and demonstrated an understanding of the instructions.   The patient was advised to call back or seek an in-person evaluation if the symptoms worsen or if the condition fails to improve as anticipated   Orlene Erm, MD 06/03/2020, 9:24 AM

## 2020-06-07 ENCOUNTER — Telehealth: Payer: Self-pay | Admitting: Emergency Medicine

## 2020-06-07 ENCOUNTER — Other Ambulatory Visit: Payer: Self-pay

## 2020-06-07 ENCOUNTER — Ambulatory Visit (INDEPENDENT_AMBULATORY_CARE_PROVIDER_SITE_OTHER): Payer: No Typology Code available for payment source | Admitting: Licensed Clinical Social Worker

## 2020-06-07 DIAGNOSIS — F3181 Bipolar II disorder: Secondary | ICD-10-CM

## 2020-06-07 DIAGNOSIS — F411 Generalized anxiety disorder: Secondary | ICD-10-CM

## 2020-06-07 MED ORDER — IBUPROFEN 800 MG PO TABS: 800.0000 mg | ORAL_TABLET | Freq: Three times a day (TID) | ORAL | 0 refills | Status: DC

## 2020-06-07 MED ORDER — ONDANSETRON 4 MG PO TBDP
4.0000 mg | ORAL_TABLET | Freq: Three times a day (TID) | ORAL | 0 refills | Status: DC | PRN
Start: 2020-06-07 — End: 2020-07-08

## 2020-06-07 MED ORDER — BENZONATATE 200 MG PO CAPS: 200.0000 mg | ORAL_CAPSULE | Freq: Three times a day (TID) | ORAL | 0 refills | Status: AC | PRN

## 2020-06-07 MED FILL — ONDANSETRON ODT 4 MG TABLET: 4 | 7 days supply | Qty: 20 | Fill #0

## 2020-06-07 MED FILL — BENZONATATE 200 MG CAPS: 200 | 10 days supply | Qty: 28 | Fill #0

## 2020-06-07 NOTE — Progress Notes (Signed)
Virtual Visit via Video Note  I connected with Victoria Simmons on 06/07/20 at  9:00 AM EDT by a video enabled telemedicine application and verified that I am speaking with the correct person using two identifiers.  Location: Patient: home Provider: [facility]   I discussed the limitations of evaluation and management by telemedicine and the availability of in person appointments. The patient expressed understanding and agreed to proceed.   The patient was advised to call back or seek an in-person evaluation if the symptoms worsen or if the condition fails to improve as anticipated.  I provided 45 minutes of non-face-to-face time during this encounter.   [name], LCSW    THERAPIST PROGRESS NOTE  Session Time: 9:00-9:45 am  Participation Level: Active  Behavioral Response: Neat and Well GroomedAlertAnxious  Type of Therapy: Individual Therapy  Treatment Goals addressed: Anxiety and Coping  Interventions: CBT  Summary: Victoria Simmons is a 24 y.o. female who presents with continuing symptoms associated with diagnosis (bipolar, anxiety).  Pt reports that mood has been stable, sleep quality and quantity good, and appetite good. Pt is compliant with medication. No SI, HI, AVH.  Allowed pt to explore previous counseling sessions and what was helpful/not helpful. Discussed pts primary goals and expectations.  Explored specific anxieties that pt would like to work on.   Educated pt about CBT, physical activity, mindfulness, and other relaxation strategies. Pt very encouraged   Suicidal/Homicidal: No  Therapist Response: [name] reports continuing anxiety, which is reflective of intermittent/fluctuating progress.   Plan: Return again in 4 weeks. Ongoing treatment plan to include maintaining stable mood, improving anxiety symptoms.  Diagnosis: Axis I: Bipolar, Depressed and Generalized Anxiety Disorder    Axis II: No diagnosis    Rachel Bo [name], LCSW 06/07/2020

## 2020-06-07 NOTE — Telephone Encounter (Signed)
Sending rx from visit today

## 2020-06-07 NOTE — Patient Instructions (Signed)
Caring for Your Mental Health Mental health is emotional, psychological, and social well-being. Mental health is just as important as physical health. In fact, mental and physical health are connected, and you need both to be healthy. Some signs of good mental health (well-being) include:  Being able to attend to tasks at home, school, or work.  Being able to manage stress and emotions.  Practicing self-care, which may include: ? A regular exercise pattern. ? A reasonably healthy diet. ? Supportive and trusting relationships. ? The ability to relax and calm yourself (self-calm).  Having pleasurable hobbies and activities to do.  Believing that you have meaning and purpose in your life.  Recovering and adjusting after facing challenges (resilience). You can take steps to build or strengthen these mentally healthy behaviors. There are resources and support to help you with this. Why is caring for mental health important? Caring for your mental health is a big part of staying healthy. Everyone has times when feelings, thoughts, or situations feel overwhelming. Mental health means having the skills to manage what feels overwhelming. If this sense of being overwhelmed persists, however, you might need some help. If you have some of the following signs, you may need to take better care of your mental health or seek help from a health care provider or mental health professional:  Problems with energy or focus.  Changes in eating habits.  Problems sleeping, such as sleeping too much or not enough.  Emotional distress, such as anger, sadness, depression, or anxiety.  Major changes in your relationships.  Losing interest in life or activities that you used to enjoy. If you have any of these symptoms on most days for 2 weeks or longer:  Talk with a close friend or family member about how you are feeling.  Contact your health care provider to discuss your symptoms.  Consider working with a  Education officer, community. Your health care provider, family, or friends may be able to recommend a therapist. What can I do to promote emotional and mental health? Managing emotions  Learn to identify emotions and deal with them. Recognizing your emotions is the first step in learning to deal with them.  Practice ways to appropriately express feelings. Remember that you can control your feelings. They do not control you.  Practice stress management techniques, such as: ? Relaxation techniques, like breathing or muscle relaxation exercises. ? Exercise. Regular activity can lower your stress level. ? Changing what you can change and accepting what you cannot change.  Build up your resilience so that you can recover and adjust after big problems or challenges. Practice resilient behaviors and attitudes: ? Set and focus on long-term goals. ? Develop and maintain healthy, supportive relationships. ? Learn to accept change and make the best of the situation. ? Take care of yourself physically by eating a healthy diet, getting plenty of sleep, and exercising regularly. ? Develop self-awareness. Ask others to give feedback about how they see you. ? Practice mindfulness meditation to help you stay calm when dealing with daily challenges. ? Learn to respond to situations in healthy ways, rather than reacting with your emotions. ? Keep a positive attitude, and believe in yourself. Your view of yourself affects your mental health. ? Develop your listening and empathy skills. These will help you deal with difficult situations and communications.  Remember that emotions can be used as a good source of communication and are a great source of energy. Try to laugh and find humor in life.  Sleeping  Get the right amount and quality of sleep. Sleep has a big impact on physical and mental health. To improve your sleep: ? Go to bed and wake up around the same time every day. ? Limit screen time before  bedtime. This includes the use of your cell phone, TV, computer, and tablet. ? Keep your bedroom dark and cool. Activity   Exercise or do some physical activity regularly. This helps: ? Keep your body strong, especially during times of stress. ? Get rid of chemicals in your body (hormones) that build up when you are stressed. ? Build up your resilience. Eating and drinking   Eat a healthy diet that includes whole grains, vegetables, fresh fruits, and lean proteins. If you have questions about what foods are best for you, ask your health care provider.  Try not to turn to sweet, salty, or otherwise unhealthy foods when you are tired or unhappy. This can lead to unwanted weight gain and is not a healthy way to cope with emotions. Where to find more information You can find more information about how to care for your mental health from:  National Alliance on Mental Illness (NAMI): www.nami.org  National Institute of Mental Health: www.nimh.nih.gov  Centers for Disease Control and Prevention: www.cdc.gov/hrqol/wellbeing.htm Contact a health care provider if:  You lose interest in being with others or you do not want to leave the house.  You have a hard time completing your normal activities or you have less energy than normal.  You cannot stay focused or you have problems with memory.  You feel that your senses are heightened, and this makes you upset or concerned.  You feel nervous or have rapid mood changes.  You are sleeping or eating more or less than normal.  You question reality or you show odd behavior that disturbs you or others. Get help right away if:  You have thoughts about hurting yourself or others. If you ever feel like you may hurt yourself or others, or have thoughts about taking your own life, get help right away. You can go to your nearest emergency department or call:  Your local emergency services (911 in the [place]  A suicide crisis helpline, such as the  National Suicide Prevention Lifeline at 1-800-273-8255. This is open 24 hours a day. Summary  Mental health is not just the absence of mental illness. It involves understanding your emotions and behaviors, and taking steps to cope with them in a healthy way.  If you have symptoms of mental or emotional distress, get help from family, friends, a health care provider, or a mental health professional.  Practice good mental health behaviors such as stress management skills, self-calming skills, exercise, and healthy sleeping and eating. This information is not intended to replace advice given to you by your health care provider. Make sure you discuss any questions you have with your health care provider. Document Revised: 09/06/2017 Document Reviewed: 02/05/2017 Elsevier Patient Education  2020 Elsevier Inc.  

## 2020-06-07 NOTE — Telephone Encounter (Signed)
COVID test order from visit today, see downtime forms

## 2020-06-08 LAB — NOVEL CORONAVIRUS, NAA: SARS-CoV-2, NAA: NOT DETECTED

## 2020-06-24 MED FILL — FLUVOXAMINE MALEATE 100 MG: 100 | 30 days supply | Qty: 30 | Fill #2

## 2020-07-05 ENCOUNTER — Other Ambulatory Visit: Payer: Self-pay

## 2020-07-05 ENCOUNTER — Ambulatory Visit (INDEPENDENT_AMBULATORY_CARE_PROVIDER_SITE_OTHER): Payer: No Typology Code available for payment source | Admitting: Licensed Clinical Social Worker

## 2020-07-05 DIAGNOSIS — F3181 Bipolar II disorder: Secondary | ICD-10-CM | POA: Diagnosis not present

## 2020-07-05 DIAGNOSIS — F411 Generalized anxiety disorder: Secondary | ICD-10-CM | POA: Diagnosis not present

## 2020-07-05 MED FILL — ARIPIPRAZOLE 10 MG TABS: 10 | 30 days supply | Qty: 30 | Fill #1

## 2020-07-05 NOTE — Progress Notes (Signed)
Virtual Visit via Video Note  I connected with Victoria Simmons on 07/05/20 at  9:00 AM EDT by a video enabled telemedicine application and verified that I am speaking with the correct person using two identifiers.  Location: Patient: work Provider: [facility]   I discussed the limitations of evaluation and management by telemedicine and the availability of in person appointments. The patient expressed understanding and agreed to proceed.  The patient was advised to call back or seek an in-person evaluation if the symptoms worsen or if the condition fails to improve as anticipated.  I provided 43 minutes of non-face-to-face time during this encounter.   Vester Balthazor R Jessly Lebeck, LCSW    THERAPIST PROGRESS NOTE  Session Time: 9:00-9:43a  Participation Level: Active  Behavioral Response: Neat and Well GroomedAlertAnxious  Type of Therapy: Individual Therapy  Treatment Goals addressed: Anxiety and Coping  Interventions: CBT, Supportive and Reframing  Summary: Victoria Simmons is a 24 y.o. female who presents with continuing symptoms related to her diagnosis (bipolar, anxiety). Pt reports that she is compliant with medication, good quality and quantity of sleep, and is engaging socially with others.  Allowed pt to explore and express thoughts and feeling associated with recent stressors: continuing anxiety about driving, managing anxiety, concerns about GI issues.   Discussed how pt took it upon herself to go for a drive since last session--pt broke out of comfort zone and managed anxiety well. Praised pts ability to push through her anxiety and achieve one of her goals!  Pt reports that she has not driven since that incident, but it remains on her action plan.  Pt continuing to have GI issues: feeling nauseous all the time and has the urge to vomit every time she eats. Completed EAT 26 assessment with score of 16, which is less than the clinically significant score of 20.  Discussed healthy  eating behaviors and pt is going for GI testing in the next week to see if there is an underlying medical cause for symptoms.  Encouraged self care, physical activity, and life balance  Suicidal/Homicidal: No  SI, HI, or AVH reported at time of session.  Therapist Response: Aleksa continues to make good progress with overall anxiety management and behavior modification. Pt continues to make steady gains in self esteem and confidence.  Plan: Return again in 3 weeks.The ongoing treatment plan includes maintaining current levels of progress and continuing to build skills to manage mood, improve stress/anxiety management, emotion regulation, distress tolerance, and behavior modification.   Diagnosis: Axis I: Bipolar, Depressed and Generalized Anxiety Disorder    Axis II: No diagnosis   Rachel Bo Saavi Mceachron, LCSW 07/05/2020

## 2020-07-07 NOTE — Progress Notes (Addendum)
07/07/2020 Fabiola CHRISTIANA GUREVICH [id number] Aug 27, 1996   CHIEF COMPLAINT: hematemesis, bloody diarrhea   HISTORY OF PRESENT ILLNESS:  Victoria Simmons is a 24 year old female with a past medical history of anxiety, depression which she reports is well controlled. S/P laparoscopy secondary to ovarian torsion in 2008.   She presents to our office today as referred by Dr. Ria Bush for further evaluation for N/V, hematemesis, upper and lower abdominal pain, diarrhea which is sometimes bloody with a markedly elevated fecal calprotectin level of 640 on  04/06/2020. CMP and CRP were normal.    She developed upper abdominal pain, mostly epigastric pain early June or July 2021. She developed nausea and vomiting during this time as well. She initially vomited partially digested food then around mid to late July she vomited up a moderate amount of dark red blood once daily x 7 days which stopped but she continued to vomit up food shortly after eating twice daily. No dysphagia or heartburn. She began having diarrhea mixed with dark red blood once of twice daily early September, previously she was passing normal brown formed stools. She developed lower abdominal pain with more pain to the RLQ than LLQ which persists. She continues to have watery dark brown diarrhea once daily, no further bloody diarrhea for the pasts 1 to 2 weeks.  She takes Ibuprofen 800mg  one tab twice weekly for the past 4 weeks. She was previously taking Omeprazole 40mg  once daily which she stopped taking about 4 weeks ago as she ran out of this prescription. Her N/V and epigastric pain did not improve while taking Omeprazole (prescrbied by her PCP).  No antibiotics for at least 6 months. She reports losing 10lbs over the past few months. She possibly had a fever one week ago.   She was previously evaluated by Dr. Havery Moros in 2017 for what she reports was similar N/V, epigastric pain and diarrhea but her diarrhea then was not bloody. An  abd/pelvic CT 09/07/2016 showed stranding in the fast surrounding the upper rectum which was suggestive of possible proctitis otherwise the CT was normal. She underwent an EGD and colonoscopy 12.8.2017, The EGD showed a normal esophagus, stomach biopsies showed mild inactive gastritis without evidence of H. Pylori and nonspecific duodenopathy without evidence of celiac disease. The colonoscopy was normal.  No family history of IBD. LMP was 2 weeks ago. She denies chance of pregnancy.   EGD 09/14/2016: - Esophagogastric landmarks identified. - Normal stomach. Biopsied. - Erythematous duodenopathy. Biopsied. - Normal duodenal bulb and second portion of the duodenum. Biopsied.  Colonoscopy 09/14/2016: - The examined portion of the ileum was normal. - The examination was otherwise normal on direct and retroflexion views.   Past Medical History:  Diagnosis Date   Anxiety    Depression    Dysmenorrhea    Hypertriglyceridemia 2020   MDD (major depressive disorder) 06/19/2017   Ovarian torsion 2008   Past Surgical History:  Procedure Laterality Date   OVARY SURGERY  06/30/2007   dx laparoscopy, reduction of right adnexal torsion, bilateral oophoropexy of each ovary to posterior uterine fundus   Social History:  She is single. She is a Pharmacist, hospital. No alcohol or drug use.   Family History: Mother with history of hepatitis B, hypertension and anxiety. Paternal grandmother with history of ovarian cancer. Brother with ADHD.    No Known Allergies    Outpatient Encounter Medications as of 07/08/2020  Medication Sig   ARIPiprazole (ABILIFY) 10 MG tablet Take 1  tablet (10 mg total) by mouth daily.   fluvoxaMINE (LUVOX) 100 MG tablet Take 1 tablet (100 mg total) by mouth at bedtime.   ibuprofen (ADVIL) 800 MG tablet Take 1 tablet (800 mg total) by mouth 3 (three) times daily.   lamoTRIgine (LAMICTAL) 100 MG tablet Take 2 tablets (200 mg total) by mouth daily.   Levonorgestrel (KYLEENA  IU) by Intrauterine route.   omeprazole (PRILOSEC) 40 MG capsule Take 1 capsule (40 mg total) by mouth daily. For 3 weeks then as needed   ondansetron (ZOFRAN ODT) 4 MG disintegrating tablet Take 1 tablet (4 mg total) by mouth every 8 (eight) hours as needed for nausea or vomiting.   traZODone (DESYREL) 100 MG tablet Take 1.5 tablets (150 mg total) by mouth at bedtime.   No facility-administered encounter medications on file as of 07/08/2020.     REVIEW OF SYSTEMS:  Gen: See HPI.  CV: Denies chest pain, palpitations or edema. Resp: Denies cough, shortness of breath of hemoptysis.  GI: See HPI.  GU : Denies urinary burning, blood in urine, increased urinary frequency or incontinence. MS: Denies joint pain, muscles aches or weakness. Derm: Denies rash, itchiness, skin lesions or unhealing ulcers. Psych: + anxiety and depression which she reports is well controlled.  Heme: Denies bruising, bleeding. Neuro: + headaches. No dizziness or paresthesias. Endo:  Denies any problems with DM, thyroid or adrenal function.   PHYSICAL EXAM: BP 122/78 (BP Location: Right Arm, Patient Position: Sitting, Cuff Size: Normal)    Pulse 81    Ht 5\' 5"  (1.651 m)    Wt 190 lb 6 oz (86.4 kg)    LMP 06/25/2020 (Exact Date)    BMI 31.68 kg/m   General: Well developed 24 year old female in no acute distress. Head: Normocephalic and atraumatic. Eyes:  Sclerae non-icteric, conjunctive pink. Ears: Normal auditory acuity. Mouth: Dentition intact. No ulcers or lesions.  Neck: Supple, no lymphadenopathy or thyromegaly.  Lungs: Clear bilaterally to auscultation without wheezes, crackles or rhonchi. Heart: Regular rate and rhythm. No murmur, rub or gallop appreciated.  Abdomen: Soft,  non distended. Moderate amount of epigastric, LUQ, RLQ and LLQ tenderness without rebound or guarding. No masses. No hepatosplenomegaly. Normoactive bowel sounds x 4 quadrants.  Rectal: Deferred.  Musculoskeletal: Symmetrical with no  gross deformities. Skin: Warm and dry. No rash or lesions on visible extremities. Extremities: No edema. Neurological: Alert oriented x 4, no focal deficits.  Psychological:  Alert and cooperative. Normal mood and affect.  ASSESSMENT AND PLAN:  39. 24 year old female with N/V, hematemesis and upper abdominal pain (Epigastsric and LUQ) -CBC, CMP, lipase and CRP -EGD benefits and risks discussed including risk with sedation, risk of bleeding, perforation and infection  -No NSAIDs -Pantoprazole 40mg  QD -Ondansetron 4mg  ODT Q 6 hrs PRN  2. Bloody diarrhea, lower abdominal pain RLQ > LLQ, elevated fecal calprotectin level  -GI pathogen panel -CTAP with oral and IV contrast -The above lab test and CTAP to be completed prior to EGD/colonoscopy date  -Colonoscopy benefits and risks discussed including risk with sedation, risk of bleeding, perforation and infection  -Dicyclomine 10mg  one po Q 6 to 8 hrs PRN abdominal pain -Patient to call our office if symptoms worsen  -Further follow up to be determined after the above lab evaluation completed xiety/  3. Anxiety/depression    ADDENDUM: GI PATHOGEN 07/14/2020 WAS NORMAL.         CC:  Ria Bush, MD

## 2020-07-08 ENCOUNTER — Other Ambulatory Visit: Payer: Self-pay | Admitting: Nurse Practitioner

## 2020-07-08 ENCOUNTER — Other Ambulatory Visit (INDEPENDENT_AMBULATORY_CARE_PROVIDER_SITE_OTHER): Payer: No Typology Code available for payment source

## 2020-07-08 ENCOUNTER — Ambulatory Visit (INDEPENDENT_AMBULATORY_CARE_PROVIDER_SITE_OTHER): Payer: No Typology Code available for payment source | Admitting: Nurse Practitioner

## 2020-07-08 ENCOUNTER — Encounter: Payer: Self-pay | Admitting: Nurse Practitioner

## 2020-07-08 VITALS — BP 122/78 | HR 81 | Ht 65.0 in | Wt 190.4 lb

## 2020-07-08 DIAGNOSIS — R112 Nausea with vomiting, unspecified: Secondary | ICD-10-CM

## 2020-07-08 DIAGNOSIS — K920 Hematemesis: Secondary | ICD-10-CM

## 2020-07-08 DIAGNOSIS — K921 Melena: Secondary | ICD-10-CM

## 2020-07-08 DIAGNOSIS — R197 Diarrhea, unspecified: Secondary | ICD-10-CM

## 2020-07-08 DIAGNOSIS — R1010 Upper abdominal pain, unspecified: Secondary | ICD-10-CM

## 2020-07-08 DIAGNOSIS — R1030 Lower abdominal pain, unspecified: Secondary | ICD-10-CM | POA: Diagnosis not present

## 2020-07-08 LAB — CBC WITH DIFFERENTIAL/PLATELET
Basophils Absolute: 0 10*3/uL (ref 0.0–0.1)
Basophils Relative: 0.8 % (ref 0.0–3.0)
Eosinophils Absolute: 0.1 10*3/uL (ref 0.0–0.7)
Eosinophils Relative: 1.6 % (ref 0.0–5.0)
HCT: 41.9 % (ref 36.0–46.0)
Hemoglobin: 14.3 g/dL (ref 12.0–15.0)
Lymphocytes Relative: 34.5 % (ref 12.0–46.0)
Lymphs Abs: 1.8 10*3/uL (ref 0.7–4.0)
MCHC: 34.1 g/dL (ref 30.0–36.0)
MCV: 85.1 fl (ref 78.0–100.0)
Monocytes Absolute: 0.4 10*3/uL (ref 0.1–1.0)
Monocytes Relative: 8.1 % (ref 3.0–12.0)
Neutro Abs: 2.9 10*3/uL (ref 1.4–7.7)
Neutrophils Relative %: 55 % (ref 43.0–77.0)
Platelets: 179 10*3/uL (ref 150.0–400.0)
RBC: 4.92 Mil/uL (ref 3.87–5.11)
RDW: 13.4 % (ref 11.5–15.5)
WBC: 5.2 10*3/uL (ref 4.0–10.5)

## 2020-07-08 LAB — COMPREHENSIVE METABOLIC PANEL
ALT: 19 U/L (ref 0–35)
AST: 13 U/L (ref 0–37)
Albumin: 4.5 g/dL (ref 3.5–5.2)
Alkaline Phosphatase: 67 U/L (ref 39–117)
BUN: 13 mg/dL (ref 6–23)
CO2: 25 mEq/L (ref 19–32)
Calcium: 9.1 mg/dL (ref 8.4–10.5)
Chloride: 106 mEq/L (ref 96–112)
Creatinine, Ser: 0.78 mg/dL (ref 0.40–1.20)
GFR: 90.83 mL/min (ref >60.00)
Glucose, Bld: 85 mg/dL (ref 70–99)
Potassium: 3.8 mEq/L (ref 3.5–5.1)
Sodium: 139 mEq/L (ref 135–145)
Total Bilirubin: 0.5 mg/dL (ref 0.2–1.2)
Total Protein: 7.4 g/dL (ref 6.0–8.3)

## 2020-07-08 LAB — C-REACTIVE PROTEIN: CRP: <1 mg/dL (ref 0.5–20.0)

## 2020-07-08 MED ORDER — DICYCLOMINE HCL 10 MG PO CAPS: 10.0000 mg | ORAL_CAPSULE | Freq: Four times a day (QID) | ORAL | 1 refills | Status: DC | PRN

## 2020-07-08 MED ORDER — SUTAB 1479-225-188 MG PO TABS: 1.0000 | ORAL_TABLET | Freq: Once | ORAL | 0 refills | Status: DC

## 2020-07-08 MED ORDER — ONDANSETRON 4 MG PO TBDP: 4.0000 mg | ORAL_TABLET | Freq: Four times a day (QID) | ORAL | 0 refills | Status: DC | PRN

## 2020-07-08 MED ORDER — PANTOPRAZOLE SODIUM 40 MG PO TBEC: 40.0000 mg | DELAYED_RELEASE_TABLET | Freq: Every day | ORAL | 1 refills | Status: DC

## 2020-07-08 MED FILL — ONDANSETRON ODT 4 MG TABLET: 4 | 5 days supply | Qty: 20 | Fill #0

## 2020-07-08 MED FILL — DICYCLOMINE 10 MG CAPSULE: 10 | 7 days supply | Qty: 30 | Fill #0

## 2020-07-08 MED FILL — SUTAB 1479-225-188 MG TABS: 1479-225-18 | 1 days supply | Qty: 24 | Fill #0

## 2020-07-08 MED FILL — PANTOPRAZOLE SOD DR 40 MG T: 40 | 30 days supply | Qty: 30 | Fill #0

## 2020-07-08 NOTE — Patient Instructions (Addendum)
If you are age 24 or older, your body mass index should be between 23-30. Your Body mass index is 31.68 kg/m. If this is out of the aforementioned range listed, please consider follow up with your Primary Care Provider.  If you are age 71 or younger, your body mass index should be between 19-25. Your Body mass index is 31.68 kg/m. If this is out of the aformentioned range listed, please consider follow up with your Primary Care Provider.   Your provider has requested that you go to the basement level for lab work before leaving today. Press "B" on the elevator. The lab is located at the first door on the left as you exit the elevator.  We have sent the following medications to your pharmacy for you to pick up at your convenience: Pantoprazole 40 mg daily 30-60 minutes before breakfast. Dicyclomine 10 mg four times daily as needed for abdominal pain. Zofran 4 mg ODT every 6 hours as needed for nausea and vomiting.   No Ibuprofen or NSAIDs.  You have been scheduled for a CT scan of the abdomen and pelvis at [facility] ([address]---this is in the same building as [employer]).   You are scheduled on Thursday 07/14/20 at 3 pm. You should arrive 15 minutes prior to your appointment time for registration. Please follow the written instructions below on the day of your exam:  WARNING: IF YOU ARE ALLERGIC TO IODINE/X-RAY DYE, PLEASE NOTIFY RADIOLOGY IMMEDIATELY AT [phone]! YOU WILL BE GIVEN A 13 HOUR PREMEDICATION PREP.  1) Do not eat or drink anything after 11 am (4 hours prior to your test) 2) You have been given 2 bottles of oral contrast to drink. The solution may taste better if refrigerated, but do NOT add ice or any other liquid to this solution. Shake well before drinking.    Drink 1 bottle of contrast @ 1 pm (2 hours prior to your exam)  Drink 1 bottle of contrast @ 2 pm (1 hour prior to your exam)  You may take any medications as prescribed with a  small amount of water, if necessary. If you take any of the following medications: METFORMIN, GLUCOPHAGE, GLUCOVANCE, AVANDAMET, RIOMET, FORTAMET, [place] MET, JANUMET, GLUMETZA or METAGLIP, you MAY be asked to HOLD this medication 48 hours AFTER the exam.  The purpose of you drinking the oral contrast is to aid in the visualization of your intestinal tract. The contrast solution may cause some diarrhea. Depending on your individual set of symptoms, you may also receive an intravenous injection of x-ray contrast/dye. Plan on being at [facility] for 30 minutes or longer, depending on the type of exam you are having performed.  This test typically takes 30-45 minutes to complete.  If you have any questions regarding your exam or if you need to reschedule, you may call the CT department at [phone] between the hours of 8:00 am and 5:00 pm, Monday-Friday.  __________________________________________________________________  Call office if symptoms get worse.   You have been scheduled for an endoscopy and colonoscopy. Please follow the written instructions given to you at your visit today. Please pick up your prep supplies at the pharmacy within the next 1-3 days. If you use inhalers (even only as needed), please bring them with you on the day of your procedure.  Due to recent changes in healthcare laws, you may see the results of your imaging and laboratory studies on MyChart before your provider has had a chance  to review them.  We understand that in some cases there may be results that are confusing or concerning to you. Not all laboratory results come back in the same time frame and the provider may be waiting for multiple results in order to interpret others.  Please give [name] 48 hours in order for your provider to thoroughly review all the results before contacting the office for clarification of your results.

## 2020-07-09 NOTE — Progress Notes (Signed)
Agree with assessment and plan as outlined.  

## 2020-07-12 ENCOUNTER — Telehealth: Payer: Self-pay | Admitting: Nurse Practitioner

## 2020-07-12 NOTE — Telephone Encounter (Signed)
Reports painful bowel movement with blood in stool and on the tissue. Her lower abdomen has sharp pain, burning pain and aching pain. She is at work presently.

## 2020-07-12 NOTE — Telephone Encounter (Signed)
Pt Is wanting to inform the nurse she is experiencing bloody stools once again. Pt would like a call back to advise.

## 2020-07-12 NOTE — Telephone Encounter (Signed)
Was advised to call back if she saw blood in her stools. Sees the blood in her toilet paper when she wipes.

## 2020-07-13 NOTE — Telephone Encounter (Signed)
Patient is advised.  

## 2020-07-13 NOTE — Telephone Encounter (Signed)
Beth, pls contact the patient and let her know I was off work yesterday at the time of her call. She is scheduled for a CTAP tomorrow, then EGD and colonoscopy as planned at the time of her office visit. Her blood test results were normal. Please verify if she has submitted the specimen for  a GI pathogen panel. Continue Dicyclomine she can take 20mg  qid. If she is having anal pain have her use Desitin applied inside and to the external anal area tid but not to use am of her EGD/colonoscopy. She should go to the ED if she has severe abdominal pain. THX.

## 2020-07-14 ENCOUNTER — Other Ambulatory Visit: Payer: Self-pay | Admitting: Nurse Practitioner

## 2020-07-14 ENCOUNTER — Other Ambulatory Visit: Payer: Self-pay

## 2020-07-14 ENCOUNTER — Ambulatory Visit (INDEPENDENT_AMBULATORY_CARE_PROVIDER_SITE_OTHER)
Admission: RE | Admit: 2020-07-14 | Discharge: 2020-07-14 | Disposition: A | Discharge status: Home / Self Care | Payer: No Typology Code available for payment source | Source: Ambulatory Visit | Attending: Nurse Practitioner | Admitting: Nurse Practitioner

## 2020-07-14 DIAGNOSIS — K920 Hematemesis: Secondary | ICD-10-CM | POA: Diagnosis not present

## 2020-07-14 DIAGNOSIS — R1010 Upper abdominal pain, unspecified: Secondary | ICD-10-CM | POA: Diagnosis not present

## 2020-07-14 DIAGNOSIS — R1030 Lower abdominal pain, unspecified: Secondary | ICD-10-CM | POA: Diagnosis not present

## 2020-07-14 DIAGNOSIS — R112 Nausea with vomiting, unspecified: Secondary | ICD-10-CM

## 2020-07-14 DIAGNOSIS — K921 Melena: Secondary | ICD-10-CM

## 2020-07-14 DIAGNOSIS — R197 Diarrhea, unspecified: Secondary | ICD-10-CM

## 2020-07-14 MED ORDER — IOHEXOL 300 MG/ML  SOLN
100.0000 mL | Freq: Once | INTRAMUSCULAR | Status: AC | PRN
  Administered 2020-07-14: 100 mL via INTRAVENOUS

## 2020-07-19 LAB — GI PROFILE, STOOL, PCR

## 2020-07-26 ENCOUNTER — Ambulatory Visit (HOSPITAL_COMMUNITY)
Admission: EM | Admit: 2020-07-26 | Discharge: 2020-07-27 | Disposition: A | Discharge status: Other Acute Inpatient Hospital | Payer: No Typology Code available for payment source | Attending: Psychiatry | Admitting: Psychiatry

## 2020-07-26 ENCOUNTER — Ambulatory Visit (INDEPENDENT_AMBULATORY_CARE_PROVIDER_SITE_OTHER): Payer: No Typology Code available for payment source | Admitting: Licensed Clinical Social Worker

## 2020-07-26 ENCOUNTER — Other Ambulatory Visit: Payer: Self-pay

## 2020-07-26 DIAGNOSIS — Z20822 Contact with and (suspected) exposure to covid-19: Secondary | ICD-10-CM | POA: Diagnosis not present

## 2020-07-26 DIAGNOSIS — F3181 Bipolar II disorder: Secondary | ICD-10-CM

## 2020-07-26 DIAGNOSIS — F419 Anxiety disorder, unspecified: Secondary | ICD-10-CM | POA: Insufficient documentation

## 2020-07-26 LAB — POCT URINE DRUG SCREEN - MANUAL ENTRY (I-SCREEN)
POC Amphetamine UR: NOT DETECTED
POC Buprenorphine (BUP): NOT DETECTED
POC Cocaine UR: NOT DETECTED
POC Marijuana UR: POSITIVE — AB
POC Methadone UR: NOT DETECTED
POC Methamphetamine UR: NOT DETECTED
POC Morphine: NOT DETECTED
POC Oxazepam (BZO): NOT DETECTED
POC Oxycodone UR: NOT DETECTED
POC Secobarbital (BAR): NOT DETECTED

## 2020-07-26 LAB — CBC WITH DIFFERENTIAL/PLATELET
Abs Immature Granulocytes: 0.05 10*3/uL (ref 0.00–0.07)
Basophils Absolute: 0.1 10*3/uL (ref 0.0–0.1)
Basophils Relative: 1 %
Eosinophils Absolute: 0.2 10*3/uL (ref 0.0–0.5)
Eosinophils Relative: 2 %
HCT: 43 % (ref 36.0–46.0)
Hemoglobin: 13.9 g/dL (ref 12.0–15.0)
Immature Granulocytes: 1 %
Lymphocytes Relative: 24 %
Lymphs Abs: 2.3 10*3/uL (ref 0.7–4.0)
MCH: 28.1 pg (ref 26.0–34.0)
MCHC: 32.3 g/dL (ref 30.0–36.0)
MCV: 87 fL (ref 80.0–100.0)
Monocytes Absolute: 0.7 10*3/uL (ref 0.1–1.0)
Monocytes Relative: 7 %
Neutro Abs: 6.4 10*3/uL (ref 1.7–7.7)
Neutrophils Relative %: 65 %
Platelets: 245 10*3/uL (ref 150–400)
RBC: 4.94 MIL/uL (ref 3.87–5.11)
RDW: 12.6 % (ref 11.5–15.5)
WBC: 9.7 10*3/uL (ref 4.0–10.5)
nRBC: 0 % (ref 0.0–0.2)

## 2020-07-26 LAB — COMPREHENSIVE METABOLIC PANEL
ALT: 44 U/L (ref 0–44)
AST: 24 U/L (ref 15–41)
Albumin: 4.5 g/dL (ref 3.5–5.0)
Alkaline Phosphatase: 66 U/L (ref 38–126)
Anion gap: 11 (ref 5–15)
BUN: 11 mg/dL (ref 6–20)
CO2: 24 mmol/L (ref 22–32)
Calcium: 9.5 mg/dL (ref 8.9–10.3)
Chloride: 104 mmol/L (ref 98–111)
Creatinine, Ser: 0.95 mg/dL (ref 0.44–1.00)
GFR, Estimated: >60 mL/min (ref >60)
Glucose, Bld: 82 mg/dL (ref 70–99)
Potassium: 3.6 mmol/L (ref 3.5–5.1)
Sodium: 139 mmol/L (ref 135–145)
Total Bilirubin: 0.7 mg/dL (ref 0.3–1.2)
Total Protein: 7.3 g/dL (ref 6.5–8.1)

## 2020-07-26 LAB — RESPIRATORY PANEL BY RT PCR (FLU A&B, COVID)
Influenza A by PCR: NEGATIVE
Influenza B by PCR: NEGATIVE
SARS Coronavirus 2 by RT PCR: NEGATIVE

## 2020-07-26 LAB — TSH: TSH: 2.401 u[IU]/mL (ref 0.350–4.500)

## 2020-07-26 LAB — LIPID PANEL
Cholesterol: 226 mg/dL — ABNORMAL HIGH (ref 0–200)
HDL: 44 mg/dL (ref >40)
LDL Cholesterol: 154 mg/dL — ABNORMAL HIGH (ref 0–99)
Total CHOL/HDL Ratio: 5.1 RATIO
Triglycerides: 138 mg/dL (ref <150)
VLDL: 28 mg/dL (ref 0–40)

## 2020-07-26 LAB — POCT PREGNANCY, URINE: Preg Test, Ur: NEGATIVE

## 2020-07-26 LAB — ETHANOL: Alcohol, Ethyl (B): <10 mg/dL (ref <10)

## 2020-07-26 LAB — POC SARS CORONAVIRUS 2 AG: SARS Coronavirus 2 Ag: NEGATIVE

## 2020-07-26 MED ORDER — MAGNESIUM HYDROXIDE 400 MG/5ML PO SUSP: 30.0000 mL | Freq: Every day | ORAL | Status: DC | PRN

## 2020-07-26 MED ORDER — FLUVOXAMINE MALEATE 50 MG PO TABS
100.0000 mg | ORAL_TABLET | Freq: Every day | ORAL | Status: DC
  Administered 2020-07-26: 100 mg via ORAL
  Filled 2020-07-26: qty 2

## 2020-07-26 MED ORDER — LAMOTRIGINE 100 MG PO TABS
100.0000 mg | ORAL_TABLET | Freq: Every day | ORAL | Status: DC
  Administered 2020-07-26: 100 mg via ORAL
  Filled 2020-07-26: qty 1

## 2020-07-26 MED ORDER — TRAZODONE HCL 100 MG PO TABS: 100.0000 mg | ORAL_TABLET | Freq: Every day | ORAL | Status: DC

## 2020-07-26 MED ORDER — LAMOTRIGINE 100 MG PO TABS: 100.0000 mg | ORAL_TABLET | Freq: Every day | ORAL | Status: DC

## 2020-07-26 MED ORDER — ARIPIPRAZOLE 10 MG PO TABS: 10.0000 mg | ORAL_TABLET | Freq: Every day | ORAL | Status: DC

## 2020-07-26 MED ORDER — ALUM & MAG HYDROXIDE-SIMETH 200-200-20 MG/5ML PO SUSP: 30.0000 mL | ORAL | Status: DC | PRN

## 2020-07-26 MED ORDER — TRAZODONE HCL 100 MG PO TABS
100.0000 mg | ORAL_TABLET | Freq: Every day | ORAL | Status: DC
Start: 2020-07-26 — End: 2020-08-05

## 2020-07-26 MED ORDER — ARIPIPRAZOLE 10 MG PO TABS
10.0000 mg | ORAL_TABLET | Freq: Every day | ORAL | Status: DC
  Administered 2020-07-26: 10 mg via ORAL
  Filled 2020-07-26: qty 1

## 2020-07-26 MED ORDER — ACETAMINOPHEN 325 MG PO TABS: 650.0000 mg | ORAL_TABLET | Freq: Four times a day (QID) | ORAL | Status: DC | PRN

## 2020-07-26 NOTE — ED Notes (Signed)
Patient belongings in locker # [id number].

## 2020-07-26 NOTE — Progress Notes (Signed)
Virtual Visit via Video Note  I connected with GWENEVERE Simmons on 07/26/20 at  9:00 AM EDT by a video enabled telemedicine application and verified that I am speaking with the correct person using two identifiers.  Location: Patient: home Provider: [facility]   I discussed the limitations of evaluation and management by telemedicine and the availability of in person appointments. The patient expressed understanding and agreed to proceed.   The patient was advised to call back or seek an in-person evaluation if the symptoms worsen or if the condition fails to improve as anticipated.  I provided 45 minutes of non-face-to-face time during this encounter.  [place], LCSW  THERAPIST PROGRESS NOTE  Session Time: 9:00-9:38a  Participation Level: Active  Behavioral Response: Neat and Well GroomedAlertAnxious and Depressed  Type of Therapy: Individual Therapy  Treatment Goals addressed: Anxiety and Coping  Interventions: Solution Focused and Supportive  Summary: Victoria Simmons is a 24 y.o. female who presents with escalating symptoms related to bipolar II diagnosis. Current symptoms: tearfulness, low/depressed mood, anxiety, suicidal thoughts, decreased appetite, trouble sleeping.   Pt reports significant family stress and conflict within the family that has triggered anxiety/stress/depressive episode. Pt having a lot of negative thoughts about self and perception of self by others. Pt feels current depression is impacting overall functioning at work and impairing social engagement and overall life engagement.   Pt disclosed during counseling session "I just wanted to take all my meds and be done with it all".  LCSW counselor asked pt to clarify statement and pt did admit that she was having suicidal thoughts with a plan and intent to follow through.  Encouraged pt to get emergency/urgent mental health help immediately.    Pt is currently at work but verbally contracted for safety and  states that she is going to call her grandmother to take her to [facility] (behavioral health urgent care center) for assessment. Will call pt later to follow up and scheduled appt in one week. Pt has appt with Dr. Jeani Sow 10/29.   Suicidal/Homicidal: Yeswith intent/plan. Refer to [facility] walk in clinic. Pt contracted for safety and will get grandma and/or mother to drive her to [facility]. Gave pt Mobile Crisis #.   Therapist Response: Current crisis is overwhelming pts resources.  Referred for urgent behavioral health care/emergent care. Gave mobile crisis #.  Plan: Return again in 1 weeks. Reassess.  Diagnosis: Axis I: Bipolar, mixed    Axis II: No diagnosis    [place], LCSW 07/26/2020
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
## 2020-07-26 NOTE — ED Notes (Signed)
Pt gave verbal consent to give info to father
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
## 2020-07-26 NOTE — ED Notes (Signed)
Patient alert, oriented and ambulatory. Patient here for SI with plan to take pills. Patient states she has a lot of family stress and work stress. Patient provided support and encouragement. Monitoring continues.

## 2020-07-26 NOTE — ED Notes (Signed)
Safe Transport requested to [facility].

## 2020-07-26 NOTE — Progress Notes (Signed)
   Pt accepted to [facility], [address] [place] Rm/Bed 303/02     Liliane Shi, RN is the accepting provider.    Dr. Mallie Darting is the attending provider.    Call report to  [place], NP at [facility] notified.     Pt is Voluntary.    Pt may be transported by [employer]  Pt scheduled  to arrive at [facility] anytime after [place] Disposition, CSW [phone] (cell)

## 2020-07-26 NOTE — ED Provider Notes (Signed)
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 mg/dL Final   Total CHOL/HDL Ratio 04/05/2020 4   Final                  Men           Women1/2 Average Risk     3.4          3.3Average Risk          5.0          4.42X Average Risk          9.6          7.13X Average Risk          15.0          11.0                       NonHDL 04/05/2020 117.65   Final   NOTE:  Non-HDL goal should be 30 mg/dL higher than patient's LDL goal (i.e. LDL goal of < 70 mg/dL, would have non-HDL goal of < 100 mg/dL)   Sodium 04/05/2020 140  135 - 145 mEq/L Final   Potassium 04/05/2020 4.2  3.5 - 5.1 mEq/L Final   Chloride 04/05/2020 106  96 - 112 mEq/L Final   CO2 04/05/2020 26  19 - 32 mEq/L Final   Glucose, Bld 04/05/2020 91  70 - 99 mg/dL Final   BUN 04/05/2020 17  6 - 23 mg/dL Final   Creatinine, Ser 04/05/2020 0.83  0.40 - 1.20 mg/dL Final   Total Bilirubin 04/05/2020 0.2  0.2 - 1.2 mg/dL Final   Alkaline Phosphatase 04/05/2020 53  39 - 117 U/L Final   AST 04/05/2020 12  0 - 37 U/L Final   ALT 04/05/2020 12  0 - 35 U/L Final   Total Protein 04/05/2020 6.8  6.0 - 8.3 g/dL Final   Albumin 04/05/2020 4.4  3.5 - 5.2 g/dL Final   GFR 04/05/2020 84.74  >60.00 mL/min Final   Calcium 04/05/2020 9.2  8.4 - 10.5 mg/dL Final   TSH 04/05/2020 2.64  0.35 - 4.50 uIU/mL Final   WBC 04/05/2020 6.1  4.0 - 10.5 K/uL Final   RBC 04/05/2020 4.76  3.87 - 5.11 Mil/uL Final   Hemoglobin 04/05/2020 13.9  12.0 - 15.0 g/dL Final   HCT 04/05/2020 40.9  36 - 46 % Final   MCV 04/05/2020 85.8  78.0 - 100.0 fl Final   MCHC 04/05/2020 33.9  30.0 - 36.0 g/dL Final  RDW 04/05/2020 13.2  11.5 - 15.5 % Final   Platelets 04/05/2020 192.0  150 - 400 K/uL Final   Neutrophils Relative % 04/05/2020 54.7  43 - 77 % Final   Lymphocytes Relative 04/05/2020 34.2  12 - 46 % Final   Monocytes Relative 04/05/2020 7.1  3 - 12 % Final   Eosinophils Relative 04/05/2020 2.5  0 - 5 % Final   Basophils Relative 04/05/2020 1.5  0 - 3 % Final   Neutro Abs 04/05/2020 3.3  1.4 - 7.7 K/uL Final   Lymphs Abs 04/05/2020 2.1  0.7 - 4.0 K/uL Final   Monocytes  Absolute 04/05/2020 0.4  0.1 - 1.0 K/uL Final   Eosinophils Absolute 04/05/2020 0.2  0.0 - 0.7 K/uL Final   Basophils Absolute 04/05/2020 0.1  0.0 - 0.1 K/uL Final   VITD 04/05/2020 29.62* 30.00 - 100.00 ng/mL Final   Sed Rate 04/05/2020 12  0 - 20 mm/hr Final   Lipase 04/05/2020 11.0  11 - 59 U/L Final    Allergies: Patient has no known allergies.  PTA Medications: (Not in a hospital admission)   Medical Decision Making  Labs and covid ordered Meets inpatient criteria Home medications restarted    Recommendations  Based on my evaluation the patient does not appear to have an emergency medical condition.  [place], FNP 07/26/20  5:27 PM

## 2020-07-26 NOTE — ED Notes (Signed)
Pt A&O x 4, no distress noted, calm & cooperative, monitoring for safety.  Pending report & transfer to [facility] rm [id number].

## 2020-07-26 NOTE — Discharge Instructions (Addendum)
Discharge to [facility] 

## 2020-07-26 NOTE — ED Notes (Signed)
Report called to RN Mariam, [facility] rm [id number].  Comptroller.

## 2020-07-27 ENCOUNTER — Inpatient Hospital Stay (HOSPITAL_COMMUNITY)
Admission: AD | Admit: 2020-07-27 | Discharge: 2020-07-29 | DRG: 885 | Disposition: A | Discharge status: Home / Self Care | Payer: No Typology Code available for payment source | Attending: Psychiatry | Admitting: Psychiatry

## 2020-07-27 ENCOUNTER — Encounter (HOSPITAL_COMMUNITY): Payer: Self-pay | Admitting: Psychiatry

## 2020-07-27 DIAGNOSIS — E781 Pure hyperglyceridemia: Secondary | ICD-10-CM | POA: Diagnosis present

## 2020-07-27 DIAGNOSIS — F410 Panic disorder [episodic paroxysmal anxiety] without agoraphobia: Secondary | ICD-10-CM | POA: Diagnosis present

## 2020-07-27 DIAGNOSIS — F1290 Cannabis use, unspecified, uncomplicated: Secondary | ICD-10-CM | POA: Diagnosis present

## 2020-07-27 DIAGNOSIS — Z79899 Other long term (current) drug therapy: Secondary | ICD-10-CM | POA: Diagnosis not present

## 2020-07-27 DIAGNOSIS — Z23 Encounter for immunization: Secondary | ICD-10-CM | POA: Diagnosis not present

## 2020-07-27 DIAGNOSIS — F3181 Bipolar II disorder: Principal | ICD-10-CM | POA: Diagnosis present

## 2020-07-27 DIAGNOSIS — R45851 Suicidal ideations: Secondary | ICD-10-CM | POA: Diagnosis present

## 2020-07-27 DIAGNOSIS — H6690 Otitis media, unspecified, unspecified ear: Secondary | ICD-10-CM | POA: Diagnosis present

## 2020-07-27 DIAGNOSIS — Z9151 Personal history of suicidal behavior: Secondary | ICD-10-CM | POA: Diagnosis not present

## 2020-07-27 DIAGNOSIS — Z818 Family history of other mental and behavioral disorders: Secondary | ICD-10-CM | POA: Diagnosis not present

## 2020-07-27 DIAGNOSIS — K219 Gastro-esophageal reflux disease without esophagitis: Secondary | ICD-10-CM | POA: Diagnosis present

## 2020-07-27 DIAGNOSIS — F411 Generalized anxiety disorder: Secondary | ICD-10-CM | POA: Diagnosis present

## 2020-07-27 HISTORY — DX: Bipolar II disorder: F31.81

## 2020-07-27 LAB — HEMOGLOBIN A1C
Hgb A1c MFr Bld: 5 % (ref 4.8–5.6)
Mean Plasma Glucose: 97 mg/dL

## 2020-07-27 MED ORDER — MAGNESIUM HYDROXIDE 400 MG/5ML PO SUSP: 30.0000 mL | Freq: Every day | ORAL | Status: DC | PRN

## 2020-07-27 MED ORDER — TRAZODONE HCL 100 MG PO TABS
100.0000 mg | ORAL_TABLET | Freq: Every day | ORAL | Status: DC
  Administered 2020-07-27 – 2020-07-28 (×2): 100 mg via ORAL
  Filled 2020-07-27 (×5): qty 1

## 2020-07-27 MED ORDER — HYDROXYZINE HCL 25 MG PO TABS
25.0000 mg | ORAL_TABLET | Freq: Three times a day (TID) | ORAL | Status: DC | PRN
  Administered 2020-07-27: 25 mg via ORAL
  Filled 2020-07-27: qty 1

## 2020-07-27 MED ORDER — INFLUENZA VAC SPLIT QUAD 0.5 ML IM SUSY
0.5000 mL | PREFILLED_SYRINGE | INTRAMUSCULAR | Status: AC
  Administered 2020-07-28: 0.5 mL via INTRAMUSCULAR
  Filled 2020-07-27: qty 0.5

## 2020-07-27 MED ORDER — FLUVOXAMINE MALEATE 50 MG PO TABS
150.0000 mg | ORAL_TABLET | Freq: Every day | ORAL | Status: DC
  Administered 2020-07-27 – 2020-07-28 (×2): 150 mg via ORAL
  Filled 2020-07-27 (×4): qty 1

## 2020-07-27 MED ORDER — LAMOTRIGINE 100 MG PO TABS
100.0000 mg | ORAL_TABLET | Freq: Every day | ORAL | Status: DC
  Administered 2020-07-27 – 2020-07-28 (×2): 100 mg via ORAL
  Filled 2020-07-27 (×5): qty 1

## 2020-07-27 MED ORDER — ARIPIPRAZOLE 10 MG PO TABS
10.0000 mg | ORAL_TABLET | Freq: Every day | ORAL | Status: DC
  Administered 2020-07-27 – 2020-07-28 (×2): 10 mg via ORAL
  Filled 2020-07-27 (×5): qty 1

## 2020-07-27 MED ORDER — FLUVOXAMINE MALEATE 100 MG PO TABS
100.0000 mg | ORAL_TABLET | Freq: Every day | ORAL | Status: DC
  Filled 2020-07-27: qty 1

## 2020-07-27 MED ORDER — ALUM & MAG HYDROXIDE-SIMETH 200-200-20 MG/5ML PO SUSP
30.0000 mL | ORAL | Status: DC | PRN
  Administered 2020-07-27: 30 mL via ORAL
  Filled 2020-07-27: qty 30

## 2020-07-27 MED ORDER — ACETAMINOPHEN 325 MG PO TABS
650.0000 mg | ORAL_TABLET | Freq: Four times a day (QID) | ORAL | Status: DC | PRN
  Administered 2020-07-28: 650 mg via ORAL
  Filled 2020-07-27: qty 2

## 2020-07-27 NOTE — Tx Team (Signed)
Initial Treatment Plan 07/27/2020 3:06 AM Victoria Simmons KKD:[id number]    PATIENT STRESSORS: Marital or family conflict Medication change or noncompliance   PATIENT STRENGTHS: Financial means Motivation for treatment/growth   PATIENT IDENTIFIED PROBLEMS: Anxiety  Depression  "Coping skills"  "Positive thinking "               DISCHARGE CRITERIA:  Ability to meet basic life and health needs Improved stabilization in mood, thinking, and/or behavior Medical problems require only outpatient monitoring Motivation to continue treatment in a less acute level of care  PRELIMINARY DISCHARGE PLAN: Attend aftercare/continuing care group Attend PHP/IOP Outpatient therapy Return to previous living arrangement Return to previous work or school arrangements  PATIENT/FAMILY INVOLVEMENT: This treatment plan has been presented to and reviewed with the patient, Victoria Simmons, and/or family member.  The patient and family have been given the opportunity to ask questions and make suggestions.  Wolfgang Phoenix, RN 07/27/2020, 3:06 AM

## 2020-07-27 NOTE — [facility] Counselor (Signed)
Adult Comprehensive Assessment  Patient ID: LAINE FONNER, female   DOB: 1996/08/19, 24 y.o.   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 / Lack of resources (include bankruptcy): Yes, states stress with her income Housing / Lack of housing: Denies stressor Physical health (include injuries & life threatening diseases): non reported Social relationships: Yes, states she has some stress with her friends Substance abuse: Denies stressor Bereavement / Loss: Denies stressor   Living/Environment/Situation:  Living Arrangements: Parent, Other relatives (mom, dad, brother, sister) Living conditions (as described by patient or guardian): States that the environment used to be toxic, however states it is now good  How long has patient lived in current situation?: 19 years What is atmosphere in current home: Supportive  Family History:  Marital status: Single Are you sexually active?: No What is your sexual orientation?: Lesbian Has your sexual activity been affected by drugs, alcohol, medication, or emotional stress?: na Does patient have children?: No  Childhood History:  By whom was/is the patient raised?: Both parents Additional childhood history information: Close family.  Some tension between parents when they moved to [place] because no family here. Description of patient's relationship with caregiver when they were a child: Mother: it was fine.  it began getting worse during teenage years but improved.  Father: good Patient's description of current relationship with people who raised him/her: Still get  in "spats" with mom.  Good relationship with father. How were you disciplined when you got in trouble as a child/adolescent?: time out, sent to room, spanked Does patient have siblings?: Yes Number of Siblings: 2 Description of patient's current relationship with siblings: 40 year old brother, 24 year old sister.  Relationships "rocky" with siblings currently, but trying to work on it. Did patient suffer any verbal/emotional/physical/sexual abuse as a child?: No Did patient suffer from severe childhood neglect?: No Has patient ever been sexually abused/assaulted/raped as an adolescent or adult?: No Was the patient ever a victim of a crime or a disaster?: No Witnessed domestic violence?: No Has patient been effected by domestic violence as an adult?: No  Education:  Highest grade of school patient has completed: HS diploma, 3 semesters of college: [employer]. Currently a student?: Yes If yes, how has current illness impacted academic performance: It did last fall-anxiety/depression.  Failed some classes. Name of school: [employer] How long has the patient attended?: 3 semesters Learning disability?: No  Employment/Work Situation:   Employment situation: Employed Where is patient currently employed?: Day[employer] How long has patient been employed?: 3 years Patient's job has been impacted by current illness: Yes Describe how patient's job has been impacted: States she often feels overwhelmed and is currently experiencing a satffing shortage What is the longest time patient has a held a job?: Current employment  Where was the patient employed at that time?:Current job Has patient ever been in the [employer]?: No Are There Guns or Other Weapons in [place]?: No  Financial Resources:   Financial resources: Income from employment, Support from parents / caregiver  Alcohol/Substance Abuse:   What has been your use of drugs/alcohol within the last 12 months?:  Alcohol 1-2 x monthly.  Marijuana: daily, 1  bowl.  Denies other drugs. If attempted suicide, did drugs/alcohol play a role in this?: No Alcohol/Substance Abuse Treatment Hx: Denies past history Has alcohol/substance abuse ever caused legal problems?: No  Social Support System:   Patient's Community Support System: Good Describe Community Support System: friends, grandmother. "I don't always take advantage of it" Type of faith/religion: none How does patient's faith help to cope with current illness?: na  Leisure/Recreation:   Leisure and Hobbies: going out with friends, shopping, vacationing, relaxing in bed at home, movies, restaurants  Strengths/Needs:   What things does the patient do well?: "I'm good with people" In what areas does patient struggle / problems for patient: work stress  Discharge Plan:   Currently receiving community mental health services: Yes [facility]) Patient states concerns and preferences for aftercare planning are: Patient would like to continue therapy and medication management with her current providers Patient states they will know when they are safe and ready for discharge when: Yes Does patient have access to transportation?: Yes Does patient have financial barriers related to discharge medications?: No Patient description of barriers related to discharge medications: n/a Will patient be returning to same living situation after discharge?: Yes  Summary/Recommendations:   Summary and Recommendations (to be completed by the evaluator): Pt is  24 year old female with past history significant for Anxiety, Depression and Bipolar 2 presented to [facility] walk in voluntarily reporting suicidal ideations with a plan to overdose on her medications and states that she does want to die and if she had the medication she was kill her self. While here, Jatasia Dancer can benefit from crisis stabilization, medication management, therapeutic milieu, and referrals for services.

## 2020-07-27 NOTE — Plan of Care (Signed)
Nurse discussed anxiety, depression and coping mechanisms with patient.  Patient appreciated groups also.

## 2020-07-27 NOTE — [facility] Suicide Risk Assessment (Signed)
[place] INPATIENT:  Family/Significant Other Suicide Prevention Education   Refusal of Consents:  Suicide Prevention Education:  Patient Refusal for Family/Significant Other Suicide Prevention Education: The patient Victoria Simmons has refused to provide written consent for family/significant other to be provided Family/Significant Other Suicide Prevention Education during admission and/or prior to discharge.  Physician notified.   SPE completed with patient, as patient refused to consent to family contact. SPI pamphlet provided to pt and pt was encouraged to share information with support network, ask questions, and talk about any concerns relating to SPE. Patient denies access to guns/firearms and verbalized understanding of information provided. Mobile Crisis information also provided to patient.   Darletta Moll MSW, LCSW Clincal Social Worker  [facility]
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
## 2020-07-27 NOTE — Tx Team (Cosign Needed)
Interdisciplinary Treatment and Diagnostic Plan Update  07/27/2020 Time of Session: 09:15 A.M. Victoria Simmons MRN: [id number]  Principal Diagnosis: <principal problem not specified>  Secondary Diagnoses: Active Problems:   Bipolar 2 disorder, major depressive episode ([place])   Current Medications:  Current Facility-Administered Medications  Medication Dose Route Frequency Provider Last Rate Last Admin  . acetaminophen (TYLENOL) tablet 650 mg  650 mg Oral Q6H PRN Rozetta Nunnery, NP      . alum & mag hydroxide-simeth (MAALOX/MYLANTA) 200-200-20 MG/5ML suspension 30 mL  30 mL Oral Q4H PRN Lindon Romp A, NP      . ARIPiprazole (ABILIFY) tablet 10 mg  10 mg Oral QHS Lindon Romp A, NP      . fluvoxaMINE (LUVOX) tablet 100 mg  100 mg Oral QHS Rozetta Nunnery, NP      . Derrill Memo ON 07/28/2020] influenza vac split quadrivalent PF (FLUARIX) injection 0.5 mL  0.5 mL Intramuscular Tomorrow-1000 Lindon Romp A, NP      . lamoTRIgine (LAMICTAL) tablet 100 mg  100 mg Oral QHS Lindon Romp A, NP      . magnesium hydroxide (MILK OF MAGNESIA) suspension 30 mL  30 mL Oral Daily PRN Lindon Romp A, NP      . traZODone (DESYREL) tablet 100 mg  100 mg Oral QHS Lindon Romp A, NP       PTA Medications: Medications Prior to Admission  Medication Sig Dispense Refill Last Dose  . ARIPiprazole (ABILIFY) 10 MG tablet Take 1 tablet (10 mg total) by mouth at bedtime.     . fluvoxaMINE (LUVOX) 100 MG tablet Take 1 tablet (100 mg total) by mouth at bedtime. 30 tablet 2   . lamoTRIgine (LAMICTAL) 100 MG tablet Take 1 tablet (100 mg total) by mouth at bedtime.     . Levonorgestrel (KYLEENA IU) by Intrauterine route.     . pantoprazole (PROTONIX) 40 MG tablet Take 1 tablet (40 mg total) by mouth daily. 30 tablet 1   . traZODone (DESYREL) 100 MG tablet Take 1 tablet (100 mg total) by mouth at bedtime.       Patient Stressors: Marital or family conflict Medication change or noncompliance  Patient Strengths: Systems analyst for treatment/growth  Treatment Modalities: Medication Management, Group therapy, Case management,  1 to 1 session with clinician, Psychoeducation, Recreational therapy.   Physician Treatment Plan for Primary Diagnosis: <principal problem not specified> Long Term Goal(s):     Short Term Goals:    Medication Management: Evaluate patient's response, side effects, and tolerance of medication regimen.  Therapeutic Interventions: 1 to 1 sessions, Unit Group sessions and Medication administration.  Evaluation of Outcomes: Not Met  Physician Treatment Plan for Secondary Diagnosis: Active Problems:   Bipolar 2 disorder, major depressive episode ([place])  Long Term Goal(s):     Short Term Goals:       Medication Management: Evaluate patient's response, side effects, and tolerance of medication regimen.  Therapeutic Interventions: 1 to 1 sessions, Unit Group sessions and Medication administration.  Evaluation of Outcomes: Not Met   RN Treatment Plan for Primary Diagnosis: <principal problem not specified> Long Term Goal(s): Knowledge of disease and therapeutic regimen to maintain health will improve  Short Term Goals: Ability to remain free from injury will improve, Ability to demonstrate self-control, Ability to verbalize feelings will improve and Ability to identify and develop effective coping behaviors will improve  Medication Management: RN will administer medications as ordered by provider, will assess and evaluate patient's  response and provide education to patient for prescribed medication. RN will report any adverse and/or side effects to prescribing provider.  Therapeutic Interventions: 1 on 1 counseling sessions, Psychoeducation, Medication administration, Evaluate responses to treatment, Monitor vital signs and CBGs as ordered, Perform/monitor CIWA, COWS, AIMS and Fall Risk screenings as ordered, Perform wound care treatments as ordered.  Evaluation of  Outcomes: Not Met   LCSW Treatment Plan for Primary Diagnosis: <principal problem not specified> Long Term Goal(s): Safe transition to appropriate next level of care at discharge, Engage patient in therapeutic group addressing interpersonal concerns.  Short Term Goals: Engage patient in aftercare planning with referrals and resources, Increase ability to appropriately verbalize feelings, Increase emotional regulation and Increase skills for wellness and recovery  Therapeutic Interventions: Assess for all discharge needs, 1 to 1 time with Social worker, Explore available resources and support systems, Assess for adequacy in community support network, Educate family and significant other(s) on suicide prevention, Complete Psychosocial Assessment, Interpersonal group therapy.  Evaluation of Outcomes: Not Met   Progress in Treatment: Attending groups: No. Participating in groups: No. and As evidenced by:  Pt just recently admitted. Taking medication as prescribed: Yes. Toleration medication: Yes. Family/Significant other contact made: No, will contact:  Pt's mother, consent provided. Patient understands diagnosis: Yes. Discussing patient identified problems/goals with staff: Yes. Medical problems stabilized or resolved: Yes. Denies suicidal/homicidal ideation: Yes. Issues/concerns per patient self-inventory: No. Other: None  New problem(s) identified: Yes, Describe:  Pt admitted for mood safety and stabilization.   New Short Term/Long Term Goal(s): Pt encourage to attend groups to learn new and transferable coping skills. SW will ensure Pt has appointments with current providers prior to discharge.  Patient Goals:  "Figure out better coping mechanisms"  Discharge Plan or Barriers: SW will continue to assess.  Reason for Continuation of Hospitalization: Anxiety Depression Medication stabilization  Estimated Length of Stay: 3-5 Days  Attendees: Patient: Victoria Simmons 07/27/2020  10:59 AM  Physician: Harriett Sine, NP 07/27/2020 10:59 AM  Nursing:  07/27/2020 10:59 AM  RN Care Manager: 07/27/2020 10:59 AM  Social Worker: Freddi Che, LCSW 07/27/2020 10:59 AM  Recreational Therapist:  07/27/2020 10:59 AM  Other:  07/27/2020 10:59 AM  Other:  07/27/2020 10:59 AM  Other: 07/27/2020 10:59 AM    Scribe for Treatment Team: Freddi Che, LCSW 07/27/2020 10:59 AM

## 2020-07-27 NOTE — Progress Notes (Signed)
   07/27/20 2200  Psych Admission Type (Psych Patients Only)  Admission Status Voluntary  Psychosocial Assessment  Patient Complaints Anxiety;Depression  Eye Contact Fair  Facial Expression Anxious  Affect Anxious;Depressed;Sad  Speech Logical/coherent  Interaction Assertive  Motor Activity Other (Comment) (WDL)  Appearance/Hygiene Unremarkable  Behavior Characteristics Cooperative;Anxious  Mood Depressed  Thought Process  Coherency WDL  Content WDL  Delusions None reported or observed  Perception WDL  Hallucination None reported or observed  Judgment Poor  Confusion None  Danger to Self  Current suicidal ideation? Denies  Self-Injurious Behavior No self-injurious ideation or behavior indicators observed or expressed   Agreement Not to Harm Self Yes  Description of Agreement contracts for safety verbal  Danger to Others  Danger to Others None reported or observed
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
## 2020-07-27 NOTE — Progress Notes (Signed)
D:  Patient moved to 402-1 this afternoon.  Stated this hall suited her better.  Patient denied SI and HI, contracts for safety.  Denied A/V hallucinations.  Enjoyed recreation time this afternoon outside.   A:  Medications administered per MD order.  Emotional support and encouragement given patient. R:  Safety maintained with 15 minute checks.

## 2020-07-27 NOTE — BHH Group Notes (Signed)
[facility] LCSW Group Therapy  07/27/2020 2:32 PM  Type of Therapy:  Coping Skills Outside   Participation Level:  Active  Participation Quality:  Appropriate and Attentive  Affect:  Anxious and Appropriate  Cognitive:  Appropriate  Insight:  Developing/Improving  Engagement in Therapy:  Engaged  Modes of Intervention:  Activity  Summary of Progress/Problems: Victoria Simmons shared that she likes to use music as a coping skill.  Victoria Simmons sat with the CSW's and talked openly about her life and her job.  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   Frutoso Chase [name] 07/27/2020, 2:32 PM
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
## 2020-07-28 MED ORDER — ONDANSETRON HCL 4 MG PO TABS
4.0000 mg | ORAL_TABLET | Freq: Three times a day (TID) | ORAL | Status: DC | PRN
  Administered 2020-07-29: 4 mg via ORAL
  Filled 2020-07-28: qty 1

## 2020-07-28 NOTE — Progress Notes (Signed)
[facility] MD Progress Note  07/28/2020 10:12 AM Victoria Simmons  MRN:  [id number] Subjective:  "I'm good"  Patient found laying in bed in NAD this AM. She is calm, cooperative and pleasant, displays a linear TP. She states that her mood is "good" and that she slept well. She reports some nausea this AM after breakfast which she attributes to anxiety. She states that her anxiety is related to missing her family and that she speaks to her mother daily. Denies SI/HI/AVH. She states that she attending a group this AM where handouts for coping skills were provided and that she looks forward to a group later where they will discuss.    Principal Problem: Bipolar 2 disorder, major depressive episode (HCC) Diagnosis: Principal Problem:   Bipolar 2 disorder, major depressive episode ([place])  Total Time spent with patient: 15 minutes  Past Psychiatric History: see H&P  Past Medical History:  Past Medical History:  Diagnosis Date   Anxiety    Bipolar 2 disorder ([place])    Depression    Dysmenorrhea    Hypertriglyceridemia 2020   MDD (major depressive disorder) 06/19/2017   Ovarian torsion 2008    Past Surgical History:  Procedure Laterality Date   OVARY SURGERY  06/30/2007   dx laparoscopy, reduction of right adnexal torsion, bilateral oophoropexy of each ovary to posterior uterine fundus   Family History:  Family History  Problem Relation Age of Onset   Hepatitis B Mother    Anxiety disorder Mother    Hypertension Father    ADD / ADHD Brother    Ovarian cancer Paternal Grandmother    Family Psychiatric  History: see H&P Social History:  Social History   Substance and Sexual Activity  Alcohol Use Yes   Comment: last drink on Tuesday,: pt had a glass of wine, drinks twice a month (5-7 drinks)     Social History   Substance and Sexual Activity  Drug Use Yes   Types: Marijuana   Comment: use: every day, "2 tiny bowl fulls"    Social History   Socioeconomic History    Marital status: Single    Spouse name: Not on file   Number of children: 0   Years of education: Not on file   Highest education level: Some college, no degree  Occupational History   Occupation: [employer]    Comment: full time  Tobacco Use   Smoking status: Never Smoker   Smokeless tobacco: Never Used  Scientific laboratory technician Use: Former  Substance and Sexual Activity   Alcohol use: Yes    Comment: last drink on Tuesday,: pt had a glass of wine, drinks twice a month (5-7 drinks)   Drug use: Yes    Types: Marijuana    Comment: use: every day, "2 tiny bowl fulls"   Sexual activity: Not Currently  Other Topics Concern   Not on file  Social History Narrative          Social Determinants of Health   Financial Resource Strain:    Difficulty of Paying Living Expenses: Not on file  Food Insecurity:    Worried About Charity fundraiser in the Last Year: Not on file   [employer] of Food in the Last Year: Not on file  Transportation Needs:    Lack of Transportation (Medical): Not on file   Lack of Transportation (Non-Medical): Not on file  Physical Activity:    Days of Exercise per Week: Not on file  Minutes of Exercise per Session: Not on file  Stress:    Feeling of Stress : Not on file  Social Connections:    Frequency of Communication with Friends and Family: Not on file   Frequency of Social Gatherings with Friends and Family: Not on file   Attends Religious Services: Not on file   Active Member of Clubs or Organizations: Not on file   Attends Archivist Meetings: Not on file   Marital Status: Not on file   Additional Social History:                         Sleep: Good  Appetite:  Good  Current Medications: Current Facility-Administered Medications  Medication Dose Route Frequency Provider Last Rate Last Admin   acetaminophen (TYLENOL) tablet 650 mg  650 mg Oral Q6H PRN Rozetta Nunnery, NP       alum & mag  hydroxide-simeth (MAALOX/MYLANTA) 200-200-20 MG/5ML suspension 30 mL  30 mL Oral Q4H PRN Lindon Romp A, NP   30 mL at 07/27/20 1733   ARIPiprazole (ABILIFY) tablet 10 mg  10 mg Oral QHS Lindon Romp A, NP   10 mg at 07/27/20 2117   fluvoxaMINE (LUVOX) tablet 150 mg  150 mg Oral QHS Doda, Edd Arbour, MD   150 mg at 07/27/20 2117   hydrOXYzine (ATARAX/VISTARIL) tablet 25 mg  25 mg Oral TID PRN Armando Reichert, MD   25 mg at 07/27/20 1733   influenza vac split quadrivalent PF (FLUARIX) injection 0.5 mL  0.5 mL Intramuscular Tomorrow-1000 Lindon Romp A, NP       lamoTRIgine (LAMICTAL) tablet 100 mg  100 mg Oral QHS Lindon Romp A, NP   100 mg at 07/27/20 2119   magnesium hydroxide (MILK OF MAGNESIA) suspension 30 mL  30 mL Oral Daily PRN Rozetta Nunnery, NP       traZODone (DESYREL) tablet 100 mg  100 mg Oral QHS Lindon Romp A, NP   100 mg at 07/27/20 2117    Lab Results:  Results for orders placed or performed during the hospital encounter of 07/26/20 (from the past 48 hour(s))  Respiratory Panel by RT PCR (Flu A&B, Covid) - Nasopharyngeal Swab     Status: None   Collection Time: 07/26/20  2:30 PM   Specimen: Nasopharyngeal Swab  Result Value Ref Range   SARS Coronavirus 2 by RT PCR NEGATIVE NEGATIVE    Comment: (NOTE) SARS-CoV-2 target nucleic acids are NOT DETECTED.  The SARS-CoV-2 RNA is generally detectable in upper respiratoy specimens during the acute phase of infection. The lowest concentration of SARS-CoV-2 viral copies this assay can detect is 131 copies/mL. A negative result does not preclude SARS-Cov-2 infection and should not be used as the sole basis for treatment or other patient management decisions. A negative result may occur with  improper specimen collection/handling, submission of specimen other than nasopharyngeal swab, presence of viral mutation(s) within the areas targeted by this assay, and inadequate number of viral copies (<131 copies/mL). A negative result  must be combined with clinical observations, patient history, and epidemiological information. The expected result is Negative.  Fact Sheet for Patients:  PinkCheek.be  Fact Sheet for Healthcare Providers:  GravelBags.it  This test is no t yet approved or cleared by the [place] FDA and  has been authorized for detection and/or diagnosis of SARS-CoV-2 by FDA under an Emergency Use Authorization (EUA). This EUA will remain  in effect (meaning this test  can be used) for the duration of the COVID-19 declaration under Section 564(b)(1) of the Act, 21 U.S.C. section 360bbb-3(b)(1), unless the authorization is terminated or revoked sooner.     Influenza A by PCR NEGATIVE NEGATIVE   Influenza B by PCR NEGATIVE NEGATIVE    Comment: (NOTE) The Xpert Xpress SARS-CoV-2/FLU/RSV assay is intended as an aid in  the diagnosis of influenza from Nasopharyngeal swab specimens and  should not be used as a sole basis for treatment. Nasal washings and  aspirates are unacceptable for Xpert Xpress SARS-CoV-2/FLU/RSV  testing.  Fact Sheet for Patients: PinkCheek.be  Fact Sheet for Healthcare Providers: GravelBags.it  This test is not yet approved or cleared by the [place] FDA and  has been authorized for detection and/or diagnosis of SARS-CoV-2 by  FDA under an Emergency Use Authorization (EUA). This EUA will remain  in effect (meaning this test can be used) for the duration of the  Covid-19 declaration under Section 564(b)(1) of the Act, 21  U.S.C. section 360bbb-3(b)(1), unless the authorization is  terminated or revoked. Performed at [facility], [place] [address], [place], [place] [address]   CBC with Differential/Platelet     Status: None   Collection Time: 07/26/20  2:33 PM  Result Value Ref Range   WBC 9.7 4.0 - 10.5 K/uL   RBC 4.94 3.87 - 5.11 MIL/uL    Hemoglobin 13.9 12.0 - 15.0 g/dL   HCT 43.0 36 - 46 %   MCV 87.0 80.0 - 100.0 fL   MCH 28.1 26.0 - 34.0 pg   MCHC 32.3 30.0 - 36.0 g/dL   RDW 12.6 11.5 - 15.5 %   Platelets 245 150 - 400 K/uL   nRBC 0.0 0.0 - 0.2 %   Neutrophils Relative % 65 %   Neutro Abs 6.4 1.7 - 7.7 K/uL   Lymphocytes Relative 24 %   Lymphs Abs 2.3 0.7 - 4.0 K/uL   Monocytes Relative 7 %   Monocytes Absolute 0.7 0.1 - 1.0 K/uL   Eosinophils Relative 2 %   Eosinophils Absolute 0.2 0.0 - 0.5 K/uL   Basophils Relative 1 %   Basophils Absolute 0.1 0.0 - 0.1 K/uL   Immature Granulocytes 1 %   Abs Immature Granulocytes 0.05 0.00 - 0.07 K/uL    Comment: Performed at [facility], [address] [place], [place] [address]  Comprehensive metabolic panel     Status: None   Collection Time: 07/26/20  2:33 PM  Result Value Ref Range   Sodium 139 135 - 145 mmol/L   Potassium 3.6 3.5 - 5.1 mmol/L   Chloride 104 98 - 111 mmol/L   CO2 24 22 - 32 mmol/L   Glucose, Bld 82 70 - 99 mg/dL    Comment: Glucose reference range applies only to samples taken after fasting for at least 8 hours.   BUN 11 6 - 20 mg/dL   Creatinine, Ser 0.95 0.44 - 1.00 mg/dL   Calcium 9.5 8.9 - 10.3 mg/dL   Total Protein 7.3 6.5 - 8.1 g/dL   Albumin 4.5 3.5 - 5.0 g/dL   AST 24 15 - 41 U/L   ALT 44 0 - 44 U/L   Alkaline Phosphatase 66 38 - 126 U/L   Total Bilirubin 0.7 0.3 - 1.2 mg/dL   GFR, Estimated >60 >60 mL/min   Anion gap 11 5 - 15    Comment: Performed at [facility] [address], [place], [place]  [address]  Hemoglobin A1c     Status: None   Collection Time: 07/26/20  2:33 PM  Result Value Ref Range   Hgb A1c MFr Bld 5.0 4.8 - 5.6 %    Comment: (NOTE)         Prediabetes: 5.7 - 6.4         Diabetes: >6.4         Glycemic control for adults with diabetes: <7.0    Mean Plasma Glucose 97 mg/dL    Comment: (NOTE) Performed At: [facility] [place], [place] [id number] Rush Farmer MD  [id number]   Ethanol     Status: None   Collection Time: 07/26/20  2:33 PM  Result Value Ref Range   Alcohol, Ethyl (B) <10 <10 mg/dL    Comment: (NOTE) Lowest detectable limit for serum alcohol is 10 mg/dL.  For medical purposes only. Performed at [facility], [place] [address], [place], [place] [address]   TSH     Status: None   Collection Time: 07/26/20  2:33 PM  Result Value Ref Range   TSH 2.401 0.350 - 4.500 uIU/mL    Comment: Performed by a 3rd Generation assay with a functional sensitivity of <=0.01 uIU/mL. Performed at [facility], [place] [address], [place], [place] [address]   Lipid panel     Status: Abnormal   Collection Time: 07/26/20  2:33 PM  Result Value Ref Range   Cholesterol 226 (H) 0 - 200 mg/dL   Triglycerides 138 <150 mg/dL   HDL 44 >40 mg/dL   Total CHOL/HDL Ratio 5.1 RATIO   VLDL 28 0 - 40 mg/dL   LDL Cholesterol 154 (H) 0 - 99 mg/dL    Comment:        Total Cholesterol/HDL:CHD Risk Coronary Heart Disease Risk Table                     Men   Women  1/2 Average Risk   3.4   3.3  Average Risk       5.0   4.4  2 X Average Risk   9.6   7.1  3 X Average Risk  23.4   11.0        Use the calculated Patient Ratio above and the CHD Risk Table to determine the patient's CHD Risk.        ATP III CLASSIFICATION (LDL):  <100     mg/dL   Optimal  100-129  mg/dL   Near or Above                    Optimal  130-159  mg/dL   Borderline  160-189  mg/dL   High  >190     mg/dL   Very High Performed at [facility] [address] [place], [place] [address]   POCT Urine Drug Screen - (ICup)     Status: Abnormal   Collection Time: 07/26/20  2:36 PM  Result Value Ref Range   POC Amphetamine UR None Detected None Detected   POC Secobarbital (BAR) None Detected None Detected   POC Buprenorphine (BUP) None Detected None Detected   POC Oxazepam (BZO) None Detected None Detected   POC Cocaine UR None Detected None Detected   POC Methamphetamine  UR None Detected None Detected   POC Morphine None Detected None Detected   POC Oxycodone UR None Detected None Detected   POC Methadone UR None  Detected None Detected   POC Marijuana UR Positive (A) None Detected  POC SARS Coronavirus 2 Ag     Status: None   Collection Time: 07/26/20  2:39 PM  Result Value Ref Range   SARS Coronavirus 2 Ag NEGATIVE NEGATIVE    Comment: (NOTE) SARS-CoV-2 antigen NOT DETECTED.   Negative results are presumptive.  Negative results do not preclude SARS-CoV-2 infection and should not be used as the sole basis for treatment or other patient management decisions, including infection  control decisions, particularly in the presence of clinical signs and  symptoms consistent with COVID-19, or in those who have been in contact with the virus.  Negative results must be combined with clinical observations, patient history, and epidemiological information. The expected result is Negative.  Fact Sheet for Patients: PodPark.tn  Fact Sheet for Healthcare Providers: GiftContent.is   This test is not yet approved or cleared by the [place] FDA and  has been authorized for detection and/or diagnosis of SARS-CoV-2 by FDA under an Emergency Use Authorization (EUA).  This EUA will remain in effect (meaning this test can be used) for the duration of  the C OVID-19 declaration under Section 564(b)(1) of the Act, 21 U.S.C. section 360bbb-3(b)(1), unless the authorization is terminated or revoked sooner.    Pregnancy, urine POC     Status: None   Collection Time: 07/26/20  2:39 PM  Result Value Ref Range   Preg Test, Ur NEGATIVE NEGATIVE    Comment:        THE SENSITIVITY OF THIS METHODOLOGY IS >24 mIU/mL     Blood Alcohol level:  Lab Results  Component Value Date   ETH <10 19/62/2297    Metabolic Disorder Labs: Lab Results  Component Value Date   HGBA1C 5.0 07/26/2020   MPG 97 07/26/2020    MPG 94 05/14/2017   No results found for: PROLACTIN Lab Results  Component Value Date   CHOL 226 (H) 07/26/2020   TRIG 138 07/26/2020   HDL 44 07/26/2020   CHOLHDL 5.1 07/26/2020   VLDL 28 07/26/2020   LDLCALC 154 (H) 07/26/2020   LDLCALC 103 (H) 04/05/2020    Physical Findings: AIMS:  , ,  ,  ,    CIWA:    COWS:     Musculoskeletal: Strength & Muscle Tone: within normal limits Gait & Station: normal Patient leans: N/A  Psychiatric Specialty Exam: Physical Exam Constitutional:      Appearance: Normal appearance. She is normal weight.  HENT:     Head: Normocephalic and atraumatic.  Eyes:     Extraocular Movements: Extraocular movements intact.  Pulmonary:     Effort: Pulmonary effort is normal.  Musculoskeletal:     Cervical back: Normal range of motion.  Neurological:     Mental Status: She is alert.     Review of Systems  Constitutional: Negative for fever.  Respiratory: Negative for shortness of breath.   Cardiovascular: Negative for chest pain.  Gastrointestinal: Positive for nausea. Negative for constipation, diarrhea and vomiting.    Blood pressure 102/65, pulse (!) 114, temperature 98.2 F (36.8 C), temperature source Oral, resp. rate 18, height 5\' 6"  (1.676 m), weight 81.2 kg, SpO2 95 %.Body mass index is 28.89 kg/m.  General Appearance: Casual and Fairly Groomed  Eye Contact:  Good  Speech:  Clear and Coherent and Normal Rate  Volume:  Normal  Mood:  "good"  Affect:  Appropriate, Constricted and euthymic  Thought Process:  Coherent, Goal Directed and Linear  Orientation:  Full (Time, Place, and Person)  Thought Content:  WDL and Logical  Suicidal Thoughts:  No  Homicidal Thoughts:  No  Memory:  Immediate;   Fair Recent;   Fair  Judgement:  Good  Insight:  Good  Psychomotor Activity:  Normal  Concentration:  Concentration: Good  Recall:  Good  Fund of Knowledge:  Good  Language:  Good  Akathisia:  No  Handed:  Right  AIMS (if  indicated):     Assets:  Communication Skills Desire for Improvement Financial Resources/Insurance Housing Social Support  ADL's:  Intact  Cognition:  WNL  Sleep:  Number of Hours: 6.75     Treatment Plan Summary: Daily contact with patient to assess and evaluate symptoms and progress in treatment and Medication management  Bipolar disorder, MRE depressed anxiety  Daily contact with patient to assess and evaluate symptoms and progress in treatment -Monitor Vitals. -Monitor for Suicidal Ideation. -Monitor for withdrawal symptoms. -Monitor for medication side effects. -Continue Lamictal 100 mg QHS -Continue Luvox to 150 mg QHS -Start Abilify 10 mg QHS -Start Hydroxyzine 25 mg TID PRN for Anxiety. -Start Trazodone 100 mg QHS PRN for sleep. -Patient will participate in the therapeutic group milieu   Ival Bible, MD 07/28/2020, 10:12 AM

## 2020-07-28 NOTE — Progress Notes (Signed)
Pt A & O X4. Observed in at medication window on initial approach. Brightened up on interactions as shift progressed. States she slept well last night with good appetite. Denies SI, HI and AVH. Received PRN Tylenol 650 mg PO at 1756 this evening for c/o "bad earache, I can't even open my mouth. It's 10/10".  Rates her depression and anxiety both 3/10 with stressor of "just being here and not being home with family. No visitation does not make it easier". Observed in dayroom at intervals and went off unit for activities when prompted. Emotional support and encouragement offered to pt this shift. All medication given as ordered. Writer informed patient of the use of unit IPAD (face time communications) to contact family members. Q 15 minutes safety checks maintained without incident.   Pt asleep in bed when reassessed at 1850 post Tylenol administration. Cooperative with care. Tolerates meals well. Safety maintained on and off unit.

## 2020-07-29 ENCOUNTER — Other Ambulatory Visit (HOSPITAL_COMMUNITY): Payer: Self-pay | Admitting: Psychiatric/Mental Health

## 2020-07-29 MED ORDER — HYDROXYZINE HCL 25 MG PO TABS
25.0000 mg | ORAL_TABLET | Freq: Three times a day (TID) | ORAL | 0 refills | Status: DC | PRN
Start: 2020-07-29 — End: 2020-09-09

## 2020-07-29 MED ORDER — FLUVOXAMINE MALEATE 50 MG PO TABS
150.0000 mg | ORAL_TABLET | Freq: Every day | ORAL | 0 refills | Status: DC
Start: 2020-07-29 — End: 2020-08-05

## 2020-07-29 MED ORDER — FLUVOXAMINE MALEATE 50 MG PO TABS: 150.0000 mg | ORAL_TABLET | Freq: Every day | ORAL | 0 refills | Status: DC

## 2020-07-29 MED ORDER — AMOXICILLIN-POT CLAVULANATE 875-125 MG PO TABS: 1.0000 | ORAL_TABLET | Freq: Two times a day (BID) | ORAL | 0 refills | Status: DC

## 2020-07-29 MED ORDER — AMOXICILLIN-POT CLAVULANATE 875-125 MG PO TABS
1.0000 | ORAL_TABLET | Freq: Two times a day (BID) | ORAL | Status: DC
  Filled 2020-07-29 (×5): qty 1

## 2020-07-29 MED ORDER — HYDROXYZINE HCL 25 MG PO TABS
25.0000 mg | ORAL_TABLET | Freq: Three times a day (TID) | ORAL | 0 refills | Status: DC | PRN
Start: 2020-07-29 — End: 2020-07-29

## 2020-07-29 MED FILL — AMOX-CLAV 875-125 MG TABLET: 875-125 | 7 days supply | Qty: 14 | Fill #0

## 2020-07-29 MED FILL — hydrOXYzine HCL 25 MG TABS: 25 | 25 days supply | Qty: 75 | Fill #0

## 2020-07-29 MED FILL — FLUVOXAMINE MALEATE 50 MG T: 50 | 30 days supply | Qty: 90 | Fill #0

## 2020-07-29 NOTE — Progress Notes (Signed)
Pt statedd she was doing better. Pt spent much of the evening in her room    07/29/20 0000  Psych Admission Type (Psych Patients Only)  Admission Status Voluntary  Psychosocial Assessment  Patient Complaints Anxiety;Depression  Eye Contact Fair  Facial Expression Anxious  Affect Anxious;Depressed;Sad  Speech Logical/coherent  Interaction Assertive  Motor Activity Other (Comment) (WDL)  Appearance/Hygiene Unremarkable  Behavior Characteristics Cooperative  Mood Anxious  Thought Process  Coherency WDL  Content WDL  Delusions None reported or observed  Perception WDL  Hallucination None reported or observed  Judgment Poor  Confusion None  Danger to Self  Current suicidal ideation? Denies  Self-Injurious Behavior No self-injurious ideation or behavior indicators observed or expressed   Agreement Not to Harm Self Yes  Description of Agreement Pt verbally contracts for safety  Danger to Others  Danger to Others None reported or observed
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
## 2020-07-29 NOTE — Progress Notes (Signed)
°  [facility] Adult Case Management Discharge Plan :  Will you be returning to the same living situation after discharge:  Yes,  Home with mother  At discharge, do you have transportation home?: Yes,  Mother  Do you have the ability to pay for your medications: Yes,  Insurance   Release of information consent forms completed and in the chart;  Patient's signature needed at discharge.  Patient to Follow up at:  Follow-up Information    [facility] Follow up on 08/05/2020.   Specialty: Behavioral Health Why: You have an appointment for medication management on 08/05/20 at 10:00 am.  You also have an appointment for therapy on 08/08/20 at 1:30 pm.  Both of these appointments are Virtual. Contact information: [place] [phone]              Next level of care provider has access to [place] and Suicide Prevention discussed: Yes,  with Patient      Has patient been referred to the Quitline?: N/A patient is not a smoker  Patient has been referred for addiction treatment: N/A  Darleen Crocker, [place] 07/29/2020, 9:14 AM

## 2020-07-29 NOTE — BHH Suicide Risk Assessment (Signed)
[facility] Discharge Suicide Risk Assessment   Principal Problem: Bipolar 2 disorder, major depressive episode ([place]) Discharge Diagnoses: Principal Problem:   Bipolar 2 disorder, major depressive episode ([place])   Total Time spent with patient: 15 minutes  Musculoskeletal: Strength & Muscle Tone: within normal limits Gait & Station: normal Patient leans: N/A  Psychiatric Specialty Exam: Review of Systems  HENT: Positive for ear pain.   Respiratory: Negative for shortness of breath.   Cardiovascular: Negative for chest pain.  Neurological: Negative for facial asymmetry.    Blood pressure 106/67, pulse 91, temperature 98 F (36.7 C), temperature source Oral, resp. rate 18, height 5\' 6"  (1.676 m), weight 81.2 kg, SpO2 97 %.Body mass index is 28.89 kg/m.  General Appearance: Casual and Fairly Groomed  Eye Contact::  Good  Speech:  Clear and Coherent and Normal Rate409  Volume:  Normal  Mood:  "good"  Affect:  Appropriate, Constricted and euthymic  Thought Process:  Coherent, Goal Directed and Linear  Orientation:  Full (Time, Place, and Person)  Thought Content:  WDL and Logical  Suicidal Thoughts:  No  Homicidal Thoughts:  No  Memory:  Immediate;   Fair Recent;   Fair  Judgement:  Good  Insight:  Good  Psychomotor Activity:  Normal  Concentration:  Good  Recall:  Good  Fund of Knowledge:Good  Language: Good  Akathisia:  No  Handed:  Right  AIMS (if indicated):     Assets:  Communication Skills Desire for Improvement Housing Resilience Social Support  Sleep:  Number of Hours: 6.75  Cognition: WNL  ADL's:  Intact   Mental Status Per Nursing Assessment::   On Admission:  Suicidal ideation indicated by patient  Demographic Factors:  Adolescent or young adult  Loss Factors: Decline in physical health  Historical Factors: Prior suicide attempts  Risk Reduction Factors:   Sense of responsibility to family, Living with another person, especially a relative, Positive  social support, Positive therapeutic relationship and Positive coping skills or problem solving skills  Continued Clinical Symptoms:  Previous Psychiatric Diagnoses and Treatments Medical Diagnoses and Treatments/Surgeries  Cognitive Features That Contribute To Risk:  None    Suicide Risk:  Minimal: No identifiable suicidal ideation.  Patients presenting with no risk factors but with morbid ruminations; may be classified as minimal risk based on the severity of the depressive symptoms   Follow-up [facility] Follow up on 08/05/2020.   Specialty: Behavioral Health Why: You have an appointment for medication management on 08/05/20 at 10:00 am.  You also have an appointment for therapy on 08/08/20 at 1:30 pm.  Both of these appointments are Virtual. Contact information: [place] [phone]              Plan Of Care/Follow-up recommendations:  Activity:  as tolerated Diet:  regular Other:      Patient is instructed prior to discharge to: Take all medications as prescribed by his/her mental healthcare provider. Report any adverse effects and or reactions from the medicines to his/her outpatient provider promptly. Patient has been instructed & cautioned: To not engage in alcohol and or illegal drug use while on prescription medicines. In the event of worsening symptoms, patient is instructed to call the crisis hotline, 911 and or go to the nearest ED for appropriate evaluation and treatment of symptoms. To follow-up with his/her primary care provider for your other medical issues, concerns and or health care  needs.     Ival Bible, MD 07/29/2020, 9:54 AM

## 2020-07-29 NOTE — Progress Notes (Signed)
D: Pt A & O X 4. Presents animated but anxious with logical speech and fair eye contact. Reports she slept fair last night due to ear pain and appetite is good. Denies SI, HI and AVH at this time. Rates her pain 5/10 "I don't want the Tylenol because it did not help me last night". D/C home as ordered. Picked up in lobby by her mother. A: D/C instructions reviewed with pt including prescriptionsand follow up appointment; compliance encouraged. All belongings from locker 47 given to pt at time of departure. Safety checks maintained without incident till time of d/c.  R: Pt receptive to care. Verbalized understanding related to d/c instructions. Signed belonging sheet in agreement with items received from locker. Ambulatory with a steady gait. Appears to be in no physical distress at time of departure.

## 2020-08-01 MED FILL — traZODone HCL 100 MG TABS: 100 | 30 days supply | Qty: 45 | Fill #1

## 2020-08-01 MED FILL — lamoTRIgine 100 MG TABS: 100 | 30 days supply | Qty: 60 | Fill #1

## 2020-08-05 ENCOUNTER — Other Ambulatory Visit: Payer: Self-pay

## 2020-08-05 ENCOUNTER — Telehealth (INDEPENDENT_AMBULATORY_CARE_PROVIDER_SITE_OTHER): Payer: No Typology Code available for payment source | Admitting: Child and Adolescent Psychiatry

## 2020-08-05 ENCOUNTER — Ambulatory Visit
Admission: EM | Admit: 2020-08-05 | Discharge: 2020-08-05 | Disposition: A | Discharge status: Home / Self Care | Payer: No Typology Code available for payment source | Attending: Physician Assistant | Admitting: Physician Assistant

## 2020-08-05 ENCOUNTER — Encounter: Payer: Self-pay | Admitting: Emergency Medicine

## 2020-08-05 ENCOUNTER — Other Ambulatory Visit: Payer: Self-pay | Admitting: Child and Adolescent Psychiatry

## 2020-08-05 DIAGNOSIS — F3181 Bipolar II disorder: Secondary | ICD-10-CM

## 2020-08-05 DIAGNOSIS — F411 Generalized anxiety disorder: Secondary | ICD-10-CM | POA: Diagnosis not present

## 2020-08-05 DIAGNOSIS — H60393 Other infective otitis externa, bilateral: Secondary | ICD-10-CM | POA: Diagnosis not present

## 2020-08-05 MED ORDER — FLUTICASONE PROPIONATE 50 MCG/ACT NA SUSP: 2.0000 | Freq: Every day | NASAL | 0 refills | Status: DC

## 2020-08-05 MED ORDER — LAMOTRIGINE 100 MG PO TABS: 150.0000 mg | ORAL_TABLET | Freq: Every day | ORAL | 0 refills | Status: DC

## 2020-08-05 MED ORDER — OFLOXACIN 0.3 % OT SOLN: 10.0000 [drp] | Freq: Every day | OTIC | 0 refills | Status: DC

## 2020-08-05 MED ORDER — TRAZODONE HCL 100 MG PO TABS: 100.0000 mg | ORAL_TABLET | Freq: Every day | ORAL | 0 refills | Status: DC

## 2020-08-05 MED ORDER — FLUVOXAMINE MALEATE 50 MG PO TABS: 150.0000 mg | ORAL_TABLET | Freq: Every day | ORAL | 0 refills | Status: DC

## 2020-08-05 MED ORDER — ARIPIPRAZOLE 10 MG PO TABS: 10.0000 mg | ORAL_TABLET | Freq: Every day | ORAL | 0 refills | Status: DC

## 2020-08-05 MED FILL — OFLOXACIN 0.3 % SOLN: 0.3 | 10 days supply | Qty: 10 | Fill #0

## 2020-08-05 MED FILL — FLUTICASONE PROP 50 MCG SPR: 50 | 30 days supply | Qty: 16 | Fill #0

## 2020-08-05 MED FILL — ARIPIPRAZOLE 10 MG TABS: 10 | 30 days supply | Qty: 30 | Fill #0

## 2020-08-05 NOTE — Progress Notes (Signed)
Virtual Visit via Video Note  I connected with CORLEY KOHLS on 08/05/20 at 10:00 AM EDT by a video enabled telemedicine application and verified that I am speaking with the correct person using two identifiers.  Location: Patient: [place] Provider: Office   I discussed the limitations of evaluation and management by telemedicine and the availability of in person appointments. The patient expressed understanding and agreed to proceed.  BH MD/PA/NP OP Progress Note  08/05/2020 12:46 PM Bethan PAYLIN HAILU  MRN:  [id number]  Chief Complaint: Medication management follow-up for bipolar disorder, GAD, insomnia.  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 Einar Grad since 2017 and transition to this writer in April 2020.  She is currently prescribed Lamictal 200 mg once a day, Abilify 10 mg once a day, trazodone 150 mg once a day and Luvox 100 mg once a day.  HPI: Patient was seen and evaluated over telemedicine encounter for medication management follow-up. In the interim since last appointment patient was admitted to [facility] due to worsening of anxiety, depression and suicidal thoughts with plan to overdose on medications. It appears from the discharge summary that her fluoxetine was increased to 150 mg once a day while Lamictal was decreased to 200 mg once a day and she was continued on rest of her medications prescribed at the last appointment. It does not appear that decreasing Lamictal was deliberate and perhaps most likely in the context of miscommunication between patient and the treatment team at [place].  Today she reports that she corroborates the history that led to her hospitalization. She reports that she was noticing worsening of mood, much more anxious in the context of home and her work related stressors. She reports that she spoke with her therapist and she suggested her to go to behavioral health  urgent care in [place] and was subsequently admitted.   She reports that she is doing much better since the discharge from the hospital, anxiety has not gone away completely but has been better and manageable. She reports that her mood has been improving and describes her mood has been neutral. She denies any suicidal thoughts since the discharge from the hospital. She reports that she has continued to work. She reports that she plans to move out to her own place in December. She reports that she has been sleeping well, eating well. She was unaware if she was taking Lamictal 100 mg a day or 200 mg a day and she was recommended to increase the dose of Lamictal to 150 mg once a day if she was taking 100 mg once a day and continue with Lamictal 200 mg once a day if she was taking Lamictal 200 mg once a day before.  She denies any concerns for today's appointment. She reports that she continues to use marijuana but cutting down on it and currently smokes about 1 bowl of marijuana once a day or once every other day. She reports that she drinks about 4-7 drinks twice a month.  She reports that she has spoken with her therapist and now they will be seeing frequently for therapy. We discussed to continue her current medications and plan to follow-up and 4 weeks currently if symptoms worsens or as needed.  Visit Diagnosis:    ICD-10-CM   1. Bipolar 2 disorder, major depressive episode (HCC)  F31.81 lamoTRIgine (LAMICTAL) 100 MG tablet    ARIPiprazole (ABILIFY) 10  MG tablet  2. GAD (generalized anxiety disorder)  F41.1 fluvoxaMINE (LUVOX) 50 MG tablet    Past Psychiatric History: PPHx reviewed today and as followin. 1 previous inpatient admission at [facility] at the age of 20, no previous suicide attempts, has history of cutting and her past medication trials include Prozac and Zoloft.  Decreasing Luvox to 50 mg worsened her symptoms of anxiety therefore it was increased back to 100 mg once a day.   Previous  trials of Zoloft 150 mg and Prozac 10 mg stopped due to inefficacy.   Past Medical History:  Past Medical History:  Diagnosis Date  . Anxiety   . Bipolar 2 disorder ([place])   . Depression   . Dysmenorrhea   . Hypertriglyceridemia 2020  . MDD (major depressive disorder) 06/19/2017  . Ovarian torsion 2008    Past Surgical History:  Procedure Laterality Date  . OVARY SURGERY  06/30/2007   dx laparoscopy, reduction of right adnexal torsion, bilateral oophoropexy of each ovary to posterior uterine fundus    Family Psychiatric History: As mentioned in initial H&P, reviewed today, no change Family History:  Family History  Problem Relation Age of Onset  . Hepatitis B Mother   . Anxiety disorder Mother   . Hypertension Father   . ADD / ADHD Brother   . Ovarian cancer Paternal Grandmother     Social History:  Social History   Socioeconomic History  . Marital status: Single    Spouse name: Not on file  . Number of children: 0  . Years of education: Not on file  . Highest education level: Some college, no degree  Occupational History  . Occupation: [employer]    Comment: full time  Tobacco Use  . Smoking status: Never Smoker  . Smokeless tobacco: Never Used  Vaping Use  . Vaping Use: Former  Substance and Sexual Activity  . Alcohol use: Yes    Comment: last drink on Tuesday,: pt had a glass of wine, drinks twice a month (5-7 drinks)  . Drug use: Yes    Types: Marijuana    Comment: use: every day, "2 tiny bowl fulls"  . Sexual activity: Not Currently  Other Topics Concern  . Not on file  Social History Narrative          Social Determinants of Health   Financial Resource Strain:   . Difficulty of Paying Living Expenses: Not on file  Food Insecurity:   . Worried About Charity fundraiser in the Last Year: Not on file  . Ran Out of Food in the Last Year: Not on file  Transportation Needs:   . Lack of Transportation (Medical): Not on file  . Lack of  Transportation (Non-Medical): Not on file  Physical Activity:   . Days of Exercise per Week: Not on file  . Minutes of Exercise per Session: Not on file  Stress:   . Feeling of Stress : Not on file  Social Connections:   . Frequency of Communication with Friends and Family: Not on file  . Frequency of Social Gatherings with Friends and Family: Not on file  . Attends Religious Services: Not on file  . Active Member of Clubs or Organizations: Not on file  . Attends Archivist Meetings: Not on file  . Marital Status: Not on file    Allergies: No Known Allergies  Metabolic Disorder Labs: Lab Results  Component Value Date   HGBA1C 5.0 07/26/2020   MPG 97 07/26/2020  MPG 94 05/14/2017   No results found for: PROLACTIN Lab Results  Component Value Date   CHOL 226 (H) 07/26/2020   TRIG 138 07/26/2020   HDL 44 07/26/2020   CHOLHDL 5.1 07/26/2020   VLDL 28 07/26/2020   LDLCALC 154 (H) 07/26/2020   LDLCALC 103 (H) 04/05/2020   Lab Results  Component Value Date   TSH 2.401 07/26/2020   TSH 2.64 04/05/2020    Therapeutic Level Labs: No results found for: LITHIUM No results found for: VALPROATE No components found for:  CBMZ  Current Medications: Current Outpatient Medications  Medication Sig Dispense Refill  . amoxicillin-clavulanate (AUGMENTIN) 875-125 MG tablet Take 1 tablet by mouth every 12 (twelve) hours for 7 days. 14 tablet 0  . ARIPiprazole (ABILIFY) 10 MG tablet Take 1 tablet (10 mg total) by mouth at bedtime. 30 tablet 0  . fluticasone (FLONASE) 50 MCG/ACT nasal spray Place 2 sprays into both nostrils daily. 1 g 0  . fluvoxaMINE (LUVOX) 50 MG tablet Take 3 tablets (150 mg total) by mouth at bedtime. 90 tablet 0  . hydrOXYzine (ATARAX/VISTARIL) 25 MG tablet Take 1 tablet (25 mg total) by mouth 3 (three) times daily as needed for anxiety. 75 tablet 0  . lamoTRIgine (LAMICTAL) 100 MG tablet Take 1.5 tablets (150 mg total) by mouth at bedtime. 45 tablet 0  .  Levonorgestrel (KYLEENA IU) by Intrauterine route.    Marland Kitchen ofloxacin (FLOXIN) 0.3 % OTIC solution Place 10 drops into both ears daily for 7 days. 3.5 mL 0  . pantoprazole (PROTONIX) 40 MG tablet Take 1 tablet (40 mg total) by mouth daily. 30 tablet 1  . traZODone (DESYREL) 100 MG tablet Take 1 tablet (100 mg total) by mouth at bedtime. 30 tablet 0   No current facility-administered medications for this visit.     Musculoskeletal: Strength & Muscle Tone: unable to assess since visit was over the telemedicine. Gait & Station: unable to assess since visit was over the telemedicine. Patient leans: N/A  Psychiatric Specialty Exam: ROSReview of 12 systems negative except as mentioned in HPI   There were no vitals taken for this visit.There is no height or weight on file to calculate BMI.  General Appearance: Casual  Eye Contact:  Good  Speech:  Clear and Coherent and Normal Rate  Volume:  Normal  Mood: "better"  Affect:  Appropriate, Congruent and Full Range  Thought Process:  Goal Directed and Linear  Orientation:  Full (Time, Place, and Person)  Thought Content: Logical   Suicidal Thoughts:  No  Homicidal Thoughts:  No  Memory:  Immediate;   Fair Recent;   Fair Remote;   Fair  Judgement:  Good  Insight:  Good  Psychomotor Activity:  Normal  Concentration:  Concentration: Good and Attention Span: Good  Recall:  Good  Fund of Knowledge: Good  Language: Good  Akathisia:  No    AIMS (if indicated): not done  Assets:  Communication Skills Desire for Improvement Financial Resources/Insurance Housing Leisure Time Physical Health Social Support Transportation Vocational/Educational  ADL's:  Intact  Cognition: WNL  Sleep:  Good     Screenings: AIMS     Admission (Discharged) from 05/13/2017 in [place] Total Score 0    AUDIT     Admission (Discharged) from 07/27/2020 in [place] [facility]B Admission (Discharged)  from 05/13/2017 in [place]  Alcohol Use Disorder Identification Test Final Score (AUDIT) 6 0  GAD-7     Office Visit from 04/05/2020 in [place] at [facility]  Total GAD-7 Score 7    PHQ2-9     Counselor from 07/26/2020 in [place] from 06/07/2020 in [place] Office Visit from [facility]/05/2020 in [place] [facility] Visit from 06/19/2017 in [place] at [facility]  PHQ-2 Total Score 5 0 1 0  PHQ-9 Total Score 22 7 7 4        Assessment and Plan:   Victoria Simmons is a 24 yr old Caucasian female who is single, employed, has a history of bipolar disorder, generalized anxiety disorder, insomnia evaluated today for a follow-up visit for medication management follow up after her recent discharge from inpatient psychiatric unit one week ago for suicidal thoughts, worsening of mood, anxiety. She reports improvement in mood and anxiety since the discharge and no suicidal thoughts.  She does not appear depressed, manic/hypomanic or psychotic. Has been using MJA and intermittently drinks alcohol. Reviewed response to current medication and recommneding medications as below. Plan as below.    Plan  Bipolar 2 disorder (chronic, improving) Increase lamotrigine to 150 mg daily, it appears that treatment team on inpatient decreased from 200 mg p.o. daily due to miscommunication.  Continue withLuvox 150 mg p.o. daily Continue Abilify at10mg  po qd.  For generalized anxiety disorder-(chronic and stable) -  ind therapy at [facility],  Luvox as mentioned above.  Medications above will be helpful in decreasing anxiety. Previously tried Prozac upto 10 mg daily and Zoloft 150 mg daily - stopped due to inefficacy.   For insomnia-chronic and stable Continue Trazodone at150mg  p.o. nightly       Follow Up Instructions:    I discussed the assessment and treatment plan with the  patient. The patient was provided an opportunity to ask questions and all were answered. The patient agreed with the plan and demonstrated an understanding of the instructions.   The patient was advised to call back or seek an in-person evaluation if the symptoms worsen or if the condition fails to improve as anticipated   Orlene Erm, MD 08/05/2020, 12:46 PM

## 2020-08-05 NOTE — ED Provider Notes (Signed)
[facility]    CSN: [id number] Arrival date & time: 08/05/20  0803      History   Chief Complaint Chief Complaint  Patient presents with  . Otalgia    HPI Victoria Simmons is a 24 y.o. female.   24 year old female comes in for 8 day history of bilateral ear pain. Last day of augmentin x 7 days without relief. Has had nasal congestion. Denies cough. No fevers.      Past Medical History:  Diagnosis Date  . Anxiety   . Bipolar 2 disorder ([place])   . Depression   . Dysmenorrhea   . Hypertriglyceridemia 2020  . MDD (major depressive disorder) 06/19/2017  . Ovarian torsion 2008    Patient Active Problem List   Diagnosis Date Noted  . Bloody diarrhea 04/06/2020  . Epigastric abdominal pain 04/06/2020  . Well woman exam 06/19/2017  . HLD (hyperlipidemia) 06/19/2017  . Bipolar 2 disorder, major depressive episode ([place]) 05/13/2017  . GAD (generalized anxiety disorder) 05/13/2017  . Menorrhagia 01/25/2014  . Family history of hypercholesterolemia 01/06/2013  . Murmur 01/06/2013    Past Surgical History:  Procedure Laterality Date  . OVARY SURGERY  06/30/2007   dx laparoscopy, reduction of right adnexal torsion, bilateral oophoropexy of each ovary to posterior uterine fundus    OB History    Gravida  0   Para  0   Term  0   Preterm  0   AB  0   Living  0     SAB  0   TAB  0   Ectopic  0   Multiple  0   Live Births  0            Home Medications    Prior to Admission medications   Medication Sig Start Date End Date Taking? Authorizing Provider  amoxicillin-clavulanate (AUGMENTIN) 875-125 MG tablet Take 1 tablet by mouth every 12 (twelve) hours for 7 days. 07/29/20 08/05/20  Connye Burkitt, NP  ARIPiprazole (ABILIFY) 10 MG tablet Take 1 tablet (10 mg total) by mouth at bedtime. 07/26/20   Money, Lowry Ram, FNP  fluticasone (FLONASE) 50 MCG/ACT nasal spray Place 2 sprays into both nostrils daily. 08/05/20   Tasia Catchings, Farren Nelles V, PA-C    fluvoxaMINE (LUVOX) 50 MG tablet Take 3 tablets (150 mg total) by mouth at bedtime. 07/29/20   Connye Burkitt, NP  hydrOXYzine (ATARAX/VISTARIL) 25 MG tablet Take 1 tablet (25 mg total) by mouth 3 (three) times daily as needed for anxiety. 07/29/20   Connye Burkitt, NP  lamoTRIgine (LAMICTAL) 100 MG tablet Take 1 tablet (100 mg total) by mouth at bedtime. 07/26/20   Money, Lowry Ram, FNP  Levonorgestrel (KYLEENA IU) by Intrauterine route. 02/21/18 02/22/23  [provider]  ofloxacin (FLOXIN) 0.3 % OTIC solution Place 10 drops into both ears daily for 7 days. 08/05/20 08/12/20  Tasia Catchings, Monda Chastain V, PA-C  pantoprazole (PROTONIX) 40 MG tablet Take 1 tablet (40 mg total) by mouth daily. 07/08/20   Noralyn Pick, NP  traZODone (DESYREL) 100 MG tablet Take 1 tablet (100 mg total) by mouth at bedtime. 07/26/20   Money, Lowry Ram, FNP    Family History Family History  Problem Relation Age of Onset  . Hepatitis B Mother   . Anxiety disorder Mother   . Hypertension Father   . ADD / ADHD Brother   . Ovarian cancer Paternal Grandmother     Social History Social History  Tobacco Use  . Smoking status: Never Smoker  . Smokeless tobacco: Never Used  Vaping Use  . Vaping Use: Former  Substance Use Topics  . Alcohol use: Yes    Comment: last drink on Tuesday,: pt had a glass of wine, drinks twice a month (5-7 drinks)  . Drug use: Yes    Types: Marijuana    Comment: use: every day, "2 tiny bowl fulls"     Allergies   Patient has no known allergies.   Review of Systems Review of Systems  Reason unable to perform ROS: See HPI as above.     Physical Exam Triage Vital Signs ED Triage Vitals [08/05/20 0810]  Enc Vitals Group     BP 106/73     Pulse Rate 86     Resp 18     Temp 98.1 F (36.7 C)     Temp Source Oral     SpO2 95 %     Weight      Height      Head Circumference      Peak Flow      Pain Score 6     Pain Loc      Pain Edu?      Excl. in [place]?    No data  found.  Updated Vital Signs BP 106/73 (BP Location: Left Arm)   Pulse 86   Temp 98.1 F (36.7 C) (Oral)   Resp 18   SpO2 95%   Physical Exam Constitutional:      General: She is not in acute distress.    Appearance: Normal appearance. She is well-developed. She is not toxic-appearing or diaphoretic.  HENT:     Head: Normocephalic and atraumatic.     Ears:     Comments: Bilateral tragus tender to palpation. Bilateral ear canal swelling with significant drainage, TM not visible. No tenderness to palpation of mastoid.  Eyes:     Conjunctiva/sclera: Conjunctivae normal.     Pupils: Pupils are equal, round, and reactive to light.  Pulmonary:     Effort: Pulmonary effort is normal. No respiratory distress.  Musculoskeletal:     Cervical back: Normal range of motion and neck supple.  Skin:    General: Skin is warm and dry.  Neurological:     Mental Status: She is alert and oriented to person, place, and time.      UC Treatments / Results  Labs (all labs ordered are listed, but only abnormal results are displayed) Labs Reviewed - No data to display  EKG   Radiology No results found.  Procedures Procedures (including critical care time)  Medications Ordered in UC Medications - No data to display  Initial Impression / Assessment and Plan / UC Course  I have reviewed the triage vital signs and the nursing notes.  Pertinent labs & imaging results that were available during my care of the patient were reviewed by me and considered in my medical decision making (see chart for details).    Ofloxacin for otitis externa. Given previously with otitis media, will also have patient start flonase for possible eustachian tube dysfunction although TM was unable to be visualized today. Return precautions given.  Final Clinical Impressions(s) / UC Diagnoses   Final diagnoses:  Infective otitis externa of both ears   ED Prescriptions    Medication Sig Dispense Auth. Provider    ofloxacin (FLOXIN) 0.3 % OTIC solution Place 10 drops into both ears daily for 7 days. 3.5 mL Tasia Catchings, Maire Govan  V, PA-C   fluticasone (FLONASE) 50 MCG/ACT nasal spray Place 2 sprays into both nostrils daily. 1 g Ok Edwards, PA-C     PDMP not reviewed this encounter.   Ok Edwards, PA-C 08/05/20 [id number]

## 2020-08-05 NOTE — ED Triage Notes (Signed)
Pt here with bilateral ear pain x 8 days; pt sts taking augmentin for same but no improvement

## 2020-08-05 NOTE — Discharge Instructions (Signed)
Start ofloxacin as directed. Also use flonase for possible eustachian tube dysfunction given previous inner ear infection. However, as discussed, was unable to see your ear drum due to swelling. If symptoms not improving, will need recheck of ear exam.

## 2020-08-08 ENCOUNTER — Ambulatory Visit (INDEPENDENT_AMBULATORY_CARE_PROVIDER_SITE_OTHER): Payer: No Typology Code available for payment source | Admitting: Licensed Clinical Social Worker

## 2020-08-08 ENCOUNTER — Other Ambulatory Visit: Payer: Self-pay

## 2020-08-08 DIAGNOSIS — F3181 Bipolar II disorder: Secondary | ICD-10-CM | POA: Diagnosis not present

## 2020-08-08 HISTORY — PX: ESOPHAGOGASTRODUODENOSCOPY: SHX1529

## 2020-08-08 HISTORY — PX: COLONOSCOPY: SHX174

## 2020-08-08 NOTE — Progress Notes (Signed)
Virtual Visit via Video Note  I connected with Victoria Simmons on 08/08/20 at  1:30 PM EDT by a video enabled telemedicine application and verified that I am speaking with the correct person using two identifiers.  Location: Patient: work Provider: [facility]   I discussed the limitations of evaluation and management by telemedicine and the availability of in person appointments. The patient expressed understanding and agreed to proceed.   I discussed the assessment and treatment plan with the patient. The patient was provided an opportunity to ask questions and all were answered. The patient agreed with the plan and demonstrated an understanding of the instructions.   The patient was advised to call back or seek an in-person evaluation if the symptoms worsen or if the condition fails to improve as anticipated.  I provided 30  minutes of non-face-to-face time during this encounter.   [name], LCSW    THERAPIST PROGRESS NOTE  Session Time: 1:30-2:00p  Participation Level: Active  Behavioral Response: Neat and Well GroomedAlertgenerally pleasant; improved  Type of Therapy: Individual Therapy  Treatment Goals addressed: Coping  Interventions: Supportive  Summary: Victoria Simmons is a 24 y.o. female who presents with improving symptoms related to her bipolar II diagnosis. Pt reports a decrease in depression symptoms and anxiety symptoms. Pt reports that she is compliant with medication and that she is getting fair quantity and quality of sleep. Pt continuing to wake up frequently at night.   Allowed pt to explore and express thoughts and feelings about recent hospitalization at [facility]. Pt feels it was a very positive experience and she is very thankful to have had the opportunity. Pt shared some coping skills she feels she acquired and is utilizing  in real-world situations.  Pt expressed significant stress associated with work environment--pt thinking about changing jobs.   Pt  reporting that things going well with family and family stress--pt is looking forward to family gatherings around the holidays. Reviewed importance of setting boundaries in any situation that may make pt feel uncomfortable.  Encouraged continuing self-care--pt reports that she is trying to be more intentional about self care and is trying to incorporate daily mindfulness into her routine.   Suicidal/Homicidal: No  SI, HI, or AVH reported at time of session.  Therapist Response: [name] reports a significant decrease in overall depression symptoms and no suicidal ideation, which is reflective of overall progress since last session. Pt reports increased motivation and initiative to participate in activities that make her feel better and happier.  Plan: Return again in 2 weeks.The ongoing treatment plan includes maintaining current levels of progress and continuing to build skills to manage mood, improve stress/anxiety management, emotion regulation, distress tolerance, and behavior modification.   Diagnosis: Axis I: Bipolar, Depressed    Axis II: No diagnosis    [name], LCSW 08/08/2020

## 2020-08-15 ENCOUNTER — Ambulatory Visit: Payer: No Typology Code available for payment source | Admitting: Family Medicine

## 2020-08-15 MED FILL — ARIPIPRAZOLE 10 MG TABS: 10 | 30 days supply | Qty: 30 | Fill #0

## 2020-08-24 ENCOUNTER — Encounter: Payer: Self-pay | Admitting: Gastroenterology

## 2020-08-25 ENCOUNTER — Ambulatory Visit (AMBULATORY_SURGERY_CENTER): Payer: No Typology Code available for payment source | Admitting: Gastroenterology

## 2020-08-25 ENCOUNTER — Other Ambulatory Visit: Payer: Self-pay

## 2020-08-25 ENCOUNTER — Encounter: Payer: Self-pay | Admitting: Gastroenterology

## 2020-08-25 ENCOUNTER — Other Ambulatory Visit: Payer: Self-pay | Admitting: Gastroenterology

## 2020-08-25 VITALS — BP 91/55 | HR 54 | Temp 96.8°F | Resp 13 | Ht 65.0 in | Wt 190.0 lb

## 2020-08-25 DIAGNOSIS — K920 Hematemesis: Secondary | ICD-10-CM

## 2020-08-25 DIAGNOSIS — K2970 Gastritis, unspecified, without bleeding: Secondary | ICD-10-CM | POA: Diagnosis not present

## 2020-08-25 DIAGNOSIS — D122 Benign neoplasm of ascending colon: Secondary | ICD-10-CM

## 2020-08-25 DIAGNOSIS — K319 Disease of stomach and duodenum, unspecified: Secondary | ICD-10-CM

## 2020-08-25 DIAGNOSIS — K2981 Duodenitis with bleeding: Secondary | ICD-10-CM

## 2020-08-25 DIAGNOSIS — K625 Hemorrhage of anus and rectum: Secondary | ICD-10-CM | POA: Diagnosis present

## 2020-08-25 DIAGNOSIS — R112 Nausea with vomiting, unspecified: Secondary | ICD-10-CM

## 2020-08-25 DIAGNOSIS — K635 Polyp of colon: Secondary | ICD-10-CM | POA: Diagnosis not present

## 2020-08-25 DIAGNOSIS — R194 Change in bowel habit: Secondary | ICD-10-CM

## 2020-08-25 DIAGNOSIS — K2980 Duodenitis without bleeding: Secondary | ICD-10-CM | POA: Diagnosis not present

## 2020-08-25 DIAGNOSIS — K2100 Gastro-esophageal reflux disease with esophagitis, without bleeding: Secondary | ICD-10-CM | POA: Diagnosis not present

## 2020-08-25 DIAGNOSIS — R195 Other fecal abnormalities: Secondary | ICD-10-CM

## 2020-08-25 DIAGNOSIS — K254 Chronic or unspecified gastric ulcer with hemorrhage: Secondary | ICD-10-CM | POA: Diagnosis not present

## 2020-08-25 MED ORDER — SODIUM CHLORIDE 0.9 % IV SOLN: 500.0000 mL | INTRAVENOUS | Status: DC

## 2020-08-25 MED ORDER — SUCRALFATE 1 G PO TABS: 1.0000 g | ORAL_TABLET | Freq: Four times a day (QID) | ORAL | 3 refills | Status: DC

## 2020-08-25 MED ORDER — PANTOPRAZOLE SODIUM 40 MG PO TBEC: 40.0000 mg | DELAYED_RELEASE_TABLET | Freq: Two times a day (BID) | ORAL | 3 refills | Status: DC

## 2020-08-25 MED FILL — SUCRALFATE 1 GM TABLET: 1 | 7 days supply | Qty: 30 | Fill #0

## 2020-08-25 MED FILL — PANTOPRAZOLE SOD DR 40 MG T: 40 | 30 days supply | Qty: 60 | Fill #0

## 2020-08-25 NOTE — Op Note (Signed)
[place] Patient Name: Victoria Simmons Procedure Date: 08/25/2020 2:00 PM MRN: [id number] Endoscopist: Remo Lipps P. Havery Moros , MD Age: 24 Referring MD:  Date of Birth: Jun 04, 1996 Gender: Female Account #: [id number] Procedure:                Colonoscopy Indications:              Rectal bleeding, Change in bowel habits - history                            of diarrhea with rectal bleeding previously, fecal                            calprotectin markedly elevated, GI pathogen panel                            negative, follow up CT negative. Patient reports                            diarrhea much improved, intermittent at this point Medicines:                Monitored Anesthesia Care Procedure:                Pre-Anesthesia Assessment:                           - Prior to the procedure, a History and Physical                            was performed, and patient medications and                            allergies were reviewed. The patient's tolerance of                            previous anesthesia was also reviewed. The risks                            and benefits of the procedure and the sedation                            options and risks were discussed with the patient.                            All questions were answered, and informed consent                            was obtained. Prior Anticoagulants: The patient has                            taken no previous anticoagulant or antiplatelet                            agents. ASA Grade Assessment: II - A patient with  mild systemic disease. After reviewing the risks                            and benefits, the patient was deemed in                            satisfactory condition to undergo the procedure.                           After obtaining informed consent, the colonoscope                            was passed under direct vision. Throughout the                             procedure, the patient's blood pressure, pulse, and                            oxygen saturations were monitored continuously. The                            Colonoscope was introduced through the anus and                            advanced to the the terminal ileum, with                            identification of the appendiceal orifice and IC                            valve. The colonoscopy was performed without                            difficulty. The patient tolerated the procedure                            well. The quality of the bowel preparation was                            good. The terminal ileum, ileocecal valve,                            appendiceal orifice, and rectum were photographed. Scope In: 2:22:10 PM Scope Out: 2:38:55 PM Scope Withdrawal Time: 0 hours 13 minutes 30 seconds  Total Procedure Duration: 0 hours 16 minutes 45 seconds  Findings:                 The perianal and digital rectal examinations were                            normal.                           The terminal ileum appeared normal.  A 4 mm polyp was found in the ascending colon. The                            polyp was flat. The polyp was removed with a cold                            snare. Resection and retrieval were complete.                           Internal hemorrhoids were found during                            retroflexion. The hemorrhoids were small.                           The exam was otherwise without abnormality.                           Biopsies for histology were taken with a cold                            forceps from the right colon, left colon and                            transverse colon for evaluation of microscopic                            colitis. Complications:            No immediate complications. Estimated blood loss:                            Minimal. Estimated Blood Loss:     Estimated blood loss was minimal. Impression:                - The examined portion of the ileum was normal.                           - One 4 mm polyp in the ascending colon, removed                            with a cold snare. Resected and retrieved.                           - Internal hemorrhoids.                           - The examination was otherwise normal.                           - Biopsies were taken with a cold forceps from the                            right colon, left colon and transverse colon for  evaluation of microscopic colitis.                           No evidence of IBD on this exam. Suspect rectal                            bleeding due to hemorrhoids in the setting of                            diarrhea which has since improved. Recommendation:           - Patient has a contact number available for                            emergencies. The signs and symptoms of potential                            delayed complications were discussed with the                            patient. Return to normal activities tomorrow.                            Written discharge instructions were provided to the                            patient.                           - Resume previous diet.                           - Continue present medications.                           - Await pathology results with further                            recommendations Remo Lipps P. Lauramae Kneisley, MD 08/25/2020 2:45:23 PM This report has been signed electronically.

## 2020-08-25 NOTE — Op Note (Signed)
[facility] Patient Name: Victoria Simmons Procedure Date: 08/25/2020 2:01 PM MRN: [id number] Endoscopist: Victoria Simmons , MD Age: 24 Referring MD:  Date of Birth: 1995/10/17 Gender: Female Account #: [id number] Procedure:                Upper GI endoscopy Indications:              intermittent postprandial epigastric abdominal                            pain, history of Hematemesis, postprandial nausea                            with vomiting. Somewhat improved on protonix - no                            further bleeding symptoms, nausea and vomiting                            persists Medicines:                Monitored Anesthesia Care Procedure:                Pre-Anesthesia Assessment:                           - Prior to the procedure, a History and Physical                            was performed, and patient medications and                            allergies were reviewed. The patient's tolerance of                            previous anesthesia was also reviewed. The risks                            and benefits of the procedure and the sedation                            options and risks were discussed with the patient.                            All questions were answered, and informed consent                            was obtained. Prior Anticoagulants: The patient has                            taken no previous anticoagulant or antiplatelet                            agents. ASA Grade Assessment: II - A patient with  mild systemic disease. After reviewing the risks                            and benefits, the patient was deemed in                            satisfactory condition to undergo the procedure.                           After obtaining informed consent, the endoscope was                            passed under direct vision. Throughout the                            procedure, the patient's blood pressure, pulse,  and                            oxygen saturations were monitored continuously. The                            Endoscope was introduced through the mouth, and                            advanced to the second part of duodenum. The upper                            GI endoscopy was accomplished without difficulty.                            The patient tolerated the procedure well. Scope In: Scope Out: Findings:                 Esophagogastric landmarks were identified: the                            Z-line was found at 35 cm, the gastroesophageal                            junction was found at 35 cm and the upper extent of                            the gastric folds was found at 35 cm from the                            incisors.                           LA Grade A esophagitis was found 35 cm from the                            incisors along with a few erosions in the distal  esophagus, likely due to vomiting..                           The exam of the esophagus was otherwise normal.                           Patchy severe inflammation characterized by                            adherent blood, erosions, erythema and friability                            was found in the gastric antrum.                           The exam of the stomach was otherwise normal.                           Biopsies were taken with a cold forceps in the                            gastric body, at the incisura and in the gastric                            antrum for Helicobacter pylori testing.                           The duodenal bulb and second portion of the                            duodenum were normal other than some mild erythema                            in the duodenal bulb. Biopsies for histology were                            taken with a cold forceps for evaluation of celiac                            disease. Complications:            No immediate complications.  Estimated blood loss:                            Minimal. Estimated Blood Loss:     Estimated blood loss was minimal. Impression:               - Esophagogastric landmarks identified.                           - LA Grade A reflux esophagitis.                           - Normal esophagus otherwise                           -  Antral gastritis.                           - Normal stomach otherwise - biopsies taken to rule                            out H pyloti                           - Mild erythema in the duodenal bulb                           - Normal duodenum otherwise. Biopsied. Recommendation:           - Patient has a contact number available for                            emergencies. The signs and symptoms of potential                            delayed complications were discussed with the                            patient. Return to normal activities tomorrow.                            Written discharge instructions were provided to the                            patient.                           - Resume previous diet.                           - Continue present medications.                           - Increase protonix to 40mg  twice daily                           - Add carafate 1gm po every 6 hours as needed for                            pain. #30 RF3                           - Await pathology results and course on regimen as                            above                           - If postprandial pain persist consider [name] to                            evaluate gallbladder, consider GES  Victoria Lipps P. Victoria Garcialopez, MD 08/25/2020 2:51:52 PM This report has been signed electronically.

## 2020-08-25 NOTE — Progress Notes (Signed)
Vs CW ° °

## 2020-08-25 NOTE — Progress Notes (Signed)
Called to room to assist during endoscopic procedure.  Patient ID and intended procedure confirmed with present staff. Received instructions for my participation in the procedure from the performing physician.  

## 2020-08-25 NOTE — Progress Notes (Signed)
Lidocaine buffer  robinol antisialogogue 

## 2020-08-25 NOTE — Patient Instructions (Signed)
HANDOUTS PROVIDED ON: polyps, gastritis and hemorrhoids  The polyps removed/biopsies taken today have been sent for pathology.  The results can take 1-3 weeks to receive.  When your next colonoscopy should occur will be based on the pathology results.    You may resume your previous diet and medication schedule. Increase Protonix to 40mg  twice daily by mouth.  Add carafate 1 gm by mouth every 6 hours as needed for pain.  If post eating pain persist consider ultrasound to evaluate gallbladder, consider Gastric Emptying Study   Thank you for allowing [name] to care for you today!!!   YOU HAD AN ENDOSCOPIC PROCEDURE TODAY AT [place] [facility]:   Refer to the procedure report that was given to you for any specific questions about what was found during the examination.  If the procedure report does not answer your questions, please call your gastroenterologist to clarify.  If you requested that your care partner not be given the details of your procedure findings, then the procedure report has been included in a sealed envelope for you to review at your convenience later.  YOU SHOULD EXPECT: Some feelings of bloating in the abdomen. Passage of more gas than usual.  Walking can help get rid of the air that was put into your GI tract during the procedure and reduce the bloating. If you had a lower endoscopy (such as a colonoscopy or flexible sigmoidoscopy) you may notice spotting of blood in your stool or on the toilet paper. If you underwent a bowel prep for your procedure, you may not have a normal bowel movement for a few days.  Please Note:  You might notice some irritation and congestion in your nose or some drainage.  This is from the oxygen used during your procedure.  There is no need for concern and it should clear up in a day or so.  SYMPTOMS TO REPORT IMMEDIATELY:   Following lower endoscopy (colonoscopy or flexible sigmoidoscopy):  Excessive amounts of blood in the stool  Significant  tenderness or worsening of abdominal pains  Swelling of the abdomen that is new, acute  Fever of 100F or higher   Following upper endoscopy (EGD)  Vomiting of blood or coffee ground material  New chest pain or pain under the shoulder blades  Painful or persistently difficult swallowing  New shortness of breath  Fever of 100F or higher  Black, tarry-looking stools  For urgent or emergent issues, a gastroenterologist can be reached at any hour by calling [phone]. Do not use MyChart messaging for urgent concerns.    DIET:  We do recommend a small meal at first, but then you may proceed to your regular diet.  Drink plenty of fluids but you should avoid alcoholic beverages for 24 hours.  ACTIVITY:  You should plan to take it easy for the rest of today and you should NOT DRIVE or use heavy machinery until tomorrow (because of the sedation medicines used during the test).    FOLLOW UP: Our staff will call the number listed on your records 48-72 hours following your procedure to check on you and address any questions or concerns that you may have regarding the information given to you following your procedure. If we do not reach you, we will leave a message.  We will attempt to reach you two times.  During this call, we will ask if you have developed any symptoms of COVID 19. If you develop any symptoms (ie: fever, flu-like symptoms, shortness of  breath, cough etc.) before then, please call [phone].  If you test positive for Covid 19 in the 2 weeks post procedure, please call and report this information to [name].    If any biopsies were taken you will be contacted by phone or by letter within the next 1-3 weeks.  Please call us at [phone] if you have not heard about the biopsies in 3 weeks.    SIGNATURES/CONFIDENTIALITY: You and/or your care partner have signed paperwork which will be entered into your electronic medical record.  These signatures attest to the fact that that  the information above on your After Visit Summary has been reviewed and is understood.  Full responsibility of the confidentiality of this discharge information lies with you and/or your care-partner.

## 2020-08-25 NOTE — Progress Notes (Signed)
A and O x3. Report to RN. Tolerated MAC anesthesia well.Teeth unchanged after procedure.

## 2020-08-26 ENCOUNTER — Ambulatory Visit (INDEPENDENT_AMBULATORY_CARE_PROVIDER_SITE_OTHER): Payer: No Typology Code available for payment source | Admitting: Licensed Clinical Social Worker

## 2020-08-26 DIAGNOSIS — F3181 Bipolar II disorder: Secondary | ICD-10-CM

## 2020-08-26 DIAGNOSIS — F411 Generalized anxiety disorder: Secondary | ICD-10-CM

## 2020-08-26 NOTE — Progress Notes (Addendum)
Virtual Visit via Video Note  I connected with Victoria Simmons on 08/26/20 at 11:00 AM EST by a video enabled telemedicine application and verified that I am speaking with the correct person using two identifiers.  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 The patient expressed understanding and agreed to proceed.   I discussed the assessment and treatment plan with the patient. The patient was provided an opportunity to ask questions and all were answered. The patient agreed with the plan and demonstrated an understanding of the instructions.   The patient was advised to call back or seek an in-person evaluation if the symptoms worsen or if the condition fails to improve as anticipated.  I provided 30 minutes of non-face-to-face time during this encounter.   Jermani Pund R Javonnie Illescas, LCSW   THERAPIST PROGRESS NOTE  Session Time: 11:00-11:32a  Participation Level: Active  Behavioral Response: Neat and Well GroomedAlertAnxious  Type of Therapy: Individual Therapy  Treatment Goals addressed: Anxiety and Coping  Interventions: CBT, Solution Focused and Supportive  Summary: Victoria Simmons is a 24 y.o. female who presents with improving symptoms related to bipolar disorder and generalized anxiety disorder diagnoses. Pt reports that she feels her mood is stable and that she is managing her stress and anxiety better. Pt reports that medication is managing symptoms well and that she is compliant with medications.   Allowed pt to explore and express thoughts and feelings associated with anxiety triggers. 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 Pt reports that she is currently looking for a new job. Pts brother works at [employer], so pt  feels this could be a fit for her, as well.   Pt reports that she has been feeling very good and is happy about a new relationship that has been going on for 3 months. Pt looking forward to sharing time with new partner. Discussed recent trip to [place] to spend time with aunt--pt enjoys spending time with her. Discussed pts love for travel and pt spending time with family in [place] for thanksgiving. Pt reports that she feels more motivated when she has a trip planned.  Discussed personal goals for pt: getting new job, losing weight. Discussed steps that pt will need to take to start on this journey and discussed potential obstacles--and interventions to overcome obstacles.   Encouraged pt to continue focusing on self care and life balance. Encouraged continuing medication compliance.   Suicidal/Homicidal: No  SI, HI, or AVH reported at time of session.  Therapist Response: Victoria Simmons reports that she feels she is making good progress with mood regulation, emotion regulation, and life management. Pt reports a decrease in anxiety symptoms and more stable mood, which is indicative of progress. Treatment showing good evolution and development.  Plan: Return again in 3 weeks. The ongoing treatment plan includes maintaining current levels of progress and continuing to build skills to manage mood, improve stress/anxiety management, emotion regulation, distress tolerance, and behavior modification.   Diagnosis: Axis I: Bipolar, Depressed and Generalized Anxiety Disorder    Axis II: No diagnosis    Rachel Bo Jassmin Kemmerer, LCSW 08/26/2020

## 2020-08-29 ENCOUNTER — Telehealth: Payer: Self-pay

## 2020-08-29 ENCOUNTER — Telehealth: Payer: Self-pay | Admitting: *Deleted

## 2020-08-29 NOTE — Telephone Encounter (Signed)
  Follow up Call-  Call back number 08/25/2020  Post procedure Call Back phone  # [phone]  Permission to leave phone message Yes  Some recent data might be hidden     Patient questions:  Message left to call us if necessary.

## 2020-08-29 NOTE — Telephone Encounter (Signed)
  Follow up Call-  Call back number 08/25/2020  Post procedure Call Back phone  # [phone]  Permission to leave phone message Yes  Some recent data might be hidden     Patient questions:  Do you have a fever, pain , or abdominal swelling? No. Pain Score  0 *  Have you tolerated food without any problems? Yes.    Have you been able to return to your normal activities? Yes.    Do you have any questions about your discharge instructions: Diet   No. Medications  No. Follow up visit  No.  Do you have questions or concerns about your Care? No.  Actions: * If pain score is 4 or above: No action needed, pain <4.  1. Have you developed a fever since your procedure? no  2.   Have you had an respiratory symptoms (SOB or cough) since your procedure? no  3.   Have you tested positive for COVID 19 since your procedure no  4.   Have you had any family members/close contacts diagnosed with the COVID 19 since your procedure?  no   If yes to any of these questions please route to Joylene John, RN and Joella Prince, RN

## 2020-09-05 MED FILL — FLUVOXAMINE MALEATE 50 MG T: 50 | 30 days supply | Qty: 90 | Fill #0

## 2020-09-05 MED FILL — lamoTRIgine 100 MG TABS: 100 | 30 days supply | Qty: 60 | Fill #2

## 2020-09-05 MED FILL — traZODone HCL 100 MG TABS: 100 | 30 days supply | Qty: 45 | Fill #2

## 2020-09-08 MED FILL — ARIPIPRAZOLE 10 MG TABS: 10 | 30 days supply | Qty: 30 | Fill #2

## 2020-09-09 ENCOUNTER — Other Ambulatory Visit: Payer: Self-pay

## 2020-09-09 ENCOUNTER — Telehealth (INDEPENDENT_AMBULATORY_CARE_PROVIDER_SITE_OTHER): Payer: No Typology Code available for payment source | Admitting: Child and Adolescent Psychiatry

## 2020-09-09 ENCOUNTER — Other Ambulatory Visit: Payer: Self-pay | Admitting: Child and Adolescent Psychiatry

## 2020-09-09 DIAGNOSIS — F411 Generalized anxiety disorder: Secondary | ICD-10-CM | POA: Diagnosis not present

## 2020-09-09 DIAGNOSIS — F3181 Bipolar II disorder: Secondary | ICD-10-CM

## 2020-09-09 MED ORDER — HYDROXYZINE HCL 25 MG PO TABS: 25.0000 mg | ORAL_TABLET | Freq: Three times a day (TID) | ORAL | 0 refills | Status: DC | PRN

## 2020-09-09 MED ORDER — TRAZODONE HCL 100 MG PO TABS: 100.0000 mg | ORAL_TABLET | Freq: Every day | ORAL | 1 refills | Status: DC

## 2020-09-09 MED ORDER — ARIPIPRAZOLE 10 MG PO TABS: 10.0000 mg | ORAL_TABLET | Freq: Every day | ORAL | 1 refills | Status: DC

## 2020-09-09 MED ORDER — LAMOTRIGINE 100 MG PO TABS: 150.0000 mg | ORAL_TABLET | Freq: Every day | ORAL | 1 refills | Status: DC

## 2020-09-09 MED ORDER — FLUVOXAMINE MALEATE 50 MG PO TABS: 150.0000 mg | ORAL_TABLET | Freq: Every day | ORAL | 1 refills | Status: DC

## 2020-09-09 MED FILL — hydrOXYzine HCL 25 MG TABS: 25 | 25 days supply | Qty: 75 | Fill #0

## 2020-09-09 NOTE — Progress Notes (Signed)
Virtual Visit via Video Note  I connected with Victoria Simmons on 09/09/20 at 10:00 AM EST by a video enabled telemedicine application and verified that I am speaking with the correct person using two identifiers.  Location: Patient: [place] Provider: Office   I discussed the limitations of evaluation and management by telemedicine and the availability of in person appointments. The patient expressed understanding and agreed to proceed.  BH MD/PA/NP OP Progress Note  09/09/2020 11:06 AM Victoria Simmons  MRN:  [id number]  Chief Complaint: Medication management follow-up for bipolar disorder, GAD, insomnia.  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 Einar Grad since 2017 and transition to this writer in April 2020.  She is currently prescribed Lamictal 150 mg once a day, Abilify 10 mg once a day, trazodone 150 mg once a day and Luvox 150 mg once a day.   HPI:   Patient was seen and evaluated over telemedicine encounter for medication management follow-up.  In the interim since last appointment no acute medical events reported.  Victoria Simmons reports that she has been doing well as compared to last appointment and reports improvement with her mood and anxiety.  She reports that her work is been going much better as compared to last appointment and that has reduced stress for her.  She reports that her mood has been stable, denies any low lows, denies anhedonia, problems with sleep or appetite.  She reports that she has been working on eating healthy and also going to gym for workouts.  She reports that her anxiety has been out of 4-5 out of 10(10 = most anxious).  She denies any suicidal thoughts, self-harm thoughts or behaviors, denies AVH.  She reports that she has been adherent to her medications and denies any problems with that.  She reports that she continues to see her therapist and that has been going  well.  We discussed to continue with her current medications and follow-up in 6 weeks or earlier if needed  Visit Diagnosis:    ICD-10-CM   1. Bipolar 2 disorder, major depressive episode (HCC)  F31.81 lamoTRIgine (LAMICTAL) 100 MG tablet    ARIPiprazole (ABILIFY) 10 MG tablet  2. GAD (generalized anxiety disorder)  F41.1 fluvoxaMINE (LUVOX) 50 MG tablet    Past Psychiatric History: PPHx reviewed today and as followin. 1 previous inpatient admission at [facility] at the age of 73, no previous suicide attempts, has history of cutting and her past medication trials include Prozac and Zoloft.  Decreasing Luvox to 50 mg worsened her symptoms of anxiety therefore it was increased back to 100 mg once a day.   Previous trials of Zoloft 150 mg and Prozac 10 mg stopped due to inefficacy.   Past Medical History:  Past Medical History:  Diagnosis Date  . Anxiety   . Bipolar 2 disorder ([place])   . Depression   . Dysmenorrhea   . Hypertriglyceridemia 2020  . MDD (major depressive disorder) 06/19/2017  . Ovarian torsion 2008    Past Surgical History:  Procedure Laterality Date  . OVARY SURGERY  06/30/2007   dx laparoscopy, reduction of right adnexal torsion, bilateral oophoropexy of each ovary to posterior uterine fundus    Family Psychiatric History: As mentioned in initial H&P, reviewed today, no change Family History:  Family History  Problem Relation Age of Onset  . Hepatitis B Mother   . Anxiety disorder Mother   .  Hypertension Father   . ADD / ADHD Brother   . Ovarian cancer Paternal Grandmother     Social History:  Social History   Socioeconomic History  . Marital status: Single    Spouse name: Not on file  . Number of children: 0  . Years of education: Not on file  . Highest education level: Some college, no degree  Occupational History  . Occupation: [employer]    Comment: full time  Tobacco Use  . Smoking status: Never Smoker  . Smokeless tobacco: Never Used  Vaping  Use  . Vaping Use: Former  Substance and Sexual Activity  . Alcohol use: Yes    Comment: last drink on Tuesday,: pt had a glass of wine, drinks twice a month (5-7 drinks)  . Drug use: Yes    Types: Marijuana    Comment: last use 16 Aug 2020  . Sexual activity: Not Currently  Other Topics Concern  . Not on file  Social History Narrative          Social Determinants of Health   Financial Resource Strain:   . Difficulty of Paying Living Expenses: Not on file  Food Insecurity:   . Worried About Charity fundraiser in the Last Year: Not on file  . Ran Out of Food in the Last Year: Not on file  Transportation Needs:   . Lack of Transportation (Medical): Not on file  . Lack of Transportation (Non-Medical): Not on file  Physical Activity:   . Days of Exercise per Week: Not on file  . Minutes of Exercise per Session: Not on file  Stress:   . Feeling of Stress : Not on file  Social Connections:   . Frequency of Communication with Friends and Family: Not on file  . Frequency of Social Gatherings with Friends and Family: Not on file  . Attends Religious Services: Not on file  . Active Member of Clubs or Organizations: Not on file  . Attends Archivist Meetings: Not on file  . Marital Status: Not on file    Allergies: No Known Allergies  Metabolic Disorder Labs: Lab Results  Component Value Date   HGBA1C 5.0 07/26/2020   MPG 97 07/26/2020   MPG 94 05/14/2017   No results found for: PROLACTIN Lab Results  Component Value Date   CHOL 226 (H) 07/26/2020   TRIG 138 07/26/2020   HDL 44 07/26/2020   CHOLHDL 5.1 07/26/2020   VLDL 28 07/26/2020   LDLCALC 154 (H) 07/26/2020   LDLCALC 103 (H) 04/05/2020   Lab Results  Component Value Date   TSH 2.401 07/26/2020   TSH 2.64 04/05/2020    Therapeutic Level Labs: No results found for: LITHIUM No results found for: VALPROATE No components found for:  CBMZ  Current Medications: Current Outpatient Medications   Medication Sig Dispense Refill  . ARIPiprazole (ABILIFY) 10 MG tablet Take 1 tablet (10 mg total) by mouth at bedtime. 30 tablet 1  . fluticasone (FLONASE) 50 MCG/ACT nasal spray Place 2 sprays into both nostrils daily. (Patient not taking: Reported on 08/25/2020) 1 g 0  . fluvoxaMINE (LUVOX) 50 MG tablet Take 3 tablets (150 mg total) by mouth at bedtime. 90 tablet 1  . hydrOXYzine (ATARAX/VISTARIL) 25 MG tablet Take 1 tablet (25 mg total) by mouth 3 (three) times daily as needed for anxiety. 75 tablet 0  . lamoTRIgine (LAMICTAL) 100 MG tablet Take 1.5 tablets (150 mg total) by mouth at bedtime. 45 tablet  1  . Levonorgestrel (KYLEENA IU) by Intrauterine route.    . pantoprazole (PROTONIX) 40 MG tablet Take 1 tablet (40 mg total) by mouth 2 (two) times daily. 60 tablet 3  . sucralfate (CARAFATE) 1 g tablet Take 1 tablet (1 g total) by mouth every 6 (six) hours. 30 tablet 3  . traZODone (DESYREL) 100 MG tablet Take 1 tablet (100 mg total) by mouth at bedtime. 30 tablet 1   No current facility-administered medications for this visit.     Musculoskeletal: Strength & Muscle Tone: unable to assess since visit was over the telemedicine. Gait & Station: unable to assess since visit was over the telemedicine. Patient leans: N/A  Psychiatric Specialty Exam: ROSReview of 12 systems negative except as mentioned in HPI   There were no vitals taken for this visit.There is no height or weight on file to calculate BMI.  General Appearance: Casual  Eye Contact:  Good  Speech:  Clear and Coherent and Normal Rate  Volume:  Normal  Mood: "good..."  Affect:  Appropriate, Congruent and Full Range  Thought Process:  Goal Directed and Linear  Orientation:  Full (Time, Place, and Person)  Thought Content: Logical   Suicidal Thoughts:  No  Homicidal Thoughts:  No  Memory:  Immediate;   Fair Recent;   Fair Remote;   Fair  Judgement:  Good  Insight:  Good  Psychomotor Activity:  Normal  Concentration:   Concentration: Good and Attention Span: Good  Recall:  Good  Fund of Knowledge: Good  Language: Good  Akathisia:  No    AIMS (if indicated): not done  Assets:  Communication Skills Desire for Improvement Financial Resources/Insurance Housing Leisure Time Physical Health Social Support Transportation Vocational/Educational  ADL's:  Intact  Cognition: WNL  Sleep:  Good     Screenings: AIMS     Admission (Discharged) from 05/13/2017 in [place] Total Score 0    AUDIT     Admission (Discharged) from 07/27/2020 in [place] [facility] Admission (Discharged) from 05/13/2017 in [place]  Alcohol Use Disorder Identification Test Final Score (AUDIT) 6 0    GAD-7     Office Visit from 04/05/2020 in [place] at [facility]  Total GAD-7 Score 7    PHQ2-9     Counselor from 07/26/2020 in [place] from 06/07/2020 in [place] Visit from 04/05/2020 in [place] at [facility] Visit from 06/19/2017 in [place] at [place]  PHQ-2 Total Score 5 0 1 0  PHQ-9 Total Score 22 7 7 4        Assessment and Plan:   Axel is a 24 yr old Caucasian female who is single, employed, has a history of bipolar disorder, generalized anxiety disorder, insomnia evaluated today for a follow-up visit for medication management. She reports continued improvement in mood and anxiety since the last appointment and no suicidal thoughts.  She does not appear depressed, manic/hypomanic or psychotic. Has been using MJA and intermittently drinks alcohol. Reviewed response to current medication and recommneding medications as below. Plan as below.    Plan  Bipolar 2 disorder (chronic, improving) Continue with lamotrigine  150 mg daily.  Continue withLuvox 150 mg p.o. daily Continue Abilify at10mg  po qd.  For  generalized anxiety disorder-(chronic and stable) -  ind therapy at [facility],  Luvox as mentioned above.  Medications above will be helpful in decreasing anxiety. Previously tried  Prozac upto 10 mg daily and Zoloft 150 mg daily - stopped due to inefficacy.   For insomnia-chronic and stable Continue Trazodone at150mg  p.o. nightly       Follow Up Instructions:    I discussed the assessment and treatment plan with the patient. The patient was provided an opportunity to ask questions and all were answered. The patient agreed with the plan and demonstrated an understanding of the instructions.   The patient was advised to call back or seek an in-person evaluation if the symptoms worsen or if the condition fails to improve as anticipated   Orlene Erm, MD 09/09/2020, 11:06 AM

## 2020-09-15 ENCOUNTER — Ambulatory Visit (INDEPENDENT_AMBULATORY_CARE_PROVIDER_SITE_OTHER): Payer: No Typology Code available for payment source | Admitting: Licensed Clinical Social Worker

## 2020-09-15 ENCOUNTER — Other Ambulatory Visit: Payer: Self-pay

## 2020-09-15 DIAGNOSIS — F3181 Bipolar II disorder: Secondary | ICD-10-CM

## 2020-09-15 DIAGNOSIS — F411 Generalized anxiety disorder: Secondary | ICD-10-CM | POA: Diagnosis not present

## 2020-09-15 NOTE — Progress Notes (Signed)
Virtual Visit via Video Note  I connected with Victoria Simmons on 09/15/20 at  9:00 AM EST by a video enabled telemedicine application and verified that I am speaking with the correct person using two identifiers.  Video connection was lost when less than 50% of the duration of the visit was complete, at which time the remainder of the visit was completed via audio only.   Location: Patient: home Provider: [facility]   I discussed the limitations of evaluation and management by telemedicine and the availability of in person appointments. The patient expressed understanding and agreed to proceed.   The patient was advised to call back or seek an in-person evaluation if the symptoms worsen or if the condition fails to improve as anticipated.  I provided 30 minutes of non-face-to-face time during this encounter.   Carlas Vandyne R Sammie Denner, LCSW    THERAPIST PROGRESS NOTE  Session Time: 9-9:30a  Participation Level: Active  Behavioral Response: NeatAlertAnxious and generally pleasant and positive; good engagement throughout session  Type of Therapy: Individual Therapy  Treatment Goals addressed: Coping  Interventions: Supportive  Summary: [name] is a 24 y.o. female who presents with improving symptoms consistent with bipolar and anxiety diagnosis. Pt reports that mood is stable and that she feels more in-control with managing stress and anxiety. Pt attitude at time of session is cooperative, warm, and friendly.  Allowed pt to share thoughts and feelings about current life situations--pt is very excited about "official" new relationship with girlfriend. Pt has made some travel plans for the holidays and trip in January.  Pt reports that she is being very intentional about self care and feeling better. Pt is working out regularly, trying to get good rest, compliant with medication, and is journaling/reflecting. Pt feels overall life quality is better--even enjoying work  more.  Encouraged pt to continue with behaviors that are contributing factors to current levels of progress.  Suicidal/Homicidal: No  SI, HI, or AVH reported at time of session.  Therapist Response: Victoria Simmons continues to maintain progress that has been gained recently. Victoria Simmons is continuing to develop her capacity for self care, life management, family relational functioning, and occupational engagement and satisfaction. Treatment is showing good evolution and development.   Plan: Return again in 3 weeks.  Diagnosis: Axis I: Bipolar, Depressed and Generalized Anxiety Disorder    Axis II: No diagnosis    Rachel Bo Sha Burling, LCSW 09/15/2020

## 2020-09-29 MED FILL — FLUVOXAMINE MALEATE 50 MG T: 50 | 30 days supply | Qty: 90 | Fill #0

## 2020-09-29 MED FILL — traZODone HCL 100 MG TABS: 100 | 30 days supply | Qty: 30 | Fill #0

## 2020-09-29 MED FILL — lamoTRIgine 100 MG TABS: 100 | 30 days supply | Qty: 45 | Fill #0

## 2020-10-03 MED FILL — ARIPIPRAZOLE 10 MG TABS: 10 | 30 days supply | Qty: 30 | Fill #0

## 2020-10-11 ENCOUNTER — Other Ambulatory Visit: Payer: Self-pay

## 2020-10-11 ENCOUNTER — Ambulatory Visit (INDEPENDENT_AMBULATORY_CARE_PROVIDER_SITE_OTHER): Payer: No Typology Code available for payment source | Admitting: Licensed Clinical Social Worker

## 2020-10-11 DIAGNOSIS — F411 Generalized anxiety disorder: Secondary | ICD-10-CM

## 2020-10-11 DIAGNOSIS — F3181 Bipolar II disorder: Secondary | ICD-10-CM

## 2020-10-11 NOTE — Progress Notes (Signed)
Virtual Visit via Telephone Simmons  I connected with Victoria Simmons on 10/11/20 at  9:00 AM EST by telephone and verified that I am speaking with the correct person using two identifiers.  Location: Patient: home Provider: [facility]   I discussed the limitations, risks, security and privacy concerns of performing an evaluation and management service by telephone and the availability of in person appointments. I also discussed with the patient that there may be a patient responsible charge related to this service. The patient expressed understanding and agreed to proceed.  The patient was advised to call back or seek an in-person evaluation if the symptoms worsen or if the condition fails to improve as anticipated.  I provided 40 minutes of non-face-to-face time during this encounter.   Victoria Samson R Donata Reddick, LCSW   Victoria Simmons  Session Time: 9-9:40a  Participation Level: Active  Behavioral Response: NAAlertAnxious  Type of Therapy: Individual Therapy  Treatment Goals addressed: Anxiety and Coping  Interventions: Solution Focused and Supportive  Summary: Victoria Simmons is a 25 y.o. female who presents with symptoms consistent with bipolar disorder and anxiety Pt reports that mood has been stable and that she is managing stress and anxiety better. Pt reports good quality and quantity of sleep.  Allowed pt to explore and express thoughts and feelings about holiday activities, recent loss of romantic relationship, identification of anxiety triggers.  Daksha enjoyed spending holiday time with family and friends--had some of dads relatives from [facility] visit.  Pt also spent time on NYE with friends around a bonfire (and spent time with family on [place] day). Pt reports that she feels good when she spends time with friends and family, so she is being intentional about spending more time with them.  Discussed current relationship issues--pt reports a recent break-up with girlfriend. "we just  didn't communicate well".  Allowed pt to express her thoughts and concerns about communication issues and brainstormed different communication tools. Discussed pt expectations and personal boundaries.   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 "I don't want to just work my day away". Allowed pt to explore barriers to the job search/application process and assisted pt in discovering ways that she can break down those barriers and apply to more jobs.  Encouraged pt to continue focusing on self care, positive social interactions, and life balance.   Suicidal/Homicidal: No. Pt reported some fleeting thoughts one day (SI) with no plans or intent. Reviewed "red flags" and when to contact local crisis resources. Pt reflected understanding.  Victoria Response: [name] is continuing to use coping skills to manage anxiety and panic symptoms. [name] feels that the intensity, frequency, and duration of anxiety symptoms have decreased. Pt feels more confident in the ways that she is managing overall mood and anxiety. Pt is feeling more in control of bigger life decisions.   Plan: Return again in 3 weeks.  Diagnosis: Axis I: Bipolar, Depressed and Generalized Anxiety Disorder    Axis II: No diagnosis    Ernest Haber Sarah Baez, LCSW 10/11/2020

## 2020-10-14 ENCOUNTER — Ambulatory Visit: Payer: No Typology Code available for payment source | Admitting: Licensed Clinical Social Worker

## 2020-10-14 ENCOUNTER — Other Ambulatory Visit: Payer: Self-pay

## 2020-10-21 ENCOUNTER — Other Ambulatory Visit: Payer: Self-pay

## 2020-10-21 ENCOUNTER — Telehealth (INDEPENDENT_AMBULATORY_CARE_PROVIDER_SITE_OTHER): Payer: No Typology Code available for payment source | Admitting: Child and Adolescent Psychiatry

## 2020-10-21 ENCOUNTER — Other Ambulatory Visit: Payer: Self-pay | Admitting: Child and Adolescent Psychiatry

## 2020-10-21 DIAGNOSIS — F3181 Bipolar II disorder: Secondary | ICD-10-CM

## 2020-10-21 DIAGNOSIS — F411 Generalized anxiety disorder: Secondary | ICD-10-CM | POA: Diagnosis not present

## 2020-10-21 MED ORDER — LAMOTRIGINE 100 MG PO TABS: 150.0000 mg | ORAL_TABLET | Freq: Every day | ORAL | 1 refills | Status: DC

## 2020-10-21 MED ORDER — FLUVOXAMINE MALEATE 50 MG PO TABS: 150.0000 mg | ORAL_TABLET | Freq: Every day | ORAL | 1 refills | Status: DC

## 2020-10-21 MED ORDER — ARIPIPRAZOLE 10 MG PO TABS: 10.0000 mg | ORAL_TABLET | Freq: Every day | ORAL | 1 refills | Status: DC

## 2020-10-21 MED ORDER — TRAZODONE HCL 100 MG PO TABS: 100.0000 mg | ORAL_TABLET | Freq: Every day | ORAL | 1 refills | Status: DC

## 2020-10-21 NOTE — Progress Notes (Signed)
Virtual Visit via Telephone Note  I connected with Victoria Simmons on 10/21/20 at 10:00 AM EST by telephone and verified that I am speaking with the correct person using two identifiers.  Location: Patient: home Provider: office   I discussed the limitations, risks, security and privacy concerns of performing an evaluation and management service by telephone and the availability of in person appointments. I also discussed with the patient that there may be a patient responsible charge related to this service. The patient expressed understanding and agreed to proceed.     I discussed the assessment and treatment plan with the patient. The patient was provided an opportunity to ask questions and all were answered. The patient agreed with the plan and demonstrated an understanding of the instructions.   The patient was advised to call back or seek an in-person evaluation if the symptoms worsen or if the condition fails to improve as anticipated.  I provided 46minutes of non-face-to-face time during this encounter.   Orlene Erm, MD   [facility] MD/PA/NP OP Progress Note  10/21/2020 10:21 AM Victoria Simmons  MRN:  [id number]  Chief Complaint: Medication management follow-up for bipolar disorder, generalized anxiety disorder and insomnia.  Synopsis: Victoria Simmons is a 25 year old Caucasian female, employed at daycare, with history of bipolar disorder type II, GAD, insomnia was seen by Dr. Einar Grad since 2017 and transition to this writer in April 2020.  She is currently prescribed Lamictal 150 mg once a day, Abilify 10 mg once a day, trazodone 150 mg once a day and Luvox 150 mg once a day.   HPI:   Patient was scheduled to see this Probation officer on telemedicine encounter however due to epic MyChart and connectivity issues it was switched to telephone.  Victoria Simmons denies any concerns for today's appointment and reports that she has continued to do very well.  She reports that her mood has been good, denies any ups  or downs or low lows, work has been going well and it has not been as stressful, reports some anxiety related to work but able to manage well, in her past time she has been spending time reading/listening to music/hanging out with her friends.  She reports that she continues to live with her parents and doing well with her home life.  She denies any problems with sleep or appetite.  She reports that she has cut down on her marijuana use and she last used about 1 week ago as compared to previous that she used marijuana on a daily basis.  She also reports that she has not been drinking and does not remember when she drank last time.  She denies any other substance abuse.  She denies any SI/HI.  She reports that she has been compliant with her medications and denies any problems.  She reports that she continues to see her therapist on a regular basis.  We discussed to continue with her current medications and follow-up in 2 months or earlier if needed.  She verbalized understanding and agreed with the plan.  Visit Diagnosis:    ICD-10-CM   1. GAD (generalized anxiety disorder)  F41.1 fluvoxaMINE (LUVOX) 50 MG tablet  2. Bipolar 2 disorder, major depressive episode (HCC)  F31.81 ARIPiprazole (ABILIFY) 10 MG tablet    lamoTRIgine (LAMICTAL) 100 MG tablet    Past Psychiatric History: PPHx reviewed today and as followin. 2 previous inpatient admission at [facility] at the age of 48, and last at [facility], no previous suicide attempts, has history of  cutting and her past medication trials include Prozac and Zoloft.  Decreasing Luvox to 50 mg worsened her symptoms of anxiety therefore it was increased back to 100 mg once a day.   Previous trials of Zoloft 150 mg and Prozac 10 mg stopped due to inefficacy.   Past Medical History:  Past Medical History:  Diagnosis Date  . Anxiety   . Bipolar 2 disorder ([place])   . Depression   . Dysmenorrhea   . Hypertriglyceridemia 2020  . MDD (major depressive disorder) 06/19/2017  .  Ovarian torsion 2008    Past Surgical History:  Procedure Laterality Date  . OVARY SURGERY  06/30/2007   dx laparoscopy, reduction of right adnexal torsion, bilateral oophoropexy of each ovary to posterior uterine fundus    Family Psychiatric History: As mentioned in initial H&P, reviewed today, no change Family History:  Family History  Problem Relation Age of Onset  . Hepatitis B Mother   . Anxiety disorder Mother   . Hypertension Father   . ADD / ADHD Brother   . Ovarian cancer Paternal Grandmother     Social History:  Social History   Socioeconomic History  . Marital status: Single    Spouse name: Not on file  . Number of children: 0  . Years of education: Not on file  . Highest education level: Some college, no degree  Occupational History  . Occupation: [employer]    Comment: full time  Tobacco Use  . Smoking status: Never Smoker  . Smokeless tobacco: Never Used  Vaping Use  . Vaping Use: Former  Substance and Sexual Activity  . Alcohol use: Yes    Comment: last drink on Tuesday,: pt had a glass of wine, drinks twice a month (5-7 drinks)  . Drug use: Yes    Types: Marijuana    Comment: last use 16 Aug 2020  . Sexual activity: Not Currently  Other Topics Concern  . Not on file  Social History Narrative          Social Determinants of Health   Financial Resource Strain: Not on file  Food Insecurity: Not on file  Transportation Needs: Not on file  Physical Activity: Not on file  Stress: Not on file  Social Connections: Not on file    Allergies: No Known Allergies  Metabolic Disorder Labs: Lab Results  Component Value Date   HGBA1C 5.0 07/26/2020   MPG 97 07/26/2020   MPG 94 05/14/2017   No results found for: PROLACTIN Lab Results  Component Value Date   CHOL 226 (H) 07/26/2020   TRIG 138 07/26/2020   HDL 44 07/26/2020   CHOLHDL 5.1 07/26/2020   VLDL 28 07/26/2020   LDLCALC 154 (H) 07/26/2020   LDLCALC 103 (H) 04/05/2020   Lab  Results  Component Value Date   TSH 2.401 07/26/2020   TSH 2.64 04/05/2020    Therapeutic Level Labs: No results found for: LITHIUM No results found for: VALPROATE No components found for:  CBMZ  Current Medications: Current Outpatient Medications  Medication Sig Dispense Refill  . ARIPiprazole (ABILIFY) 10 MG tablet Take 1 tablet (10 mg total) by mouth at bedtime. 30 tablet 1  . fluticasone (FLONASE) 50 MCG/ACT nasal spray Place 2 sprays into both nostrils daily. (Patient not taking: Reported on 08/25/2020) 1 g 0  . fluvoxaMINE (LUVOX) 50 MG tablet Take 3 tablets (150 mg total) by mouth at bedtime. 90 tablet 1  . hydrOXYzine (ATARAX/VISTARIL) 25 MG tablet Take 1 tablet (  25 mg total) by mouth 3 (three) times daily as needed for anxiety. 75 tablet 0  . lamoTRIgine (LAMICTAL) 100 MG tablet Take 1.5 tablets (150 mg total) by mouth at bedtime. 45 tablet 1  . Levonorgestrel (KYLEENA IU) by Intrauterine route.    . pantoprazole (PROTONIX) 40 MG tablet Take 1 tablet (40 mg total) by mouth 2 (two) times daily. 60 tablet 3  . sucralfate (CARAFATE) 1 g tablet Take 1 tablet (1 g total) by mouth every 6 (six) hours. 30 tablet 3  . traZODone (DESYREL) 100 MG tablet Take 1 tablet (100 mg total) by mouth at bedtime. 30 tablet 1   No current facility-administered medications for this visit.     Musculoskeletal: Strength & Muscle Tone: unable to assess since visit was over the telemedicine. Gait & Station: unable to assess since visit was over the telemedicine. Patient leans: N/A  Psychiatric Specialty Exam: ROSReview of 12 systems negative except as mentioned in HPI   There were no vitals taken for this visit.There is no height or weight on file to calculate BMI.  General Appearance: Unable to assess since appointment was on the telephone  Eye Contact:  Unable to assess since appointment was on the telephone  Speech:  Clear and Coherent and Normal Rate  Volume:  Normal  Mood: "good..."   Affect:  Unable to assess since appointment was on the telephone  Thought Process:  Goal Directed and Linear  Orientation:  Full (Time, Place, and Person)  Thought Content: Logical   Suicidal Thoughts:  No  Homicidal Thoughts:  No  Memory:  Immediate;   Fair Recent;   Fair Remote;   Fair  Judgement:  Good  Insight:  Good  Psychomotor Activity:  Normal  Concentration:  Concentration: Good and Attention Span: Good  Recall:  Good  Fund of Knowledge: Good  Language: Good  Akathisia:  No    AIMS (if indicated): not done  Assets:  Communication Skills Desire for Improvement Financial Resources/Insurance Housing Leisure Time Physical Health Social Support Transportation Vocational/Educational  ADL's:  Intact  Cognition: WNL  Sleep:  Good     Screenings: AIMS   Flowsheet Row Admission (Discharged) from 05/13/2017 in [place] Total Score 0    AUDIT   Flowsheet Row Admission (Discharged) from 07/27/2020 in [facility] Admission (Discharged) from 05/13/2017 in [place]  Alcohol Use Disorder Identification Test Final Score (AUDIT) 6 0    GAD-7   Flowsheet Row Office Visit from 04/05/2020 in [place] at [facility]  Total GAD-7 Score 7    PHQ2-9   Flowsheet Row Counselor from 07/26/2020 in [place] from 06/07/2020 in [place] Visit from 04/05/2020 in [place] at [facility] Visit from 06/19/2017 in [place] at [facility]  PHQ-2 Total Score 5 0 1 0  PHQ-9 Total Score 22 7 7 4        Assessment and Plan:   Sherryann is a 26 yr old Caucasian female who is single, employed, has a history of bipolar disorder, generalized anxiety disorder, insomnia evaluated today for a follow-up visit for medication management. She reports continued improvement in mood and anxiety since the last  appointment and no suicidal thoughts.  She does not appear depressed, manic/hypomanic or psychotic. Has been using MJA intermittently but reports cutting down and reports not using alcohol. Reviewed response to current medication and recommneding medications as below.  Plan as below.    Plan  Bipolar 2 disorder (chronic, in remission) Continue with lamotrigine  150 mg daily.  Continue withLuvox 150 mg p.o. daily Continue Abilify at10mg  po qd.  For generalized anxiety disorder-(chronic and stable) -  ind therapy at [facility],  Luvox as mentioned above.  Medications above will be helpful in decreasing anxiety. Previously tried Prozac upto 10 mg daily and Zoloft 150 mg daily - stopped due to inefficacy.   For insomnia-chronic and stable Continue Trazodone at150mg  p.o. nightly     Orlene Erm, MD 10/21/2020, 10:21 AM

## 2020-10-24 MED FILL — traZODone HCL 100 MG TABS: 100 | 30 days supply | Qty: 30 | Fill #0

## 2020-10-24 MED FILL — lamoTRIgine 100 MG TABS: 100 | 30 days supply | Qty: 45 | Fill #0

## 2020-10-24 MED FILL — FLUVOXAMINE MALEATE 50 MG T: 50 | 30 days supply | Qty: 90 | Fill #0

## 2020-10-27 MED FILL — ARIPIPRAZOLE 10 MG TABS: 10 | 30 days supply | Qty: 30 | Fill #0

## 2020-11-01 ENCOUNTER — Telehealth: Payer: Self-pay

## 2020-11-01 NOTE — Telephone Encounter (Signed)
Returned pts call and discussed escalating anxiety symptoms.  Will email pt psychoeducational resources and local crisis resources to review and fax note for work (2 days). Scheduled counseling session tomorrow 1/26 @9am . Recommended pt reach out to psychiatrist for possible medication-related changes.  Pt does not have a fax number at work for the note to be faxed to--pt states that she will pick note up at the office.  Jeanmarie Plant, MSW, LCSW Outpatient Therapist/Triage Specialist

## 2020-11-01 NOTE — Telephone Encounter (Signed)
she having a problem with her anxiety and depression . she states that you told her that she can call anytime

## 2020-11-02 ENCOUNTER — Other Ambulatory Visit: Payer: Self-pay | Admitting: Child and Adolescent Psychiatry

## 2020-11-02 ENCOUNTER — Telehealth (INDEPENDENT_AMBULATORY_CARE_PROVIDER_SITE_OTHER): Payer: No Typology Code available for payment source | Admitting: Child and Adolescent Psychiatry

## 2020-11-02 ENCOUNTER — Ambulatory Visit (INDEPENDENT_AMBULATORY_CARE_PROVIDER_SITE_OTHER): Payer: No Typology Code available for payment source | Admitting: Licensed Clinical Social Worker

## 2020-11-02 ENCOUNTER — Other Ambulatory Visit: Payer: Self-pay

## 2020-11-02 DIAGNOSIS — F3181 Bipolar II disorder: Secondary | ICD-10-CM | POA: Diagnosis not present

## 2020-11-02 DIAGNOSIS — F411 Generalized anxiety disorder: Secondary | ICD-10-CM

## 2020-11-02 MED ORDER — FLUVOXAMINE MALEATE 100 MG PO TABS: 200.0000 mg | ORAL_TABLET | Freq: Every day | ORAL | 0 refills | Status: DC

## 2020-11-02 MED ORDER — TRAZODONE HCL 100 MG PO TABS: 150.0000 mg | ORAL_TABLET | Freq: Every day | ORAL | 0 refills | Status: DC

## 2020-11-02 MED FILL — traZODone HCL 100 MG TABS: 100 | 30 days supply | Qty: 45 | Fill #0

## 2020-11-02 MED FILL — FLUVOXAMINE MALEATE 100 MG: 100 | 30 days supply | Qty: 60 | Fill #0

## 2020-11-02 NOTE — Progress Notes (Addendum)
Virtual Visit via Video Note  I connected with Victoria Simmons on 11/02/20 at  9:00 AM EST by a video enabled telemedicine application and verified that I am speaking with the correct person using two identifiers.  Location: Patient: home Provider: remote office [place], [place])   I discussed the limitations of evaluation and management by telemedicine and the availability of in person appointments. The patient expressed understanding and agreed to proceed.   I discussed the assessment and treatment plan with the patient. The patient was provided an opportunity to ask questions and all were answered. The patient agreed with the plan and demonstrated an understanding of the instructions.   The patient was advised to call back or seek an in-person evaluation if the symptoms worsen or if the condition fails to improve as anticipated.  I provided 33 minutes of non-face-to-face time during this encounter.   Derotha Fishbaugh R Dannie Woolen, LCSW    THERAPIST PROGRESS NOTE  Session Time: 9-9:33a  Participation Level: Active  Behavioral Response: Neat and Well GroomedAlertAnxious and Depressed  Type of Therapy: Individual Therapy  Treatment Goals addressed: Anxiety and Coping . Goal: reduce anxiety attacks to 3/7 days per week.   Interventions: CBT and DBT  Summary: Victoria Simmons is a 25 y.o. female who presents with worsening anxiety/panic symptoms and worsening depression symptoms. Pt denies SI but feels her mood is significantly lower in the past week. Pt reports poor sleep quality and quantity. Pt reports that she is compliant with medication. Pt also reporting that she feels that anxiety is triggering some GI issues--pt is vomiting 3+ times per day on most days. Pt reports that she has had multiple tests and feels that this is triggered by anxiety.  Spent majority of session helping pt identify anxiety and panic attack triggers. Reviewed grounding exercises to help with panic and mindfulness  exercises to bring down high levels of generalized anxiety. Discussed importance of social engagement to help counter isolation from depression. Reviewed importance of self care and discussed how pt could balance overall life activities better.   Pt feels that her work is triggering lots of her emotional stress--pts supervisor is very accommodating and is allowing Victoria Simmons to take time off work and keep her job.   Sent pt email of psychoeducational resources: managing anxiety/panic, managing depression. Email contained link for mobile crisis and 24/7 [place] center.   Suicidal/Homicidal: No.  Pt denies SI, HI, and AVH   Therapist Response: Victoria Simmons is experiencing an episode of escalating symptoms, which requires immediate interventions including: allowing pt to take time off work, more frequent therapy sessions, reaching out to psychiatrist to see if med adjustments are indicated.  Progress currently fluctuating. Treatment to continue as indicated.   Plan: Return again in 1 weeks.  Diagnosis: Axis I: Bipolar, Depressed and Generalized Anxiety Disorder    Axis II: No diagnosis    Rachel Bo Kazuko Clemence, LCSW 11/02/2020

## 2020-11-02 NOTE — Progress Notes (Signed)
Virtual Visit via Video Note  I connected with Victoria Simmons on 11/02/20 at  1:00 PM EST by a video enabled telemedicine application and verified that I am speaking with the correct person using two identifiers.  Location: Patient: home Provider: office   I discussed the limitations of evaluation and management by telemedicine and the availability of in person appointments. The patient expressed understanding and agreed to proceed.    I discussed the assessment and treatment plan with the patient. The patient was provided an opportunity to ask questions and all were answered. The patient agreed with the plan and demonstrated an understanding of the instructions.   The patient was advised to call back or seek an in-person evaluation if the symptoms worsen or if the condition fails to improve as anticipated.  I provided 20 minutes of non-face-to-face time during this encounter.   Victoria Erm, MD    [facility] MD/PA/NP OP Progress Note  11/02/2020 1:27 PM Victoria Simmons  MRN:  [id number]  Chief Complaint: Medication management follow-up for bipolar disorder, generalized anxiety disorder and insomnia  Synopsis: Victoria Simmons is a 25 year old Caucasian female, employed at daycare, with history of bipolar disorder type II, GAD, insomnia was seen by Dr. Einar Simmons since 2017 and transition to this writer in April 2020.  She is currently prescribed Lamictal 150 mg once a day, Abilify 10 mg once a day, trazodone 150 mg once a day and Luvox 150 mg once a day.   HPI:   Victoria Simmons was seen and evaluated over telemedicine encounter for follow-up. She reached out to her therapist yesterday for worsening of anxiety and per therapist recommendation she made an appointment to discuss medication adjustment today. She also saw her therapist this morning.  She reports that since last 1 week she has noticed worsening of anxiety in the context of changing circumstances at work. 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 She reports that she had panic attack yesterday and has decided to take time off of the work this week and planning to return back to work next week. She reports that she has been looking for different jobs because of stressful work environment.  In addition to worsening of anxiety she reports that her mood has been also decreasing over the last week. She rates her mood around 5 or 6 out of 10(10 = happiest mood) on most days. She also reports that she has been able to fall asleep but has been having difficulties to sustain sleep. She denies any thoughts of suicide or self-harm, denies problems with appetite. She denies any AVH, HI. She does not admit any delusions. She reports that she has not used marijuana recently and her last alcohol drink was about a month ago. Denies any other substance use. She reports that she had a good therapy session this morning and has another session scheduled next week.  She reports that she has been compliant to her medications. We discussed to increase her fluoxetine to 200 mg once a day and trazodone to 150 mg at bedtime for sleep. She verbalized understanding and agreed with the plan. I discussed follow-up plan 4 weeks or earlier if needed. She verbalized understanding and agreed with the plan.   Visit Diagnosis:    ICD-10-CM   1. GAD (generalized anxiety disorder)  F41.1 fluvoxaMINE (LUVOX) 100 MG tablet  2. Bipolar 2 disorder, major depressive episode ([place])  F31.81     Past Psychiatric History: PPHx reviewed today and as followin. 2 previous inpatient admission at [facility] at the age of 73, and last at [facility], no previous suicide attempts, has history of cutting and her past medication trials include Prozac and Zoloft.  Decreasing Luvox to 50 mg worsened her symptoms of anxiety therefore it was increased back to 100 mg once a day.   Previous trials of Zoloft 150 mg  and Prozac 10 mg stopped due to inefficacy.   Past Medical History:  Past Medical History:  Diagnosis Date  . Anxiety   . Bipolar 2 disorder ([place])   . Depression   . Dysmenorrhea   . Hypertriglyceridemia 2020  . MDD (major depressive disorder) 06/19/2017  . Ovarian torsion 2008    Past Surgical History:  Procedure Laterality Date  . OVARY SURGERY  06/30/2007   dx laparoscopy, reduction of right adnexal torsion, bilateral oophoropexy of each ovary to posterior uterine fundus    Family Psychiatric History: As mentioned in initial H&P, reviewed today, no change Family History:  Family History  Problem Relation Age of Onset  . Hepatitis B Mother   . Anxiety disorder Mother   . Hypertension Father   . ADD / ADHD Brother   . Ovarian cancer Paternal Grandmother     Social History:  Social History   Socioeconomic History  . Marital status: Single    Spouse name: Not on file  . Number of children: 0  . Years of education: Not on file  . Highest education level: Some college, no degree  Occupational History  . Occupation: [employer]    Comment: full time  Tobacco Use  . Smoking status: Never Smoker  . Smokeless tobacco: Never Used  Vaping Use  . Vaping Use: Former  Substance and Sexual Activity  . Alcohol use: Yes    Comment: last drink on Tuesday,: pt had a glass of wine, drinks twice a month (5-7 drinks)  . Drug use: Yes    Types: Marijuana    Comment: last use 16 Aug 2020  . Sexual activity: Not Currently  Other Topics Concern  . Not on file  Social History Narrative          Social Determinants of Health   Financial Resource Strain: Not on file  Food Insecurity: Not on file  Transportation Needs: Not on file  Physical Activity: Not on file  Stress: Not on file  Social Connections: Not on file    Allergies: No Known Allergies  Metabolic Disorder Labs: Lab Results  Component Value Date   HGBA1C 5.0 07/26/2020   MPG 97 07/26/2020   MPG 94  05/14/2017   No results found for: PROLACTIN Lab Results  Component Value Date   CHOL 226 (H) 07/26/2020   TRIG 138 07/26/2020   HDL 44 07/26/2020   CHOLHDL 5.1 07/26/2020   VLDL 28 07/26/2020   LDLCALC 154 (H) 07/26/2020   LDLCALC 103 (H) 04/05/2020   Lab Results  Component Value Date   TSH 2.401 07/26/2020   TSH 2.64 04/05/2020    Therapeutic Level Labs: No results found for: LITHIUM No results found for: VALPROATE No components found for:  CBMZ  Current Medications: Current Outpatient Medications  Medication Sig Dispense Refill  . ARIPiprazole (ABILIFY) 10 MG tablet Take 1 tablet (10 mg total) by mouth at bedtime. 30 tablet 1  . fluticasone (FLONASE) 50 MCG/ACT nasal spray Place 2 sprays into both nostrils daily. (Patient not taking:  Reported on 08/25/2020) 1 g 0  . fluvoxaMINE (LUVOX) 100 MG tablet Take 2 tablets (200 mg total) by mouth at bedtime. 60 tablet 0  . hydrOXYzine (ATARAX/VISTARIL) 25 MG tablet Take 1 tablet (25 mg total) by mouth 3 (three) times daily as needed for anxiety. 75 tablet 0  . lamoTRIgine (LAMICTAL) 100 MG tablet Take 1.5 tablets (150 mg total) by mouth at bedtime. 45 tablet 1  . Levonorgestrel (KYLEENA IU) by Intrauterine route.    . pantoprazole (PROTONIX) 40 MG tablet Take 1 tablet (40 mg total) by mouth 2 (two) times daily. 60 tablet 3  . sucralfate (CARAFATE) 1 g tablet Take 1 tablet (1 g total) by mouth every 6 (six) hours. 30 tablet 3  . traZODone (DESYREL) 100 MG tablet Take 1.5 tablets (150 mg total) by mouth at bedtime. 45 tablet 0   No current facility-administered medications for this visit.     Musculoskeletal: Strength & Muscle Tone: unable to assess since visit was over the telemedicine. Gait & Station: unable to assess since visit was over the telemedicine. Patient leans: N/A  Psychiatric Specialty Exam: ROSReview of 12 systems negative except as mentioned in HPI   There were no vitals taken for this visit.There is no height  or weight on file to calculate BMI.  General Appearance: Casual and Fairly Groomed  Eye Contact:  Good  Speech:  Clear and Coherent and Normal Rate  Volume:  Normal  Mood: "good..."  Affect:  Appropriate, Congruent and Full Range  Thought Process:  Goal Directed and Linear  Orientation:  Full (Time, Place, and Person)  Thought Content: Logical   Suicidal Thoughts:  No  Homicidal Thoughts:  No  Memory:  Immediate;   Fair Recent;   Fair Remote;   Fair  Judgement:  Good  Insight:  Good  Psychomotor Activity:  Normal  Concentration:  Concentration: Good and Attention Span: Good  Recall:  Good  Fund of Knowledge: Good  Language: Good  Akathisia:  No    AIMS (if indicated): not done  Assets:  Communication Skills Desire for Improvement Financial Resources/Insurance Housing Leisure Time Physical Health Social Support Transportation Vocational/Educational  ADL's:  Intact  Cognition: WNL  Sleep:  Good     Screenings: AIMS   Flowsheet Row Admission (Discharged) from 05/13/2017 in [place] Total Score 0    AUDIT   Flowsheet Row Admission (Discharged) from 07/27/2020 in [place] [facility] Admission (Discharged) from 05/13/2017 in [place]  Alcohol Use Disorder Identification Test Final Score (AUDIT) 6 0    GAD-7   Flowsheet Row Office Visit from 04/05/2020 in [place] at [facility]  Total GAD-7 Score 7    PHQ2-9   Flowsheet Row Counselor from 07/26/2020 in [place] from 06/07/2020 in [place] Visit from 04/05/2020 in [place] at [facility] Visit from 06/19/2017 in [place] at [facility]  PHQ-2 Total Score 5 0 1 0  PHQ-9 Total Score 22 7 7 4        Assessment and Plan:   Jennfier is a 25 yr old Caucasian female who is single, employed, has a history of bipolar disorder,  generalized anxiety disorder, insomnia evaluated today for a follow-up visit for medication management. She reports worsening of anxiety in the context of new psychosocial stressors at work. Recommending to increase the dose of Luvox for anxiety and mood. Has not been using [employer]  recently, and reports not using alcohol. Reviewed response to current medication and recommneding medications as below.   Plan as below.    Plan  Bipolar 2 disorder (chronic, stable) Continue with lamotrigine  150 mg daily.  IncreaseLuvox to 200 mg p.o. daily Continue Abilify at10mg  po qd.  For generalized anxiety disorder-(chronic and worse) -  ind therapy at [facility],  Luvox as mentioned above.  Medications above will be helpful in decreasing anxiety. Previously tried Prozac upto 10 mg daily and Zoloft 150 mg daily - stopped due to inefficacy.   For insomnia-chronic and stable Increase Trazodone to150mg  p.o. nightly Continue Atarax 25 mg QHS PRN for sleep.      Victoria Erm, MD 11/02/2020, 1:27 PM

## 2020-11-09 ENCOUNTER — Other Ambulatory Visit: Payer: Self-pay

## 2020-11-09 ENCOUNTER — Ambulatory Visit (INDEPENDENT_AMBULATORY_CARE_PROVIDER_SITE_OTHER): Payer: No Typology Code available for payment source | Admitting: Licensed Clinical Social Worker

## 2020-11-09 DIAGNOSIS — F3181 Bipolar II disorder: Secondary | ICD-10-CM

## 2020-11-09 DIAGNOSIS — F411 Generalized anxiety disorder: Secondary | ICD-10-CM | POA: Diagnosis not present

## 2020-11-09 NOTE — Progress Notes (Signed)
Virtual Visit via Video Note  I connected with Victoria Simmons on 11/09/20 at  9:00 AM EST by a video enabled telemedicine application and verified that I am speaking with the correct person using two identifiers.  Location: Patient: home Provider: remote office [place], [place])   I discussed the limitations of evaluation and management by telemedicine and the availability of in person appointments. The patient expressed understanding and agreed to proceed.   I discussed the assessment and treatment plan with the patient. The patient was provided an opportunity to ask questions and all were answered. The patient agreed with the plan and demonstrated an understanding of the instructions.   The patient was advised to call back or seek an in-person evaluation if the symptoms worsen or if the condition fails to improve as anticipated.  I provided 30 minutes of non-face-to-face time during this encounter.   Victoria Simmons R Victoria Kennard, LCSW    THERAPIST PROGRESS NOTE  Session Time: 9-9:30a  Participation Level: Active  Behavioral Response: Neat and Well GroomedAlertAnxious  Type of Therapy: Individual Therapy  Treatment Goals addressed: Elevate mood and show evidence of usual energy, activities, and socialization level; increase participation in daily social, academic, and vocational activities.   Interventions: CBT and Supportive  Summary: Victoria Simmons is a 25 y.o. female who presents with continuing symptoms related to bipolar disorder and anxiety diagnoses. Pt reports that she is continuing to have some mood swings, and pt is trying to work with current energy levels. Pt is noting more anxiety while working--pt had conversation with supervisor and supervisor is willing to allow pt to take off Wednesdays to "break up" the week.   Pt requested that LCSW counselor write a note allowing her off work from 2/2 and back at work 2/7. Note faxed to front office, printed for pt to come and pick up  in the office.   Allowed pt to explore and express current stressors. Pt reports that main stressor currently is work.   Reviewed stress/anxiety management techniques. Pt does report a reduction in overall panic attacks since last session.   Discussed pushing through discomforts at work using DBT-based techniques.   Continued to encourage positive social engagement, positive behavioral activities, self care, and overall life balance.   Suicidal/Homicidal: No  Therapist Response: Victoria Simmons is trying hard to take medications as prescribed, and increasing daily social, academic and vocational activities and other potentially rewarding experiences. Encouraged pt to tolerate distress and manage distress when in provoking environments. Pointed out previous successes. Pt denies current suicidal urges. Victoria Simmons is taking these steps towards fulfilling goals but has not yet met goals. Progressing well. Treatment to continue as indicated  Plan: Return again in 2 weeks.  Diagnosis: Axis I: Bipolar, Depressed    Axis II: No diagnosis    [place], LCSW 11/09/2020

## 2020-11-23 ENCOUNTER — Other Ambulatory Visit: Payer: Self-pay

## 2020-11-23 ENCOUNTER — Ambulatory Visit (INDEPENDENT_AMBULATORY_CARE_PROVIDER_SITE_OTHER): Payer: No Typology Code available for payment source | Admitting: Licensed Clinical Social Worker

## 2020-11-23 DIAGNOSIS — F411 Generalized anxiety disorder: Secondary | ICD-10-CM | POA: Diagnosis not present

## 2020-11-23 DIAGNOSIS — F3181 Bipolar II disorder: Secondary | ICD-10-CM

## 2020-11-23 NOTE — Progress Notes (Signed)
Virtual Visit via Video Note  I connected with Victoria Simmons on 11/23/20 at  9:00 AM EST by a video enabled telemedicine application and verified that I am speaking with the correct person using two identifiers.  Location: Patient: home Provider: remote office [place], [place])   I discussed the limitations of evaluation and management by telemedicine and the availability of in person appointments. The patient expressed understanding and agreed to proceed.  I discussed the assessment and treatment plan with the patient. The patient was provided an opportunity to ask questions and all were answered. The patient agreed with the plan and demonstrated an understanding of the instructions.   The patient was advised to call back or seek an in-person evaluation if the symptoms worsen or if the condition fails to improve as anticipated.  I provided 30 minutes of non-face-to-face time during this encounter.   Machele Deihl R Therese Rocco, LCSW    THERAPIST PROGRESS NOTE  Session Time: 9-9:30a  Participation Level: Active  Behavioral Response: NeatAlertEuthymic  Type of Therapy: Individual Therapy  Treatment Goals addressed: Increasing ability to manage moods; free of thoughts of self harm; improve coping skills  Interventions: CBT, DBT and Supportive  Summary: Victoria Simmons is a 25 y.o. female who presents with improving symptoms related to bipolar disorder and anxiety diagnoses. Pt reports that she has been compliant with her medication and is doing well since recent medication dosage change. Pt reports that mood is stable and that she is managing stress/anxiety well. Pt reports only one panicky episode which she managed/controlled prior to it becoming a full panic attack. Pt reporting good quality and quantity of sleep.   Allowed pt to explore and express thoughts and feelings about recent life events--pt initially upset about breakup with girlfriend. Allowed pt to process through this--pt  discussed the relationships and how she was feeling within the context of the relationship. Overall, Victoria Simmons reports that she is not very upset over the breakup and felt that things were not good towards the end. Alpha states that she has this time to now focus on herself and overall healing. Validated pts feelings and encouraged continued focus on self care and life balance.    Suicidal/Homicidal: No  Therapist Response: [name] is able to identify relationship experiences and weigh out pros and cons and able to set boundaries/limits with other to be protective of her own thoughts/feelings. [name] is able to use positive self talk as an intervention to make her feel more empowered and worthy of expressing her own wants/needs within the workplace, relationships, and friendships. Victoria Simmons reports that she is working out regularly, which is an example of engaging in physical and recreational activities that are reflective of increased energy and interest/engagement with others. These behaviors are evidence of continued progress and steps towards overall goal achievement. Treatment to continue as indicated.    Plan: Return again in 3 weeks.  Diagnosis: Axis I: Bipolar, Depressed    Axis II: No diagnosis    Victoria Bo Tru Leopard, LCSW 11/23/2020

## 2020-11-24 MED FILL — ARIPIPRAZOLE 10 MG TABS: 10 | 30 days supply | Qty: 30 | Fill #1

## 2020-12-02 ENCOUNTER — Other Ambulatory Visit: Payer: Self-pay

## 2020-12-02 ENCOUNTER — Other Ambulatory Visit: Payer: Self-pay | Admitting: Child and Adolescent Psychiatry

## 2020-12-02 ENCOUNTER — Telehealth (INDEPENDENT_AMBULATORY_CARE_PROVIDER_SITE_OTHER): Payer: No Typology Code available for payment source | Admitting: Child and Adolescent Psychiatry

## 2020-12-02 DIAGNOSIS — F3181 Bipolar II disorder: Secondary | ICD-10-CM

## 2020-12-02 DIAGNOSIS — F411 Generalized anxiety disorder: Secondary | ICD-10-CM

## 2020-12-02 MED ORDER — LAMOTRIGINE 100 MG PO TABS: 150.0000 mg | ORAL_TABLET | Freq: Every day | ORAL | 1 refills | Status: DC

## 2020-12-02 MED ORDER — ARIPIPRAZOLE 10 MG PO TABS: 10.0000 mg | ORAL_TABLET | Freq: Every day | ORAL | 1 refills | Status: DC

## 2020-12-02 MED ORDER — TRAZODONE HCL 100 MG PO TABS
150.0000 mg | ORAL_TABLET | Freq: Every day | ORAL | 0 refills | Status: DC
Start: 2020-12-02 — End: 2020-12-30

## 2020-12-02 MED ORDER — HYDROXYZINE HCL 25 MG PO TABS: 25.0000 mg | ORAL_TABLET | Freq: Three times a day (TID) | ORAL | 0 refills | Status: DC | PRN

## 2020-12-02 MED ORDER — FLUVOXAMINE MALEATE 100 MG PO TABS: 200.0000 mg | ORAL_TABLET | Freq: Every day | ORAL | 0 refills | Status: DC

## 2020-12-02 MED FILL — FLUVOXAMINE MALEATE 100 MG: 100 | 30 days supply | Qty: 60 | Fill #0

## 2020-12-02 MED FILL — lamoTRIgine 100 MG TABS: 100 | 30 days supply | Qty: 45 | Fill #0

## 2020-12-02 MED FILL — traZODone HCL 100 MG TABS: 100 | 30 days supply | Qty: 45 | Fill #0

## 2020-12-02 MED FILL — hydrOXYzine HCL 25 MG TABS: 25 | 25 days supply | Qty: 75 | Fill #0

## 2020-12-02 NOTE — Progress Notes (Signed)
Virtual Visit via Video Note  I connected with Victoria Simmons on 12/02/20 at  9:30 AM EST by a video enabled telemedicine application and verified that I am speaking with the correct person using two identifiers.  Location: Patient: home Provider: office   I discussed the limitations of evaluation and management by telemedicine and the availability of in person appointments. The patient expressed understanding and agreed to proceed.    I discussed the assessment and treatment plan with the patient. The patient was provided an opportunity to ask questions and all were answered. The patient agreed with the plan and demonstrated an understanding of the instructions.   The patient was advised to call back or seek an in-person evaluation if the symptoms worsen or if the condition fails to improve as anticipated.  I provided 20 minutes of non-face-to-face time during this encounter.   Orlene Erm, MD    [facility] MD/PA/NP OP Progress Note  12/02/2020 9:55 AM Victoria Simmons  MRN:  [id number]  Chief Complaint: Medication management follow-up for bipolar disorder, generalized anxiety disorder and insomnia.  Synopsis: Derrisha is a 25 year old Caucasian female, employed at daycare, with history of bipolar disorder type II, GAD, insomnia was seen by Dr. Einar Grad since 2017 and transition to this writer in April 2020.  She is currently prescribed Lamictal 150 mg once a day, Abilify 10 mg once a day, trazodone 150 mg once a day and Luvox 150 mg once a day.   HPI:   Ife was seen and evaluated over telemedicine encounter for medication management follow-up.  At the last appointment she was recommended to increase the dose of Luvox to 200 mg once a day for worsening of anxiety and mood and was recommended to continue rest of her current medications.  Today she reports that she has tolerated increased dose clocks well and also reports that she has noticed improvement with her mood and anxiety.  She rates  her anxiety at 4-5 out of 10(10 = most anxiety), which she reports was 10 out of 10 last months.  She also reports improvement with her mood and reports that her mood is around 6 or 7 out of 10(10 = best mood).  She attributes improvement in mood to working on coping skills which she describes as Research officer, trade [employer], painting, going to gym with her father about 3 times a week and working with a therapist.  She also reports medications have been helpful.  She reports that she continues to struggle with sleep despite increasing the dose of trazodone at the last appointment, reports that she has been able to fall asleep but sometimes wakes up early in the morning and then has a hard time going back to sleep.  In regards of appetite she also reports that she has been eating less since last 2 weeks because of low appetite and prior to that she was eating excessively.  We discussed about healthy eating and continue to watch what she eats.  She verbalized understanding.  She denies any suicidal thoughts or thoughts of violence.  She reports that she continues to work at the daycare and now she is working as an Acupuncturist for kindergarten to fifth grade and that is working well for her as compared to last month when she was assigned two-year-old classroom.  Given improvement with her current symptoms we discussed to continue with current medications and follow-up in 4 weeks or earlier if needed.  She verbalized understanding and agreed with the plan.  Visit Diagnosis:    ICD-10-CM   1. GAD (generalized anxiety disorder)  F41.1 fluvoxaMINE (LUVOX) 100 MG tablet  2. Bipolar 2 disorder, major depressive episode (HCC)  F31.81 lamoTRIgine (LAMICTAL) 100 MG tablet    ARIPiprazole (ABILIFY) 10 MG tablet    Past Psychiatric History: PPHx reviewed today and as followin. 2 previous inpatient admission at [facility] at the age of 44, and last at [facility], no previous suicide attempts, has history of cutting and her past  medication trials include Prozac and Zoloft.  Decreasing Luvox to 50 mg worsened her symptoms of anxiety therefore it was increased back to 100 mg once a day.   Previous trials of Zoloft 150 mg and Prozac 10 mg stopped due to inefficacy.   Past Medical History:  Past Medical History:  Diagnosis Date  . Anxiety   . Bipolar 2 disorder ([place])   . Depression   . Dysmenorrhea   . Hypertriglyceridemia 2020  . MDD (major depressive disorder) 06/19/2017  . Ovarian torsion 2008    Past Surgical History:  Procedure Laterality Date  . OVARY SURGERY  06/30/2007   dx laparoscopy, reduction of right adnexal torsion, bilateral oophoropexy of each ovary to posterior uterine fundus    Family Psychiatric History: As mentioned in initial H&P, reviewed today, no change Family History:  Family History  Problem Relation Age of Onset  . Hepatitis B Mother   . Anxiety disorder Mother   . Hypertension Father   . ADD / ADHD Brother   . Ovarian cancer Paternal Grandmother     Social History:  Social History   Socioeconomic History  . Marital status: Single    Spouse name: Not on file  . Number of children: 0  . Years of education: Not on file  . Highest education level: Some college, no degree  Occupational History  . Occupation: friends play house    Comment: full time  Tobacco Use  . Smoking status: Never Smoker  . Smokeless tobacco: Never Used  Vaping Use  . Vaping Use: Former  Substance and Sexual Activity  . Alcohol use: Yes    Comment: last drink on Tuesday,: pt had a glass of wine, drinks twice a month (5-7 drinks)  . Drug use: Yes    Types: Marijuana    Comment: last use 16 Aug 2020  . Sexual activity: Not Currently  Other Topics Concern  . Not on file  Social History Narrative          Social Determinants of Health   Financial Resource Strain: Not on file  Food Insecurity: Not on file  Transportation Needs: Not on file  Physical Activity: Not on file  Stress: Not on file   Social Connections: Not on file    Allergies: No Known Allergies  Metabolic Disorder Labs: Lab Results  Component Value Date   HGBA1C 5.0 07/26/2020   MPG 97 07/26/2020   MPG 94 05/14/2017   No results found for: PROLACTIN Lab Results  Component Value Date   CHOL 226 (H) 07/26/2020   TRIG 138 07/26/2020   HDL 44 07/26/2020   CHOLHDL 5.1 07/26/2020   VLDL 28 07/26/2020   LDLCALC 154 (H) 07/26/2020   LDLCALC 103 (H) 04/05/2020   Lab Results  Component Value Date   TSH 2.401 07/26/2020   TSH 2.64 04/05/2020    Therapeutic Level Labs: No results found for: LITHIUM No results found for: VALPROATE No components found for:  CBMZ  Current Medications: Current Outpatient Medications  Medication Sig Dispense Refill  . ARIPiprazole (ABILIFY) 10 MG tablet Take 1 tablet (10 mg total) by mouth at bedtime. 30 tablet 1  . fluticasone (FLONASE) 50 MCG/ACT nasal spray Place 2 sprays into both nostrils daily. (Patient not taking: Reported on 08/25/2020) 1 g 0  . fluvoxaMINE (LUVOX) 100 MG tablet Take 2 tablets (200 mg total) by mouth at bedtime. 60 tablet 0  . hydrOXYzine (ATARAX/VISTARIL) 25 MG tablet Take 1 tablet (25 mg total) by mouth 3 (three) times daily as needed for anxiety. 75 tablet 0  . lamoTRIgine (LAMICTAL) 100 MG tablet Take 1.5 tablets (150 mg total) by mouth at bedtime. 45 tablet 1  . Levonorgestrel (KYLEENA IU) by Intrauterine route.    . pantoprazole (PROTONIX) 40 MG tablet Take 1 tablet (40 mg total) by mouth 2 (two) times daily. 60 tablet 3  . sucralfate (CARAFATE) 1 g tablet Take 1 tablet (1 g total) by mouth every 6 (six) hours. 30 tablet 3  . traZODone (DESYREL) 100 MG tablet Take 1.5 tablets (150 mg total) by mouth at bedtime. 45 tablet 0   No current facility-administered medications for this visit.     Musculoskeletal: Strength & Muscle Tone: unable to assess since visit was over the telemedicine. Gait & Station: unable to assess since visit was over the  telemedicine. Patient leans: N/A  Psychiatric Specialty Exam: ROSReview of 12 systems negative except as mentioned in HPI   There were no vitals taken for this visit.There is no height or weight on file to calculate BMI.  General Appearance: Casual and Fairly Groomed  Eye Contact:  Good  Speech:  Clear and Coherent and Normal Rate  Volume:  Normal  Mood: "better.."  Affect:  Appropriate, Congruent and Restricted  Thought Process:  Goal Directed and Linear  Orientation:  Full (Time, Place, and Person)  Thought Content: Logical   Suicidal Thoughts:  No  Homicidal Thoughts:  No  Memory:  Immediate;   Fair Recent;   Fair Remote;   Fair  Judgement:  Good  Insight:  Good  Psychomotor Activity:  Normal  Concentration:  Concentration: Good and Attention Span: Good  Recall:  Good  Fund of Knowledge: Good  Language: Good  Akathisia:  No    AIMS (if indicated): not done  Assets:  Communication Skills Desire for Improvement Financial Resources/Insurance Housing Leisure Time Physical Health Social Support Transportation Vocational/Educational  ADL's:  Intact  Cognition: WNL  Sleep:  Good     Screenings: AIMS   Flowsheet Row Admission (Discharged) from 05/13/2017 in [place] Total Score 0    AUDIT   Flowsheet Row Admission (Discharged) from 07/27/2020 in [place] [facility] Admission (Discharged) from 05/13/2017 in [place]  Alcohol Use Disorder Identification Test Final Score (AUDIT) 6 0    GAD-7   Flowsheet Row Office Visit from 04/05/2020 in [place] at [facility]  Total GAD-7 Score 7    PHQ2-9   Flowsheet Row Counselor from 11/23/2020 in [place] from 07/26/2020 in [place] from 06/07/2020 in [place] Visit from 04/05/2020 in [place] at [facility] Visit from 06/19/2017 in [place] at [place]  PHQ-2 Total Score 0 5 0 1 0  PHQ-9 Total Score - 22 7 7 4     Flowsheet Row Counselor from 11/23/2020 in [place] Admission (Discharged) from 07/27/2020 in [place]  INPATIENT ADULT 400B ED from 07/26/2020 in [place] CATEGORY No Risk High Risk High Risk       Assessment and Plan:   Leola is a 25 yr old Caucasian female who is single, employed, has a history of bipolar disorder, generalized anxiety disorder, insomnia evaluated today for a follow-up visit for medication management. She reported worsening of anxiety in the context of new psychosocial stressors at work and was recommended to increase the dose of Luvox for anxiety and mood at the last appointment.  She reports improvement with mood and anxiety since last appointment therefore recommending to continue with current medications.  Has not been using MJA recently, and reports not using alcohol. Reviewed response to current medication and recommneding medications as below.   Plan as below.    Plan  Bipolar 2 disorder (chronic, stable) Continue with lamotrigine  150 mg daily.  Continue Luvox  200 mg p.o. daily Continue Abilify at10mg  po qd.  For generalized anxiety disorder-(chronic and worse) -  ind therapy at [facility],  Luvox as mentioned above.  Medications above will be helpful in decreasing anxiety. Previously tried Prozac upto 10 mg daily and Zoloft 150 mg daily - stopped due to inefficacy.   For insomnia-chronic and stable Continue trazodone150mg  p.o. nightly Can take Atarax 25-50 mg QHS PRN for sleep.      Orlene Erm, MD 12/02/2020, 9:55 AM

## 2020-12-16 ENCOUNTER — Telehealth: Payer: No Typology Code available for payment source | Admitting: Child and Adolescent Psychiatry

## 2020-12-20 ENCOUNTER — Ambulatory Visit (INDEPENDENT_AMBULATORY_CARE_PROVIDER_SITE_OTHER): Payer: No Typology Code available for payment source | Admitting: Licensed Clinical Social Worker

## 2020-12-20 ENCOUNTER — Other Ambulatory Visit: Payer: Self-pay

## 2020-12-20 DIAGNOSIS — F411 Generalized anxiety disorder: Secondary | ICD-10-CM

## 2020-12-20 NOTE — Progress Notes (Signed)
Virtual Visit via Video Note  I connected with Victoria Simmons on 12/20/20 at 10:00 AM EDT by a video enabled telemedicine application and verified that I am speaking with the correct person using two identifiers.  Location: Patient: home  Provider: [facility]   I discussed the limitations of evaluation and management by telemedicine and the availability of in person appointments. The patient expressed understanding and agreed to proceed.  I discussed the assessment and treatment plan with the patient. The patient was provided an opportunity to ask questions and all were answered. The patient agreed with the plan and demonstrated an understanding of the instructions.   The patient was advised to call back or seek an in-person evaluation if the symptoms worsen or if the condition fails to improve as anticipated.  I provided 20 minutes of non-face-to-face time during this encounter.   Victoria Simmons Victoria Oleda Borski, LCSW    THERAPIST PROGRESS NOTE  Session Time: 10-10:20a  Participation Level: Active  Behavioral Response: Neat and Well GroomedAlertEuthymic  Type of Therapy: Individual Therapy  Treatment Goals addressed: Coping  Interventions: Supportive  Summary: Victoria Simmons is a 25 y.o. female who presents with improving symptoms related to anxiety and bipolar disorder diagnoses. Pt reports that her overall mood is stable and that she is managing stress and anxiety well. Pt reports that she is compliant with her medication and is not having any medication-related problems at this time. Pt reports some continuing sleep issues--pt continuing to wake up and finding it hard to fall back to sleep at night.  Allowed pt to explore and express thoughts and feelings about current life stressors/achievements. Pt is happy that she is seeing a new partner.  Allowed pt to explore recent breakup and discuss what pt "took away" from that situation. Discussed pros/cons. Pt wants to be more present in new  relationship.  Discussed frequent vomiting--pt states that she is vomiting at least once/day after eating. Pt states that it is not induced--just happens.   Pt feels really good about life right now--not feeling depressed, feels that work/life balance is good right now.  Continued recommendations are as follows: self care behaviors, positive social engagements, focusing on overall work/home/life balance, and focusing on positive physical and emotional wellness.   Suicidal/Homicidal: No  Therapist Response: Victoria Simmons is continuing to identify and articulate specific current and past experiences with fears, worries, and anxiety symptoms including their impact on functioning and ways that Victoria Simmons is managing symptoms to limit impact on functioning. Victoria Simmons is increasing daily social and vocational activities. These behaviors are reflective of continuing progress. Focus on maintaining levels of stability. Treatment to continue.  Plan: Return again in 3 weeks.  Diagnosis: Axis I: Bipolar, Depressed and Generalized Anxiety Disorder    Axis II: No diagnosis    Victoria Bo Antwoine Zorn, LCSW 12/20/2020

## 2020-12-30 ENCOUNTER — Other Ambulatory Visit: Payer: Self-pay | Admitting: Child and Adolescent Psychiatry

## 2020-12-30 ENCOUNTER — Telehealth (INDEPENDENT_AMBULATORY_CARE_PROVIDER_SITE_OTHER): Payer: No Typology Code available for payment source | Admitting: Child and Adolescent Psychiatry

## 2020-12-30 ENCOUNTER — Other Ambulatory Visit: Payer: Self-pay

## 2020-12-30 ENCOUNTER — Encounter: Payer: Self-pay | Admitting: Child and Adolescent Psychiatry

## 2020-12-30 DIAGNOSIS — F3181 Bipolar II disorder: Secondary | ICD-10-CM | POA: Diagnosis not present

## 2020-12-30 DIAGNOSIS — F411 Generalized anxiety disorder: Secondary | ICD-10-CM | POA: Diagnosis not present

## 2020-12-30 MED ORDER — LAMOTRIGINE 100 MG PO TABS: 150.0000 mg | ORAL_TABLET | Freq: Every day | ORAL | 1 refills | Status: DC

## 2020-12-30 MED ORDER — HYDROXYZINE HCL 25 MG PO TABS
25.0000 mg | ORAL_TABLET | Freq: Three times a day (TID) | ORAL | 1 refills | Status: DC | PRN
Start: 2020-12-30 — End: 2020-12-30

## 2020-12-30 MED ORDER — ARIPIPRAZOLE 10 MG PO TABS: 10.0000 mg | ORAL_TABLET | Freq: Every day | ORAL | 1 refills | Status: DC

## 2020-12-30 MED ORDER — FLUVOXAMINE MALEATE 100 MG PO TABS: 200.0000 mg | ORAL_TABLET | Freq: Every day | ORAL | 1 refills | Status: DC

## 2020-12-30 MED ORDER — TRAZODONE HCL 100 MG PO TABS: 150.0000 mg | ORAL_TABLET | Freq: Every day | ORAL | 1 refills | Status: DC

## 2020-12-30 MED FILL — ARIPIPRAZOLE 10 MG TABS: 10 | 30 days supply | Qty: 30 | Fill #0

## 2020-12-30 MED FILL — traZODone HCL 100 MG TABS: 100 | 30 days supply | Qty: 45 | Fill #0

## 2020-12-30 MED FILL — lamoTRIgine 100 MG TABS: 100 | 30 days supply | Qty: 45 | Fill #0

## 2020-12-30 MED FILL — hydrOXYzine HCL 25 MG TABS: 25 | 25 days supply | Qty: 75 | Fill #0

## 2020-12-30 MED FILL — FLUVOXAMINE MALEATE 100 MG: 100 | 30 days supply | Qty: 60 | Fill #0

## 2020-12-30 NOTE — Progress Notes (Signed)
Virtual Visit via Telephone Note  I connected with Victoria Simmons on 12/30/20 at 10:00 AM EDT by telephone and verified that I am speaking with the correct person using two identifiers.  Location: Patient: home Provider: office   I discussed the limitations, risks, security and privacy concerns of performing an evaluation and management service by telephone and the availability of in person appointments. I also discussed with the patient that there may be a patient responsible charge related to this service. The patient expressed understanding and agreed to proceed.  I discussed the assessment and treatment plan with the patient. The patient was provided an opportunity to ask questions and all were answered. The patient agreed with the plan and demonstrated an understanding of the instructions.   The patient was advised to call back or seek an in-person evaluation if the symptoms worsen or if the condition fails to improve as anticipated.  I provided 15 minutes of non-face-to-face time during this encounter.   Victoria Erm, MD     [facility] MD/PA/NP OP Progress Note  12/30/2020 10:20 AM Victoria Simmons  MRN:  [id number]  Chief Complaint: Medication management follow-up for bipolar disorder, generalized anxiety disorder and insomnia.  Synopsis: Victoria Simmons is a 25 year old Caucasian female, employed at daycare, with history of bipolar disorder type II, GAD, insomnia was seen by Dr. Einar Grad since 2017 and transition to this writer in April 2020.  She is currently prescribed Lamictal 150 mg once a day, Abilify 10 mg once a day, trazodone 150 mg once a day and Luvox 150 mg once a day.   HPI:   Victoria Simmons was evaluated over telephone encounter for medication management follow-up.  That appointment was scheduled for telemedicine however she could not take a break from her work and therefore preferred to talk on the phone to maintain privacy.  She states she is doing "really really well".  She reports that  her home life is good, she started dating her girlfriend and she makes her happy, her mood has been steady and denies any low lows or depressed mood, anxiety is manageable and mostly at her work.  She reports that she has been sleeping well, eating well, denies problems with energy, denies any thoughts of suicide or self-harm.  She reports that she usually hangs out with her friends on the weekends by doing outdoor activities.  She also reports that she is exercising about 3 times a week.  She reports that she has been compliant with her medications and denies any side effects from them.  We discussed to continue with current medications and follow-up in 6 weeks or earlier if needed.  She continues to see her therapist about once every 2 to 3 weeks.  She denies any substance abuse recently including marijuana and vaping.  Visit Diagnosis:    ICD-10-CM   1. GAD (generalized anxiety disorder)  F41.1 fluvoxaMINE (LUVOX) 100 MG tablet  2. Bipolar 2 disorder, major depressive episode (HCC)  F31.81 ARIPiprazole (ABILIFY) 10 MG tablet    lamoTRIgine (LAMICTAL) 100 MG tablet    Past Psychiatric History: PPHx reviewed today and as followin. 2 previous inpatient admission at [facility] at the age of 24, and last at [facility], no previous suicide attempts, has history of cutting and her past medication trials include Prozac and Zoloft.  Decreasing Luvox to 50 mg worsened her symptoms of anxiety therefore it was increased back to 100 mg once a day.   Previous trials of Zoloft 150 mg and Prozac 10  mg stopped due to inefficacy.   Past Medical History:  Past Medical History:  Diagnosis Date  . Anxiety   . Bipolar 2 disorder ([place])   . Depression   . Dysmenorrhea   . Hypertriglyceridemia 2020  . MDD (major depressive disorder) 06/19/2017  . Ovarian torsion 2008    Past Surgical History:  Procedure Laterality Date  . OVARY SURGERY  06/30/2007   dx laparoscopy, reduction of right adnexal torsion, bilateral oophoropexy of  each ovary to posterior uterine fundus    Family Psychiatric History: As mentioned in initial H&P, reviewed today, no change Family History:  Family History  Problem Relation Age of Onset  . Hepatitis B Mother   . Anxiety disorder Mother   . Hypertension Father   . ADD / ADHD Brother   . Ovarian cancer Paternal Grandmother     Social History:  Social History   Socioeconomic History  . Marital status: Single    Spouse name: Not on file  . Number of children: 0  . Years of education: Not on file  . Highest education level: Some college, no degree  Occupational History  . Occupation: [employer]    Comment: full time  Tobacco Use  . Smoking status: Never Smoker  . Smokeless tobacco: Never Used  Vaping Use  . Vaping Use: Former  Substance and Sexual Activity  . Alcohol use: Yes    Comment: last drink on Tuesday,: pt had a glass of wine, drinks twice a month (5-7 drinks)  . Drug use: Yes    Types: Marijuana    Comment: last use 16 Aug 2020  . Sexual activity: Not Currently  Other Topics Concern  . Not on file  Social History Narrative          Social Determinants of Health   Financial Resource Strain: Not on file  Food Insecurity: Not on file  Transportation Needs: Not on file  Physical Activity: Not on file  Stress: Not on file  Social Connections: Not on file    Allergies: No Known Allergies  Metabolic Disorder Labs: Lab Results  Component Value Date   HGBA1C 5.0 07/26/2020   MPG 97 07/26/2020   MPG 94 05/14/2017   No results found for: PROLACTIN Lab Results  Component Value Date   CHOL 226 (H) 07/26/2020   TRIG 138 07/26/2020   HDL 44 07/26/2020   CHOLHDL 5.1 07/26/2020   VLDL 28 07/26/2020   LDLCALC 154 (H) 07/26/2020   LDLCALC 103 (H) 04/05/2020   Lab Results  Component Value Date   TSH 2.401 07/26/2020   TSH 2.64 04/05/2020    Therapeutic Level Labs: No results found for: LITHIUM No results found for: VALPROATE No components  found for:  CBMZ  Current Medications: Current Outpatient Medications  Medication Sig Dispense Refill  . ARIPiprazole (ABILIFY) 10 MG tablet Take 1 tablet (10 mg total) by mouth at bedtime. 30 tablet 1  . fluticasone (FLONASE) 50 MCG/ACT nasal spray Place 2 sprays into both nostrils daily. (Patient not taking: Reported on 08/25/2020) 1 g 0  . fluvoxaMINE (LUVOX) 100 MG tablet Take 2 tablets (200 mg total) by mouth at bedtime. 60 tablet 1  . hydrOXYzine (ATARAX/VISTARIL) 25 MG tablet Take 1 tablet (25 mg total) by mouth 3 (three) times daily as needed for anxiety. 75 tablet 1  . lamoTRIgine (LAMICTAL) 100 MG tablet Take 1.5 tablets (150 mg total) by mouth at bedtime. 45 tablet 1  . Levonorgestrel (KYLEENA IU) by Intrauterine  route.    . pantoprazole (PROTONIX) 40 MG tablet Take 1 tablet (40 mg total) by mouth 2 (two) times daily. 60 tablet 3  . sucralfate (CARAFATE) 1 g tablet Take 1 tablet (1 g total) by mouth every 6 (six) hours. 30 tablet 3  . traZODone (DESYREL) 100 MG tablet Take 1.5 tablets (150 mg total) by mouth at bedtime. 45 tablet 1   No current facility-administered medications for this visit.     Musculoskeletal: Strength & Muscle Tone: unable to assess since visit was over the telemedicine. Gait & Station: unable to assess since visit was over the telemedicine. Patient leans: N/A  Psychiatric Specialty Exam: ROSReview of 12 systems negative except as mentioned in HPI   There were no vitals taken for this visit.There is no height or weight on file to calculate BMI.  General Appearance: Unable to assess since appointment was on telephone  Eye Contact:  Unable to assess since appointment was on telephone  Speech:  Clear and Coherent and Normal Rate  Volume:  Normal  Mood: "very very good.."  Affect:  Unable to assess since appointment was on telephone  Thought Process:  Goal Directed and Linear  Orientation:  Full (Time, Place, and Person)  Thought Content: Logical    Suicidal Thoughts:  No  Homicidal Thoughts:  No  Memory:  Immediate;   Fair Recent;   Fair Remote;   Fair  Judgement:  Good  Insight:  Good  Psychomotor Activity:  Unable to assess since appointment was on telephone  Concentration:  Concentration: Good and Attention Span: Good  Recall:  Good  Fund of Knowledge: Good  Language: Good  Akathisia:  No    AIMS (if indicated): not done  Assets:  Communication Skills Desire for Improvement Financial Resources/Insurance Housing Leisure Time Physical Health Social Support Transportation Vocational/Educational  ADL's:  Intact  Cognition: WNL  Sleep:  Good     Screenings: AIMS   Flowsheet Row Admission (Discharged) from 05/13/2017 in [place] Total Score 0    AUDIT   [place] Admission (Discharged) from 07/27/2020 in [place] [facility] Admission (Discharged) from 05/13/2017 in [place]  Alcohol Use Disorder Identification Test Final Score (AUDIT) 6 0    GAD-7   Flowsheet Row Office Visit from 04/05/2020 in [place] at [facility]  Total GAD-7 Score 7    PHQ2-9   Flowsheet Row Counselor from 11/23/2020 in [place] from 07/26/2020 in [place] from 06/07/2020 in [place] from 04/05/2020 in [place] at [place] from 06/19/2017 in [place] at [place]  PHQ-2 Total Score 0 5 0 1 0  PHQ-9 Total Score -- 22 7 7 4     Flowsheet Row Counselor from 12/20/2020 in [place] from 11/23/2020 in [place] Admission (Discharged) from 07/27/2020 in [place] [facility]  C-SSRS RISK CATEGORY No Risk No Risk High Risk       Assessment and Plan:   Nicki is a 25 yr old Caucasian female who is  single, employed, has a history of bipolar disorder, generalized anxiety disorder, insomnia evaluated today for a follow-up visit for medication management. She reported worsening of anxiety in the context of new psychosocial stressors at work and was recommended to increase the dose of Luvox for anxiety and mood recently.  She reports continued stability in  mood and anxiety since then and therefore recommending to continue with current medications.  Has not been using MJA recently, and reports not using alcohol. Reviewed response to current medication and recommneding medications as below.   Plan as below.  Reviewed on 12/30/20  Plan  Bipolar 2 disorder (chronic, stable) Continue with lamotrigine  150 mg daily.  Continue Luvox  200 mg p.o. daily Continue Abilify at10mg  po qd.  For generalized anxiety disorder-(chronic and worse) -  ind therapy at [facility],  Luvox as mentioned above.  Medications above will be helpful in decreasing anxiety. Previously tried Prozac upto 10 mg daily and Zoloft 150 mg daily - stopped due to inefficacy.   For insomnia-chronic and stable Continue trazodone150mg  p.o. nightly Can take Atarax 25-50 mg QHS PRN for sleep.      Victoria Erm, MD 12/30/2020, 10:20 AM

## 2021-01-07 ENCOUNTER — Other Ambulatory Visit (HOSPITAL_COMMUNITY): Payer: Self-pay

## 2021-01-11 ENCOUNTER — Other Ambulatory Visit: Payer: Self-pay

## 2021-01-11 ENCOUNTER — Ambulatory Visit (INDEPENDENT_AMBULATORY_CARE_PROVIDER_SITE_OTHER): Payer: No Typology Code available for payment source | Admitting: Licensed Clinical Social Worker

## 2021-01-11 DIAGNOSIS — F3181 Bipolar II disorder: Secondary | ICD-10-CM | POA: Diagnosis not present

## 2021-01-11 NOTE — Progress Notes (Signed)
Virtual Visit via Video Note  I connected with Victoria Simmons on 01/11/21 at 10:00 AM EDT by a video enabled telemedicine application and verified that I am speaking with the correct person using two identifiers.  Location: Patient: home Provider: [facility]   I discussed the limitations of evaluation and management by telemedicine and the availability of in person appointments. The patient expressed understanding and agreed to proceed.  I discussed the assessment and treatment plan with the patient. The patient was provided an opportunity to ask questions and all were answered. The patient agreed with the plan and demonstrated an understanding of the instructions.   The patient was advised to call back or seek an in-person evaluation if the symptoms worsen or if the condition fails to improve as anticipated.  I provided 20 minutes of non-face-to-face time during this encounter.   Jahniyah Revere R Adonis Yim, LCSW    THERAPIST PROGRESS NOTE  Session Time: 10-10:20a  Participation Level: Active  Behavioral Response: Neat and Well GroomedAlertEuthymic  Type of Therapy: Individual Therapy  Treatment Goals addressed: Anxiety  Interventions: Supportive  Summary: Victoria Simmons is a 25 y.o. female who presents with symptoms consistent with bipolar disorder and anxiety. Pt reporting that overall mood has been stable and that pt has not been experiencing much anxiety/stress recently. Pt reports that if she does have situational anxiety, she is able to manage it in the moment. Pt reports that overall work environment has improved significantly, and that she feels well supported by staff and is actually enjoying working.  Pt reports that family relationships are good and that she is looking forward to trips scheduled in the future. Pt reports that she is spending good quality time with friends regularly--at least two times per week.  Pt is in a new relationship and is very happy with this new  relationship "my family just love her".   Pt reports that she is compliant with medication.  Continued recommendations are as follows: self care behaviors, positive social engagements, focusing on overall work/home/life balance, and focusing on positive physical and emotional wellness.   Suicidal/Homicidal: No  Therapist Response: Victoria Simmons is continuing to identify and articulate specific current and past experiences with fears, worries, and anxiety symptoms including their impact on functioning and ways that Victoria Simmons is managing symptoms to limit impact on functioning. Victoria Simmons is increasing daily social and vocational activities and developing healthy interpersonal relationships. Victoria Simmons is being intentional about replacing fearful self talk with positive and empowering self talk.  These behaviors are reflective of continuing progress. Focus on maintaining levels of stability. Treatment to continue.  Plan: Return again in 3 weeks.  Diagnosis: Axis I: Bipolar, Depressed    Axis II: No diagnosis    [place], LCSW 01/11/2021

## 2021-01-19 ENCOUNTER — Emergency Department
Admission: EM | Admit: 2021-01-19 | Discharge: 2021-01-19 | Disposition: A | Discharge status: Home / Self Care | Payer: No Typology Code available for payment source | Attending: Emergency Medicine | Admitting: Emergency Medicine

## 2021-01-19 ENCOUNTER — Other Ambulatory Visit: Payer: Self-pay

## 2021-01-19 ENCOUNTER — Emergency Department: Payer: No Typology Code available for payment source

## 2021-01-19 DIAGNOSIS — Z79899 Other long term (current) drug therapy: Secondary | ICD-10-CM | POA: Insufficient documentation

## 2021-01-19 DIAGNOSIS — Y92410 Unspecified street and highway as the place of occurrence of the external cause: Secondary | ICD-10-CM | POA: Insufficient documentation

## 2021-01-19 DIAGNOSIS — M25512 Pain in left shoulder: Secondary | ICD-10-CM | POA: Diagnosis present

## 2021-01-19 DIAGNOSIS — M546 Pain in thoracic spine: Secondary | ICD-10-CM | POA: Insufficient documentation

## 2021-01-19 DIAGNOSIS — M542 Cervicalgia: Secondary | ICD-10-CM | POA: Diagnosis not present

## 2021-01-19 MED ORDER — METHOCARBAMOL 500 MG PO TABS: 500.0000 mg | ORAL_TABLET | Freq: Three times a day (TID) | ORAL | 0 refills | Status: AC | PRN

## 2021-01-19 MED ORDER — MELOXICAM 15 MG PO TABS: 15.0000 mg | ORAL_TABLET | Freq: Every day | ORAL | 2 refills | Status: DC

## 2021-01-19 MED ORDER — KETOROLAC TROMETHAMINE 30 MG/ML IJ SOLN
30.0000 mg | Freq: Once | INTRAMUSCULAR | Status: AC
  Administered 2021-01-19: 30 mg via INTRAMUSCULAR
  Filled 2021-01-19: qty 1

## 2021-01-19 NOTE — Discharge Instructions (Signed)
Take Meloxicam and Robaxin as directed.
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
## 2021-01-19 NOTE — ED Notes (Signed)
Pt in xray
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
## 2021-01-25 ENCOUNTER — Ambulatory Visit
Admit: 2021-01-25 | Disposition: A | Discharge status: Home / Self Care | Payer: No Typology Code available for payment source

## 2021-01-25 ENCOUNTER — Encounter: Payer: Self-pay | Admitting: Family Medicine

## 2021-01-26 ENCOUNTER — Ambulatory Visit (INDEPENDENT_AMBULATORY_CARE_PROVIDER_SITE_OTHER): Payer: No Typology Code available for payment source | Admitting: Family Medicine

## 2021-01-26 ENCOUNTER — Other Ambulatory Visit: Payer: Self-pay

## 2021-01-26 ENCOUNTER — Encounter: Payer: Self-pay | Admitting: Family Medicine

## 2021-01-26 VITALS — BP 80/60 | HR 85 | Temp 97.5°F | Ht 65.0 in | Wt 193.0 lb

## 2021-01-26 DIAGNOSIS — S161XXD Strain of muscle, fascia and tendon at neck level, subsequent encounter: Secondary | ICD-10-CM | POA: Insufficient documentation
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
## 2021-02-10 ENCOUNTER — Other Ambulatory Visit (HOSPITAL_COMMUNITY): Payer: Self-pay

## 2021-02-10 ENCOUNTER — Telehealth (INDEPENDENT_AMBULATORY_CARE_PROVIDER_SITE_OTHER): Payer: No Typology Code available for payment source | Admitting: Child and Adolescent Psychiatry

## 2021-02-10 ENCOUNTER — Other Ambulatory Visit: Payer: Self-pay

## 2021-02-10 DIAGNOSIS — F411 Generalized anxiety disorder: Secondary | ICD-10-CM

## 2021-02-10 DIAGNOSIS — F3181 Bipolar II disorder: Secondary | ICD-10-CM

## 2021-02-10 MED ORDER — ARIPIPRAZOLE 10 MG PO TABS
10.0000 mg | ORAL_TABLET | Freq: Every day | ORAL | 1 refills | Status: DC
  Filled 2021-02-10: qty 30, 30d supply, fill #0
  Filled 2021-03-13: qty 30, 30d supply, fill #1

## 2021-02-10 NOTE — Progress Notes (Signed)
Virtual Visit via Telephone Note  I connected with Victoria Simmons on 02/10/21 at 10:00 AM EDT by telephone and verified that I am speaking with the correct person using two identifiers.  Location: Patient: home Provider: office   I discussed the limitations, risks, security and privacy concerns of performing an evaluation and management service by telephone and the availability of in person appointments. I also discussed with the patient that there may be a patient responsible charge related to this service. The patient expressed understanding and agreed to proceed.  I discussed the assessment and treatment plan with the patient. The patient was provided an opportunity to ask questions and all were answered. The patient agreed with the plan and demonstrated an understanding of the instructions.   The patient was advised to call back or seek an in-person evaluation if the symptoms worsen or if the condition fails to improve as anticipated.  I provided 15 minutes of non-face-to-face time during this encounter.   Orlene Erm, MD     [facility] MD/PA/NP OP Progress Note  02/10/2021 10:17 AM Victoria Simmons  MRN:  [id number]  Chief Complaint: Medication management follow-up for bipolar disorder, generalized anxiety disorder and insomnia.  Synopsis: Victoria Simmons is a 25 year old Caucasian female, employed at daycare, with history of bipolar disorder type II, GAD, insomnia was seen by Dr. Einar Grad since 2017 and transition to this writer in April 2020.  She is currently prescribed Lamictal 150 mg once a day, Abilify 10 mg once a day, trazodone 150 mg once a day and Luvox 150 mg once a day.   HPI:   Victoria Simmons was evaluated over telephone encounter for medication management follow-up.  The appointment was scheduled over telemedicine however patient could not connect on the video and therefore she asked to switch his appointment on telephone.  Victoria Simmons reports that she is doing "great", denies any concerns for  today's appointment, reports that her mood has been "steady", denies any high highs or low lows, reports some anxiety regarding work but not as bad as it used to before, reports sleeping and eating well, denies any SI/HI.  She reports that she has been eating better and continues to go to gym about 3 times a week.  She reports that she has been compliant to her medications without any side effects.  She reports that she continues to see her therapist about once a month.  When asked what has been helping her with her mood and anxiety she reports that she is in a very supportive relationship with a girlfriend and they are currently living together at her father's home.  We discussed to continue with current medication management and follow-up in 6 to 8 weeks or earlier if needed.  Patient verbalized understanding and agreed with the plan.  She reports that she has refills on all her medications except Abilify and asked to send a prescription for that. Visit Diagnosis:    ICD-10-CM   1. GAD (generalized anxiety disorder)  F41.1   2. Bipolar 2 disorder, major depressive episode (HCC)  F31.81 ARIPiprazole (ABILIFY) 10 MG tablet    Past Psychiatric History: PPHx reviewed today and as followin. 2 previous inpatient admission at [facility] at the age of 67, and last at [facility], no previous suicide attempts, has history of cutting and her past medication trials include Prozac and Zoloft.  Decreasing Luvox to 50 mg worsened her symptoms of anxiety therefore it was increased back to 100 mg once a day.   Previous trials  of Zoloft 150 mg and Prozac 10 mg stopped due to inefficacy.   Past Medical History:  Past Medical History:  Diagnosis Date  . Anxiety   . Bipolar 2 disorder ([place])   . Depression   . Dysmenorrhea   . Hypertriglyceridemia 2020  . MDD (major depressive disorder) 06/19/2017  . Ovarian torsion 2008    Past Surgical History:  Procedure Laterality Date  . OVARY SURGERY  06/30/2007   dx  laparoscopy, reduction of right adnexal torsion, bilateral oophoropexy of each ovary to posterior uterine fundus    Family Psychiatric History: As mentioned in initial H&P, reviewed today, no change Family History:  Family History  Problem Relation Age of Onset  . Hepatitis B Mother   . Anxiety disorder Mother   . Hypertension Father   . ADD / ADHD Brother   . Ovarian cancer Paternal Grandmother     Social History:  Social History   Socioeconomic History  . Marital status: Single    Spouse name: Not on file  . Number of children: 0  . Years of education: Not on file  . Highest education level: Some college, no degree  Occupational History  . Occupation: [employer]    Comment: full time  Tobacco Use  . Smoking status: Never Smoker  . Smokeless tobacco: Never Used  Vaping Use  . Vaping Use: Former  Substance and Sexual Activity  . Alcohol use: Yes    Comment: last drink on Tuesday,: pt had a glass of wine, drinks twice a month (5-7 drinks)  . Drug use: Yes    Types: Marijuana    Comment: last use 16 Aug 2020  . Sexual activity: Not Currently  Other Topics Concern  . Not on file  Social History Narrative          Social Determinants of Health   Financial Resource Strain: Not on file  Food Insecurity: Not on file  Transportation Needs: Not on file  Physical Activity: Not on file  Stress: Not on file  Social Connections: Not on file    Allergies: No Known Allergies  Metabolic Disorder Labs: Lab Results  Component Value Date   HGBA1C 5.0 07/26/2020   MPG 97 07/26/2020   MPG 94 05/14/2017   No results found for: PROLACTIN Lab Results  Component Value Date   CHOL 226 (H) 07/26/2020   TRIG 138 07/26/2020   HDL 44 07/26/2020   CHOLHDL 5.1 07/26/2020   VLDL 28 07/26/2020   LDLCALC 154 (H) 07/26/2020   LDLCALC 103 (H) 04/05/2020   Lab Results  Component Value Date   TSH 2.401 07/26/2020   TSH 2.64 04/05/2020    Therapeutic Level Labs: No  results found for: LITHIUM No results found for: VALPROATE No components found for:  CBMZ  Current Medications: Current Outpatient Medications  Medication Sig Dispense Refill  . ARIPiprazole (ABILIFY) 10 MG tablet TAKE 1 TABLET (10 MG TOTAL) BY MOUTH AT BEDTIME. 30 tablet 1  . fluticasone (FLONASE) 50 MCG/ACT nasal spray PLACE 2 SPRAYS INTO BOTH NOSTRILS DAILY. 16 g 0  . fluvoxaMINE (LUVOX) 100 MG tablet TAKE 2 TABLETS (200 MG TOTAL) BY MOUTH AT BEDTIME. 60 tablet 1  . hydrOXYzine (ATARAX/VISTARIL) 25 MG tablet TAKE 1 TABLET (25 MG TOTAL) BY MOUTH 3 (THREE) TIMES DAILY AS NEEDED FOR ANXIETY. 75 tablet 1  . lamoTRIgine (LAMICTAL) 100 MG tablet TAKE 1.5 TABLETS (150 MG TOTAL) BY MOUTH AT BEDTIME. 45 tablet 1  . Levonorgestrel (KYLEENA IU) by  Intrauterine route.    . meloxicam (MOBIC) 15 MG tablet Take 1 tablet (15 mg total) by mouth daily. 30 tablet 2  . ofloxacin (FLOXIN) 0.3 % OTIC solution PLACE 10 DROPS INTO BOTH EARS DAILY FOR 7 DAYS. 10 mL 0  . pantoprazole (PROTONIX) 40 MG tablet TAKE 1 TABLET (40 MG TOTAL) BY MOUTH 2 (TWO) TIMES DAILY. 60 tablet 3  . Sodium Sulfate-Mag Sulfate-KCl [phone] MG TABS TAKE AS DIRECTED 24 tablet 0  . sucralfate (CARAFATE) 1 g tablet TAKE 1 TABLET (1 G TOTAL) BY MOUTH EVERY 6 (SIX) HOURS. 30 tablet 3  . traZODone (DESYREL) 100 MG tablet TAKE 1.5 TABLETS (150 MG TOTAL) BY MOUTH AT BEDTIME. 45 tablet 1   No current facility-administered medications for this visit.     Musculoskeletal: Strength & Muscle Tone: unable to assess since visit was over the telemedicine. Gait & Station: unable to assess since visit was over the telemedicine. Patient leans: N/A  Psychiatric Specialty Exam: ROSReview of 12 systems negative except as mentioned in HPI   There were no vitals taken for this visit.There is no height or weight on file to calculate BMI.  General Appearance: Unable to assess since appointment was on telephone  Eye Contact:  Unable to assess since  appointment was on telephone  Speech:  Clear and Coherent and Normal Rate  Volume:  Normal  Mood: "great.."  Affect:  Unable to assess since appointment was on telephone  Thought Process:  Goal Directed and Linear  Orientation:  Full (Time, Place, and Person)  Thought Content: Logical   Suicidal Thoughts:  No  Homicidal Thoughts:  No  Memory:  Immediate;   Fair Recent;   Fair Remote;   Fair  Judgement:  Good  Insight:  Good  Psychomotor Activity:  Unable to assess since appointment was on telephone  Concentration:  Concentration: Good and Attention Span: Good  Recall:  Good  Fund of Knowledge: Good  Language: Good  Akathisia:  No    AIMS (if indicated): not done  Assets:  Communication Skills Desire for Improvement Financial Resources/Insurance Housing Leisure Time Physical Health Social Support Transportation Vocational/Educational  ADL's:  Intact  Cognition: WNL  Sleep:  Good     Screenings: AIMS   Flowsheet Row Admission (Discharged) from 05/13/2017 in [place] Total Score 0    AUDIT   [place] Admission (Discharged) from 07/27/2020 in [place] [facility] Admission (Discharged) from 05/13/2017 in [place]  Alcohol Use Disorder Identification Test Final Score (AUDIT) 6 0    GAD-7   Flowsheet Row Office Visit from 04/05/2020 in [place] at [facility]  Total GAD-7 Score 7    PHQ2-9   Flowsheet Row Counselor from 11/23/2020 in [place] from 07/26/2020 in [place] from 06/07/2020 in [place] Visit from 04/05/2020 in [facility] from 06/19/2017 in [place] at [place]  PHQ-2 Total Score 0 5 0 1 0  PHQ-9 Total Score -- 22 7 7 4     Flowsheet Row ED from 01/19/2021 in [place] Counselor from 12/20/2020 in [place] Counselor from 11/23/2020 in [place] No Risk No Risk No Risk       Assessment and Plan:   Victoria Simmons is a 25 yr old Caucasian female who is single, employed, has  a history of bipolar disorder, generalized anxiety disorder, insomnia evaluated today for a follow-up visit for medication management.  She reports continued stability in mood and anxiety and therefore recommending to continue with current medications.  Has not been using MJA recently, and reports not using alcohol. Reviewed response to current medication and recommneding medications as below.   Plan as below.  Reviewed on 02/10/21  Plan  Bipolar 2 disorder (chronic, stable) Continue with lamotrigine  150 mg daily.  Continue Luvox  200 mg p.o. daily Continue Abilify at10mg  po qd.  For generalized anxiety disorder-(chronic and worse) -  ind therapy at [facility],  Luvox as mentioned above.  Medications above will be helpful in decreasing anxiety. Previously tried Prozac upto 10 mg daily and Zoloft 150 mg daily - stopped due to inefficacy.   For insomnia-chronic and stable Continue trazodone150mg  p.o. nightly Can take Atarax 25-50 mg QHS PRN for sleep.     This note was generated in part or whole with voice recognition software. Voice recognition is usually quite accurate but there are transcription errors that can and very often do occur. I apologize for any typographical errors that were not detected and corrected.  Orlene Erm, MD 02/10/2021, 10:17 AM

## 2021-02-15 ENCOUNTER — Other Ambulatory Visit: Payer: Self-pay

## 2021-02-15 ENCOUNTER — Other Ambulatory Visit (HOSPITAL_COMMUNITY): Payer: Self-pay

## 2021-02-15 ENCOUNTER — Ambulatory Visit (INDEPENDENT_AMBULATORY_CARE_PROVIDER_SITE_OTHER): Payer: No Typology Code available for payment source | Admitting: Licensed Clinical Social Worker

## 2021-02-15 DIAGNOSIS — F3181 Bipolar II disorder: Secondary | ICD-10-CM

## 2021-02-15 DIAGNOSIS — F411 Generalized anxiety disorder: Secondary | ICD-10-CM | POA: Diagnosis not present

## 2021-02-15 NOTE — Progress Notes (Signed)
Virtual Visit via Video Note  I connected with [name] on 02/15/21 at 10:00 AM EDT by a video enabled telemedicine application and verified that I am speaking with the correct person using two identifiers.  Location: Patient: home Provider: [facility]   I discussed the limitations of evaluation and management by telemedicine and the availability of in person appointments. The patient expressed understanding and agreed to proceed.   I discussed the assessment and treatment plan with the patient. The patient was provided an opportunity to ask questions and all were answered. The patient agreed with the plan and demonstrated an understanding of the instructions.   The patient was advised to call back or seek an in-person evaluation if the symptoms worsen or if the condition fails to improve as anticipated.  I provided 30 minutes of non-face-to-face time during this encounter.   Mccrae Speciale R Gilman Olazabal, LCSW    THERAPIST PROGRESS NOTE  Session Time: 10-10:30a  Participation Level: Active  Behavioral Response: NeatAlertEuthymic  Type of Therapy: Individual Therapy  Treatment Goals addressed: Coping  Interventions: CBT and Supportive  Summary: Victoria Simmons is a 25 y.o. female who presents with improving symptoms related to bipolar disorder diagnosis. Pt reports that overall mood has been stable and that she is managing situational stressors well. Pt reports that she is compliant with medication and that she is getting good quality and quantity of sleep.  Allowed pt to explore and express thoughts and feelings related to current life events. Pt reports that current work environment is stressful, but pt is managing the stress better than she has in the past. Pt is trying to be more assertive with her needs at work and her supervisor has been very supportive.    Pt reporting that current romantic relationship is going well--pt and girlfriend have decided to get married next month.   Allowed pt to explore how this decision was made and weigh out pros/cons. Discussed some of the major discussions to have prior to making this decision: children, finances, goals. Pt reports that they have discussed all of these things in detail and are on the same page about it. Pt feels supported by friends and family and is comfortable with her decision.  Pt reports that she is also continuing to work on relationship with her brother.   Reviewed coping skills that pt has found helpful in the past and encouraged her to continue utilizing to maintain levels of progress.  Continued recommendations are as follows: self care behaviors, positive social engagements, focusing on overall work/home/life balance, and focusing on positive physical and emotional wellness.    Suicidal/Homicidal: No  Therapist Response: Justesis continuing to identify and articulate specific current and past experiences with fears, worries, and anxiety symptoms including their impact on functioning and ways that Victoria Simmons is managing symptoms to limit impact on functioning. Marguerite is increasing daily social and vocational activities and developing healthy interpersonal relationships. Agatha is being intentional about replacing fearful self talk with positive and empowering self talk. These behaviors are reflective of continuing progress. Focus on maintaining levels of stability. Treatment to continue.  Plan: Return again in 4 weeks.  Diagnosis: Axis I: Bipolar, Depressed and Generalized Anxiety Disorder    Axis II: No diagnosis    [place], LCSW 02/15/2021

## 2021-02-16 ENCOUNTER — Telehealth (INDEPENDENT_AMBULATORY_CARE_PROVIDER_SITE_OTHER): Payer: No Typology Code available for payment source | Admitting: Family Medicine

## 2021-02-16 ENCOUNTER — Encounter: Payer: Self-pay | Admitting: Family Medicine

## 2021-02-16 VITALS — Wt 195.0 lb

## 2021-02-16 DIAGNOSIS — K529 Noninfective gastroenteritis and colitis, unspecified: Secondary | ICD-10-CM

## 2021-02-16 NOTE — Progress Notes (Signed)
    I connected with Victoria Simmons on 02/16/21 at  9:00 AM EDT by video and verified that I am speaking with the correct person using two identifiers.   I discussed the limitations, risks, security and privacy concerns of performing an evaluation and management service by video and the availability of in person appointments. I also discussed with the patient that there may be a patient responsible charge related to this service. The patient expressed understanding and agreed to proceed.  Patient location: parked care Provider Location: [place] Participants: Lesleigh Noe and Ovidio Kin   Subjective:     Victoria Simmons is a 25 y.o. 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  HENT: Negative for congestion and rhinorrhea.   Respiratory: Negative for cough and shortness of breath.   Gastrointestinal: Positive for diarrhea, nausea and vomiting.  Musculoskeletal: Negative for arthralgias and myalgias.     Social History   Tobacco Use  Smoking Status Never Smoker  Smokeless Tobacco Never Used        Objective:   BP Readings from Last 3 Encounters:  01/26/21 (!) 80/60  01/19/21 108/74  08/25/20 (!) 91/55   Wt Readings from Last 3 Encounters:  02/16/21 195 lb (88.5 kg)  01/26/21 193 lb (87.5 kg)  01/19/21 195 lb (88.5 kg)    Wt 195 lb (88.5 kg)   BMI 32.45 kg/m    Physical Exam Constitutional:      Appearance: Normal appearance. She is not  ill-appearing.  HENT:     Head: Normocephalic and atraumatic.     Right Ear: External ear normal.     Left Ear: External ear normal.  Eyes:     Conjunctiva/sclera: Conjunctivae normal.  Pulmonary:     Effort: Pulmonary effort is normal. No respiratory distress.  Neurological:     Mental Status: She is alert. Mental status is at baseline.  Psychiatric:        Mood and Affect: Mood normal.        Behavior: Behavior normal.        Thought Content: Thought content normal.        Judgment: Judgment normal.            Assessment & Plan:   Problem List Items Addressed This Visit   None   Visit Diagnoses    Gastroenteritis    -  Primary     Suspect viral gastroenteritis Encouraged hydration Work not provided ER/UC precautions for abdominal pain  Call if zofran not at home and will send refill to help keep food/water down  Return if symptoms worsen or fail to improve.  Lesleigh Noe, MD

## 2021-02-20 ENCOUNTER — Other Ambulatory Visit (HOSPITAL_COMMUNITY): Payer: Self-pay

## 2021-02-20 MED FILL — Lamotrigine Tab 100 MG: ORAL | 30 days supply | Qty: 45 | Fill #0 | Status: AC

## 2021-03-01 ENCOUNTER — Other Ambulatory Visit (HOSPITAL_COMMUNITY): Payer: Self-pay

## 2021-03-01 MED FILL — Fluvoxamine Maleate Tab 100 MG: ORAL | 30 days supply | Qty: 60 | Fill #0 | Status: AC

## 2021-03-13 ENCOUNTER — Other Ambulatory Visit (HOSPITAL_COMMUNITY): Payer: Self-pay

## 2021-03-22 ENCOUNTER — Ambulatory Visit (INDEPENDENT_AMBULATORY_CARE_PROVIDER_SITE_OTHER): Payer: No Typology Code available for payment source | Admitting: Licensed Clinical Social Worker

## 2021-03-22 ENCOUNTER — Other Ambulatory Visit: Payer: Self-pay

## 2021-03-22 DIAGNOSIS — Z5329 Procedure and treatment not carried out because of patient's decision for other reasons: Secondary | ICD-10-CM

## 2021-03-22 NOTE — Progress Notes (Signed)
LCSW counselor tried to connect with patient for scheduled appointment via MyChart video text request x 2 and email request; also tried to connect via phone without success. LCSW counselor left message for patient to call office number to reschedule OPT appointment.

## 2021-03-23 ENCOUNTER — Ambulatory Visit (INDEPENDENT_AMBULATORY_CARE_PROVIDER_SITE_OTHER): Payer: No Typology Code available for payment source | Admitting: Licensed Clinical Social Worker

## 2021-03-23 ENCOUNTER — Other Ambulatory Visit (HOSPITAL_COMMUNITY): Payer: Self-pay

## 2021-03-23 ENCOUNTER — Other Ambulatory Visit: Payer: Self-pay

## 2021-03-23 DIAGNOSIS — F3181 Bipolar II disorder: Secondary | ICD-10-CM

## 2021-03-23 DIAGNOSIS — F411 Generalized anxiety disorder: Secondary | ICD-10-CM | POA: Diagnosis not present

## 2021-03-23 MED FILL — Trazodone HCl Tab 100 MG: ORAL | 30 days supply | Qty: 45 | Fill #0 | Status: AC

## 2021-03-23 NOTE — Progress Notes (Signed)
Virtual Visit via Video Note  I connected with Victoria Simmons on 03/23/21 at 10:00 AM EDT by a video enabled telemedicine application and verified that I am speaking with the correct person using two identifiers.  Location: Patient: home Provider: remote office Victoria Simmons, Victoria Simmons)   I discussed the limitations of evaluation and management by telemedicine and the availability of in person appointments. The patient expressed understanding and agreed to proceed.  I discussed the assessment and treatment plan with the patient. The patient was provided an opportunity to ask questions and all were answered. The patient agreed with the plan and demonstrated an understanding of the instructions.   The patient was advised to call back or seek an in-person evaluation if the symptoms worsen or if the condition fails to improve as anticipated.  I provided 30 minutes of non-face-to-face time during this encounter.   [place], LCSW   THERAPIST PROGRESS NOTE  Session Time: 10:30-11a  Participation Level: Active  Behavioral Response: NAAlertAnxious  Type of Therapy: Individual Therapy  Treatment Goals addressed: Anxiety  Interventions: CBT, Motivational Interviewing, and Solution Focused  Summary: Victoria Simmons is a 25 y.o. female who presents with improving symptoms related to bipolar disorder and anxiety. Pt reports that she has been compliant with medication and that overall mood has been stable. Pt is reporting good quality and quantity of sleep.  Allowed pt to explore and express thoughts and feelings associated with recent life situations and external stressors. Pt shared that she recently got married to partner, Victoria Simmons. Pt feels Victoria Simmons is a good fit for her family.  Allowed pt to express thoughts and feelings about relationship.  Victoria Simmons also reports that she recently quit her job and is seeking employment elsewhere "it just got to be too much--I was stressed and depressed every  day".  Pt has some upcoming trips planned and is looking forward to spending the quality time with Victoria Simmons and her family.  Reviewed coping skills that are currently working well for Victoria Simmons and encouraged her to continue using to maintain current levels of progress and stability.  Continued recommendations are as follows: self care behaviors, positive social engagements, focusing on overall work/home/life balance, and focusing on positive physical and emotional wellness.    Suicidal/Homicidal: No  Therapist Response: Victoria Simmons is continuing to identify and articulate specific current and past experiences with fears, worries, and anxiety symptoms including their impact on functioning and ways that Victoria Simmons is managing symptoms to limit impact on functioning. Antrice is increasing daily social and vocational activities and developing healthy interpersonal relationships. Simmons is being intentional about replacing fearful self talk with positive and empowering self talk.  These behaviors are reflective of continuing progress. Focus on maintaining levels of stability. Treatment to continue.  Plan: Return again in 4 weeks.  Diagnosis: Axis I: Bipolar, Depressed and Generalized Anxiety Disorder    Axis II: No diagnosis    Victoria Bo Reem Fleury, LCSW 03/23/2021

## 2021-03-24 ENCOUNTER — Ambulatory Visit: Payer: No Typology Code available for payment source | Admitting: Licensed Clinical Social Worker

## 2021-03-27 ENCOUNTER — Other Ambulatory Visit (HOSPITAL_COMMUNITY): Payer: Self-pay

## 2021-03-27 ENCOUNTER — Other Ambulatory Visit: Payer: Self-pay | Admitting: Child and Adolescent Psychiatry

## 2021-03-27 DIAGNOSIS — F3181 Bipolar II disorder: Secondary | ICD-10-CM

## 2021-03-27 DIAGNOSIS — F411 Generalized anxiety disorder: Secondary | ICD-10-CM

## 2021-03-28 ENCOUNTER — Other Ambulatory Visit (HOSPITAL_COMMUNITY): Payer: Self-pay

## 2021-03-28 MED ORDER — FLUVOXAMINE MALEATE 100 MG PO TABS
200.0000 mg | ORAL_TABLET | Freq: Every evening | ORAL | 1 refills | Status: DC
Start: 2021-03-28 — End: 2021-06-15
  Filled 2021-03-28: qty 60, 30d supply, fill #0
  Filled 2021-05-06: qty 60, 30d supply, fill #1

## 2021-03-28 MED ORDER — LAMOTRIGINE 100 MG PO TABS
150.0000 mg | ORAL_TABLET | Freq: Every day | ORAL | 1 refills | Status: DC
  Filled 2021-03-28: qty 45, 30d supply, fill #0
  Filled 2021-05-06: qty 45, 30d supply, fill #1

## 2021-03-31 ENCOUNTER — Encounter: Payer: Self-pay | Admitting: Child and Adolescent Psychiatry

## 2021-03-31 ENCOUNTER — Other Ambulatory Visit: Payer: Self-pay

## 2021-03-31 ENCOUNTER — Other Ambulatory Visit (HOSPITAL_COMMUNITY): Payer: Self-pay

## 2021-03-31 ENCOUNTER — Telehealth (INDEPENDENT_AMBULATORY_CARE_PROVIDER_SITE_OTHER): Payer: No Typology Code available for payment source | Admitting: Child and Adolescent Psychiatry

## 2021-03-31 DIAGNOSIS — F3181 Bipolar II disorder: Secondary | ICD-10-CM | POA: Diagnosis not present

## 2021-03-31 DIAGNOSIS — F411 Generalized anxiety disorder: Secondary | ICD-10-CM

## 2021-03-31 MED ORDER — ARIPIPRAZOLE 10 MG PO TABS
10.0000 mg | ORAL_TABLET | Freq: Every day | ORAL | 1 refills | Status: DC
  Filled 2021-03-31 – 2021-04-20 (×2): qty 30, 30d supply, fill #0
  Filled 2021-05-23: qty 30, 30d supply, fill #1

## 2021-03-31 MED ORDER — TRAZODONE HCL 100 MG PO TABS
150.0000 mg | ORAL_TABLET | Freq: Every day | ORAL | 1 refills | Status: DC
  Filled 2021-03-31: qty 45, fill #0
  Filled 2021-05-08: qty 45, 30d supply, fill #0
  Filled 2021-06-12: qty 45, 30d supply, fill #1

## 2021-03-31 NOTE — Progress Notes (Signed)
Virtual Visit via Video Note  I connected with Victoria Simmons on 03/31/21 at 10:00 AM EDT by a video enabled telemedicine application and verified that I am speaking with the correct person using two identifiers.  Location: Patient: home Provider: office   I discussed the limitations of evaluation and management by telemedicine and the availability of in person appointments. The patient expressed understanding and agreed to proceed.   I discussed the assessment and treatment plan with the patient. The patient was provided an opportunity to ask questions and all were answered. The patient agreed with the plan and demonstrated an understanding of the instructions.   The patient was advised to call back or seek an in-person evaluation if the symptoms worsen or if the condition fails to improve as anticipated.  I provided 15 minutes of non-face-to-face time during this encounter.   Orlene Erm, MD      [facility] MD/PA/NP OP Progress Note  03/31/2021 10:20 AM Victoria Simmons  MRN:  [id number]  Chief Complaint: Medication management follow-up for bipolar disorder, generalized anxiety disorder and insomnia.  Synopsis: Victoria Simmons is a 25 year old Caucasian female, employed at daycare, with history of bipolar disorder type II, GAD, insomnia was seen by Dr. Einar Simmons since 2017 and transition to this writer in April 2020.  She is currently prescribed Lamictal 150 mg once a day, Abilify 10 mg once a day, trazodone 150 mg once a day and Luvox 150 mg once a day.   HPI: Victoria Simmons was seen and evaluated over telemedicine encounter for medication management follow-up.  She reports that in the interim since the last appointment she got married to her wife.  She reports that it went very well and she has been living with her at her parents home at this time.  She reports that she has been doing very well since the last appointment, denies having any low lows or high highs, denies feeling depressed, has some anxiety  intermittently but not as bad as it used to before.  She denies any SI/HI.  She denies any concerns for today's appointment.  She reports that she has been compliant to her medications and denies any side effects from them.  She reports that she has left the job at daycare and currently looking for a job.  She reports that daycare was too stressful for her and therefore she decided to quit.  She reports that she is currently supported by her wife's job.  She denies any marijuana use or nicotine use however reports that she drinks about 1 or 2 drinks whenever she goes out which is not frequent.  She continues to see her therapist about every 3 to 4 weeks.  We discussed to continue with current medications and follow-up in 2 months or earlier if needed.  We discussed to get blood work done at the next appointment when she comes in person and asked her to come in empty stomach in the morning.  She verbalized understanding and agreed with the plan.  Visit Diagnosis:    ICD-10-CM   1. Bipolar 2 disorder, major depressive episode (HCC)  F31.81 ARIPiprazole (ABILIFY) 10 MG tablet    2. GAD (generalized anxiety disorder)  F41.1       Past Psychiatric History: PPHx reviewed today and as followin. 2 previous inpatient admission at [facility] at the age of 31, and last at [facility], no previous suicide attempts, has history of cutting and her past medication trials include Prozac and Zoloft.  Decreasing Luvox to  50 mg worsened her symptoms of anxiety therefore it was increased back to 100 mg once a day.   Previous trials of Zoloft 150 mg and Prozac 10 mg stopped due to inefficacy.   Past Medical History:  Past Medical History:  Diagnosis Date   Anxiety    Bipolar 2 disorder ([place])    Depression    Dysmenorrhea    Hypertriglyceridemia 2020   MDD (major depressive disorder) 06/19/2017   Ovarian torsion 2008    Past Surgical History:  Procedure Laterality Date   OVARY SURGERY  06/30/2007   dx laparoscopy,  reduction of right adnexal torsion, bilateral oophoropexy of each ovary to posterior uterine fundus    Family Psychiatric History: As mentioned in initial H&P, reviewed today, no change Family History:  Family History  Problem Relation Age of Onset   Hepatitis B Mother    Anxiety disorder Mother    Hypertension Father    ADD / ADHD Brother    Ovarian cancer Paternal Grandmother     Social History:  Social History   Socioeconomic History   Marital status: Single    Spouse name: Not on file   Number of children: 0   Years of education: Not on file   Highest education level: Some college, no degree  Occupational History   Occupation: [employer]    Comment: full time  Tobacco Use   Smoking status: Never   Smokeless tobacco: Never  Vaping Use   Vaping Use: Former  Substance and Sexual Activity   Alcohol use: Yes    Comment: last drink on Tuesday,: pt had a glass of wine, drinks twice a month (5-7 drinks)   Drug use: Yes    Types: Marijuana    Comment: last use 16 Aug 2020   Sexual activity: Not Currently  Other Topics Concern   Not on file  Social History Narrative          Social Determinants of Health   Financial Resource Strain: Not on file  Food Insecurity: Not on file  Transportation Needs: Not on file  Physical Activity: Not on file  Stress: Not on file  Social Connections: Not on file    Allergies: No Known Allergies  Metabolic Disorder Labs: Lab Results  Component Value Date   HGBA1C 5.0 07/26/2020   MPG 97 07/26/2020   MPG 94 05/14/2017   No results found for: PROLACTIN Lab Results  Component Value Date   CHOL 226 (H) 07/26/2020   TRIG 138 07/26/2020   HDL 44 07/26/2020   CHOLHDL 5.1 07/26/2020   VLDL 28 07/26/2020   LDLCALC 154 (H) 07/26/2020   LDLCALC 103 (H) 04/05/2020   Lab Results  Component Value Date   TSH 2.401 07/26/2020   TSH 2.64 04/05/2020    Therapeutic Level Labs: No results found for: LITHIUM No results found  for: VALPROATE No components found for:  CBMZ  Current Medications: Current Outpatient Medications  Medication Sig Dispense Refill   ARIPiprazole (ABILIFY) 10 MG tablet Take 1 tablet (10 mg total) by mouth at bedtime. 30 tablet 1   fluticasone (FLONASE) 50 MCG/ACT nasal spray PLACE 2 SPRAYS INTO BOTH NOSTRILS DAILY. 16 g 0   fluvoxaMINE (LUVOX) 100 MG tablet Take 2 tablets (200 mg total) by mouth at bedtime. 60 tablet 1   hydrOXYzine (ATARAX/VISTARIL) 25 MG tablet TAKE 1 TABLET (25 MG TOTAL) BY MOUTH 3 (THREE) TIMES DAILY AS NEEDED FOR ANXIETY. 75 tablet 1   lamoTRIgine (LAMICTAL) 100 MG  tablet Take 1.5 tablets (150 mg total) by mouth at bedtime. 45 tablet 1   Levonorgestrel (KYLEENA IU) by Intrauterine route.     meloxicam (MOBIC) 15 MG tablet Take 1 tablet (15 mg total) by mouth daily. 30 tablet 2   ofloxacin (FLOXIN) 0.3 % OTIC solution PLACE 10 DROPS INTO BOTH EARS DAILY FOR 7 DAYS. 10 mL 0   pantoprazole (PROTONIX) 40 MG tablet TAKE 1 TABLET (40 MG TOTAL) BY MOUTH 2 (TWO) TIMES DAILY. 60 tablet 3   Sodium Sulfate-Mag Sulfate-KCl [phone] MG TABS TAKE AS DIRECTED 24 tablet 0   sucralfate (CARAFATE) 1 g tablet TAKE 1 TABLET (1 G TOTAL) BY MOUTH EVERY 6 (SIX) HOURS. 30 tablet 3   traZODone (DESYREL) 100 MG tablet Take 1.5 tablets (150 mg total) by mouth at bedtime. 45 tablet 1   No current facility-administered medications for this visit.     Musculoskeletal: Strength & Muscle Tone: unable to assess since visit was over the telemedicine. Gait & Station: unable to assess since visit was over the telemedicine. Patient leans: N/A  Psychiatric Specialty Exam: ROSReview of 12 systems negative except as mentioned in HPI   There were no vitals taken for this visit.There is no height or weight on file to calculate BMI.  General Appearance: Casual  Eye Contact:  Good  Speech:  Clear and Coherent and Normal Rate  Volume:  Normal  Mood: "good..."  Affect:  Appropriate, Congruent, and  Full Range  Thought Process:  Goal Directed and Linear  Orientation:  Full (Time, Place, and Person)  Thought Content: Logical   Suicidal Thoughts:  No  Homicidal Thoughts:  No  Memory:  Immediate;   Fair Recent;   Fair Remote;   Fair  Judgement:  Good  Insight:  Good  Psychomotor Activity:  Normal  Concentration:  Concentration: Good and Attention Span: Good  Recall:  Good  Fund of Knowledge: Good  Language: Good  Akathisia:  No    AIMS (if indicated): not done  Assets:  Communication Skills Desire for Improvement Financial Resources/Insurance Housing Leisure Time Physical Health Social Support Transportation Vocational/Educational  ADL's:  Intact  Cognition: WNL  Sleep:  Good     Screenings: AIMS    Flowsheet Row Admission (Discharged) from 05/13/2017 in [place] Total Score 0      AUDIT    Flowsheet Row Admission (Discharged) from 07/27/2020 in [facility] [facility] Admission (Discharged) from 05/13/2017 in [place]  Alcohol Use Disorder Identification Test Final Score (AUDIT) 6 0      GAD-7    Flowsheet Row Office Visit from 04/05/2020 in [place] at [facility]  Total GAD-7 Score 7      PHQ2-9    Flowsheet Row Counselor from 03/23/2021 in [place] from 11/23/2020 in [place] from 07/26/2020 in [place] from 06/07/2020 in [place] Visit from 04/05/2020 in [place] at [place]  PHQ-2 Total Score 0 0 5 0 1  PHQ-9 Total Score -- -- 22 7 7       Flowsheet Row Counselor from 03/23/2021 in [place] Counselor from 02/15/2021 in [place] ED from 01/19/2021 in [place] CATEGORY No  Risk No Risk No Risk        Assessment and Plan:   Brenlee is a 25 yr old  Caucasian female who is single, employed, has a history of bipolar disorder, generalized anxiety disorder, insomnia evaluated today for a follow-up visit for medication management.  She reports continued stability in mood and anxiety and therefore recommending to continue with current medications.  Has not been using MJA recently, and reports not using alcohol. Reviewed response to current medication and recommneding medications as below.   Plan as below.  Reviewed on 03/31/21  Plan  Bipolar 2 disorder (chronic, stable) Continue with lamotrigine  150 mg daily.  Continue Luvox  200 mg p.o. daily Continue Abilify at 10mg  po qd.   For generalized anxiety disorder-(chronic and stable) -  ind therapy at [facility],  Luvox as mentioned above.  Medications above will be helpful in decreasing anxiety. Previously tried Prozac upto 10 mg daily and Zoloft 150 mg daily - stopped due to inefficacy.    For insomnia-chronic and stable Continue trazodone 150mg  p.o. nightly Can take Atarax 25-50 mg QHS PRN for sleep.      This note was generated in part or whole with voice recognition software. Voice recognition is usually quite accurate but there are transcription errors that can and very often do occur. I apologize for any typographical errors that were not detected and corrected.    MDM = 2 or more chronic stable conditions + med management  Orlene Erm, MD 03/31/2021, 10:21 AM

## 2021-04-03 ENCOUNTER — Other Ambulatory Visit (HOSPITAL_COMMUNITY): Payer: Self-pay

## 2021-04-20 ENCOUNTER — Other Ambulatory Visit (HOSPITAL_COMMUNITY): Payer: Self-pay

## 2021-05-01 ENCOUNTER — Ambulatory Visit (INDEPENDENT_AMBULATORY_CARE_PROVIDER_SITE_OTHER): Payer: No Typology Code available for payment source | Admitting: Licensed Clinical Social Worker

## 2021-05-01 ENCOUNTER — Other Ambulatory Visit: Payer: Self-pay

## 2021-05-01 DIAGNOSIS — F411 Generalized anxiety disorder: Secondary | ICD-10-CM | POA: Diagnosis not present

## 2021-05-01 DIAGNOSIS — F3181 Bipolar II disorder: Secondary | ICD-10-CM

## 2021-05-01 NOTE — Progress Notes (Signed)
Virtual Visit via Video Note  I connected with Victoria Simmons on 05/01/21 at  4:00 PM EDT by a video enabled telemedicine application and verified that I am speaking with the correct person using two identifiers.  Location: Patient: home Provider: remote office [place], [place])   I discussed the limitations of evaluation and management by telemedicine and the availability of in person appointments. The patient expressed understanding and agreed to proceed.  I discussed the assessment and treatment plan with the patient. The patient was provided an opportunity to ask questions and all were answered. The patient agreed with the plan and demonstrated an understanding of the instructions.   The patient was advised to call back or seek an in-person evaluation if the symptoms worsen or if the condition fails to improve as anticipated.  I provided 30 minutes of non-face-to-face time during this encounter.   Prisca Gearing R Lujean Ebright, LCSW   THERAPIST PROGRESS NOTE  Session Time: 4-4:30p  Participation Level: Active  Behavioral Response: Neat and Well GroomedAlertAnxious and Depressed  Type of Therapy: Individual Therapy  Treatment Goals addressed: Anxiety, Coping, and Diagnosis: depression  Interventions: CBT and DBT  Summary: Victoria Simmons is a 25 y.o. female who presents with Improving symptoms related to bipolar disorder diagnosis. Patient reports that overall mood has been stable, and that she's managing stress and anxiety well. Patient is happy to report that she recently found a job, and is happy that she is no longer unemployed. 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 Allowed patient safe space to explore and express thoughts and feelings associated with recent external stressors and life events. Patient did not have any stressful life events that she wanted to discuss, but reports that they have adopted several new  dogs, so she currently has six dogs living in the home with her. Patient reports that she and wife are trying to save money to move into their own apartment. Allowed patient to explore and share thoughts and feelings associated with goals that she and wife are making for themselves. Encouraged patient to take things slowly, and to work through finances in a logical way. Patient feels well supported by family and knows that she's welcome to stay as long as she needs. Patient reports she is engaging well with friends, and has a friend group where she routinely spends time with everyone in the friend group on a regular basis. Patient reports that she knows that this helps her, so she tries to maintain these relationships. Discussed physical activity, and patient is trying to set goals for the future and attend the gym more frequently with her father. Patient reports that she does have a swimming pool at her parents home, and tries to swim on a regular basis for exercise. Patient denies any significant depression symptoms, or any current suicidal thoughts. Patient feels like she is in a good place emotionally. Discussed ways of maintaining progress.Continued recommendations are as follows: self care behaviors, positive social engagements, focusing on overall work/home/life balance, and focusing on positive physical and emotional wellness. .   Suicidal/Homicidal: No  Therapist Response: Victoria Simmons is continuing to identify and articulate specific current and past experiences with fears, worries, and anxiety symptoms including their impact on functioning and ways that Victoria Simmons is managing symptoms to limit impact on functioning. Victoria Simmons is increasing daily social and vocational activities and developing healthy interpersonal relationships. Victoria Simmons is being intentional about replacing fearful self talk with  positive and empowering self talk.  These behaviors are reflective of continuing progress. Focus on maintaining levels of  stability. Treatment to continue.    Plan: Return again in 4 weeks.  Diagnosis: Axis I: Bipolar, Depressed    Axis II: No diagnosis    Rachel Bo Bobi Daudelin, LCSW 05/01/2021

## 2021-05-08 ENCOUNTER — Other Ambulatory Visit (HOSPITAL_COMMUNITY): Payer: Self-pay

## 2021-05-24 ENCOUNTER — Other Ambulatory Visit (HOSPITAL_COMMUNITY): Payer: Self-pay

## 2021-06-01 ENCOUNTER — Ambulatory Visit: Payer: No Typology Code available for payment source | Admitting: Child and Adolescent Psychiatry

## 2021-06-02 ENCOUNTER — Other Ambulatory Visit: Payer: Self-pay

## 2021-06-02 ENCOUNTER — Ambulatory Visit (INDEPENDENT_AMBULATORY_CARE_PROVIDER_SITE_OTHER): Payer: No Typology Code available for payment source | Admitting: Licensed Clinical Social Worker

## 2021-06-02 DIAGNOSIS — F3181 Bipolar II disorder: Secondary | ICD-10-CM

## 2021-06-02 DIAGNOSIS — F411 Generalized anxiety disorder: Secondary | ICD-10-CM | POA: Diagnosis not present

## 2021-06-02 NOTE — Progress Notes (Signed)
Virtual Visit via Video Note  I connected with Victoria Simmons on 06/02/21 at 11:00 AM EDT by a video enabled telemedicine application and verified that I am speaking with the correct person using two identifiers.  Location: Patient: home Provider: remote office [place], [place])   I discussed the limitations of evaluation and management by telemedicine and the availability of in person appointments. The patient expressed understanding and agreed to proceed.   I discussed the assessment and treatment plan with the patient. The patient was provided an opportunity to ask questions and all were answered. The patient agreed with the plan and demonstrated an understanding of the instructions.   The patient was advised to call back or seek an in-person evaluation if the symptoms worsen or if the condition fails to improve as anticipated.  I provided 30 minutes of non-face-to-face time during this encounter.   Mya Suell R Jaimes Eckert, LCSW   THERAPIST PROGRESS NOTE  Session Time: 11-11:30a  Participation Level: Active  Behavioral Response: Neat and Well GroomedAlertAnxious  Type of Therapy: Individual Therapy  Treatment Goals addressed: Anxiety  Interventions: CBT and DBT  Summary: Victoria Simmons is a 25 y.o. female who presents with improving symptoms related to bipolar disorder diagnosis. Pt reports that she is still having some mood swings (triggered situationally) and panic attacks. Pt reports that her sleep quality and quantity is good. Reviewed panic attack management techniques. Pt recently got a new job at [employer] and is very happy about it. Discussed pts history of eating issues: binging, purging, not eating. Discussed triggers for these behaviors. Discussed healthy nutritional behaviors. Continued recommendations are as follows: self care behaviors, positive social engagements, focusing on overall work/home/life balance, and focusing on positive physical and emotional wellness.    Suicidal/Homicidal: No  Therapist Response: Victoria Simmons is continuing to identify and articulate specific current and past experiences with fears, worries, and anxiety symptoms including their impact on functioning and ways that Victoria Simmons is managing symptoms to limit impact on functioning. Victoria Simmons is increasing daily social and vocational activities and developing healthy interpersonal relationships. Helyne is being intentional about replacing fearful self talk with positive and empowering self talk.  These behaviors are reflective of continuing progress. Focus on maintaining levels of stability. Treatment to continue.    Plan: Return again in 4 weeks.  Diagnosis: Axis I: Bipolar, Depressed    Axis II: No diagnosis    [place], LCSW 06/02/2021

## 2021-06-12 ENCOUNTER — Other Ambulatory Visit: Payer: Self-pay | Admitting: Child and Adolescent Psychiatry

## 2021-06-12 DIAGNOSIS — F3181 Bipolar II disorder: Secondary | ICD-10-CM

## 2021-06-13 ENCOUNTER — Other Ambulatory Visit (HOSPITAL_COMMUNITY): Payer: Self-pay

## 2021-06-13 MED ORDER — LAMOTRIGINE 100 MG PO TABS
150.0000 mg | ORAL_TABLET | Freq: Every day | ORAL | 1 refills | Status: DC
  Filled 2021-06-13: qty 45, 30d supply, fill #0

## 2021-06-15 ENCOUNTER — Other Ambulatory Visit: Payer: Self-pay

## 2021-06-15 ENCOUNTER — Ambulatory Visit (INDEPENDENT_AMBULATORY_CARE_PROVIDER_SITE_OTHER): Payer: No Typology Code available for payment source | Admitting: Child and Adolescent Psychiatry

## 2021-06-15 ENCOUNTER — Other Ambulatory Visit (HOSPITAL_COMMUNITY): Payer: Self-pay

## 2021-06-15 ENCOUNTER — Encounter: Payer: Self-pay | Admitting: Child and Adolescent Psychiatry

## 2021-06-15 VITALS — BP 133/79 | HR 84 | Temp 98.1°F | Wt 186.6 lb

## 2021-06-15 DIAGNOSIS — Z79899 Other long term (current) drug therapy: Secondary | ICD-10-CM | POA: Diagnosis not present

## 2021-06-15 DIAGNOSIS — F411 Generalized anxiety disorder: Secondary | ICD-10-CM | POA: Diagnosis not present

## 2021-06-15 DIAGNOSIS — E538 Deficiency of other specified B group vitamins: Secondary | ICD-10-CM

## 2021-06-15 DIAGNOSIS — F3181 Bipolar II disorder: Secondary | ICD-10-CM

## 2021-06-15 DIAGNOSIS — E559 Vitamin D deficiency, unspecified: Secondary | ICD-10-CM | POA: Diagnosis not present

## 2021-06-15 MED ORDER — LAMOTRIGINE 100 MG PO TABS
150.0000 mg | ORAL_TABLET | Freq: Every day | ORAL | 1 refills | Status: DC
  Filled 2021-06-15 – 2021-07-05 (×2): qty 45, 30d supply, fill #0
  Filled 2021-08-10: qty 45, 30d supply, fill #1

## 2021-06-15 MED ORDER — FLUVOXAMINE MALEATE 100 MG PO TABS
200.0000 mg | ORAL_TABLET | Freq: Every evening | ORAL | 1 refills | Status: DC
  Filled 2021-06-15: qty 60, 30d supply, fill #0
  Filled 2021-07-17: qty 60, 30d supply, fill #1

## 2021-06-15 MED ORDER — TRAZODONE HCL 100 MG PO TABS
150.0000 mg | ORAL_TABLET | Freq: Every day | ORAL | 1 refills | Status: DC
  Filled 2021-06-15 – 2021-07-04 (×2): qty 45, 30d supply, fill #0
  Filled 2021-08-10: qty 45, 30d supply, fill #1

## 2021-06-15 MED ORDER — ARIPIPRAZOLE 10 MG PO TABS
10.0000 mg | ORAL_TABLET | Freq: Every day | ORAL | 1 refills | Status: DC
  Filled 2021-06-15: qty 30, 30d supply, fill #0
  Filled 2021-07-04: qty 30, 30d supply, fill #1

## 2021-06-15 NOTE — Progress Notes (Signed)
BH MD/PA/NP OP Progress Note  06/15/2021 4:59 PM Victoria Simmons  MRN:  [id number]  Chief Complaint: Medication management follow-up for bipolar disorder, generalized anxiety disorder and insomnia.  Synopsis: Victoria Simmons is a 25 year old Caucasian female, employed at daycare, with history of bipolar disorder type II, GAD, insomnia was seen by Dr. Einar Grad since 2017 and transition to this writer in April 2020.  She is currently prescribed Lamictal 150 mg once a day, Abilify 10 mg once a day, trazodone 150 mg once a day and Luvox 150 mg once a day.   HPI:   Victoria Simmons was present by herself and was evaluated in the office for medication management follow-up.    She was last seen about 2-1/2 months ago and missed her last appointment in the interim since then.  She reports that she has been doing well, has been more stressed recently in the context of difficulties with her father and starting job again at [employer]  She reports that she has been able to manage her stress well and her anxiety is manageabl.  She rates her anxiety at 5 out of 10(10 = most anxious).  She reports that she has occasional sadness but she is not depressed.  She rates her mood around 6 or 7 out of 10(10 = best mood).  She reports that she has been sleeping well overall however has been waking up frequently.  She reports that she has been taking trazodone and we discussed that she can add hydroxyzine as needed to help her with her sleep.  She verbalized understanding.  She reports that she has been eating well, being mindful about her eating and lost about 10 pounds since April of this year.  She reports that she has been hanging out with her wife in free time and reports that her wife is very supportive to her.  She denies any suicidal thoughts or homicidal thoughts.  She reports that she has been compliant with her medications and denies any side effects from them.  We discussed to continue with current medications and follow back again  and 2 months or earlier if needed.  We discussed to do blood work to monitor her metabolic labs and request for blood work was given to her and to be done on fasting.  She verbalized understanding.  She continues to see her therapist about every few weeks.   Visit Diagnosis:    ICD-10-CM   1. Other long term (current) drug therapy  Z79.899 CBC With Differential    Comprehensive metabolic panel    Hemoglobin A1c    Lipid panel    TSH    2. Vitamin D deficiency  E55.9 VITAMIN D 25 Hydroxy (Vit-D Deficiency, Fractures)    3. Vitamin B12 deficiency  E53.8 Vitamin B12    4. GAD (generalized anxiety disorder)  F41.1 fluvoxaMINE (LUVOX) 100 MG tablet    5. Bipolar 2 disorder, major depressive episode (HCC)  F31.81 lamoTRIgine (LAMICTAL) 100 MG tablet    ARIPiprazole (ABILIFY) 10 MG tablet      Past Psychiatric History: PPHx reviewed today and as followin. 2 previous inpatient admission at [facility] at the age of 38, and last at [facility], no previous suicide attempts, has history of cutting and her past medication trials include Prozac and Zoloft.  Decreasing Luvox to 50 mg worsened her symptoms of anxiety therefore it was increased back to 100 mg once a day.   Previous trials of Zoloft 150 mg and Prozac 10 mg  stopped due to inefficacy.   Past Medical History:  Past Medical History:  Diagnosis Date   Anxiety    Bipolar 2 disorder ([place])    Depression    Dysmenorrhea    Hypertriglyceridemia 2020   MDD (major depressive disorder) 06/19/2017   Ovarian torsion 2008    Past Surgical History:  Procedure Laterality Date   OVARY SURGERY  06/30/2007   dx laparoscopy, reduction of right adnexal torsion, bilateral oophoropexy of each ovary to posterior uterine fundus    Family Psychiatric History: As mentioned in initial H&P, reviewed today, no change Family History:  Family History  Problem Relation Age of Onset   Hepatitis B Mother    Anxiety disorder Mother    Hypertension Father    ADD / ADHD  Brother    Ovarian cancer Paternal Grandmother     Social History:  Social History   Socioeconomic History   Marital status: Single    Spouse name: Not on file   Number of children: 0   Years of education: Not on file   Highest education level: Some college, no degree  Occupational History   Occupation: [employer]    Comment: full time  Tobacco Use   Smoking status: Never   Smokeless tobacco: Never  Vaping Use   Vaping Use: Former  Substance and Sexual Activity   Alcohol use: Yes    Comment: last drink on Tuesday,: pt had a glass of wine, drinks twice a month (5-7 drinks)   Drug use: Yes    Types: Marijuana    Comment: last use 16 Aug 2020   Sexual activity: Yes  Other Topics Concern   Not on file  Social History Narrative          Social Determinants of Health   Financial Resource Strain: Not on file  Food Insecurity: Not on file  Transportation Needs: Not on file  Physical Activity: Not on file  Stress: Not on file  Social Connections: Not on file    Allergies: No Known Allergies  Metabolic Disorder Labs: Lab Results  Component Value Date   HGBA1C 5.0 07/26/2020   MPG 97 07/26/2020   MPG 94 05/14/2017   No results found for: PROLACTIN Lab Results  Component Value Date   CHOL 226 (H) 07/26/2020   TRIG 138 07/26/2020   HDL 44 07/26/2020   CHOLHDL 5.1 07/26/2020   VLDL 28 07/26/2020   LDLCALC 154 (H) 07/26/2020   LDLCALC 103 (H) 04/05/2020   Lab Results  Component Value Date   TSH 2.401 07/26/2020   TSH 2.64 04/05/2020    Therapeutic Level Labs: No results found for: LITHIUM No results found for: VALPROATE No components found for:  CBMZ  Current Medications: Current Outpatient Medications  Medication Sig Dispense Refill   hydrOXYzine (ATARAX/VISTARIL) 25 MG tablet TAKE 1 TABLET (25 MG TOTAL) BY MOUTH 3 (THREE) TIMES DAILY AS NEEDED FOR ANXIETY. 75 tablet 1   Levonorgestrel (KYLEENA IU) by Intrauterine route.     meloxicam (MOBIC)  15 MG tablet Take 1 tablet (15 mg total) by mouth daily. 30 tablet 2   pantoprazole (PROTONIX) 40 MG tablet TAKE 1 TABLET (40 MG TOTAL) BY MOUTH 2 (TWO) TIMES DAILY. 60 tablet 3   Sodium Sulfate-Mag Sulfate-KCl [phone] MG TABS TAKE AS DIRECTED 24 tablet 0   sucralfate (CARAFATE) 1 g tablet TAKE 1 TABLET (1 G TOTAL) BY MOUTH EVERY 6 (SIX) HOURS. 30 tablet 3   ARIPiprazole (ABILIFY) 10 MG tablet Take  1 tablet (10 mg total) by mouth at bedtime. 30 tablet 1   fluvoxaMINE (LUVOX) 100 MG tablet Take 2 tablets (200 mg total) by mouth at bedtime. 60 tablet 1   lamoTRIgine (LAMICTAL) 100 MG tablet Take 1.5 tablets (150 mg total) by mouth at bedtime. 45 tablet 1   traZODone (DESYREL) 100 MG tablet Take 1.5 tablets (150 mg total) by mouth at bedtime. 45 tablet 1   No current facility-administered medications for this visit.     Musculoskeletal: Strength & Muscle Tone: WNL Gait & Station: Normal Patient leans: N/A  Psychiatric Specialty Exam: ROSReview of 12 systems negative except as mentioned in HPI   Blood pressure 133/79, pulse 84, temperature 98.1 F (36.7 C), temperature source Temporal, weight 186 lb 9.6 oz (84.6 kg).Body mass index is 31.05 kg/m.  General Appearance: Casual  Eye Contact:  Good  Speech:  Clear and Coherent and Normal Rate  Volume:  Normal  Mood: "good..."  Affect:  Appropriate, Congruent, and Full Range  Thought Process:  Goal Directed and Linear  Orientation:  Full (Time, Place, and Person)  Thought Content: Logical   Suicidal Thoughts:  No  Homicidal Thoughts:  No  Memory:  Immediate;   Fair Recent;   Fair Remote;   Fair  Judgement:  Good  Insight:  Good  Psychomotor Activity:  Normal  Concentration:  Concentration: Good and Attention Span: Good  Recall:  Good  Fund of Knowledge: Good  Language: Good  Akathisia:  No    AIMS (if indicated): not done  Assets:  Communication Skills Desire for Improvement Financial Resources/Insurance Housing Leisure  Time Physical Health Social Support Transportation Vocational/Educational  ADL's:  Intact  Cognition: WNL  Sleep:  Good     Screenings: AIMS    Flowsheet Row Admission (Discharged) from 05/13/2017 in [place] Total Score 0      AUDIT    Flowsheet Row Admission (Discharged) from 07/27/2020 in [place] [facility] Admission (Discharged) from 05/13/2017 in [place]  Alcohol Use Disorder Identification Test Final Score (AUDIT) 6 0      GAD-7    Flowsheet Row Office Visit from 04/05/2020 in [place] at [facility]  Total GAD-7 Score 7      PHQ2-9    [place] Office Visit from 06/15/2021 in [place] from 05/01/2021 in [place] from 03/23/2021 in [place] from 11/23/2020 in [place] from 07/26/2020 in [place]  PHQ-2 Total Score 1 0 0 0 5  PHQ-9 Total Score -- -- -- -- [place] Office Visit from 06/15/2021 in [place] from 06/02/2021 in [place] from 05/01/2021 in [place] No Risk No Risk No Risk        Assessment and Plan:   Christol is a 25 yr old Caucasian female who is single, employed, has a history of bipolar disorder, generalized anxiety disorder, insomnia evaluated today for a follow-up visit for medication management.  She reports continued stability in mood and anxiety despite new psychosocial stressors(difficulties with father and starting job back at daycare which was previously very stressful) and therefore recommending to continue with current medications.  He has not been using MJA recently, and reports not using alcohol. Reviewed  response to current medication and recommneding medications as  below.   Plan as below.  Reviewed on 06/15/21  Plan  Bipolar 2 disorder (chronic, stable) Continue with lamotrigine 150 mg daily.  Continue Luvox  200 mg p.o. daily Continue Abilify at '10mg'$  po qd.   For generalized anxiety disorder-(chronic and stable) -  ind therapy at [facility],  Luvox as mentioned above.  Medications above will be helpful in decreasing anxiety. Previously tried Prozac upto 10 mg daily and Zoloft 150 mg daily - stopped due to inefficacy.    For insomnia-chronic and stable Continue trazodone '150mg'$  p.o. nightly Can take Atarax 25-50 mg QHS PRN for sleep.   Labs including CBC, CMP, TSH, lipid panel, hemoglobin A1c, vitamin D and B12 levels were ordered.     This note was generated in part or whole with voice recognition software. Voice recognition is usually quite accurate but there are transcription errors that can and very often do occur. I apologize for any typographical errors that were not detected and corrected.    MDM = 2 or more chronic stable conditions + med management  Orlene Erm, MD 06/15/2021, 4:59 PM

## 2021-06-16 ENCOUNTER — Other Ambulatory Visit (HOSPITAL_COMMUNITY): Payer: Self-pay

## 2021-07-04 ENCOUNTER — Other Ambulatory Visit (HOSPITAL_COMMUNITY): Payer: Self-pay

## 2021-07-05 ENCOUNTER — Other Ambulatory Visit (HOSPITAL_COMMUNITY): Payer: Self-pay

## 2021-07-06 ENCOUNTER — Other Ambulatory Visit (HOSPITAL_COMMUNITY): Payer: Self-pay

## 2021-07-07 ENCOUNTER — Other Ambulatory Visit (HOSPITAL_COMMUNITY): Payer: Self-pay

## 2021-07-18 ENCOUNTER — Other Ambulatory Visit (HOSPITAL_COMMUNITY): Payer: Self-pay

## 2021-07-24 ENCOUNTER — Ambulatory Visit (INDEPENDENT_AMBULATORY_CARE_PROVIDER_SITE_OTHER): Payer: No Typology Code available for payment source | Admitting: Licensed Clinical Social Worker

## 2021-07-24 ENCOUNTER — Other Ambulatory Visit: Payer: Self-pay

## 2021-07-24 DIAGNOSIS — F3181 Bipolar II disorder: Secondary | ICD-10-CM | POA: Diagnosis not present

## 2021-07-24 NOTE — Progress Notes (Signed)
Virtual Visit via Video Note  I connected with Victoria Simmons on 07/24/21 at  4:00 PM EDT by a video enabled telemedicine application and verified that I am speaking with the correct person using two identifiers.  Location: Patient: home Provider: remote office [place], [place])   I discussed the limitations of evaluation and management by telemedicine and the availability of in person appointments. The patient expressed understanding and agreed to proceed.   I discussed the assessment and treatment plan with the patient. The patient was provided an opportunity to ask questions and all were answered. The patient agreed with the plan and demonstrated an understanding of the instructions.   The patient was advised to call back or seek an in-person evaluation if the symptoms worsen or if the condition fails to improve as anticipated.  I provided 25 minutes of non-face-to-face time during this encounter.   Victoria Simmons R Victoria Castellana, LCSW  THERAPIST PROGRESS NOTE  Session Time: 4-4:25p  Participation Level: Active  Behavioral Response: Neat and Well GroomedAlertAnxious and Depressed  Type of Therapy: Individual Therapy  Treatment Goals addressed:  Goal: LTG: Stabilize mood and increase goal-directed behavior: per pt self report Outcome: Progressing Note: Mood more stable this session per pt report  Goal: STG: Victoria Simmons WILL ATTEND AT LEAST 80% OF SCHEDULED FOLLOW-UP OPT APPOINTMENTS Outcome: Progressing Note: Pt attended session on time  Interventions:  Intervention: Assess emotional status and coping mechanisms  Intervention: Assist with coping skills and behavior  Intervention: Support useful positive self-talk  Intervention: WORK WITH Victoria Simmons TO IDENTIFY THEIR 3 PERSONAL GOALS FOR MANAGING DEPRESSION SYMPTOMS AND ADD TO THIS PLAN  Summary: Victoria Simmons is a 25 y.o. female who presents with improving symptoms related to bipolar disorder diagnosis. Pt reports that her overall mood  is stable and that she has been managing situational stressors well. Pt reports that she recently returned from a cruise with her family and spouse--had a great time. Pt is now working back at the daycare and reports that things are going well. Pt reports that she feels things are stable in life currently. Reviewed coping skills to help manage depression symptoms and anxiety symptoms.  Continued recommendations are as follows: self care behaviors, positive social engagements, focusing on overall work/home/life balance, and focusing on positive physical and emotional wellness.   Suicidal/Homicidal: No  Therapist Response: Pt is continuing to apply interventions learned in session into daily life situations. Pt is currently on track to meet goals utilizing interventions mentioned above. Personal growth and progress noted. Treatment to continue as indicated.   Plan: Return again in 4 weeks.  Diagnosis: Axis I: Bipolar 2, depressive episode.    Axis II: No diagnosis    Victoria Bo Spurgeon Gancarz, LCSW 07/24/2021

## 2021-07-24 NOTE — Plan of Care (Signed)
  Problem: Alleviate depressive/manic symptoms and return to improved levels of effective functioning. Goal: LTG: Stabilize mood and increase goal-directed behavior: per pt self report Outcome: Progressing Note: Mood more stable this session per pt report Goal: STG: Victoria Simmons WILL ATTEND AT LEAST 80% OF SCHEDULED FOLLOW-UP OPT APPOINTMENTS Outcome: Progressing Note: Pt attended session on time Intervention: Assess emotional status and coping mechanisms Intervention: Assist with coping skills and behavior Intervention: Support useful positive self-talk Intervention: WORK WITH Victoria Simmons TO IDENTIFY THEIR 3 PERSONAL GOALS FOR MANAGING DEPRESSION SYMPTOMS AND ADD TO THIS PLAN

## 2021-08-07 ENCOUNTER — Other Ambulatory Visit (HOSPITAL_COMMUNITY): Payer: Self-pay

## 2021-08-07 ENCOUNTER — Other Ambulatory Visit: Payer: Self-pay | Admitting: Child and Adolescent Psychiatry

## 2021-08-07 DIAGNOSIS — F3181 Bipolar II disorder: Secondary | ICD-10-CM

## 2021-08-07 MED ORDER — ARIPIPRAZOLE 10 MG PO TABS
10.0000 mg | ORAL_TABLET | Freq: Every day | ORAL | 1 refills | Status: DC
Start: 2021-08-07 — End: 2021-11-16
  Filled 2021-08-07: qty 30, 30d supply, fill #0
  Filled 2021-10-12: qty 30, 30d supply, fill #1

## 2021-08-11 ENCOUNTER — Other Ambulatory Visit (HOSPITAL_COMMUNITY): Payer: Self-pay

## 2021-08-22 ENCOUNTER — Other Ambulatory Visit: Payer: Self-pay | Admitting: Child and Adolescent Psychiatry

## 2021-08-22 ENCOUNTER — Other Ambulatory Visit (HOSPITAL_COMMUNITY): Payer: Self-pay

## 2021-08-22 DIAGNOSIS — F411 Generalized anxiety disorder: Secondary | ICD-10-CM

## 2021-08-22 MED ORDER — FLUVOXAMINE MALEATE 100 MG PO TABS
200.0000 mg | ORAL_TABLET | Freq: Every evening | ORAL | 1 refills | Status: DC
Start: 2021-08-22 — End: 2021-10-31
  Filled 2021-08-22: qty 60, 30d supply, fill #0
  Filled 2021-09-21: qty 60, 30d supply, fill #1

## 2021-08-28 ENCOUNTER — Ambulatory Visit (INDEPENDENT_AMBULATORY_CARE_PROVIDER_SITE_OTHER): Payer: No Typology Code available for payment source | Admitting: Licensed Clinical Social Worker

## 2021-08-28 ENCOUNTER — Other Ambulatory Visit: Payer: Self-pay

## 2021-08-28 DIAGNOSIS — F3181 Bipolar II disorder: Secondary | ICD-10-CM | POA: Diagnosis not present

## 2021-08-28 NOTE — Progress Notes (Signed)
Virtual Visit via Video Note  I connected with Victoria Simmons on 08/28/21 at  2:00 PM EST by a video enabled telemedicine application and verified that I am speaking with the correct person using two identifiers.  Location: Patient: home Provider: remote office [place], [place])   I discussed the limitations of evaluation and management by telemedicine and the availability of in person appointments. The patient expressed understanding and agreed to proceed.   I discussed the assessment and treatment plan with the patient. The patient was provided an opportunity to ask questions and all were answered. The patient agreed with the plan and demonstrated an understanding of the instructions.   The patient was advised to call back or seek an in-person evaluation if the symptoms worsen or if the condition fails to improve as anticipated.  I provided 30 minutes of non-face-to-face time during this encounter.   Dorthie Santini R Hollyanne Schloesser, LCSW  THERAPIST PROGRESS NOTE  Session Time: 2-230p  Participation Level: Active  Behavioral Response: Neat and Well GroomedAlertAnxious  Type of Therapy: Individual Therapy  Treatment Goals addressed: Problem: Alleviate depressive/manic symptoms and return to improved levels of effective functioning. Goal: LTG: Stabilize mood and increase goal-directed behavior: per pt self report Outcome: Progressing Goal: STG: Russell WILL ATTEND AT LEAST 80% OF SCHEDULED FOLLOW-UP OPT APPOINTMENTS Outcome: Progressing  Interventions: Intervention: Assist with relaxation techniques, as appropriate (deep breathing exercises, meditation, guided imagery) Intervention: Encourage patient to identify triggers Intervention: Assess emotional status and coping mechanisms    Summary: Victoria Simmons is a 25 y.o. female who presents with symptoms consistent with bipolar II diagnosis. Pt reports that she has felt somewhat unstable recently because of fight with wife. Pt states that  there were some financial constraints and pts wife took 49 from her parents and told Kamla it was a gift. Pts wife also has a female friend that she has been posing as his "girlfriend" and this has pt feeling unsure about their relationship and feels like she cannot trust her wife.Pt reports that this was their first fight and she was triggered by it.  Pt reports that she is more aware of her emotional volatility and fear of abandonment after this recent conflict. Pt denies SI. Pt reports that she is continuing to experience insomnia at times.  Reviewed emotion regulation strategies and encouraged pt to focus on self care.    Continued recommendations are as follows: self care behaviors, positive social engagements, focusing on overall work/home/life balance, and focusing on positive physical and emotional wellness.   Suicidal/Homicidal: No  Therapist Response: Pt is continuing to apply interventions learned in session into daily life situations. Pt is currently on track to meet goals utilizing interventions mentioned above. Personal growth and progress noted. Treatment to continue as indicated.   Plan: Return again in 4 weeks.  Diagnosis: Axis I: Bipolar 2, depressed    Axis II: No diagnosis    [place], LCSW 08/28/2021

## 2021-08-28 NOTE — Plan of Care (Signed)
  Problem: Alleviate depressive/manic symptoms and return to improved levels of effective functioning. Goal: LTG: Stabilize mood and increase goal-directed behavior: per pt self report Outcome: Progressing Goal: STG: Victoria Simmons WILL ATTEND AT LEAST 80% OF SCHEDULED FOLLOW-UP OPT APPOINTMENTS Outcome: Progressing Intervention: Assist with relaxation techniques, as appropriate (deep breathing exercises, meditation, guided imagery) Intervention: Encourage patient to identify triggers Intervention: Assess emotional status and coping mechanisms

## 2021-09-05 ENCOUNTER — Other Ambulatory Visit (HOSPITAL_COMMUNITY): Payer: Self-pay

## 2021-09-05 ENCOUNTER — Telehealth: Payer: Self-pay

## 2021-09-05 ENCOUNTER — Other Ambulatory Visit: Payer: Self-pay

## 2021-09-05 ENCOUNTER — Emergency Department
Admission: EM | Admit: 2021-09-05 | Discharge: 2021-09-05 | Disposition: A | Discharge status: Home / Self Care | Payer: No Typology Code available for payment source | Attending: Emergency Medicine | Admitting: Emergency Medicine

## 2021-09-05 DIAGNOSIS — K921 Melena: Secondary | ICD-10-CM | POA: Insufficient documentation

## 2021-09-05 DIAGNOSIS — K648 Other hemorrhoids: Secondary | ICD-10-CM | POA: Diagnosis not present

## 2021-09-05 LAB — COMPREHENSIVE METABOLIC PANEL
ALT: 14 U/L (ref 0–44)
AST: 14 U/L — ABNORMAL LOW (ref 15–41)
Albumin: 4.3 g/dL (ref 3.5–5.0)
Alkaline Phosphatase: 62 U/L (ref 38–126)
Anion gap: 6 (ref 5–15)
BUN: 14 mg/dL (ref 6–20)
CO2: 24 mmol/L (ref 22–32)
Calcium: 9.1 mg/dL (ref 8.9–10.3)
Chloride: 105 mmol/L (ref 98–111)
Creatinine, Ser: 0.95 mg/dL (ref 0.44–1.00)
GFR, Estimated: >60 mL/min (ref >60)
Glucose, Bld: 94 mg/dL (ref 70–99)
Potassium: 3.8 mmol/L (ref 3.5–5.1)
Sodium: 135 mmol/L (ref 135–145)
Total Bilirubin: 0.8 mg/dL (ref 0.3–1.2)
Total Protein: 7.8 g/dL (ref 6.5–8.1)

## 2021-09-05 LAB — CBC
HCT: 41.6 % (ref 36.0–46.0)
Hemoglobin: 13.5 g/dL (ref 12.0–15.0)
MCH: 27.5 pg (ref 26.0–34.0)
MCHC: 32.5 g/dL (ref 30.0–36.0)
MCV: 84.7 fL (ref 80.0–100.0)
Platelets: 194 10*3/uL (ref 150–400)
RBC: 4.91 MIL/uL (ref 3.87–5.11)
RDW: 13.5 % (ref 11.5–15.5)
WBC: 8.1 10*3/uL (ref 4.0–10.5)
nRBC: 0 % (ref 0.0–0.2)

## 2021-09-05 LAB — URINALYSIS, ROUTINE W REFLEX MICROSCOPIC
Bilirubin Urine: NEGATIVE
Glucose, UA: NEGATIVE mg/dL
Ketones, ur: NEGATIVE mg/dL
Leukocytes,Ua: NEGATIVE
Nitrite: NEGATIVE
Protein, ur: NEGATIVE mg/dL
Specific Gravity, Urine: >1.03 — ABNORMAL HIGH (ref 1.005–1.030)
pH: 5.5 (ref 5.0–8.0)

## 2021-09-05 LAB — POC URINE PREG, ED: Preg Test, Ur: NEGATIVE

## 2021-09-05 LAB — URINALYSIS, MICROSCOPIC (REFLEX)

## 2021-09-05 LAB — LIPASE, BLOOD: Lipase: 24 U/L (ref 11–51)

## 2021-09-05 MED ORDER — SUCRALFATE 1 G PO TABS
1.0000 g | ORAL_TABLET | Freq: Three times a day (TID) | ORAL | 0 refills | Status: DC
  Filled 2021-09-05: qty 120, 30d supply, fill #0

## 2021-09-05 NOTE — Telephone Encounter (Signed)
This copy may be easier to [place] RECORD AccessNurse Patient Name: MIRAGE PFEFFERKORN LE Gender: Female DOB: 1995/10/19 Age: 25 Y 13 D Return Phone Number: [phone] (Primary) Address: City/ State/ Zip: [place] - Client Client Site [place] Provider Ria Bush - MD Contact Type Call Who Is Calling Patient / Member / Family / Caregiver Call Type Triage / Clinical Relationship To Patient Self Return Phone Number [phone] (Primary) Chief Complaint SEVERE ABDOMINAL PAIN - Severe pain in abdomen Reason for Call Symptomatic / Request for [place] states pt has severe abdominal pain and blood in her stool. Translation No Nurse Assessment Nurse: Thad Ranger, RN, Denise Date/Time (Eastern Time): 09/05/2021 11:27:02 AM Confirm and document reason for call. If symptomatic, describe symptoms. ---Caller states pt has severe abdominal pain, vomiting blood and blood in her stool. Does the patient have any new or worsening symptoms? ---Yes Will a triage be completed? ---Yes Related visit to physician within the last 2 weeks? ---No Does the PT have any chronic conditions? (i.e. diabetes, asthma, this includes High risk factors for pregnancy, etc.) ---No Is the patient pregnant or possibly pregnant? (Ask all females between the ages of 43-55) ---No Is this a behavioral health or substance abuse call? ---No Guidelines Guideline Title Affirmed Question Affirmed Notes Nurse Date/Time (Eastern Time) Vomiting Blood [1] Vomited blood AND [2] large amount (example: "a cup of blood") Carmon, RN, Denise 09/05/2021 11:28:22 AM Disp. Time Eilene Ghazi Time) Disposition Final User 09/05/2021 11:16:30 AM Attempt made - message left Carmon, RN, Langley Gauss 09/05/2021 11:31:15 AM 911 Outcome Documentation Carmon, RN,  Langley Gauss PLEASE NOTE: All timestamps contained within this report are represented as [place] Standard Time. CONFIDENTIALTY NOTICE: This fax transmission is intended only for the addressee. It contains information that is legally privileged, confidential or otherwise protected from use or disclosure. If you are not the intended recipient, you are strictly prohibited from reviewing, disclosing, copying using or disseminating any of this information or taking any action in reliance on or regarding this information. If you have received this fax in error, please notify us immediately by telephone so that we can arrange for its return to [name]. Phone: [phone], Toll-Free: [phone], Fax: [phone] Page: 2 of 2 Call Id: [id number] [place]. Time (Eastern Time) Disposition Final User Reason: Pt refused to call 911 and having someone take her to ER now 09/05/2021 11:30:21 AM Call EMS 911 Now Yes Carmon, RN, Yevette Edwards Disagree/Comply Disagree Caller Understands Yes PreDisposition Call Doctor Care Advice Given Per Guideline CALL EMS 911 NOW: CARE ADVICE given per Vomiting Blood (Adult) guideline. Referrals [facility]; pt is already at [facility] ED.

## 2021-09-05 NOTE — Telephone Encounter (Signed)
Will await ER evaluation

## 2021-09-05 NOTE — Telephone Encounter (Signed)
Per chart review tab pt is already at [facility] ED. Sending note to Dr G and Lisa CMA. 

## 2021-09-05 NOTE — ED Provider Notes (Signed)
[facility] Emergency Department Provider Note ____________________________________________   Event Date/Time   First MD Initiated Contact with Patient 09/05/21 1424     (approximate)  I have reviewed the triage vital signs and the nursing notes.  HISTORY  Chief Complaint GI Bleeding   HPI Victoria Simmons is a 25 y.o. femalewho presents to the ED for evaluation of possible GI bleeding.  Chart review indicates various mental health diagnoses.  Colonoscopy/EGD as recently as 1 year ago.  Internal hemorrhoids are noted on the colonoscopy, and gastritis noted on the EGD.  Patient presents to the ED, accompanied by her wife, for evaluation of 1 week of hematochezia and hematemesis.  She reports 3-5 episodes of hematochezia per day and a similar for hematemesis.  Reports some intermittent abdominal cramping that is mild but denies any focal or significant pain.  Denies fever, syncope, hematuria or epistaxis.  Denies any abdominal pain right now.  Reports that she called her father on the phone who urged her to get checked out in the ED due to the blood.  She is compliant with her PPI.   Past Medical History:  Diagnosis Date   Anxiety    Bipolar 2 disorder ([place])    Depression    Dysmenorrhea    Hypertriglyceridemia 2020   MDD (major depressive disorder) 06/19/2017   Ovarian torsion 2008    Patient Active Problem List   Diagnosis Date Noted   Neck strain, subsequent encounter 01/26/2021   Bloody diarrhea 04/06/2020   Epigastric abdominal pain 04/06/2020   Well woman exam 06/19/2017   HLD (hyperlipidemia) 06/19/2017   Bipolar 2 disorder, major depressive episode ([place]) 05/13/2017   GAD (generalized anxiety disorder) 05/13/2017   Menorrhagia 01/25/2014   Family history of hypercholesterolemia 01/06/2013   Murmur 01/06/2013    Past Surgical History:  Procedure Laterality Date   OVARY SURGERY  06/30/2007   dx laparoscopy, reduction of right adnexal  torsion, bilateral oophoropexy of each ovary to posterior uterine fundus    Prior to Admission medications   Medication Sig Start Date End Date Taking? Authorizing Provider  sucralfate (CARAFATE) 1 g tablet Take 1 tablet (1 g total) by mouth 4 (four) times daily -  with meals and at bedtime. 09/05/21 10/05/21 Yes Vladimir Crofts, MD  ARIPiprazole (ABILIFY) 10 MG tablet Take 1 tablet (10 mg total) by mouth at bedtime. 08/07/21   Orlene Erm, MD  fluvoxaMINE (LUVOX) 100 MG tablet Take 2 tablets (200 mg total) by mouth at bedtime. 08/22/21   Orlene Erm, MD  hydrOXYzine (ATARAX/VISTARIL) 25 MG tablet TAKE 1 TABLET (25 MG TOTAL) BY MOUTH 3 (THREE) TIMES DAILY AS NEEDED FOR ANXIETY. 12/30/20 12/30/21  Orlene Erm, MD  lamoTRIgine (LAMICTAL) 100 MG tablet Take 1.5 tablets (150 mg total) by mouth at bedtime. 06/15/21   Orlene Erm, MD  Levonorgestrel (KYLEENA IU) by Intrauterine route. 02/21/18 02/22/23  [provider]  meloxicam (MOBIC) 15 MG tablet Take 1 tablet (15 mg total) by mouth daily. 01/19/21 01/19/22  Lannie Fields, PA-C  pantoprazole (PROTONIX) 40 MG tablet TAKE 1 TABLET (40 MG TOTAL) BY MOUTH 2 (TWO) TIMES DAILY. 08/25/20 08/25/21  Armbruster, Carlota Raspberry, MD  sucralfate (CARAFATE) 1 g tablet TAKE 1 TABLET (1 G TOTAL) BY MOUTH EVERY 6 (SIX) HOURS. 08/25/20 08/25/21  Yetta Flock, MD  traZODone (DESYREL) 100 MG tablet Take 1.5 tablets (150 mg total) by mouth at bedtime. 06/15/21   Orlene Erm, MD  dicyclomine (BENTYL) 10 MG capsule Take 1 capsule (10 mg total) by mouth 4 (four) times daily as needed for spasms. Patient not taking: Reported on 07/26/2020 07/08/20 07/26/20  Noralyn Pick, NP    Allergies Patient has no known allergies.  Family History  Problem Relation Age of Onset   Hepatitis B Mother    Anxiety disorder Mother    Hypertension Father    ADD / ADHD Brother    Ovarian cancer Paternal Grandmother     Social History Social  History   Tobacco Use   Smoking status: Never   Smokeless tobacco: Never  Vaping Use   Vaping Use: Former  Substance Use Topics   Alcohol use: Yes    Comment: last drink on Tuesday,: pt had a glass of wine, drinks twice a month (5-7 drinks)   Drug use: Yes    Types: Marijuana    Comment: last use 16 Aug 2020    Review of Systems  Constitutional: No fever/chills Eyes: No visual changes. ENT: No sore throat. Cardiovascular: Denies chest pain. Respiratory: Denies shortness of breath. Gastrointestinal: Positive for diffuse abdominal cramping, nausea, hematemesis and hematochezia. Genitourinary: Negative for dysuria. Musculoskeletal: Negative for back pain. Skin: Negative for rash. Neurological: Negative for headaches, focal weakness or numbness.  ____________________________________________   PHYSICAL EXAM:  VITAL SIGNS: Vitals:   09/05/21 1215 09/05/21 1441  BP: 106/80 97/68  Pulse: 80 67  Resp: 18 20  Temp: 98.2 F (36.8 C)   SpO2: 98% 93%     Constitutional: Alert and oriented. Well appearing and in no acute distress.  Obese.  Well appearing, pleasant and conversational. Eyes: Conjunctivae are normal. PERRL. EOMI. Head: Atraumatic. Nose: No congestion/rhinnorhea. Mouth/Throat: Mucous membranes are moist.  Oropharynx non-erythematous. Neck: No stridor. No cervical spine tenderness to palpation. Cardiovascular: Normal rate, regular rhythm. Grossly normal heart sounds.  Good peripheral circulation. Respiratory: Normal respiratory effort.  No retractions. Lungs CTAB. Gastrointestinal: Soft , nondistended, nontender to palpation. No CVA tenderness. Musculoskeletal: No lower extremity tenderness nor edema.  No joint effusions. No signs of acute trauma. Neurologic:  Normal speech and language. No gross focal neurologic deficits are appreciated. No gait instability noted. Skin:  Skin is warm, dry and intact. No rash noted. Psychiatric: Mood and affect are normal. Speech  and behavior are normal.  ____________________________________________   LABS (all labs ordered are listed, but only abnormal results are displayed)  Labs Reviewed  COMPREHENSIVE METABOLIC PANEL - Abnormal; Notable for the following components:      Result Value   AST 14 (*)    All other components within normal limits  URINALYSIS, ROUTINE W REFLEX MICROSCOPIC - Abnormal; Notable for the following components:   Specific Gravity, Urine >1.030 (*)    Hgb urine dipstick MODERATE (*)    All other components within normal limits  URINALYSIS, MICROSCOPIC (REFLEX) - Abnormal; Notable for the following components:   Bacteria, UA RARE (*)    All other components within normal limits  LIPASE, BLOOD  CBC  POC URINE PREG, ED   ____________________________________________  12 Lead EKG   ____________________________________________  RADIOLOGY  ED MD interpretation:    Official radiology report(s): No results found.  ____________________________________________   PROCEDURES and INTERVENTIONS  Procedure(s) performed (including Critical Care):  Procedures  Medications - No data to display  ____________________________________________   MDM / ED COURSE   25 year old female presents to the ED with a week of hematochezia and hematemesis, without evidence of significant hemoglobin drop or continued bleeding  and amenable to outpatient management.  She has a history of gastritis and internal hemorrhoids at baseline, and I anticipate these are the etiologies of her symptoms.  No abdominal tenderness and less likely diverticulitis.  Normal vitals and blood work is benign.  No barriers to outpatient management.  We will refill her prescription for Carafate and refer her back to GI.  Return precautions for the ED discussed.     ____________________________________________   FINAL CLINICAL IMPRESSION(S) / ED DIAGNOSES  Final diagnoses:  Hematochezia  Internal hemorrhoid, bleeding      ED Discharge Orders          Ordered    sucralfate (CARAFATE) 1 g tablet  3 times daily with meals & bedtime        09/05/21 1513             Danyal Whitenack   Note:  This document was prepared using Systems analyst and may include unintentional dictation errors.    Vladimir Crofts, MD 09/05/21 [phone]

## 2021-09-05 NOTE — Discharge Instructions (Signed)
Follow-up with your GI doctor in the clinic.  For start taking sucralfate/Carafate in addition to your Protonix and Zofran, to help with your gastritis.    Return to the ED with any worsening symptoms or passing out all the way.

## 2021-09-05 NOTE — ED Triage Notes (Signed)
Pt to ED for vomiting blood and blood in stool for past week. Reports blood in stool x3 in past 24 hours.  Pt in NAD Takes protonix for GERD

## 2021-09-06 ENCOUNTER — Other Ambulatory Visit (HOSPITAL_COMMUNITY): Payer: Self-pay

## 2021-09-07 ENCOUNTER — Emergency Department (HOSPITAL_COMMUNITY)
Admission: EM | Admit: 2021-09-07 | Discharge: 2021-09-07 | Discharge status: Against Medical Advice | Payer: No Typology Code available for payment source

## 2021-09-07 NOTE — ED Notes (Signed)
Registration states this patient handed them labels and stated they were leaving

## 2021-09-12 ENCOUNTER — Other Ambulatory Visit: Payer: Self-pay

## 2021-09-12 ENCOUNTER — Encounter: Payer: Self-pay | Admitting: Family Medicine

## 2021-09-12 ENCOUNTER — Ambulatory Visit (INDEPENDENT_AMBULATORY_CARE_PROVIDER_SITE_OTHER): Payer: No Typology Code available for payment source | Admitting: Family Medicine

## 2021-09-12 ENCOUNTER — Other Ambulatory Visit (HOSPITAL_COMMUNITY): Payer: Self-pay

## 2021-09-12 VITALS — BP 108/62 | HR 64 | Temp 97.6°F | Ht 65.0 in | Wt 192.1 lb

## 2021-09-12 DIAGNOSIS — R1084 Generalized abdominal pain: Secondary | ICD-10-CM | POA: Diagnosis not present

## 2021-09-12 DIAGNOSIS — Z23 Encounter for immunization: Secondary | ICD-10-CM | POA: Diagnosis not present

## 2021-09-12 DIAGNOSIS — F3181 Bipolar II disorder: Secondary | ICD-10-CM | POA: Diagnosis not present

## 2021-09-12 DIAGNOSIS — R197 Diarrhea, unspecified: Secondary | ICD-10-CM

## 2021-09-12 LAB — CBC WITH DIFFERENTIAL/PLATELET
Basophils Absolute: 0.1 10*3/uL (ref 0.0–0.1)
Basophils Relative: 1.5 % (ref 0.0–3.0)
Eosinophils Absolute: 0.1 10*3/uL (ref 0.0–0.7)
Eosinophils Relative: 1.4 % (ref 0.0–5.0)
HCT: 40.7 % (ref 36.0–46.0)
Hemoglobin: 13.6 g/dL (ref 12.0–15.0)
Lymphocytes Relative: 30.2 % (ref 12.0–46.0)
Lymphs Abs: 2.1 10*3/uL (ref 0.7–4.0)
MCHC: 33.4 g/dL (ref 30.0–36.0)
MCV: 84.1 fl (ref 78.0–100.0)
Monocytes Absolute: 0.4 10*3/uL (ref 0.1–1.0)
Monocytes Relative: 5.9 % (ref 3.0–12.0)
Neutro Abs: 4.3 10*3/uL (ref 1.4–7.7)
Neutrophils Relative %: 61 % (ref 43.0–77.0)
Platelets: 192 10*3/uL (ref 150.0–400.0)
RBC: 4.84 Mil/uL (ref 3.87–5.11)
RDW: 14.4 % (ref 11.5–15.5)
WBC: 7 10*3/uL (ref 4.0–10.5)

## 2021-09-12 LAB — COMPREHENSIVE METABOLIC PANEL
ALT: 11 U/L (ref 0–35)
AST: 12 U/L (ref 0–37)
Albumin: 4.4 g/dL (ref 3.5–5.2)
Alkaline Phosphatase: 63 U/L (ref 39–117)
BUN: 13 mg/dL (ref 6–23)
CO2: 28 mEq/L (ref 19–32)
Calcium: 9.5 mg/dL (ref 8.4–10.5)
Chloride: 104 mEq/L (ref 96–112)
Creatinine, Ser: 0.96 mg/dL (ref 0.40–1.20)
GFR: 82.49 mL/min (ref >60.00)
Glucose, Bld: 87 mg/dL (ref 70–99)
Potassium: 4.3 mEq/L (ref 3.5–5.1)
Sodium: 138 mEq/L (ref 135–145)
Total Bilirubin: 0.4 mg/dL (ref 0.2–1.2)
Total Protein: 7.4 g/dL (ref 6.0–8.3)

## 2021-09-12 LAB — TSH: TSH: 2.7 u[IU]/mL (ref 0.35–5.50)

## 2021-09-12 LAB — LIPASE: Lipase: 10 U/L — ABNORMAL LOW (ref 11.0–59.0)

## 2021-09-12 LAB — SEDIMENTATION RATE: Sed Rate: 12 mm/hr (ref 0–20)

## 2021-09-12 MED ORDER — SUCRALFATE 1 GM/10ML PO SUSP
1.0000 g | Freq: Three times a day (TID) | ORAL | 1 refills | Status: DC
  Filled 2021-09-12: qty 420, 11d supply, fill #0

## 2021-09-12 NOTE — Assessment & Plan Note (Signed)
Recurrent blood per rectum with diarrheal episode.  ?int hem related bleed. See below.

## 2021-09-12 NOTE — Assessment & Plan Note (Signed)
Overall stable period without recent change in regimen.

## 2021-09-12 NOTE — Assessment & Plan Note (Signed)
2+ wks of significant generalized abdominal pain associated with nausea, vomiting, and bloody stools. Check contrasted CT abd/pelvis as well as further labs today r/o celiac disease, update CBC r/o acute blood loss anemia.  Reviewed ER records as well as last year's reassuring GI workup with EGD/colonoscopy showing int hemorrhoid, gastritis and reflux esophagitis. H/o similar GI illnesses with unrevealing workup. She continues PPI BID. Advised avoid NSAIDs, alcohol, continue PPI, add carafate solution if affordable.  Will refer back to GI for further evaluation.  Pt agrees with plan.

## 2021-09-12 NOTE — Progress Notes (Signed)
Patient ID: Victoria Simmons, female    DOB: 12-08-1995, 25 y.o.   MRN: [id number]  This visit was conducted in person.  BP 108/62   Pulse 64   Temp 97.6 F (36.4 C) (Temporal)   Ht 5\' 5"  (1.651 m)   Wt 192 lb 1 oz (87.1 kg)   LMP 08/27/2021   SpO2 97%   BMI 31.96 kg/m   BP Readings from Last 3 Encounters:  09/12/21 108/62  09/07/21 113/80  09/05/21 97/68    CC: ER f/u visit  Subjective:   HPI: Victoria Simmons is a 25 y.o. female presenting on 09/12/2021 for Rectal Bleeding (C/o rectal bleeding, abd pain and vomiting blood.  Seen on 09/05/20 at [facility] ED, dx hematochezia; internal hemorrhoid, bleeding.  Told to f/u with GI.  Requesting referral. Accompanied by wife, Tod Persia.)   Recent ER eval 09/05/2021 for 1 wk of hematochezia and hematemesis associated with abdominal cramping pain. Bleeding started 08/29/2021, last bloody stool was 09/09/2021. Records reviewed. Has been feeling very lightheaded as well especially at work, no syncope. Works at [employer]. Ongoing nausea, last vomiting episode was Saturday. Ongoing diffuse abdominal pain 7/10, described as sharp stabbing and gnawing. Brings pictures showing significant blood in commode.   Also with blood in urine however no UTI symptoms or vaginal discharge.  Not currently getting period since IUD placed (12/2018).   She continues protonix 40mg  bid, zofran PRN nausea. Not on carafate - didn't feel tablets were helpful.   NSAIDs - she started taking ibuprofen 800mg  twice daily recently.  Alcohol - no drinking.  Caffeine - none Spicy foods - limited  No recent diet changes - she is cutting out dairy to see if any improvement in GI symptoms.  No new medicines, vitamins, supplements.   H/o Bipolar 2 managed with abilify 10mg  nightly, fluvoxamine 200mg  nightly, lamictal 150mg  nightly, trazodone 150mg  nightly, and hydroxyzine TID PRN anxiety. Sees psychiatrist (Dr Jeani Sow at [facility]).   I last saw patient 03/2020 for bloody  diarrhea. Had positive fecal calprotectin at that time. Seen by GI s/p reassuring luminal evaluation: Colonoscopy 08/2020 - TA, int hem, benign biopsies, rpt 5 yrs (Armbruster) EGD 08/2020 - LA grade A reflux esophagitis and antral gastritis, H pylori neg, treated with PPI BID and carafate course (Armbruster)     Relevant past medical, surgical, family and social history reviewed and updated as indicated. Interim medical history since our last visit reviewed. Allergies and medications reviewed and updated. Outpatient Medications Prior to Visit  Medication Sig Dispense Refill   ARIPiprazole (ABILIFY) 10 MG tablet Take 1 tablet (10 mg total) by mouth at bedtime. 30 tablet 1   fluvoxaMINE (LUVOX) 100 MG tablet Take 2 tablets (200 mg total) by mouth at bedtime. 60 tablet 1   hydrOXYzine (ATARAX/VISTARIL) 25 MG tablet TAKE 1 TABLET (25 MG TOTAL) BY MOUTH 3 (THREE) TIMES DAILY AS NEEDED FOR ANXIETY. 75 tablet 1   lamoTRIgine (LAMICTAL) 100 MG tablet Take 1.5 tablets (150 mg total) by mouth at bedtime. 45 tablet 1   Levonorgestrel (KYLEENA IU) by Intrauterine route.     sucralfate (CARAFATE) 1 g tablet Take 1 tablet (1 g total) by mouth 4 (four) times daily -  with meals and at bedtime. 120 tablet 0   traZODone (DESYREL) 100 MG tablet Take 1.5 tablets (150 mg total) by mouth at bedtime. 45 tablet 1   meloxicam (MOBIC) 15 MG tablet Take 1 tablet (15 mg total) by mouth daily. 30 tablet  2   pantoprazole (PROTONIX) 40 MG tablet TAKE 1 TABLET (40 MG TOTAL) BY MOUTH 2 (TWO) TIMES DAILY. 60 tablet 3   sucralfate (CARAFATE) 1 g tablet TAKE 1 TABLET (1 G TOTAL) BY MOUTH EVERY 6 (SIX) HOURS. 30 tablet 3   No facility-administered medications prior to visit.     Per HPI unless specifically indicated in ROS section below Review of Systems  Objective:  BP 108/62   Pulse 64   Temp 97.6 F (36.4 C) (Temporal)   Ht 5\' 5"  (1.651 m)   Wt 192 lb 1 oz (87.1 kg)   LMP 08/27/2021   SpO2 97%   BMI 31.96 kg/m    Wt Readings from Last 3 Encounters:  09/12/21 192 lb 1 oz (87.1 kg)  09/05/21 190 lb (86.2 kg)  02/16/21 195 lb (88.5 kg)      Physical Exam Vitals and nursing note reviewed.  Constitutional:      Appearance: Normal appearance. She is not ill-appearing.  Eyes:     Extraocular Movements: Extraocular movements intact.     Pupils: Pupils are equal, round, and reactive to light.  Cardiovascular:     Rate and Rhythm: Normal rate and regular rhythm.     Pulses: Normal pulses.     Heart sounds: Normal heart sounds. No murmur heard. Pulmonary:     Effort: Pulmonary effort is normal. No respiratory distress.     Breath sounds: Normal breath sounds. No wheezing, rhonchi or rales.  Abdominal:     General: Bowel sounds are normal. There is no distension.     Palpations: Abdomen is soft. There is no mass.     Tenderness: There is generalized abdominal tenderness (moderate). There is no right CVA tenderness, left CVA tenderness, guarding or rebound. Negative signs include Murphy's sign.     Hernia: No hernia is present.  Musculoskeletal:     Right lower leg: No edema.     Left lower leg: No edema.  Skin:    General: Skin is warm and dry.     Findings: No rash.  Neurological:     Mental Status: She is alert.  Psychiatric:        Mood and Affect: Mood normal.        Behavior: Behavior normal.      Results for orders placed or performed during the hospital encounter of 09/05/21  Lipase, blood  Result Value Ref Range   Lipase 24 11 - 51 U/L  Comprehensive metabolic panel  Result Value Ref Range   Sodium 135 135 - 145 mmol/L   Potassium 3.8 3.5 - 5.1 mmol/L   Chloride 105 98 - 111 mmol/L   CO2 24 22 - 32 mmol/L   Glucose, Bld 94 70 - 99 mg/dL   BUN 14 6 - 20 mg/dL   Creatinine, Ser 0.95 0.44 - 1.00 mg/dL   Calcium 9.1 8.9 - 10.3 mg/dL   Total Protein 7.8 6.5 - 8.1 g/dL   Albumin 4.3 3.5 - 5.0 g/dL   AST 14 (L) 15 - 41 U/L   ALT 14 0 - 44 U/L   Alkaline Phosphatase 62 38 - 126  U/L   Total Bilirubin 0.8 0.3 - 1.2 mg/dL   GFR, Estimated >60 >60 mL/min   Anion gap 6 5 - 15  CBC  Result Value Ref Range   WBC 8.1 4.0 - 10.5 K/uL   RBC 4.91 3.87 - 5.11 MIL/uL   Hemoglobin 13.5 12.0 - 15.0 g/dL  HCT 41.6 36.0 - 46.0 %   MCV 84.7 80.0 - 100.0 fL   MCH 27.5 26.0 - 34.0 pg   MCHC 32.5 30.0 - 36.0 g/dL   RDW 13.5 11.5 - 15.5 %   Platelets 194 150 - 400 K/uL   nRBC 0.0 0.0 - 0.2 %  Urinalysis, Routine w reflex microscopic Urine, Clean Catch  Result Value Ref Range   Color, Urine YELLOW YELLOW   APPearance CLEAR CLEAR   Specific Gravity, Urine >1.030 (H) 1.005 - 1.030   pH 5.5 5.0 - 8.0   Glucose, UA NEGATIVE NEGATIVE mg/dL   Hgb urine dipstick MODERATE (A) NEGATIVE   Bilirubin Urine NEGATIVE NEGATIVE   Ketones, ur NEGATIVE NEGATIVE mg/dL   Protein, ur NEGATIVE NEGATIVE mg/dL   Nitrite NEGATIVE NEGATIVE   Leukocytes,Ua NEGATIVE NEGATIVE  Urinalysis, Microscopic (reflex)  Result Value Ref Range   RBC / HPF 0-5 0 - 5 RBC/hpf   WBC, UA 0-5 0 - 5 WBC/hpf   Bacteria, UA RARE (A) NONE SEEN   Squamous Epithelial / LPF 0-5 0 - 5   Mucus PRESENT   POC urine preg, ED  Result Value Ref Range   Preg Test, Ur NEGATIVE NEGATIVE    Assessment & Plan:  This visit occurred during the SARS-CoV-2 public health emergency.  Safety protocols were in place, including screening questions prior to the visit, additional usage of staff PPE, and extensive cleaning of exam room while observing appropriate contact time as indicated for disinfecting solutions.   Problem List Items Addressed This Visit     Bipolar 2 disorder, major depressive episode ([place])    Overall stable period without recent change in regimen.       Bloody diarrhea    Recurrent blood per rectum with diarrheal episode.  ?int hem related bleed. See below.       Relevant Orders   CBC with Differential/Platelet   Celiac Pnl 2 rflx Endomysial Ab Ttr   Sedimentation rate   CT Abdomen Pelvis W Contrast    Ambulatory referral to Gastroenterology   Generalized abdominal pain - Primary    2+ wks of significant generalized abdominal pain associated with nausea, vomiting, and bloody stools. Check contrasted CT abd/pelvis as well as further labs today r/o celiac disease, update CBC r/o acute blood loss anemia.  Reviewed ER records as well as last year's reassuring GI workup with EGD/colonoscopy showing int hemorrhoid, gastritis and reflux esophagitis. H/o similar GI illnesses with unrevealing workup. She continues PPI BID. Advised avoid NSAIDs, alcohol, continue PPI, add carafate solution if affordable.  Will refer back to GI for further evaluation.  Pt agrees with plan.       Relevant Orders   TSH   Comprehensive metabolic panel   CBC with Differential/Platelet   Celiac Pnl 2 rflx Endomysial Ab Ttr   Lipase   Sedimentation rate   CT Abdomen Pelvis W Contrast   Ambulatory referral to Gastroenterology   Other Visit Diagnoses     Need for influenza vaccination       Relevant Orders   Flu Vaccine QUAD 74mo+IM (Fluarix, Fluzone & Alfiuria Quad PF) (Completed)        Meds ordered this encounter  Medications   sucralfate (CARAFATE) 1 GM/10ML suspension    Sig: Take 10 mLs (1 g total) by mouth 4 (four) times daily -  with meals and at bedtime. As needed    Dispense:  420 mL    Refill:  1  Orders Placed This Encounter  Procedures   CT Abdomen Pelvis W Contrast    Standing Status:   Future    Standing Expiration Date:   09/12/2022    Order Specific Question:   If indicated for the ordered procedure, I authorize the administration of contrast media per Radiology protocol    Answer:   Yes    Order Specific Question:   Is patient pregnant?    Answer:   No    Order Specific Question:   Preferred imaging location?    Answer:   [address]    Order Specific Question:   Is Oral Contrast requested for this exam?    Answer:   Yes, Per Radiology protocol   Flu Vaccine QUAD 79mo+IM  (Fluarix, Fluzone & Alfiuria Quad PF)   TSH   Comprehensive metabolic panel   CBC with Differential/Platelet   Celiac Pnl 2 rflx Endomysial Ab Ttr   Lipase   Sedimentation rate   Ambulatory referral to Gastroenterology    Referral Priority:   Routine    Referral Type:   Consultation    Referral Reason:   Specialty Services Required    Number of Visits Requested:   1     Patient Instructions  Flu shot today  Labs today  We will check CT abdomen/pelvis.  We will refer you to GI.  Continue pantoprazole 40mg  twice daily every day - take 30 min before a meal for best effect. Add carafate solution with meals to help protect stomach. Continue zofran as needed for nausea.  Keep me updated with symptoms.   Follow up plan: Return if symptoms worsen or fail to improve.  Ria Bush, MD

## 2021-09-12 NOTE — Patient Instructions (Addendum)
Flu shot today  Labs today  We will check CT abdomen/pelvis.  We will refer you to GI.  Continue pantoprazole 40mg  twice daily every day - take 30 min before a meal for best effect. Add carafate solution with meals to help protect stomach. Continue zofran as needed for nausea.  Keep me updated with symptoms.

## 2021-09-14 ENCOUNTER — Other Ambulatory Visit: Payer: Self-pay | Admitting: Child and Adolescent Psychiatry

## 2021-09-14 DIAGNOSIS — F3181 Bipolar II disorder: Secondary | ICD-10-CM

## 2021-09-15 ENCOUNTER — Other Ambulatory Visit (HOSPITAL_COMMUNITY): Payer: Self-pay

## 2021-09-15 MED ORDER — TRAZODONE HCL 100 MG PO TABS
150.0000 mg | ORAL_TABLET | Freq: Every day | ORAL | 1 refills | Status: DC
  Filled 2021-09-15: qty 45, 30d supply, fill #0
  Filled 2021-10-26: qty 45, 30d supply, fill #1

## 2021-09-15 MED ORDER — LAMOTRIGINE 100 MG PO TABS
150.0000 mg | ORAL_TABLET | Freq: Every day | ORAL | 1 refills | Status: DC
  Filled 2021-09-15: qty 45, 30d supply, fill #0
  Filled 2021-10-26: qty 45, 30d supply, fill #1

## 2021-09-20 LAB — CELIAC PNL 2 RFLX ENDOMYSIAL AB TTR
(tTG) Ab, IgA: <1 U/mL
(tTG) Ab, IgG: <1 U/mL
Endomysial Ab IgA: NEGATIVE
Gliadin IgA: 5.5 U/mL
Gliadin IgG: 1.1 U/mL
Immunoglobulin A: 271 mg/dL (ref 47–310)

## 2021-09-20 NOTE — Addendum Note (Signed)
Addended by: Virl Cagey on: 09/20/2021 03:49 PM   Modules accepted: Orders

## 2021-09-21 ENCOUNTER — Other Ambulatory Visit: Payer: Self-pay

## 2021-09-21 ENCOUNTER — Other Ambulatory Visit (HOSPITAL_COMMUNITY): Payer: Self-pay

## 2021-09-21 ENCOUNTER — Ambulatory Visit (INDEPENDENT_AMBULATORY_CARE_PROVIDER_SITE_OTHER)
Admission: RE | Admit: 2021-09-21 | Discharge: 2021-09-21 | Disposition: A | Discharge status: Home / Self Care | Payer: No Typology Code available for payment source | Source: Ambulatory Visit | Attending: Family Medicine | Admitting: Family Medicine

## 2021-09-21 DIAGNOSIS — R197 Diarrhea, unspecified: Secondary | ICD-10-CM

## 2021-09-21 DIAGNOSIS — R1084 Generalized abdominal pain: Secondary | ICD-10-CM | POA: Diagnosis not present

## 2021-09-21 MED ORDER — IOHEXOL 300 MG/ML  SOLN
100.0000 mL | Freq: Once | INTRAMUSCULAR | Status: AC | PRN
  Administered 2021-09-21: 100 mL via INTRAVENOUS

## 2021-10-04 ENCOUNTER — Other Ambulatory Visit: Payer: Self-pay

## 2021-10-04 ENCOUNTER — Ambulatory Visit (INDEPENDENT_AMBULATORY_CARE_PROVIDER_SITE_OTHER): Payer: No Typology Code available for payment source | Admitting: Licensed Clinical Social Worker

## 2021-10-04 DIAGNOSIS — F3181 Bipolar II disorder: Secondary | ICD-10-CM | POA: Diagnosis not present

## 2021-10-04 DIAGNOSIS — Z7289 Other problems related to lifestyle: Secondary | ICD-10-CM

## 2021-10-04 NOTE — Progress Notes (Addendum)
Virtual Visit via Video Note  I connected with Victoria Simmons on 10/04/21 at  9:00 AM EST by a video enabled telemedicine application and verified that I am speaking with the correct person using two identifiers.  Location: Patient: home Provider: remote office [place], [place])   I discussed the limitations of evaluation and management by telemedicine and the availability of in person appointments. The patient expressed understanding and agreed to proceed.   I discussed the assessment and treatment plan with the patient. The patient was provided an opportunity to ask questions and all were answered. The patient agreed with the plan and demonstrated an understanding of the instructions.   The patient was advised to call back or seek an in-person evaluation if the symptoms worsen or if the condition fails to improve as anticipated.  I provided 30 minutes of non-face-to-face time during this encounter.   [place], LCSW  THERAPIST PROGRESS NOTE  Session Time: [phone]  Participation Level: Active  Behavioral Response: Neat and Well GroomedAlertAnxious  Type of Therapy: Individual Therapy  Treatment Goals addressed:  Problem: Alleviate depressive/manic symptoms and return to improved levels of effective functioning. Goal: LTG: Stabilize mood and increase goal-directed behavior: per pt self report Outcome: Not Progressing Note: Pt did have one self injurious episode triggered by frustration/feeling overwhelmed.  Goal: STG: [place] WILL ATTEND AT LEAST 80% OF SCHEDULED FOLLOW-UP OPT APPOINTMENTS Outcome: Progressing    Interventions:    Intervention: Assess emotional status and coping mechanisms Intervention: Discuss self-management skills Intervention: ENCOURAGE [place] TO FOLLOW PERSONAL SAFETY PLAN WHEN IN CRISIS    Summary: Victoria Simmons is a 25 y.o. female who presents with symptoms consistent with bipolar II diagnosis.   Delcie primary concern for today's visit  is to get clearance to return to work. Pt reports that she recently felt frustrated at home and cut herself with her nails across her wrist. 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 Pts supervisor (who has been very supportive of pts emotional well being in the past) stated that pt was not safe to be working around children unless she has a letter stating the she is safe to work. Pt showed cuts to clinician via video and cuts did not seem to be oozing, bleeding, or overtly obvious. Pt states that she will cover them with bandages so that the children will not ask questions about the cuts.   LCSW clinician assessed pt using [place], [place]. Pt states that she is not suicidal and has not had any suicidal thoughts since her birthday, which was [birth year]. At that time they were only fleeting due to traumas that have happened around her birthday in the past.   Pt contracts for safety and will contact safe people before making the choice to self harm or worse in the future.  Continued recommendations are as follows: self care behaviors, positive social engagements, focusing on overall work/home/life balance, and focusing on positive physical and emotional wellness.   Suicidal/Homicidal: No  Therapist Response: Pt is continuing to apply interventions learned in session into daily life situations. Pt is currently on track to meet goals utilizing interventions mentioned above. Personal growth and progress noted. Treatment to continue as indicated.   Plan: Return again in 4 weeks.  Diagnosis: Axis I: Bipolar 2, depressed    Axis II: No diagnosis    [place], LCSW 10/04/2021

## 2021-10-04 NOTE — Progress Notes (Signed)
°   10/04/21 8022  Step One: Remove Access  What other actions can I take to limit ways that I can hurt myself? Have alternative behaviors to self harm (fidget ring, bracelet)  Step Two: Warning Signs  What are my specific triggers or warning signs a crisis is developing? Feeling hopeless things won't ever get better;Feeling down, sad, or crying;Feeling alone or rejected;Feeling trapped, no way out;Withdrawing, feeling isolated;Feeling anxious, agitated;Feeling overwhelmed by life circumstances, e.g, finances  Step Three: Distracting Activities  What engaging and calming activities can I do to distract myself? Go online, play video games;Listen to music;Read;Do something nice for someone else;Meditate, pray;Go for a walk, exercise;Do a hobby, favorite activity  Step Four: Distracting People and Places  Who helps me to take my mind off of my problems? My wife  Where can I go to take my mind off my problems? quiet, safe space either at work or at home  Step Five: [place] family members or friends can I talk to to help me feel better? wife  Who can help me get through the crisis? myself, my behavioral health team, my wife, my family

## 2021-10-04 NOTE — Plan of Care (Signed)
°  Problem: Alleviate depressive/manic symptoms and return to improved levels of effective functioning. Goal: LTG: Stabilize mood and increase goal-directed behavior: per pt self report Outcome: Not Progressing Note: Pt did have one self injurious episode triggered by frustration/feeling overwhelmed.  Goal: STG: Blannie WILL ATTEND AT LEAST 80% OF SCHEDULED FOLLOW-UP OPT APPOINTMENTS Outcome: Progressing Intervention: Assess emotional status and coping mechanisms Intervention: Discuss self-management skills Intervention: ENCOURAGE Victoria Simmons TO FOLLOW PERSONAL SAFETY PLAN WHEN IN CRISIS

## 2021-10-10 ENCOUNTER — Other Ambulatory Visit: Payer: Self-pay

## 2021-10-10 ENCOUNTER — Ambulatory Visit (INDEPENDENT_AMBULATORY_CARE_PROVIDER_SITE_OTHER): Payer: No Typology Code available for payment source | Admitting: Licensed Clinical Social Worker

## 2021-10-10 DIAGNOSIS — Z7289 Other problems related to lifestyle: Secondary | ICD-10-CM

## 2021-10-10 DIAGNOSIS — F3181 Bipolar II disorder: Secondary | ICD-10-CM

## 2021-10-10 NOTE — Plan of Care (Signed)
°  Problem: Alleviate depressive/manic symptoms and return to improved levels of effective functioning. Goal: LTG: Stabilize mood and increase goal-directed behavior: per pt self report Outcome: Not Progressing Note: Fluctuating mood due to recent job loss Goal: STG: Victoria Simmons WILL ATTEND AT LEAST 80% OF SCHEDULED FOLLOW-UP OPT APPOINTMENTS Outcome: Progressing Note: Pt was 15 min late for appt--did not link in on video visit

## 2021-10-10 NOTE — Progress Notes (Addendum)
Virtual Visit via Audio Note  I connected with Victoria Simmons on 10/10/21 at  9:00 AM EST by an audio enabled telemedicine application and verified that I am speaking with the correct person using two identifiers.  Location: Patient: home Provider: remote office [place], [place])   I discussed the limitations of evaluation and management by telemedicine and the availability of in person appointments. The patient expressed understanding and agreed to proceed.   I discussed the assessment and treatment plan with the patient. The patient was provided an opportunity to ask questions and all were answered. The patient agreed with the plan and demonstrated an understanding of the instructions.   The patient was advised to call back or seek an in-person evaluation if the symptoms worsen or if the condition fails to improve as anticipated.  I provided 30 minutes of non-face-to-face time during this encounter.   [place], LCSW  THERAPIST PROGRESS NOTE  Session Time: [phone]  Participation Level: Active  Behavioral Response: Neat and Well GroomedAlertAnxious  Type of Therapy: Individual Therapy  Treatment Goals addressed:  Problem: Alleviate depressive/manic symptoms and return to improved levels of effective functioning.  Goal: LTG: Stabilize mood and increase goal-directed behavior: per pt self report Outcome: Not Progressing Note: Fluctuating mood due to recent job loss  Goal: STG: Victoria Simmons WILL ATTEND AT LEAST 80% OF SCHEDULED FOLLOW-UP OPT APPOINTMENTS Outcome: Progressing Note: Pt was 15 min late for appt--did not link in on video visit  Interventions:    Intervention: Assess emotional status and coping mechanisms  Intervention: Discuss self-management skills  Intervention: ENCOURAGE Ted[place] TO FOLLOW PERSONAL SAFETY PLAN WHEN IN CRISIS    Summary: Victoria Simmons is a 26 y.o. female who presents with symptoms consistent with bipolar II diagnosis. Pt reports that  mood has been fluctuating and stress levels have also been fluctuating recently.  Allowed pt to explore and express thoughts and feelings associated with recent life situations and external stressors. Pt reports that she was fired from her job yesterday due to concerns that happened with the cutting incident at work recently. Pt reports that her supervisor told her that she was letting her go "to focus on your mental health". Pt states that her supervisor has been very supportive of her in the past, so being terminated was a shock to her. Explored the situation to see if something happened at work--pt states that a child saw the scratches on her wrist and told a parent about it--the parent got upset and talked with the director about her concerns. 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  Pt did not specify what their specific concerns were, and did not disclose any additional details as to why she was terminated.  Pt reports that because of her recent job loss, relationships between herself and her wife have been strained. Pt also reports that relationship with parents is also strained because her parents are worried about her living in their house with no job.. Pt reports that she has applied for 5 jobs since losing her job yesterday.  Praised pts motivation and initiative and encouraged her to continue applying and following up with jobs/potential employers.  Pt states that other than typical stress, pt does not feel any SI or urge to cut or harm herself.   Encouraged pt to reach out to psychiatrist for a follow up appt. Last appt was in 06/2021.  Continued recommendations are as  follows: self care behaviors, positive social engagements, focusing on overall work/home/life balance, and focusing on positive physical and emotional wellness.   Suicidal/Homicidal: No  Therapist Response: Pt is continuing to apply  interventions learned in session into daily life situations. Pt is currently on track to meet goals utilizing interventions mentioned above. Personal growth and progress noted. Treatment to continue as indicated.   Plan: Return again in 4 weeks.  Diagnosis: Axis I: Bipolar 2, depressed    Axis II: No diagnosis    Victoria Bo Kalese Ensz, LCSW 10/10/2021

## 2021-10-13 ENCOUNTER — Other Ambulatory Visit (HOSPITAL_COMMUNITY): Payer: Self-pay

## 2021-10-26 ENCOUNTER — Other Ambulatory Visit (HOSPITAL_COMMUNITY): Payer: Self-pay

## 2021-10-31 ENCOUNTER — Other Ambulatory Visit: Payer: Self-pay | Admitting: Child and Adolescent Psychiatry

## 2021-10-31 ENCOUNTER — Other Ambulatory Visit (HOSPITAL_COMMUNITY): Payer: Self-pay

## 2021-10-31 DIAGNOSIS — F411 Generalized anxiety disorder: Secondary | ICD-10-CM

## 2021-10-31 MED ORDER — FLUVOXAMINE MALEATE 100 MG PO TABS
200.0000 mg | ORAL_TABLET | Freq: Every evening | ORAL | 0 refills | Status: DC
  Filled 2021-10-31: qty 60, 30d supply, fill #0

## 2021-11-03 ENCOUNTER — Other Ambulatory Visit: Payer: Self-pay

## 2021-11-03 ENCOUNTER — Other Ambulatory Visit (HOSPITAL_COMMUNITY): Payer: Self-pay

## 2021-11-03 ENCOUNTER — Encounter: Payer: Self-pay | Admitting: Family Medicine

## 2021-11-03 ENCOUNTER — Ambulatory Visit (INDEPENDENT_AMBULATORY_CARE_PROVIDER_SITE_OTHER): Payer: No Typology Code available for payment source | Admitting: Family Medicine

## 2021-11-03 VITALS — BP 116/68 | HR 93 | Temp 98.2°F | Ht 65.0 in | Wt 192.2 lb

## 2021-11-03 DIAGNOSIS — L720 Epidermal cyst: Secondary | ICD-10-CM | POA: Diagnosis not present

## 2021-11-03 DIAGNOSIS — L089 Local infection of the skin and subcutaneous tissue, unspecified: Secondary | ICD-10-CM | POA: Diagnosis not present

## 2021-11-03 DIAGNOSIS — K2100 Gastro-esophageal reflux disease with esophagitis, without bleeding: Secondary | ICD-10-CM | POA: Diagnosis not present

## 2021-11-03 MED ORDER — DOXYCYCLINE HYCLATE 100 MG PO TABS
100.0000 mg | ORAL_TABLET | Freq: Two times a day (BID) | ORAL | 0 refills | Status: DC
  Filled 2021-11-03: qty 10, 5d supply, fill #0

## 2021-11-03 NOTE — Progress Notes (Signed)
Patient ID: Victoria Simmons, female    DOB: 10-Jun-1996, 26 y.o.   MRN: [id number]  This visit was conducted in person.  BP 116/68    Pulse 93    Temp 98.2 F (36.8 C) (Temporal)    Ht 5\' 5"  (1.651 m)    Wt 192 lb 4 oz (87.2 kg)    SpO2 95%    BMI 31.99 kg/m    CC: check enlarging cyst  Subjective:   HPI: Victoria Simmons is a 26 y.o. female presenting on 11/03/2021 for Cyst (C/o cyst on lt side of neck. Has had for yrs.  About 1 wk ago had increased in size.  Accompanied by wife, Victoria Simmons. )   Chronic h/o tiny cyst to left side of neck for 10+ years, then a week ago got very enlarged. Very tender. Did improve on its own over the last few days.  Area has not drained.  Treated with vicks vaporub.   Chronic nausea in the mornings.  No fevers/chills, abd pain.   LMP - 08/2021, has IUD so stays irregular.      Relevant past medical, surgical, family and social history reviewed and updated as indicated. Interim medical history since our last visit reviewed. Allergies and medications reviewed and updated. Outpatient Medications Prior to Visit  Medication Sig Dispense Refill   ARIPiprazole (ABILIFY) 10 MG tablet Take 1 tablet (10 mg total) by mouth at bedtime. 30 tablet 1   fluvoxaMINE (LUVOX) 100 MG tablet Take 2 tablets (200 mg total) by mouth at bedtime. 60 tablet 0   hydrOXYzine (ATARAX/VISTARIL) 25 MG tablet TAKE 1 TABLET (25 MG TOTAL) BY MOUTH 3 (THREE) TIMES DAILY AS NEEDED FOR ANXIETY. 75 tablet 1   lamoTRIgine (LAMICTAL) 100 MG tablet Take 1.5 tablets (150 mg total) by mouth at bedtime. 45 tablet 1   Levonorgestrel (KYLEENA IU) by Intrauterine route.     traZODone (DESYREL) 100 MG tablet Take 1.5 tablets (150 mg total) by mouth at bedtime. 45 tablet 1   pantoprazole (PROTONIX) 40 MG tablet TAKE 1 TABLET (40 MG TOTAL) BY MOUTH 2 (TWO) TIMES DAILY. 60 tablet 3   sucralfate (CARAFATE) 1 g tablet Take 1 tablet (1 g total) by mouth 4 (four) times daily -  with meals and at bedtime. 120  tablet 0   sucralfate (CARAFATE) 1 GM/10ML suspension Take 10 mLs (1 g total) by mouth 4 (four) times daily -  with meals and at bedtime. As needed 420 mL 1   No facility-administered medications prior to visit.     Per HPI unless specifically indicated in ROS section below Review of Systems  Objective:  BP 116/68    Pulse 93    Temp 98.2 F (36.8 C) (Temporal)    Ht 5\' 5"  (1.651 m)    Wt 192 lb 4 oz (87.2 kg)    SpO2 95%    BMI 31.99 kg/m   Wt Readings from Last 3 Encounters:  11/03/21 192 lb 4 oz (87.2 kg)  09/12/21 192 lb 1 oz (87.1 kg)  09/05/21 190 lb (86.2 kg)      Physical Exam Vitals and nursing note reviewed.  Constitutional:      Appearance: Normal appearance. She is not ill-appearing.  HENT:     Head:   Musculoskeletal:     Cervical back: Normal range of motion and neck supple.  Skin:    Findings: Abscess present.     Comments: 1.5cm diameter infected epidermal cyst to left  posteriolateral neck without streaking erythema, with mild induration/fluctuance  Neurological:     Mental Status: She is alert.  Psychiatric:        Mood and Affect: Mood normal.        Behavior: Behavior normal.       Assessment & Plan:  This visit occurred during the SARS-CoV-2 public health emergency.  Safety protocols were in place, including screening questions prior to the visit, additional usage of staff PPE, and extensive cleaning of exam room while observing appropriate contact time as indicated for disinfecting solutions.   Problem List Items Addressed This Visit     Infected epidermoid cyst - Primary    To left neck. Actually improved over last few days but remaining tender and with mild fluctuance.  Offered I&D as best strategy for expedited resolution vs warm compresses/abx course for next few days - she opts to try warm compresses, doxy course, boil-ease cream and limited NSAID course. Red flags to seek further care reviewed.       Reflux esophagitis    Previous EGD with  evidence of gastritis and reflux esophagitis - she continues pantoprazole BID regularly.         Meds ordered this encounter  Medications   doxycycline (VIBRA-TABS) 100 MG tablet    Sig: Take 1 tablet (100 mg total) by mouth 2 (two) times daily.    Dispense:  10 tablet    Refill:  0   No orders of the defined types were placed in this encounter.    Patient instructions: You have inflamed/infected epidermal cyst to left side of neck. Treat with boil-ease numbing cream, warm compresses throughout the day, take ibuprofen 400mg -600mg  with meals for discomfort, and take antibiotic provided today.  If again worsening or enlarging, or fever, worsening nausea, return for incision and drainage.   Follow up plan: Return if symptoms worsen or fail to improve.  Victoria Bush, MD

## 2021-11-03 NOTE — Assessment & Plan Note (Signed)
To left neck. Actually improved over last few days but remaining tender and with mild fluctuance.  Offered I&D as best strategy for expedited resolution vs warm compresses/abx course for next few days - she opts to try warm compresses, doxy course, boil-ease cream and limited NSAID course. Red flags to seek further care reviewed.

## 2021-11-03 NOTE — Patient Instructions (Addendum)
You have inflamed/infected epidermal cyst to left side of neck. Treat with boil-ease numbing cream, warm compresses throughout the day, take ibuprofen 400mg  with meals for discomfort, and take antibiotic provided today.  If again worsening or enlarging, or fever, worsening nausea, return for incision and drainage.   Epidermoid Cyst An epidermoid cyst, also known as epidermal cyst, is a sac made of skin tissue. The sac contains a substance called keratin. Keratin is a protein that is normally secreted through the hair follicles. When keratin becomes trapped in the top layer of skin (epidermis), it can form an epidermoid cyst. Epidermoid cysts can be found anywhere on your body. These cysts are usually harmless (benign), and they may not cause symptoms unless they become inflamed or infected. What are the causes? This condition may be caused by: A blocked hair follicle. A hair that curls and re-enters the skin instead of growing straight out of the skin (ingrown hair). A blocked pore. Irritated skin. An injury to the skin. Certain conditions that are passed along from parent to child (inherited). Human papillomavirus (HPV). This happens rarely when cysts occur on the bottom of the feet. Long-term (chronic) sun damage to the skin. What increases the risk? The following factors may make you more likely to develop an epidermoid cyst: Having acne. Being female. Having an injury to the skin. Being past puberty. Having certain rare genetic disorders. What are the signs or symptoms? The only symptom of this condition may be a small, painless lump underneath the skin. When an epidermal cyst ruptures, it may become inflamed. True infection in cysts is rare. Symptoms may include: Redness. Inflammation. Tenderness. Warmth. Keratin draining from the cyst. Keratin is grayish-white, bad-smelling substance. Pus draining from the cyst. How is this diagnosed? This condition is diagnosed with a physical  exam. In some cases, you may have a sample of tissue (biopsy) taken from your cyst to be examined under a microscope or tested for bacteria. You may be referred to a health care provider who specializes in skin care (dermatologist). How is this treated? If a cyst becomes inflamed, treatment may include: Opening and draining the cyst, done by a health care provider. After draining, minor surgery to remove the rest of the cyst may be done. Taking antibiotic medicine. Having injections of medicines (steroids) that help to reduce inflammation. Having surgery to remove the cyst. Surgery may be done if the cyst: Becomes large. Bothers you. Has a chance of turning into cancer. Do not try to open a cyst yourself. Follow these instructions at home: Medicines If you were prescribed an antibiotic medicine, take it it as told by your health care provider. Do not stop using the antibiotic even if you start to feel better. Take over-the-counter and prescription medicines only as told by your health care provider. General instructions Keep the area around your cyst clean and dry. Wear loose, dry clothing. Avoid touching your cyst. Check your cyst every day for signs of infection. Check for: Redness, swelling, or pain. Fluid or blood. Warmth. Pus or a bad smell. Keep all follow-up visits. This is important. How is this prevented? Wear clean, dry, clothing. Avoid wearing tight clothing. Keep your skin clean and dry. Take showers or baths every day. Contact a health care provider if: Your cyst develops symptoms of infection. Your condition is not improving or is getting worse. You develop a cyst that looks different from other cysts you have had. You have a fever. Get help right away if: Redness spreads  from the cyst into the surrounding area. Summary An epidermoid cyst is a sac made of skin tissue. These cysts are usually harmless (benign), and they may not cause symptoms unless they become  inflamed. If a cyst becomes inflamed, treatment may include surgery to open and drain the cyst, or to remove it. Treatment may also include medicines by mouth or through an injection. Take over-the-counter and prescription medicines only as told by your health care provider. If you were prescribed an antibiotic medicine, take it as told by your health care provider. Do not stop using the antibiotic even if you start to feel better. Contact a health care provider if your condition is not improving or is getting worse. Keep all follow-up visits as told by your health care provider. This is important. This information is not intended to replace advice given to you by your health care provider. Make sure you discuss any questions you have with your health care provider. Document Revised: 12/30/2019 Document Reviewed: 12/30/2019 Elsevier Patient Education  [employer].

## 2021-11-03 NOTE — Assessment & Plan Note (Signed)
Previous EGD with evidence of gastritis and reflux esophagitis - she continues pantoprazole BID regularly.

## 2021-11-13 ENCOUNTER — Other Ambulatory Visit: Payer: Self-pay

## 2021-11-13 ENCOUNTER — Telehealth: Payer: Self-pay | Admitting: Licensed Clinical Social Worker

## 2021-11-13 ENCOUNTER — Ambulatory Visit (INDEPENDENT_AMBULATORY_CARE_PROVIDER_SITE_OTHER): Payer: Self-pay | Admitting: Licensed Clinical Social Worker

## 2021-11-13 DIAGNOSIS — Z91199 Patient's noncompliance with other medical treatment and regimen due to unspecified reason: Secondary | ICD-10-CM

## 2021-11-13 NOTE — Progress Notes (Signed)
LCSW counselor tried to connect with patient for scheduled appointment via [place] video text request and email request; also tried to connect via phone x 2 without success. LCSW counselor left message for patient to call office number to reschedule OPT appointment.   Attempt 1: phone: 11:04a  Attempt 2: phone: 11:09a  Attempt 3:  text and email: 11:14a

## 2021-11-13 NOTE — Telephone Encounter (Signed)
LCSW counselor tried to connect with patient for scheduled appointment via [place] video text request and email request; also tried to connect via phone x 2 without success. LCSW counselor left message for patient to call office number to reschedule OPT appointment.   Attempt 1: phone: 11:04a  Attempt 2: phone: 11:09a  Attempt 3:  text and email: 11:14a

## 2021-11-16 ENCOUNTER — Other Ambulatory Visit: Payer: Self-pay | Admitting: Child and Adolescent Psychiatry

## 2021-11-16 ENCOUNTER — Other Ambulatory Visit (HOSPITAL_COMMUNITY): Payer: Self-pay

## 2021-11-16 DIAGNOSIS — F3181 Bipolar II disorder: Secondary | ICD-10-CM

## 2021-11-16 MED ORDER — ARIPIPRAZOLE 10 MG PO TABS
10.0000 mg | ORAL_TABLET | Freq: Every day | ORAL | 0 refills | Status: DC
  Filled 2021-11-16: qty 15, 15d supply, fill #0

## 2021-11-17 ENCOUNTER — Other Ambulatory Visit (HOSPITAL_COMMUNITY): Payer: Self-pay

## 2021-12-04 ENCOUNTER — Other Ambulatory Visit (HOSPITAL_COMMUNITY): Payer: Self-pay

## 2021-12-04 ENCOUNTER — Other Ambulatory Visit: Payer: Self-pay | Admitting: Child and Adolescent Psychiatry

## 2021-12-04 DIAGNOSIS — F3181 Bipolar II disorder: Secondary | ICD-10-CM

## 2021-12-04 MED ORDER — LAMOTRIGINE 100 MG PO TABS
150.0000 mg | ORAL_TABLET | Freq: Every day | ORAL | 0 refills | Status: DC
  Filled 2021-12-04: qty 10, 7d supply, fill #0

## 2021-12-04 MED ORDER — ARIPIPRAZOLE 10 MG PO TABS
10.0000 mg | ORAL_TABLET | Freq: Every day | ORAL | 0 refills | Status: DC
  Filled 2021-12-04: qty 7, 7d supply, fill #0

## 2021-12-04 MED ORDER — TRAZODONE HCL 100 MG PO TABS
150.0000 mg | ORAL_TABLET | Freq: Every day | ORAL | 0 refills | Status: DC
  Filled 2021-12-04: qty 10, 7d supply, fill #0

## 2021-12-12 ENCOUNTER — Encounter: Payer: Self-pay | Admitting: Emergency Medicine

## 2021-12-12 ENCOUNTER — Other Ambulatory Visit: Payer: Self-pay

## 2021-12-12 ENCOUNTER — Ambulatory Visit
Admission: EM | Admit: 2021-12-12 | Discharge: 2021-12-12 | Disposition: A | Discharge status: Home / Self Care | Payer: No Typology Code available for payment source | Attending: Internal Medicine | Admitting: Internal Medicine

## 2021-12-12 ENCOUNTER — Ambulatory Visit (INDEPENDENT_AMBULATORY_CARE_PROVIDER_SITE_OTHER): Payer: No Typology Code available for payment source

## 2021-12-12 DIAGNOSIS — M25562 Pain in left knee: Secondary | ICD-10-CM | POA: Diagnosis not present

## 2021-12-12 DIAGNOSIS — W19XXXA Unspecified fall, initial encounter: Secondary | ICD-10-CM

## 2021-12-12 NOTE — ED Triage Notes (Signed)
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  Denies any OTC meds. ?

## 2021-12-12 NOTE — Discharge Instructions (Signed)
Your x-ray was normal.  Please apply ice.  Follow-up if symptoms persist or worsen. ?

## 2021-12-12 NOTE — ED Provider Notes (Signed)
?[place] ? ? ? ?CSN: [id number] ?Arrival date & time: 12/12/21  1603 ? ? ?  ? ?History   ?Chief Complaint ?Chief Complaint  ?Patient presents with  ? Knee Pain  ? ? ?HPI ?Victoria Simmons is a 26 y.o. female.  ? ?Patient presents with left knee pain that started today after a fall.  Patient reports that she was trying to clean the stairmaster at work when she tried to step down and landed directly on her left knee.  Denies hitting head or losing consciousness.  Patient is having difficulty bearing weight on the knee.  Denies any numbness or tingling.  Has not taken any medications to help alleviate pain. ? ? ?Knee Pain ? ?Past Medical History:  ?Diagnosis Date  ? Anxiety   ? Bipolar 2 disorder ([place])   ? Depression   ? Dysmenorrhea   ? Hypertriglyceridemia 2020  ? MDD (major depressive disorder) 06/19/2017  ? Ovarian torsion 2008  ? ? ?Patient Active Problem List  ? Diagnosis Date Noted  ? Infected epidermoid cyst 11/03/2021  ? Reflux esophagitis 11/03/2021  ? Bloody diarrhea 04/06/2020  ? Generalized abdominal pain 04/06/2020  ? Well woman exam 06/19/2017  ? HLD (hyperlipidemia) 06/19/2017  ? Bipolar 2 disorder, major depressive episode ([place]) 05/13/2017  ? GAD (generalized anxiety disorder) 05/13/2017  ? Menorrhagia 01/25/2014  ? Family history of hypercholesterolemia 01/06/2013  ? Murmur 01/06/2013  ? ? ?Past Surgical History:  ?Procedure Laterality Date  ? COLONOSCOPY  08/2020  ? polyp, int hem, benign biopsies (Armbruster)  ? ESOPHAGOGASTRODUODENOSCOPY  08/2020  ? LA grade A reflux esophagitis and antral gastritis, H pylori neg, treated with PPI BID and carafate course (Armbruster  ? OVARY SURGERY  06/30/2007  ? dx laparoscopy, reduction of right adnexal torsion, bilateral oophoropexy of each ovary to posterior uterine fundus  ? ? ?OB History   ? ? Gravida  ?0  ? Para  ?0  ? Term  ?0  ? Preterm  ?0  ? AB  ?0  ? Living  ?0  ?  ? ? SAB  ?0  ? IAB  ?0  ? Ectopic  ?0  ? Multiple  ?0  ? Live Births  ?0  ?    ?  ?  ? ? ? ?Home Medications   ? ?Prior to Admission medications   ?Medication Sig Start Date End Date Taking? Authorizing Provider  ?ARIPiprazole (ABILIFY) 10 MG tablet Take 1 tablet (10 mg total) by mouth at bedtime. 12/04/21  Yes Orlene Erm, MD  ?doxycycline (VIBRA-TABS) 100 MG tablet Take 1 tablet (100 mg total) by mouth 2 (two) times daily. 11/03/21  Yes Ria Bush, MD  ?fluvoxaMINE (LUVOX) 100 MG tablet Take 2 tablets (200 mg total) by mouth at bedtime. 10/31/21  Yes Orlene Erm, MD  ?hydrOXYzine (ATARAX/VISTARIL) 25 MG tablet TAKE 1 TABLET (25 MG TOTAL) BY MOUTH 3 (THREE) TIMES DAILY AS NEEDED FOR ANXIETY. 12/30/20 12/30/21 Yes Orlene Erm, MD  ?lamoTRIgine (LAMICTAL) 100 MG tablet Take 1.5 tablets (150 mg total) by mouth at bedtime. 12/04/21  Yes Orlene Erm, MD  ?Levonorgestrel (KYLEENA IU) by Intrauterine route. 02/21/18 02/22/23 Yes [provider]  ?traZODone (DESYREL) 100 MG tablet Take 1.5 tablets (150 mg total) by mouth at bedtime. 12/04/21  Yes Orlene Erm, MD  ?pantoprazole (PROTONIX) 40 MG tablet TAKE 1 TABLET (40 MG TOTAL) BY MOUTH 2 (TWO) TIMES DAILY. 08/25/20 08/25/21  Armbruster, Carlota Raspberry, MD  ?dicyclomine (BENTYL)  10 MG capsule Take 1 capsule (10 mg total) by mouth 4 (four) times daily as needed for spasms. ?Patient not taking: Reported on 07/26/2020 07/08/20 07/26/20  Noralyn Pick, NP  ? ? ?Family History ?Family History  ?Problem Relation Age of Onset  ? Hepatitis B Mother   ? Anxiety disorder Mother   ? Hypertension Father   ? ADD / ADHD Brother   ? Ovarian cancer Paternal Grandmother   ? ? ?Social History ?Social History  ? ?Tobacco Use  ? Smoking status: Never  ? Smokeless tobacco: Never  ?Vaping Use  ? Vaping Use: Former  ?Substance Use Topics  ? Alcohol use: Yes  ?  Comment: last drink on Tuesday,: pt had a glass of wine, drinks twice a month (5-7 drinks)  ? Drug use: Yes  ?  Types: Marijuana  ?  Comment: last use 16 Aug 2020   ? ? ? ?Allergies   ?Patient has no known allergies. ? ? ?Review of Systems ?Review of Systems ?Per HPI ? ?Physical Exam ?Triage Vital Signs ?ED Triage Vitals [12/12/21 1631]  ?Enc Vitals Group  ?   BP 101/68  ?   Pulse Rate 74  ?   Resp 18  ?   Temp 98.7 ?F (37.1 ?C)  ?   Temp Source Oral  ?   SpO2 94 %  ?   Weight 195 lb (88.5 kg)  ?   Height '5\' 5"'$  (1.651 m)  ?   Head Circumference   ?   Peak Flow   ?   Pain Score 8  ?   Pain Loc   ?   Pain Edu?   ?   Excl. in [place]?   ? ?No data found. ? ?Updated Vital Signs ?BP 101/68 (BP Location: Left Arm)   Pulse 74   Temp 98.7 ?F (37.1 ?C) (Oral)   Resp 18   Ht '5\' 5"'$  (1.651 m)   Wt 195 lb (88.5 kg)   SpO2 94%   BMI 32.45 kg/m?  ? ?Visual Acuity ?Right Eye Distance:   ?Left Eye Distance:   ?Bilateral Distance:   ? ?Right Eye Near:   ?Left Eye Near:    ?Bilateral Near:    ? ?Physical Exam ?Constitutional:   ?   General: She is not in acute distress. ?   Appearance: Normal appearance. She is not toxic-appearing or diaphoretic.  ?HENT:  ?   Head: Normocephalic and atraumatic.  ?Eyes:  ?   Extraocular Movements: Extraocular movements intact.  ?   Conjunctiva/sclera: Conjunctivae normal.  ?Pulmonary:  ?   Effort: Pulmonary effort is normal.  ?Musculoskeletal:  ?   Right knee: Normal.  ?   Left knee: Swelling present. No crepitus. Decreased range of motion. Tenderness present. No LCL laxity, MCL laxity, ACL laxity or PCL laxity.Normal alignment, normal meniscus and normal patellar mobility. Normal pulse.  ?   Comments: Very mild superficial abrasion located to anterior patella with mild swelling.  Tenderness to palpation generalized throughout anterior knee.  Limited range of motion due to pain.  Neurovascular intact.  ?Neurological:  ?   General: No focal deficit present.  ?   Mental Status: She is alert and oriented to person, place, and time. Mental status is at baseline.  ?Psychiatric:     ?   Mood and Affect: Mood normal.     ?   Behavior: Behavior normal.     ?   Thought  Content: Thought content normal.     ?  Judgment: Judgment normal.  ? ? ? ?UC Treatments / Results  ?Labs ?(all labs ordered are listed, but only abnormal results are displayed) ?Labs Reviewed - No data to display ? ?EKG ? ? ?Radiology ?DG Knee Complete 4 Views Left ? ?Result Date: 12/12/2021 ?CLINICAL DATA:  [place] with trauma to the knee. EXAM: LEFT KNEE - COMPLETE 4+ VIEW COMPARISON:  None. FINDINGS: No evidence of fracture, dislocation, or joint effusion. No evidence of arthropathy or other focal bone abnormality. Soft tissues are unremarkable. IMPRESSION: Normal radiographs. Electronically Signed   By: Nelson Chimes M.D.   On: 12/12/2021 16:53   ? ?Procedures ?Procedures (including critical care time) ? ?Medications Ordered in UC ?Medications - No data to display ? ?Initial Impression / Assessment and Plan / UC Course  ?I have reviewed the triage vital signs and the nursing notes. ? ?Pertinent labs & imaging results that were available during my care of the patient were reviewed by me and considered in my medical decision making (see chart for details). ? ?  ? ?Left ankle x-ray was negative for any acute bony abnormality.  Suspect contusion and abrasion to left knee from impact.  Patient to use ice application and to monitor abrasion for signs of infection.  Knee brace applied for support and stability.  Discussed return precautions.  Patient verbalized understanding and was agreeable with plan. ?Final Clinical Impressions(s) / UC Diagnoses  ? ?Final diagnoses:  ?Acute pain of left knee  ?Fall, initial encounter  ? ? ? ?Discharge Instructions   ? ?  ?Your x-ray was normal.  Please apply ice.  Follow-up if symptoms persist or worsen. ? ? ? ? ?ED Prescriptions   ?None ?  ? ?PDMP not reviewed this encounter. ?  ?Teodora Medici, [place] ?12/12/21 1708 ? ?

## 2021-12-15 ENCOUNTER — Other Ambulatory Visit: Payer: Self-pay | Admitting: Child and Adolescent Psychiatry

## 2021-12-15 ENCOUNTER — Other Ambulatory Visit (HOSPITAL_COMMUNITY): Payer: Self-pay

## 2021-12-15 DIAGNOSIS — F411 Generalized anxiety disorder: Secondary | ICD-10-CM

## 2021-12-15 DIAGNOSIS — F3181 Bipolar II disorder: Secondary | ICD-10-CM

## 2021-12-15 MED ORDER — ARIPIPRAZOLE 10 MG PO TABS
10.0000 mg | ORAL_TABLET | Freq: Every day | ORAL | 0 refills | Status: DC
  Filled 2021-12-15: qty 7, 7d supply, fill #0

## 2021-12-15 MED ORDER — TRAZODONE HCL 100 MG PO TABS
150.0000 mg | ORAL_TABLET | Freq: Every day | ORAL | 0 refills | Status: DC
  Filled 2021-12-15: qty 10, 6d supply, fill #0

## 2021-12-15 MED ORDER — LAMOTRIGINE 100 MG PO TABS
150.0000 mg | ORAL_TABLET | Freq: Every day | ORAL | 0 refills | Status: DC
  Filled 2021-12-15: qty 10, 6d supply, fill #0

## 2021-12-15 MED ORDER — FLUVOXAMINE MALEATE 100 MG PO TABS
200.0000 mg | ORAL_TABLET | Freq: Every evening | ORAL | 0 refills | Status: DC
  Filled 2021-12-15: qty 60, 30d supply, fill #0

## 2021-12-15 NOTE — Telephone Encounter (Signed)
Please call her on Monday and schedule a follow up appointment for future refills on meds. Thanks

## 2021-12-18 ENCOUNTER — Other Ambulatory Visit (HOSPITAL_COMMUNITY): Payer: Self-pay

## 2021-12-19 ENCOUNTER — Other Ambulatory Visit (HOSPITAL_COMMUNITY): Payer: Self-pay

## 2021-12-19 ENCOUNTER — Other Ambulatory Visit: Payer: Self-pay

## 2021-12-19 ENCOUNTER — Telehealth (INDEPENDENT_AMBULATORY_CARE_PROVIDER_SITE_OTHER): Payer: No Typology Code available for payment source | Admitting: Child and Adolescent Psychiatry

## 2021-12-19 DIAGNOSIS — E559 Vitamin D deficiency, unspecified: Secondary | ICD-10-CM

## 2021-12-19 DIAGNOSIS — F411 Generalized anxiety disorder: Secondary | ICD-10-CM

## 2021-12-19 DIAGNOSIS — Z79899 Other long term (current) drug therapy: Secondary | ICD-10-CM | POA: Diagnosis not present

## 2021-12-19 DIAGNOSIS — F3181 Bipolar II disorder: Secondary | ICD-10-CM

## 2021-12-19 MED ORDER — TRAZODONE HCL 100 MG PO TABS
150.0000 mg | ORAL_TABLET | Freq: Every day | ORAL | 0 refills | Status: DC
  Filled 2021-12-19: qty 10, 6d supply, fill #0

## 2021-12-19 MED ORDER — LAMOTRIGINE 100 MG PO TABS
150.0000 mg | ORAL_TABLET | Freq: Every day | ORAL | 0 refills | Status: DC
  Filled 2021-12-19: qty 10, 6d supply, fill #0

## 2021-12-19 MED ORDER — FLUVOXAMINE MALEATE 100 MG PO TABS
200.0000 mg | ORAL_TABLET | Freq: Every evening | ORAL | 0 refills | Status: DC
Start: 2021-12-19 — End: 2022-02-06
  Filled 2021-12-19 – 2022-01-15 (×2): qty 60, 30d supply, fill #0

## 2021-12-19 MED ORDER — LAMOTRIGINE 100 MG PO TABS
150.0000 mg | ORAL_TABLET | Freq: Every day | ORAL | 1 refills | Status: DC
  Filled 2021-12-19 – 2021-12-25 (×2): qty 45, 30d supply, fill #0
  Filled 2022-01-29: qty 45, 30d supply, fill #1

## 2021-12-19 MED ORDER — ARIPIPRAZOLE 10 MG PO TABS
10.0000 mg | ORAL_TABLET | Freq: Every day | ORAL | 1 refills | Status: DC
  Filled 2021-12-19 – 2021-12-25 (×2): qty 30, 30d supply, fill #0
  Filled 2022-01-29: qty 30, 30d supply, fill #1

## 2021-12-19 MED ORDER — ARIPIPRAZOLE 10 MG PO TABS
10.0000 mg | ORAL_TABLET | Freq: Every day | ORAL | 0 refills | Status: DC
  Filled 2021-12-19: qty 7, 7d supply, fill #0

## 2021-12-19 MED ORDER — TRAZODONE HCL 100 MG PO TABS
150.0000 mg | ORAL_TABLET | Freq: Every day | ORAL | 1 refills | Status: DC
  Filled 2021-12-19 – 2021-12-25 (×2): qty 45, 30d supply, fill #0

## 2021-12-19 NOTE — Progress Notes (Addendum)
Virtual Visit via Video Note ? ?I connected with Victoria Simmons on 12/19/21 at 10:30 AM EDT by a video enabled telemedicine application and verified that I am speaking with the correct person using two identifiers. ? ?Location: ?Patient: home ?Provider: office ?  ?I discussed the limitations of evaluation and management by telemedicine and the availability of in person appointments. The patient expressed understanding and agreed to proceed. ? ?  ?I discussed the assessment and treatment plan with the patient. The patient was provided an opportunity to ask questions and all were answered. The patient agreed with the plan and demonstrated an understanding of the instructions. ?  ?The patient was advised to call back or seek an in-person evaluation if the symptoms worsen or if the condition fails to improve as anticipated. ? ?I provided 30 minutes of non-face-to-face time during this encounter. ? ? ?Orlene Erm, MD ? ? ? ? ? ?BH MD/PA/NP OP Progress Note ? ?12/19/2021 11:02 AM ?Victoria Simmons  ?MRN:  [id number] ? ?Chief Complaint: "I am doing good"(pt) ? ?Synopsis: Victoria Simmons is a 26 year old Caucasian female, employed at daycare, with history of bipolar disorder type II, GAD, insomnia was seen by Dr. Einar Grad since 2017 and transition to this writer in April 2020.  She is currently prescribed Lamictal 150 mg once a day, Abilify 10 mg once a day, trazodone 150 mg once a day and Luvox 150 mg once a day.  ? ?HPI:  ? ?Victoria Simmons was seen and evaluated over telemedicine encounter for medication management follow-up.  She was last seen about 6 months ago and since then she had missed her appointment and failed to schedule a follow-up appointment.  She was recently called to make a follow-up appointment in order to continue to receive refills and she therefore followed up today. ? ?In the interim since the last appointment she had continued to see her therapist about once a month and missed her last appointment.  She also was able to  continue taking her medications in the interim since last appointment. ? ?Her therapist about 2 months ago reached out to this Probation officer and informed that Victoria Simmons had cut her self but not in the context of suicide attempt and she was recommended to make an appointment with this Probation officer then.  ? ?Today she reports that she has been doing "very good".  She reports that around the end of January she was not doing well because her wife was asked to leave her parent's home.  She reports that her wife was not truthful about her work and her finances which they found out and therefore her parents decided to have her leave the house.  She reports that initially for the first few days she was not doing well, was more depressed however she understood her parents reasoning of kicking her wife out of their home.  She reports that they continue to stay in contact, her wife lives at her parents home now and they are planning to have enough financial stability to move in together. ? ?Victoria Simmons reports that lately her mood has been "very good", denies any low lows or depressed mood, has been working out 3 days a week and enjoys working out, found a new job and will be starting work next week at [employer] which is a [employer] in [place].  She reports that she is somewhat anxious about starting the work but also excited.  She rates her anxiety around 5 out of 10, 10 being most  anxious and reports anxiety is manageable. ? ?She reports that her sleep is disrupted which appears to be most likely in the context of not being in routine.  She reports that she has been eating well.  She denies any suicidal thoughts or self-harm thoughts/behaviors.  She reports that around Christmas time she felt depressed for about 2 weeks because there were deaths in the family and holidays were stressful and therefore she had cut herself but not in the context of suicide attempt.  She reports that she has not had any thoughts of self-harm or suicide  since then. ? ?She denies any substance abuse except using nicotine vape occasionally and drinking alcohol about 2 or 3 drinks on rare occasions. ? ?We discussed to continue with current medications and follow back again in 6 weeks or earlier if needed.  She verbalized understanding and agreed with the plan. ? ? ?Visit Diagnosis:  ?  ICD-10-CM   ?1. Bipolar 2 disorder, major depressive episode (HCC)  F31.81 ARIPiprazole (ABILIFY) 10 MG tablet  ?  lamoTRIgine (LAMICTAL) 100 MG tablet  ?  ?2. GAD (generalized anxiety disorder)  F41.1 fluvoxaMINE (LUVOX) 100 MG tablet  ?  ?3. Other long term (current) drug therapy  Z79.899 Hemoglobin A1c  ?  Lipid panel  ?  Vitamin B12  ?  ?4. Vitamin D deficiency  E55.9 VITAMIN D 25 Hydroxy (Vit-D Deficiency, Fractures)  ?  ? ? ?Past Psychiatric History: PPHx reviewed today and as followin. 2 previous inpatient admission at [facility] at the age of 57, and last at [facility], no previous suicide attempts, has history of cutting and her past medication trials include Prozac and Zoloft.  Decreasing Luvox to 50 mg worsened her symptoms of anxiety therefore it was increased back to 100 mg once a day.   Previous trials of Zoloft 150 mg and Prozac 10 mg stopped due to inefficacy.  ? ?Past Medical History:  ?Past Medical History:  ?Diagnosis Date  ? Anxiety   ? Bipolar 2 disorder ([place])   ? Depression   ? Dysmenorrhea   ? Hypertriglyceridemia 2020  ? MDD (major depressive disorder) 06/19/2017  ? Ovarian torsion 2008  ?  ?Past Surgical History:  ?Procedure Laterality Date  ? COLONOSCOPY  08/2020  ? polyp, int hem, benign biopsies (Armbruster)  ? ESOPHAGOGASTRODUODENOSCOPY  08/2020  ? LA grade A reflux esophagitis and antral gastritis, H pylori neg, treated with PPI BID and carafate course (Armbruster  ? OVARY SURGERY  06/30/2007  ? dx laparoscopy, reduction of right adnexal torsion, bilateral oophoropexy of each ovary to posterior uterine fundus  ? ? ?Family Psychiatric History: As mentioned in initial H&P,  reviewed today, no change Family History:  ?Family History  ?Problem Relation Age of Onset  ? Hepatitis B Mother   ? Anxiety disorder Mother   ? Hypertension Father   ? ADD / ADHD Brother   ? Ovarian cancer Paternal Grandmother   ? ? ?Social History:  ?Social History  ? ?Socioeconomic History  ? Marital status: Married  ?  Spouse name: Not on file  ? Number of children: 0  ? Years of education: Not on file  ? Highest education level: Some college, no degree  ?Occupational History  ? Occupation: [employer]  ?  Comment: full time  ?Tobacco Use  ? Smoking status: Never  ? Smokeless tobacco: Never  ?Vaping Use  ? Vaping Use: Former  ?Substance and Sexual Activity  ? Alcohol use: Yes  ?  Comment:  last drink on Tuesday,: pt had a glass of wine, drinks twice a month (5-7 drinks)  ? Drug use: Yes  ?  Types: Marijuana  ?  Comment: last use 16 Aug 2020  ? Sexual activity: Yes  ?Other Topics Concern  ? Not on file  ?Social History Narrative  ?   ?    ? ?Social Determinants of Health  ? ?Financial Resource Strain: Not on file  ?Food Insecurity: Not on file  ?Transportation Needs: Not on file  ?Physical Activity: Not on file  ?Stress: Not on file  ?Social Connections: Not on file  ? ? ?Allergies: No Known Allergies ? ?Metabolic Disorder Labs: ?Lab Results  ?Component Value Date  ? HGBA1C 5.0 07/26/2020  ? MPG 97 07/26/2020  ? MPG 94 05/14/2017  ? ?No results found for: PROLACTIN ?Lab Results  ?Component Value Date  ? CHOL 226 (H) 07/26/2020  ? TRIG 138 07/26/2020  ? HDL 44 07/26/2020  ? CHOLHDL 5.1 07/26/2020  ? VLDL 28 07/26/2020  ? LDLCALC 154 (H) 07/26/2020  ? [place] 103 (H) 04/05/2020  ? ?Lab Results  ?Component Value Date  ? TSH 2.70 09/12/2021  ? TSH 2.401 07/26/2020  ? ? ?Therapeutic Level Labs: ?No results found for: LITHIUM ?No results found for: VALPROATE ?No components found for:  CBMZ ? ?Current Medications: ?Current Outpatient Medications  ?Medication Sig Dispense Refill  ? ARIPiprazole (ABILIFY) 10 MG  tablet Take 1 tablet (10 mg total) by mouth at bedtime. 7 tablet 0  ? doxycycline (VIBRA-TABS) 100 MG tablet Take 1 tablet (100 mg total) by mouth 2 (two) times daily. 10 tablet 0  ? fluvoxaMINE (LUVOX) 1

## 2021-12-20 ENCOUNTER — Telehealth: Payer: Self-pay

## 2021-12-20 NOTE — Telephone Encounter (Signed)
mailed lab-work orders to patient ?

## 2021-12-25 ENCOUNTER — Other Ambulatory Visit (HOSPITAL_COMMUNITY): Payer: Self-pay

## 2021-12-25 ENCOUNTER — Ambulatory Visit: Payer: No Typology Code available for payment source | Admitting: Family Medicine

## 2021-12-27 ENCOUNTER — Telehealth: Payer: No Typology Code available for payment source | Admitting: Family

## 2021-12-27 ENCOUNTER — Other Ambulatory Visit (HOSPITAL_COMMUNITY): Payer: Self-pay

## 2021-12-27 DIAGNOSIS — J029 Acute pharyngitis, unspecified: Secondary | ICD-10-CM | POA: Diagnosis not present

## 2021-12-27 DIAGNOSIS — Z20818 Contact with and (suspected) exposure to other bacterial communicable diseases: Secondary | ICD-10-CM | POA: Diagnosis not present

## 2021-12-27 MED ORDER — AMOXICILLIN 500 MG PO CAPS: 500.0000 mg | ORAL_CAPSULE | Freq: Two times a day (BID) | ORAL | 0 refills | Status: AC

## 2021-12-27 MED ORDER — AMOXICILLIN 500 MG PO CAPS
500.0000 mg | ORAL_CAPSULE | Freq: Two times a day (BID) | ORAL | 0 refills | Status: DC
Start: 2021-12-27 — End: 2021-12-27
  Filled 2021-12-27: qty 20, 10d supply, fill #0

## 2021-12-27 NOTE — Progress Notes (Signed)
?Virtual Visit Consent  ? ?Victoria Simmons, you are scheduled for a virtual visit with a [facility] provider today.   ?  ?Just as with appointments in the office, your consent must be obtained to participate.  Your consent will be active for this visit and any virtual visit you may have with one of our providers in the next 365 days.   ?  ?If you have a MyChart account, a copy of this consent can be sent to you electronically.  All virtual visits are billed to your insurance company just like a traditional visit in the office.   ? ?As this is a virtual visit, video technology does not allow for your provider to perform a traditional examination.  This may limit your provider's ability to fully assess your condition.  If your provider identifies any concerns that need to be evaluated in person or the need to arrange testing (such as labs, EKG, etc.), we will make arrangements to do so.   ?  ?Although advances in technology are sophisticated, we cannot ensure that it will always work on either your end or our end.  If the connection with a video visit is poor, the visit may have to be switched to a telephone visit.  With either a video or telephone visit, we are not always able to ensure that we have a secure connection.    ? ?I need to obtain your verbal consent now.   Are you willing to proceed with your visit today?  ?  ?Victoria Simmons has provided verbal consent on 12/27/2021 for a virtual visit (video or telephone). ?  ?Victoria Dun, FNP  ? ?Date: 12/27/2021 9:33 AM ? ? ?Virtual Visit via Video Note  ? Victoria Simmons, connected with  Victoria Simmons  ([id number], 1996/03/31) on 12/27/21 at  9:30 AM EDT by a video-enabled telemedicine application and verified that I am speaking with the correct person using two identifiers. ? ?Location: ?Patient: Virtual Visit Location Patient: Home ?Provider: Virtual Visit Location Provider: Home Office ?  ?I discussed the limitations of evaluation and management by telemedicine  and the availability of in person appointments. The patient expressed understanding and agreed to proceed.   ? ?History of Present Illness: ?Victoria Simmons is a 26 y.o. who identifies as a female who was assigned female at birth, and is being seen today for sore throat that started yesterday. She reports her throat pain has worsen and is now swollen and has white patches bilaterally.  ? ?HPI: Sore Throat  ?This is a new problem. The current episode started yesterday. The problem has been gradually worsening. There has been no fever. The pain is at a severity of 10/10. The pain is mild. Associated symptoms include congestion, swollen glands and trouble swallowing. Pertinent negatives include no coughing, ear pain or headaches. She has had exposure to strep. She has tried acetaminophen and NSAIDs for the symptoms. The treatment provided mild relief.   ?Problems:  ?Patient Active Problem List  ? Diagnosis Date Noted  ? Infected epidermoid cyst 11/03/2021  ? Reflux esophagitis 11/03/2021  ? Bloody diarrhea 04/06/2020  ? Generalized abdominal pain 04/06/2020  ? Well woman exam 06/19/2017  ? HLD (hyperlipidemia) 06/19/2017  ? Bipolar 2 disorder, major depressive episode ([place]) 05/13/2017  ? GAD (generalized anxiety disorder) 05/13/2017  ? Menorrhagia 01/25/2014  ? Family history of hypercholesterolemia 01/06/2013  ? Murmur 01/06/2013  ?  ?Allergies: No Known Allergies ?Medications:  ?Current Outpatient Medications:  ?  amoxicillin (AMOXIL) 500 MG capsule, Take 1 capsule (500 mg total) by mouth 2 (two) times daily for 10 days., Disp: 20 capsule, Rfl: 0 ?  ARIPiprazole (ABILIFY) 10 MG tablet, Take 1 tablet (10 mg total) by mouth at bedtime., Disp: 30 tablet, Rfl: 1 ?  fluvoxaMINE (LUVOX) 100 MG tablet, Take 2 tablets (200 mg total) by mouth at bedtime., Disp: 60 tablet, Rfl: 0 ?  hydrOXYzine (ATARAX/VISTARIL) 25 MG tablet, TAKE 1 TABLET (25 MG TOTAL) BY MOUTH 3 (THREE) TIMES DAILY AS NEEDED FOR ANXIETY., Disp: 75 tablet,  Rfl: 1 ?  lamoTRIgine (LAMICTAL) 100 MG tablet, Take 1.5 tablets (150 mg total) by mouth at bedtime., Disp: 45 tablet, Rfl: 1 ?  Levonorgestrel (KYLEENA IU), by Intrauterine route., Disp: , Rfl:  ?  pantoprazole (PROTONIX) 40 MG tablet, TAKE 1 TABLET (40 MG TOTAL) BY MOUTH 2 (TWO) TIMES DAILY., Disp: 60 tablet, Rfl: 3 ?  traZODone (DESYREL) 100 MG tablet, Take 1.5 tablets (150 mg total) by mouth at bedtime., Disp: 45 tablet, Rfl: 1 ? ?Observations/Objective: ?Patient is well-developed, well-nourished in no acute distress.  ?Resting comfortably  at home.  ?Head is normocephalic, atraumatic.  ?No labored breathing.  ?Speech is clear and coherent with logical content.  ?Patient is alert and oriented at baseline.  ?Tonsils erythemas and swollen ? ?Assessment and Plan: ?1. Acute pharyngitis, unspecified etiology ?- amoxicillin (AMOXIL) 500 MG capsule; Take 1 capsule (500 mg total) by mouth 2 (two) times daily for 10 days.  Dispense: 20 capsule; Refill: 0 ? ?2. Exposure to strep throat ?- amoxicillin (AMOXIL) 500 MG capsule; Take 1 capsule (500 mg total) by mouth 2 (two) times daily for 10 days.  Dispense: 20 capsule; Refill: 0 ? ?- Take meds as prescribed ?- Use a cool mist humidifier  ?-Use saline nose sprays frequently ?-Force fluids ?-For any cough or congestion ? Use plain Mucinex- regular strength or max strength is fine ?-For fever or aces or pains- take tylenol or ibuprofen. ?-Throat lozenges if help ?-New toothbrush in 3 days ? ? ?Follow Up Instructions: ?I discussed the assessment and treatment plan with the patient. The patient was provided an opportunity to ask questions and all were answered. The patient agreed with the plan and demonstrated an understanding of the instructions.  A copy of instructions were sent to the patient via MyChart unless otherwise noted below.  ? ? ? ?The patient was advised to call back or seek an in-person evaluation if the symptoms worsen or if the condition fails to improve as  anticipated. ? ?Time:  ?I spent 9 minutes with the patient via telehealth technology discussing the above problems/concerns.   ? ?Victoria Dun, FNP ? ? ?

## 2021-12-27 NOTE — Addendum Note (Signed)
Addended by: Evelina Dun A on: 12/27/2021 09:44 AM ? ? Modules accepted: Orders ? ?

## 2022-01-15 ENCOUNTER — Other Ambulatory Visit (HOSPITAL_COMMUNITY): Payer: Self-pay

## 2022-01-29 ENCOUNTER — Other Ambulatory Visit (HOSPITAL_COMMUNITY): Payer: Self-pay

## 2022-01-29 ENCOUNTER — Ambulatory Visit (INDEPENDENT_AMBULATORY_CARE_PROVIDER_SITE_OTHER): Payer: No Typology Code available for payment source | Admitting: Licensed Clinical Social Worker

## 2022-01-29 DIAGNOSIS — F3181 Bipolar II disorder: Secondary | ICD-10-CM | POA: Diagnosis not present

## 2022-01-29 NOTE — Plan of Care (Signed)
?  Problem: Alleviate depressive/manic symptoms and return to improved levels of effective functioning. ?Goal: LTG: Stabilize mood and increase goal-directed behavior: per pt self report ?Outcome: Progressing ?Goal: STG: Victoria Simmons WILL ATTEND AT LEAST 80% OF SCHEDULED FOLLOW-UP OPT APPOINTMENTS ?Outcome: Progressing ?  ?

## 2022-01-29 NOTE — Progress Notes (Signed)
Virtual Visit via Video Note ? ?I connected with Victoria Simmons on 01/29/22 at 11:00 AM EDT by a video enabled telemedicine application and verified that I am speaking with the correct person using two identifiers. ? ?*Pt elected to end session early due to being at work and work being busy. ? ?Location: ?Patient: home ?Provider: remote office [place], [place]) ?  ?I discussed the limitations of evaluation and management by telemedicine and the availability of in person appointments. The patient expressed understanding and agreed to proceed. ?  ?I discussed the assessment and treatment plan with the patient. The patient was provided an opportunity to ask questions and all were answered. The patient agreed with the plan and demonstrated an understanding of the instructions. ?  ?The patient was advised to call back or seek an in-person evaluation if the symptoms worsen or if the condition fails to improve as anticipated. ? ?I provided 10 minutes of non-face-to-face time during this encounter. ? ? ?Delance Weide R Nitin Mckowen, LCSW ? ?THERAPIST PROGRESS NOTE ? ?Session Time: 3-329J ? ?Participation Level: Active ? ?Behavioral Response: Neat and Well GroomedAlertAnxious ? ?Type of Therapy: Individual Therapy ? ?Treatment Goals addressed:  ?Problem: Alleviate depressive/manic symptoms and return to improved levels of effective functioning. ? ?Goal: LTG: Stabilize mood and increase goal-directed behavior: per pt self report ?Outcome: Not Progressing ?Note: Fluctuating mood due to recent job loss ? ?Goal: STG: [name] WILL ATTEND AT LEAST 80% OF SCHEDULED FOLLOW-UP OPT APPOINTMENTS ?Outcome: Progressing ?Note: Pt was 15 min late for appt--did not link in on video visit ? ?Interventions:  ? ? Intervention: Assess emotional status and coping mechanisms ? ?Intervention: Discuss self-management skills ? ?Intervention: ENCOURAGE [name] TO FOLLOW PERSONAL SAFETY PLAN WHEN IN CRISIS ?  ? ?Summary: Victoria Simmons is a 26 y.o. female who  presents with symptoms consistent with bipolar II diagnosis. Patient reports a decrease in depression and anxiety symptoms since last session. Patient reports that relationship with her wife is better--patient reports that currently they are both living separately but are still together. ? ?Allowed pt to explore and express thoughts and feelings associated with recent life situations and external stressors. Patient reports that she has started a new job, working at a [employer]. Patient reports that she has learned a lot about THC, CBD, in ways that these can help others. Patient feels very confident in her work, and feels like she is helping others. Patient reporting that her mood has improved, her relationships with others have improved, and her financial situation has also improved because she's now currently working full time period patient reports that her wife is also working now, so their financial situation has significantly improved since last session. Patient reports that her relationship with her parents is good at this time. ? ?Patient opted to cut session short because she is currently working. ? ?Continued recommendations are as follows: self care behaviors, positive social engagements, focusing on overall work/home/life balance, and focusing on positive physical and emotional wellness.  ? ?Suicidal/Homicidal: No ? ?Therapist Response: Pt is continuing to apply interventions learned in session into daily life situations. Pt is currently on track to meet goals utilizing interventions mentioned above. Personal growth and progress noted. Treatment to continue as indicated.  ? ?Plan: Return again in 4 weeks. ? ?Diagnosis:  ?Encounter Diagnosis  ?Name Primary?  ? Bipolar 2 disorder, major depressive episode ([place]) Yes  ? ? ?Collaboration of Care: Other pt encouraged to continue care with psychiatrist of record, Dr. Leotis Shames. ? ?  Patient/Guardian was advised Release of Information must be obtained prior to  any record release in order to collaborate their care with an outside provider. Patient/Guardian was advised if they have not already done so to contact the registration department to sign all necessary forms in order for [name] to release information regarding their care.  ? ?Consent: Patient/Guardian gives verbal consent for treatment and assignment of benefits for services provided during this visit. Patient/Guardian expressed understanding and agreed to proceed.  ? ? ? ? ?[name] R Sri Clegg, LCSW ?01/29/2022 ? ?

## 2022-02-06 ENCOUNTER — Telehealth (INDEPENDENT_AMBULATORY_CARE_PROVIDER_SITE_OTHER): Payer: No Typology Code available for payment source | Admitting: Child and Adolescent Psychiatry

## 2022-02-06 ENCOUNTER — Other Ambulatory Visit (HOSPITAL_COMMUNITY): Payer: Self-pay

## 2022-02-06 DIAGNOSIS — F3181 Bipolar II disorder: Secondary | ICD-10-CM

## 2022-02-06 DIAGNOSIS — F411 Generalized anxiety disorder: Secondary | ICD-10-CM | POA: Diagnosis not present

## 2022-02-06 MED ORDER — FLUVOXAMINE MALEATE 100 MG PO TABS
200.0000 mg | ORAL_TABLET | Freq: Every evening | ORAL | 1 refills | Status: DC
Start: 2022-02-06 — End: 2022-04-11
  Filled 2022-02-06 – 2022-02-20 (×2): qty 60, 30d supply, fill #0
  Filled 2022-04-02: qty 60, 30d supply, fill #1

## 2022-02-06 MED ORDER — LAMOTRIGINE 100 MG PO TABS
150.0000 mg | ORAL_TABLET | Freq: Every day | ORAL | 1 refills | Status: DC
Start: 2022-02-06 — End: 2022-04-11
  Filled 2022-02-06 – 2022-02-27 (×2): qty 45, 30d supply, fill #0
  Filled 2022-04-02: qty 45, 30d supply, fill #1

## 2022-02-06 MED ORDER — TRAZODONE HCL 100 MG PO TABS
150.0000 mg | ORAL_TABLET | Freq: Every day | ORAL | 1 refills | Status: DC
Start: 2022-02-06 — End: 2022-04-11
  Filled 2022-02-06 – 2022-02-27 (×2): qty 45, 30d supply, fill #0
  Filled 2022-04-02: qty 45, 30d supply, fill #1

## 2022-02-06 MED ORDER — ARIPIPRAZOLE 10 MG PO TABS
10.0000 mg | ORAL_TABLET | Freq: Every day | ORAL | 1 refills | Status: DC
  Filled 2022-02-06 – 2022-02-27 (×2): qty 30, 30d supply, fill #0
  Filled 2022-04-02: qty 30, 30d supply, fill #1

## 2022-02-06 NOTE — Progress Notes (Signed)
Virtual Visit via Video Note ? ?I connected with Victoria Simmons on 02/06/22 at 10:00 AM EDT by a video enabled telemedicine application and verified that I am speaking with the correct person using two identifiers. ? ?Location: ?Patient: at work ?Provider: office ?  ?I discussed the limitations of evaluation and management by telemedicine and the availability of in person appointments. The patient expressed understanding and agreed to proceed. ? ?  ?I discussed the assessment and treatment plan with the patient. The patient was provided an opportunity to ask questions and all were answered. The patient agreed with the plan and demonstrated an understanding of the instructions. ?  ?The patient was advised to call back or seek an in-person evaluation if the symptoms worsen or if the condition fails to improve as anticipated. ? ?I provided 15 minutes of non-face-to-face time during this encounter. ? ? ?Victoria Erm, MD ? ? ? ? ? ?[place] MD/PA/NP OP Progress Note ? ?02/06/2022 10:28 AM ?Victoria Simmons  ?MRN:  [id number] ? ?Chief Complaint: "I am doing good..."(pt) ? 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 Einar Grad since 2017 and transition to this writer in April 2020.  She is currently prescribed Lamictal 150 mg once a day, Abilify 10 mg once a day, trazodone 150 mg once a day and Luvox 150 mg once a day.  ? ?HPI:  ? ?Victoria Simmons was seen and evaluated over telemedicine encounter for medication management follow-up.  She was presented to her work and was evaluated alone.  Appointment was attended by rotating medical students and pt and parent provided informed consent to allow med student attend the appointment.  ? ?She reports that she is doing "good", denies any new concerns for today's appointment.  She reports that her mood is "happy", denies any low lows.  She reports that she is working at the  [employer] and [employer] store now since about last 6 weeks and enjoys her work, works full-time, saving money so that she can have her own place for living but currently staying with her parents.  She still sees her wife whenever she is free and wife is also working at [employer] now.  She denies problems with appetite, however tries to not stress it.  She reports that she sleeps well on most days but about a couple of times a month she has difficulty going to sleep and sleeps about 6 to 7 hours however on the other days she has been sleeping around 8 to 10 hours.  She denies any SI or HI, denies any nonsuicidal self-harm behaviors or thoughts.  She reports that she has been compliant with her medications and denies any problems with them.  She also has been seeing her therapist about once every month to every 6 weeks.  We discussed to continue with current medications and follow back again in 2 months or earlier if needed. ? ? ?Visit Diagnosis:  ?  ICD-10-CM   ?1. Bipolar 2 disorder, major depressive episode (HCC)  F31.81 ARIPiprazole (ABILIFY) 10 MG tablet  ?  lamoTRIgine (LAMICTAL) 100 MG tablet  ?  ?2. GAD (generalized anxiety disorder)  F41.1 fluvoxaMINE (LUVOX) 100 MG tablet  ?  ? ? ? ?Past Psychiatric History: PPHx reviewed today and as followin. 2 previous inpatient admission at [facility] at the age of 6, and last at [facility], no previous suicide attempts, has history  of cutting and her past medication trials include Prozac and Zoloft.  Decreasing Luvox to 50 mg worsened her symptoms of anxiety therefore it was increased back to 100 mg once a day.   Previous trials of Zoloft 150 mg and Prozac 10 mg stopped due to inefficacy.  ? ?Past Medical History:  ?Past Medical History:  ?Diagnosis Date  ? Anxiety   ? Bipolar 2 disorder ([place])   ? Depression   ? Dysmenorrhea   ? Hypertriglyceridemia 2020  ? MDD (major depressive disorder) 06/19/2017  ? Ovarian torsion 2008  ?  ?Past Surgical History:  ?Procedure Laterality Date  ? COLONOSCOPY   08/2020  ? polyp, int hem, benign biopsies ([facility])  ? ESOPHAGOGASTRODUODENOSCOPY  08/2020  ? LA grade A reflux esophagitis and antral gastritis, H pylori neg, treated with PPI BID and carafate course ([facility]  ? OVARY SURGERY  06/30/2007  ? dx laparoscopy, reduction of right adnexal torsion, bilateral oophoropexy of each ovary to posterior uterine fundus  ? ? ?Family Psychiatric History: As mentioned in initial H&P, reviewed today, no change Family History:  ?Family History  ?Problem Relation Age of Onset  ? Hepatitis B Mother   ? Anxiety disorder Mother   ? Hypertension Father   ? ADD / ADHD Brother   ? Ovarian cancer Paternal Grandmother   ? ? ?Social History:  ?Social History  ? ?Socioeconomic History  ? Marital status: Married  ?  Spouse name: Not on file  ? Number of children: 0  ? Years of education: Not on file  ? Highest education level: Some college, no degree  ?Occupational History  ? Occupation: [employer]  ?  Comment: full time  ?Tobacco Use  ? Smoking status: Never  ? Smokeless tobacco: Never  ?Vaping Use  ? Vaping Use: Former  ?Substance and Sexual Activity  ? Alcohol use: Yes  ?  Comment: last drink on Tuesday,: pt had a glass of wine, drinks twice a month (5-7 drinks)  ? Drug use: Yes  ?  Types: Marijuana  ?  Comment: last use 16 Aug 2020  ? Sexual activity: Yes  ?Other Topics Concern  ? Not on file  ?Social History Narrative  ?   ?    ? ?Social Determinants of Health  ? ?Financial Resource Strain: Not on file  ?Food Insecurity: Not on file  ?Transportation Needs: Not on file  ?Physical Activity: Not on file  ?Stress: Not on file  ?Social Connections: Not on file  ? ? ?Allergies: No Known Allergies ? ?Metabolic Disorder Labs: ?Lab Results  ?Component Value Date  ? HGBA1C 5.0 07/26/2020  ? MPG 97 07/26/2020  ? MPG 94 05/14/2017  ? ?No results found for: PROLACTIN ?Lab Results  ?Component Value Date  ? CHOL 226 (H) 07/26/2020  ? TRIG 138 07/26/2020  ? HDL 44 07/26/2020  ? CHOLHDL 5.1  07/26/2020  ? VLDL 28 07/26/2020  ? LDLCALC 154 (H) 07/26/2020  ? [place] 103 (H) 04/05/2020  ? ?Lab Results  ?Component Value Date  ? TSH 2.70 09/12/2021  ? TSH 2.401 07/26/2020  ? ? ?Therapeutic Level Labs: ?No results found for: LITHIUM ?No results found for: VALPROATE ?No components found for:  CBMZ ? ?Current Medications: ?Current Outpatient Medications  ?Medication Sig Dispense Refill  ? ARIPiprazole (ABILIFY) 10 MG tablet Take 1 tablet (10 mg total) by mouth at bedtime. 30 tablet 1  ? fluvoxaMINE (LUVOX) 100 MG tablet Take 2 tablets (200 mg total) by mouth at bedtime.  60 tablet 1  ? lamoTRIgine (LAMICTAL) 100 MG tablet Take 1.5 tablets (150 mg total) by mouth at bedtime. 45 tablet 1  ? Levonorgestrel (KYLEENA IU) by Intrauterine route.    ? traZODone (DESYREL) 100 MG tablet Take 1.5 tablets (150 mg total) by mouth at bedtime. 45 tablet 1  ? ?No current facility-administered medications for this visit.  ? ? ? ?Musculoskeletal: ?Strength & Muscle Tone: unable to assess since visit was over the telemedicine.  ?Gait & Station: unable to assess since visit was over the telemedicine.  ?Patient leans: N/A ? ?Psychiatric Specialty Exam: ?ROSReview of 12 systems negative except as mentioned in HPI ?  ?There were no vitals taken for this visit.There is no height or weight on file to calculate BMI.  ?General Appearance: Casual and with tattoos on multiple sites on her arm and lower end of throat.   ?Eye Contact:  Good  ?Speech:  Clear and Coherent and Normal Rate  ?Volume:  Normal  ?Mood: "good..."  ?Affect:  Appropriate, Congruent, and Full Range  ?Thought Process:  Goal Directed and Linear  ?Orientation:  Full (Time, Place, and Person)  ?Thought Content: Logical   ?Suicidal Thoughts:  No  ?Homicidal Thoughts:  No  ?Memory:  Immediate;   Fair ?Recent;   Fair ?Remote;   Fair  ?Judgement:  Good  ?Insight:  Good  ?Psychomotor Activity:  Normal  ?Concentration:  Concentration: Good and Attention Span: Good  ?Recall:  Good   ?Fund of Knowledge: Good  ?Language: Good  ?Akathisia:  No  ?  ?AIMS (if indicated): not done  ?Assets:  Communication Skills ?Desire for Improvement ?Financial Resources/Insurance ?Housing ?Leisure Time

## 2022-02-08 ENCOUNTER — Other Ambulatory Visit (HOSPITAL_COMMUNITY): Payer: Self-pay

## 2022-02-12 ENCOUNTER — Ambulatory Visit
Admission: EM | Admit: 2022-02-12 | Discharge: 2022-02-12 | Disposition: A | Discharge status: Home / Self Care | Payer: No Typology Code available for payment source | Attending: Physician Assistant | Admitting: Physician Assistant

## 2022-02-12 DIAGNOSIS — H6691 Otitis media, unspecified, right ear: Secondary | ICD-10-CM | POA: Diagnosis not present

## 2022-02-12 MED ORDER — AMOXICILLIN 500 MG PO CAPS: 500.0000 mg | ORAL_CAPSULE | Freq: Three times a day (TID) | ORAL | 0 refills | Status: DC

## 2022-02-12 NOTE — Discharge Instructions (Signed)
Return if any problems.

## 2022-02-12 NOTE — ED Triage Notes (Signed)
Patient presents to Urgent Care with complaints of otalgia and ha  since 3 days ago. Patient reports swelling and pain in bilateral ears.  ? ?

## 2022-02-16 ENCOUNTER — Other Ambulatory Visit (HOSPITAL_COMMUNITY): Payer: Self-pay

## 2022-02-20 ENCOUNTER — Other Ambulatory Visit (HOSPITAL_COMMUNITY): Payer: Self-pay

## 2022-02-21 NOTE — ED Provider Notes (Signed)
?[facility] ? ? ? ?CSN: [id number] ?Arrival date & time: 02/12/22  0947 ? ? ?  ? ?History   ?Chief Complaint ?Chief Complaint  ?Patient presents with  ? Otalgia  ? ? ?HPI ?Victoria Simmons is a 26 y.o. female.  ? ?The history is provided by the patient. No language interpreter was used.  ?Otalgia ?Location:  Right ?Behind ear:  No abnormality ?Quality:  Aching ?Severity:  Moderate ?Onset quality:  Gradual ?Progression:  Worsening ?Chronicity:  New ?Worsened by:  Nothing ?Ineffective treatments:  None tried ? ?Past Medical History:  ?Diagnosis Date  ? Anxiety   ? Bipolar 2 disorder ([place])   ? Depression   ? Dysmenorrhea   ? Hypertriglyceridemia 2020  ? MDD (major depressive disorder) 06/19/2017  ? Ovarian torsion 2008  ? ? ?Patient Active Problem List  ? Diagnosis Date Noted  ? Infected epidermoid cyst 11/03/2021  ? Reflux esophagitis 11/03/2021  ? Bloody diarrhea 04/06/2020  ? Generalized abdominal pain 04/06/2020  ? Well woman exam 06/19/2017  ? HLD (hyperlipidemia) 06/19/2017  ? Bipolar 2 disorder, major depressive episode ([place]) 05/13/2017  ? GAD (generalized anxiety disorder) 05/13/2017  ? Menorrhagia 01/25/2014  ? Family history of hypercholesterolemia 01/06/2013  ? Murmur 01/06/2013  ? ? ?Past Surgical History:  ?Procedure Laterality Date  ? COLONOSCOPY  08/2020  ? polyp, int hem, benign biopsies ([facility])  ? ESOPHAGOGASTRODUODENOSCOPY  08/2020  ? LA grade A reflux esophagitis and antral gastritis, H pylori neg, treated with PPI BID and carafate course ([facility]  ? OVARY SURGERY  06/30/2007  ? dx laparoscopy, reduction of right adnexal torsion, bilateral oophoropexy of each ovary to posterior uterine fundus  ? ? ?OB History   ? ? Gravida  ?0  ? Para  ?0  ? Term  ?0  ? Preterm  ?0  ? AB  ?0  ? Living  ?0  ?  ? ? SAB  ?0  ? IAB  ?0  ? Ectopic  ?0  ? Multiple  ?0  ? Live Births  ?0  ?   ?  ?  ? ? ? ?Home Medications   ? ?Prior to Admission medications   ?Medication Sig Start Date End Date Taking?  Authorizing Provider  ?amoxicillin (AMOXIL) 500 MG capsule Take 1 capsule (500 mg total) by mouth 3 (three) times daily. 02/12/22  Yes Fransico Meadow, PA-C  ?ARIPiprazole (ABILIFY) 10 MG tablet Take 1 tablet (10 mg total) by mouth at bedtime. 02/06/22   Orlene Erm, MD  ?fluvoxaMINE (LUVOX) 100 MG tablet Take 2 tablets (200 mg total) by mouth at bedtime. 02/06/22   Orlene Erm, MD  ?lamoTRIgine (LAMICTAL) 100 MG tablet Take 1.5 tablets (150 mg total) by mouth at bedtime. 02/06/22   Orlene Erm, MD  ?Levonorgestrel (KYLEENA IU) by Intrauterine route. 02/21/18 02/22/23  [provider]  ?traZODone (DESYREL) 100 MG tablet Take 1.5 tablets (150 mg total) by mouth at bedtime. 02/06/22   Orlene Erm, MD  ?dicyclomine (BENTYL) 10 MG capsule Take 1 capsule (10 mg total) by mouth 4 (four) times daily as needed for spasms. ?Patient not taking: Reported on 07/26/2020 07/08/20 07/26/20  Noralyn Pick, NP  ? ? ?Family History ?Family History  ?Problem Relation Age of Onset  ? Hepatitis B Mother   ? Anxiety disorder Mother   ? Hypertension Father   ? ADD / ADHD Brother   ? Ovarian cancer Paternal Grandmother   ? ? ?Social History ?Social  History  ? ?Tobacco Use  ? Smoking status: Never  ? Smokeless tobacco: Never  ?Vaping Use  ? Vaping Use: Former  ?Substance Use Topics  ? Alcohol use: Yes  ?  Comment: twice a month (5-7 drinks)  ? Drug use: Yes  ?  Types: Marijuana  ?  Comment: last use 16 Aug 2020  ? ? ? ?Allergies   ?Patient has no known allergies. ? ? ?Review of Systems ?Review of Systems  ?HENT:  Positive for ear pain.   ?All other systems reviewed and are negative. ? ? ?Physical Exam ?Triage Vital Signs ?ED Triage Vitals  ?Enc Vitals Group  ?   BP 02/12/22 1105 110/76  ?   Pulse Rate 02/12/22 1105 68  ?   Resp 02/12/22 1105 18  ?   Temp 02/12/22 1105 (!) 97.5 ?F (36.4 ?C)  ?   Temp src --   ?   SpO2 02/12/22 1105 98 %  ?   Weight --   ?   Height --   ?   Head Circumference --   ?   Peak Flow --    ?   Pain Score 02/12/22 1103 5  ?   Pain Loc --   ?   Pain Edu? --   ?   Excl. in [place]? --   ? ?No data found. ? ?Updated Vital Signs ?BP 110/76   Pulse 68   Temp (!) 97.5 ?F (36.4 ?C)   Resp 18   SpO2 98%  ? ?Visual Acuity ?Right Eye Distance:   ?Left Eye Distance:   ?Bilateral Distance:   ? ?Right Eye Near:   ?Left Eye Near:    ?Bilateral Near:    ? ?Physical Exam ?Vitals reviewed.  ?Constitutional:   ?   Appearance: Normal appearance.  ?HENT:  ?   Head: Normocephalic.  ?   Ears:  ?   Comments: Erythema right tm,  ?   Nose: Nose normal.  ?   Mouth/Throat:  ?   Mouth: Mucous membranes are moist.  ?Skin: ?   General: Skin is warm.  ?Neurological:  ?   General: No focal deficit present.  ?   Mental Status: She is alert.  ?Psychiatric:     ?   Mood and Affect: Mood normal.  ? ? ? ?UC Treatments / Results  ?Labs ?(all labs ordered are listed, but only abnormal results are displayed) ?Labs Reviewed - No data to display ? ?EKG ? ? ?Radiology ?No results found. ? ?Procedures ?Procedures (including critical care time) ? ?Medications Ordered in UC ?Medications - No data to display ? ?Initial Impression / Assessment and Plan / UC Course  ?I have reviewed the triage vital signs and the nursing notes. ? ?Pertinent labs & imaging results that were available during my care of the patient were reviewed by me and considered in my medical decision making (see chart for details). ? ?  ? ? ?Final Clinical Impressions(s) / UC Diagnoses  ? ?Final diagnoses:  ?Right otitis media, unspecified otitis media type  ? ? ? ?Discharge Instructions   ? ?  ?Return if any problems.   ? ? ?ED Prescriptions   ? ? Medication Sig Dispense Auth. Provider  ? amoxicillin (AMOXIL) 500 MG capsule Take 1 capsule (500 mg total) by mouth 3 (three) times daily. 30 capsule Fransico Meadow, [place]  ? ?  ? ?PDMP not reviewed this encounter. ?An After Visit Summary was printed and given to the patient.  ?  ?  Fransico Meadow, [place] ?02/21/22 2258 ? ?

## 2022-02-27 ENCOUNTER — Other Ambulatory Visit (HOSPITAL_COMMUNITY): Payer: Self-pay

## 2022-03-12 ENCOUNTER — Encounter: Payer: Self-pay | Admitting: Family Medicine

## 2022-03-13 ENCOUNTER — Telehealth: Payer: No Typology Code available for payment source | Admitting: Family Medicine

## 2022-03-14 ENCOUNTER — Encounter: Payer: Self-pay | Admitting: Family Medicine

## 2022-03-14 ENCOUNTER — Ambulatory Visit (INDEPENDENT_AMBULATORY_CARE_PROVIDER_SITE_OTHER): Payer: No Typology Code available for payment source | Admitting: Family Medicine

## 2022-03-14 VITALS — BP 118/72 | HR 84 | Temp 97.4°F | Ht 65.0 in | Wt 195.1 lb

## 2022-03-14 DIAGNOSIS — L709 Acne, unspecified: Secondary | ICD-10-CM | POA: Diagnosis not present

## 2022-03-14 DIAGNOSIS — H66002 Acute suppurative otitis media without spontaneous rupture of ear drum, left ear: Secondary | ICD-10-CM | POA: Diagnosis not present

## 2022-03-14 MED ORDER — AMOXICILLIN-POT CLAVULANATE 875-125 MG PO TABS: 1.0000 | ORAL_TABLET | Freq: Two times a day (BID) | ORAL | 0 refills | Status: AC

## 2022-03-14 MED ORDER — MUPIROCIN CALCIUM 2 % EX CREA: 1.0000 "application " | TOPICAL_CREAM | Freq: Two times a day (BID) | CUTANEOUS | 0 refills | Status: DC

## 2022-03-14 NOTE — Progress Notes (Signed)
Patient ID: Victoria Simmons, female    DOB: Sep 23, 1996, 26 y.o.   MRN: [id number]  This visit was conducted in person.  BP 118/72   Pulse 84   Temp (!) 97.4 F (36.3 C) (Temporal)   Ht '5\' 5"'$  (1.651 m)   Wt 195 lb 2 oz (88.5 kg)   LMP 03/11/2022   SpO2 95% Comment: RA  BMI 32.47 kg/m    CC: L>R ear pain  Subjective:   HPI: Victoria Simmons is a 26 y.o. female presenting on 03/14/2022 for Ear Pain (C/o bilateral ear pain, worse in L.  Seen on 02/12/22 at [facility] for ear inf, tx abx.  Sxs improved but did not totally clear up.  Accompanied by grandmother, Dewitt Hoes.)   1 month ago with R earache, seen at [facility] and treated with amoxicillin '500mg'$  tid with initial improvement but not full resolution.  Over the past 1 week notes worsening L ear pain and trouble hearing out of L ear. Mild headache, L ear tinnitus.   No fevers/chills, tooth pain, ST, PNdrainage, abd pain, nausea, dizziness or vertigo.  She's been taking ibuprofen without benefit. No prior recent history of acute otitis.      Relevant past medical, surgical, family and social history reviewed and updated as indicated. Interim medical history since our last visit reviewed. Allergies and medications reviewed and updated. Outpatient Medications Prior to Visit  Medication Sig Dispense Refill   ARIPiprazole (ABILIFY) 10 MG tablet Take 1 tablet (10 mg total) by mouth at bedtime. 30 tablet 1   fluvoxaMINE (LUVOX) 100 MG tablet Take 2 tablets (200 mg total) by mouth at bedtime. 60 tablet 1   lamoTRIgine (LAMICTAL) 100 MG tablet Take 1.5 tablets (150 mg total) by mouth at bedtime. 45 tablet 1   Levonorgestrel (KYLEENA IU) by Intrauterine route.     traZODone (DESYREL) 100 MG tablet Take 1.5 tablets (150 mg total) by mouth at bedtime. 45 tablet 1   amoxicillin (AMOXIL) 500 MG capsule Take 1 capsule (500 mg total) by mouth 3 (three) times daily. 30 capsule 0   No facility-administered medications prior to visit.     Per HPI unless  specifically indicated in ROS section below Review of Systems  Objective:  BP 118/72   Pulse 84   Temp (!) 97.4 F (36.3 C) (Temporal)   Ht '5\' 5"'$  (1.651 m)   Wt 195 lb 2 oz (88.5 kg)   LMP 03/11/2022   SpO2 95% Comment: RA  BMI 32.47 kg/m   Wt Readings from Last 3 Encounters:  03/14/22 195 lb 2 oz (88.5 kg)  12/12/21 195 lb (88.5 kg)  11/03/21 192 lb 4 oz (87.2 kg)      Physical Exam Vitals and nursing note reviewed.  Constitutional:      Appearance: Normal appearance. She is not ill-appearing.  HENT:     Head: Normocephalic and atraumatic.     Right Ear: Tympanic membrane, ear canal and external ear normal. There is no impacted cerumen. No mastoid tenderness. Tympanic membrane is not injected.     Left Ear: External ear normal. Decreased hearing noted. Swelling and tenderness present. There is impacted cerumen. No mastoid tenderness.     Ears:     Comments: R TM pearly grey. Discharge present to L external ear canal covering TM, unable to visualize TM, discomfort with otoscopy    Nose: Nose normal. No congestion or rhinorrhea.     Mouth/Throat:     Mouth: Mucous membranes  are moist.     Pharynx: Oropharynx is clear. No oropharyngeal exudate or posterior oropharyngeal erythema.  Eyes:     Extraocular Movements: Extraocular movements intact.     Conjunctiva/sclera: Conjunctivae normal.     Pupils: Pupils are equal, round, and reactive to light.  Neck:     Comments: No appreciable HEENT or cervical lymphadenopathy Cardiovascular:     Rate and Rhythm: Normal rate and regular rhythm.     Pulses: Normal pulses.     Heart sounds: Normal heart sounds. No murmur heard. Pulmonary:     Effort: Pulmonary effort is normal. No respiratory distress.     Breath sounds: Normal breath sounds. No wheezing, rhonchi or rales.  Musculoskeletal:     Cervical back: Normal range of motion and neck supple.  Lymphadenopathy:     Head:     Right side of head: No submental, submandibular,  tonsillar, preauricular or posterior auricular adenopathy.     Left side of head: No submental, submandibular, tonsillar, preauricular or posterior auricular adenopathy.     Cervical: No cervical adenopathy.     Right cervical: No superficial cervical adenopathy.    Left cervical: No superficial cervical adenopathy.     Upper Body:     Right upper body: No supraclavicular adenopathy.     Left upper body: No supraclavicular adenopathy.  Skin:    Findings: Rash present.     Comments: Cluster of pustules to R lower cheek next to lip with some crusting present  Neurological:     Mental Status: She is alert.  Psychiatric:        Mood and Affect: Mood normal.        Behavior: Behavior normal.       Assessment & Plan:   Problem List Items Addressed This Visit     Left acute suppurative otitis media - Primary    Anticipate L otitis media with possible rupture of TM given symptoms, less likely external otitis. Treated 1 month ago with amoxicillin course. Will Rx augmentin 10d course, advised let us know if not improving with treatment for referral to ENT for further eval, and cleaning of external ear canal. Pt agrees with plan.        Relevant Medications   amoxicillin-clavulanate (AUGMENTIN) 875-125 MG tablet   Acne    Concern for superinfection ?impetigo - Rx mupirocin topical.          Meds ordered this encounter  Medications   mupirocin cream (BACTROBAN) 2 %    Sig: Apply 1 application. topically 2 (two) times daily.    Dispense:  15 g    Refill:  0    Fill cream vs ointment based on affordability /cost   amoxicillin-clavulanate (AUGMENTIN) 875-125 MG tablet    Sig: Take 1 tablet by mouth 2 (two) times daily for 10 days.    Dispense:  20 tablet    Refill:  0   No orders of the defined types were placed in this encounter.    Patient Instructions  I think you have left ear infection - treat with antibiotic sent to pharmacy.  Take tylenol '500mg'$  1-2 tablets twice daily  with food for discomfort.  If not improving with this, let us know for referral to ENT for ear cleaning and further evaluation.   Touch base with GI about scheduling appointment: [phone] option 1  Follow up plan: Return if symptoms worsen or fail to improve.  Ria Bush, MD

## 2022-03-14 NOTE — Assessment & Plan Note (Signed)
Concern for superinfection ?impetigo - Rx mupirocin topical.

## 2022-03-14 NOTE — Assessment & Plan Note (Signed)
Anticipate L otitis media with possible rupture of TM given symptoms, less likely external otitis. Treated 1 month ago with amoxicillin course. Will Rx augmentin 10d course, advised let us know if not improving with treatment for referral to ENT for further eval, and cleaning of external ear canal. Pt agrees with plan.

## 2022-03-14 NOTE — Patient Instructions (Addendum)
I think you have left ear infection - treat with antibiotic sent to pharmacy.  Take tylenol '500mg'$  1-2 tablets twice daily with food for discomfort.  If not improving with this, let us know for referral to ENT for ear cleaning and further evaluation.   Touch base with GI about scheduling appointment: [phone] option 1

## 2022-04-02 ENCOUNTER — Other Ambulatory Visit (HOSPITAL_COMMUNITY): Payer: Self-pay

## 2022-04-11 ENCOUNTER — Telehealth: Payer: Self-pay | Admitting: Child and Adolescent Psychiatry

## 2022-04-11 ENCOUNTER — Telehealth (INDEPENDENT_AMBULATORY_CARE_PROVIDER_SITE_OTHER): Payer: No Typology Code available for payment source | Admitting: Child and Adolescent Psychiatry

## 2022-04-11 DIAGNOSIS — Z79899 Other long term (current) drug therapy: Secondary | ICD-10-CM

## 2022-04-11 DIAGNOSIS — E559 Vitamin D deficiency, unspecified: Secondary | ICD-10-CM

## 2022-04-11 DIAGNOSIS — F411 Generalized anxiety disorder: Secondary | ICD-10-CM | POA: Diagnosis not present

## 2022-04-11 DIAGNOSIS — F3181 Bipolar II disorder: Secondary | ICD-10-CM

## 2022-04-11 MED ORDER — FLUVOXAMINE MALEATE 100 MG PO TABS: 200.0000 mg | ORAL_TABLET | Freq: Every evening | ORAL | 1 refills | Status: DC

## 2022-04-11 MED ORDER — LAMOTRIGINE 100 MG PO TABS: 150.0000 mg | ORAL_TABLET | Freq: Every day | ORAL | 1 refills | Status: DC

## 2022-04-11 MED ORDER — TRAZODONE HCL 100 MG PO TABS: 150.0000 mg | ORAL_TABLET | Freq: Every day | ORAL | 1 refills | Status: DC

## 2022-04-11 MED ORDER — ARIPIPRAZOLE 10 MG PO TABS: 10.0000 mg | ORAL_TABLET | Freq: Every day | ORAL | 1 refills | Status: DC

## 2022-04-11 NOTE — Telephone Encounter (Signed)
Lab orders emailed from the [facility] to patient at provider's request.

## 2022-04-11 NOTE — Progress Notes (Signed)
Virtual Visit via Video Note  I connected with Victoria Simmons on 04/11/22 at 10:00 AM EDT by a video enabled telemedicine application and verified that I am speaking with the correct person using two identifiers.  Location: Patient: at work Provider: office   I discussed the limitations of evaluation and management by telemedicine and the availability of in person appointments. The patient expressed understanding and agreed to proceed.    I discussed the assessment and treatment plan with the patient. The patient was provided an opportunity to ask questions and all were answered. The patient agreed with the plan and demonstrated an understanding of the instructions.   The patient was advised to call back or seek an in-person evaluation if the symptoms worsen or if the condition fails to improve as anticipated.  I provided 15 minutes of non-face-to-face time during this encounter.   Victoria Erm, MD      [facility] MD/PA/NP OP Progress Note  04/11/2022 10:29 AM Victoria Simmons  MRN:  [id number]  Chief Complaint: "I am doing good..."(pt)  Synopsis: Victoria Simmons is a 26 year old Caucasian female, employed at [employer] and [employer] store in [place], with history of bipolar disorder type II, GAD, insomnia was seen by Dr. Einar Grad since 2017 and transition to this writer in April 2020.  She is currently prescribed Lamictal 150 mg once a day, Abilify 10 mg once a day, trazodone 150 mg once a day and Luvox 150 mg once a day.   HPI:  Patient was seen and evaluated over telemedicine encounter for medication management follow-up.  She was present by herself and was evaluated alone.    She reports that she has continued to do well since the last appointment.  She reports that her mood has been stable and rates it at around 8-9 out of 10, 10 being the best mood.  She denies any high highs or low lows.  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 and has been making good money and  saving some with the plan to eventually get her own place in 6 months with her wife.  She reports that she has moved out of her parent's home, to an apartment on the property and has been doing well with it.  She reports that she has been spending time painting, listening to music and at the pool.  She has been sleeping well, denies any problems with energy, eating better, and denies any SI or HI.  She also denies any excessive worries or anxiety.  She denies any new psychosocial stressors.  She was recommended to have an annual physical exam with her PCP.  We also discussed to obtain blood work to get her metabolic panel.  We discussed lifestyle modifications.  Discussed to continue with current medications and follow back again in 2 months or earlier if needed.  She reports that she has been compliant with her medications however has not been seeing therapist but will call back to the front desk and make an appointment with therapist.   Visit Diagnosis:    ICD-10-CM   1. Bipolar 2 disorder, major depressive episode (HCC)  F31.81 ARIPiprazole (ABILIFY) 10 MG tablet    lamoTRIgine (LAMICTAL) 100 MG tablet    2. GAD (generalized anxiety disorder)  F41.1 fluvoxaMINE (LUVOX) 100 MG tablet    3. Other long term (current) drug therapy  Z79.899 CBC With Differential    Comprehensive metabolic panel    Hemoglobin A1c  Lipid panel    4. Vitamin D deficiency  E55.9 VITAMIN D 25 Hydroxy (Vit-D Deficiency, Fractures)       Past Psychiatric History: PPHx reviewed today and as followin. 2 previous inpatient admission at [facility] at the age of 26, and last at [facility] no previous suicide attempts, has history of cutting and her past medication trials include Prozac and Zoloft.  Decreasing Luvox to 50 mg worsened her symptoms of anxiety therefore it was increased back to 100 mg once a day.   Previous trials of Zoloft 150 mg and Prozac 10 mg stopped due to inefficacy.   Past Medical History:  Past Medical  History:  Diagnosis Date   Anxiety    Bipolar 2 disorder ([place])    Depression    Dysmenorrhea    Hypertriglyceridemia 2020   MDD (major depressive disorder) 06/19/2017   Ovarian torsion 2008    Past Surgical History:  Procedure Laterality Date   COLONOSCOPY  08/2020   polyp, int hem, benign biopsies ([place])   ESOPHAGOGASTRODUODENOSCOPY  08/2020   LA grade A reflux esophagitis and antral gastritis, H pylori neg, treated with PPI BID and carafate course ([place]  06/30/2007   dx laparoscopy, reduction of right adnexal torsion, bilateral oophoropexy of each ovary to posterior uterine fundus    Family Psychiatric History: As mentioned in initial H&P, reviewed today, no change Family History:  Family History  Problem Relation Age of Onset   Hepatitis B Mother    Anxiety disorder Mother    Hypertension Father    ADD / ADHD Brother    Ovarian cancer Paternal Grandmother     Social History:  Social History   Socioeconomic History   Marital status: Married    Spouse name: Not on file   Number of children: 0   Years of education: Not on file   Highest education level: Some college, no degree  Occupational History   Occupation: [employer]    Comment: full time  Tobacco Use   Smoking status: Never   Smokeless tobacco: Never  Vaping Use   Vaping Use: Former  Substance and Sexual Activity   Alcohol use: Yes    Comment: twice a month (5-7 drinks)   Drug use: Yes    Types: Marijuana    Comment: last use 16 Aug 2020   Sexual activity: Yes  Other Topics Concern   Not on file  Social History Narrative          Social Determinants of Health   Financial Resource Strain: Low Risk  (08/26/2017)   Overall Financial Resource Strain (CARDIA)    Difficulty of Paying Living Expenses: Not hard at all  Food Insecurity: No Food Insecurity (08/26/2017)   Hunger Vital Sign    Worried About Running Out of Food in the Last Year: Never true    Ran Out of  Food in the Last Year: Never true  Transportation Needs: No Transportation Needs (08/26/2017)   PRAPARE - Hydrologist (Medical): No    Lack of Transportation (Non-Medical): No  Physical Activity: Sufficiently Active (08/26/2017)   Exercise Vital Sign    Days of Exercise per Week: 5 days    Minutes of Exercise per Session: 60 min  Stress: No Stress Concern Present (08/26/2017)   [place]    Feeling of Stress : Not at all  Social Connections: Moderately Isolated (08/26/2017)   Social Connection  and Isolation Panel [NHANES]    Frequency of Communication with Friends and Family: Three times a week    Frequency of Social Gatherings with Friends and Family: More than three times a week    Attends Religious Services: Never    Marine scientist or Organizations: No    Attends Music therapist: Never    Marital Status: Never married    Allergies: No Known Allergies  Metabolic Disorder Labs: Lab Results  Component Value Date   HGBA1C 5.0 07/26/2020   MPG 97 07/26/2020   MPG 94 05/14/2017   No results found for: "PROLACTIN" Lab Results  Component Value Date   CHOL 226 (H) 07/26/2020   TRIG 138 07/26/2020   HDL 44 07/26/2020   CHOLHDL 5.1 07/26/2020   VLDL 28 07/26/2020   LDLCALC 154 (H) 07/26/2020   [place] 103 (H) 04/05/2020   Lab Results  Component Value Date   TSH 2.70 09/12/2021   TSH 2.401 07/26/2020    Therapeutic Level Labs: No results found for: "LITHIUM" No results found for: "VALPROATE" No results found for: "CBMZ"  Current Medications: Current Outpatient Medications  Medication Sig Dispense Refill   ARIPiprazole (ABILIFY) 10 MG tablet Take 1 tablet (10 mg total) by mouth at bedtime. 30 tablet 1   fluvoxaMINE (LUVOX) 100 MG tablet Take 2 tablets (200 mg total) by mouth at bedtime. 60 tablet 1   lamoTRIgine (LAMICTAL) 100 MG tablet Take 1.5 tablets  (150 mg total) by mouth at bedtime. 45 tablet 1   Levonorgestrel (KYLEENA IU) by Intrauterine route.     mupirocin cream (BACTROBAN) 2 % Apply 1 application. topically 2 (two) times daily. 15 g 0   traZODone (DESYREL) 100 MG tablet Take 1.5 tablets (150 mg total) by mouth at bedtime. 45 tablet 1   No current facility-administered medications for this visit.     Musculoskeletal: Strength & Muscle Tone: unable to assess since visit was over the telemedicine.  Gait & Station: unable to assess since visit was over the telemedicine.  Patient leans: N/A  Psychiatric Specialty Exam: ROSReview of 12 systems negative except as mentioned in HPI   Last menstrual period 03/11/2022.There is no height or weight on file to calculate BMI.  General Appearance: Casual and with tattoos on multiple sites on her arm and lower end of throat.   Eye Contact:  Good  Speech:  Clear and Coherent and Normal Rate  Volume:  Normal  Mood: "good..."  Affect:  Appropriate, Congruent, and Restricted  Thought Process:  Goal Directed and Linear  Orientation:  Full (Time, Place, and Person)  Thought Content: Logical   Suicidal Thoughts:  No  Homicidal Thoughts:  No  Memory:  Immediate;   Fair Recent;   Fair Remote;   Fair  Judgement:  Good  Insight:  Good  Psychomotor Activity:  Normal  Concentration:  Concentration: Good and Attention Span: Good  Recall:  Good  Fund of Knowledge: Good  Language: Good  Akathisia:  No    AIMS (if indicated): not done  Assets:  Communication Skills Desire for Improvement Financial Resources/Insurance Housing Leisure Time Physical Health Social Support Transportation Vocational/Educational  ADL's:  Intact  Cognition: WNL  Sleep:  Good     Screenings: AIMS    Flowsheet Row Admission (Discharged) from 05/13/2017 in [place] Total Score 0      AUDIT    Flowsheet Row Admission (Discharged) from 07/27/2020 in [place]  400B Admission (Discharged) from 05/13/2017 in [place]  Alcohol Use Disorder Identification Test Final Score (AUDIT) 6 0      GAD-7    Flowsheet Row Counselor from 10/04/2021 in [place] Office Visit from 04/05/2020 in [place] at [facility]  Total GAD-7 Score 15 7      PHQ2-9    [place] from 10/04/2021 in [place] Office Visit from 06/15/2021 in [place] from 05/01/2021 in [place] from 03/23/2021 in [place] from 11/23/2020 in [place]  PHQ-2 Total Score 3 1 0 0 0  PHQ-9 Total Score 17 -- -- -- --      [place] ED from 02/12/2022 in [facility]  ED from 12/12/2021 in [facility] at [place] from 10/04/2021 in [place] No Risk No Risk Low Risk        Assessment and Plan:   Arcelia is a 26 yr old Caucasian female who is single, employed, has a history of bipolar disorder, generalized anxiety disorder, insomnia evaluated today for a follow-up visit for medication management.   She appears to have continued stability with mood, anxiety and overall seems to be doing well and denies any new psychosocial stressors.  Recommending to continue with current medications and restart individual therapy.  She will follow in 2 months or early if needed.    Plan as below.  Reviewed on 04/11/2022  Plan  Bipolar 2 disorder (chronic, stable) Continue with lamotrigine 150 mg daily.  Continue Luvox  200 mg p.o. daily Continue Abilify at '10mg'$  po qd.   For generalized anxiety disorder-(chronic and stable) -  ind therapy at [facility],  Luvox as mentioned above.  Medications above will be helpful in  decreasing anxiety. Previously tried Prozac upto 10 mg daily and Zoloft 150 mg daily - stopped due to inefficacy.    For insomnia-chronic and stable Continue trazodone '150mg'$  p.o. nightly Can take Atarax 25-50 mg QHS PRN for sleep.   Labs including CBC, CMP, TSH were ordered by PCP and appears stable. She is yet to do lipid panel, hemoglobin A1c, vitamin D and B12 levels were ordered.   This note was generated in part or whole with voice recognition software. Voice recognition is usually quite accurate but there are transcription errors that can and very often do occur. I apologize for any typographical errors that were not detected and corrected.    MDM = 2 or more chronic stable conditions + med management  Victoria Erm, MD 04/11/2022, 10:29 AM

## 2022-05-03 ENCOUNTER — Other Ambulatory Visit (HOSPITAL_COMMUNITY): Payer: Self-pay

## 2022-05-03 ENCOUNTER — Other Ambulatory Visit: Payer: Self-pay | Admitting: Child and Adolescent Psychiatry

## 2022-05-03 DIAGNOSIS — F411 Generalized anxiety disorder: Secondary | ICD-10-CM

## 2022-05-03 DIAGNOSIS — F3181 Bipolar II disorder: Secondary | ICD-10-CM

## 2022-05-03 NOTE — Telephone Encounter (Signed)
Rx were sent on 07/05 with one refill. Too early to send a new rx.

## 2022-05-04 ENCOUNTER — Other Ambulatory Visit (HOSPITAL_COMMUNITY): Payer: Self-pay

## 2022-05-31 ENCOUNTER — Other Ambulatory Visit: Payer: Self-pay | Admitting: Child and Adolescent Psychiatry

## 2022-05-31 DIAGNOSIS — F411 Generalized anxiety disorder: Secondary | ICD-10-CM

## 2022-05-31 DIAGNOSIS — F3181 Bipolar II disorder: Secondary | ICD-10-CM

## 2022-06-12 ENCOUNTER — Other Ambulatory Visit (HOSPITAL_COMMUNITY): Payer: Self-pay

## 2022-06-12 MED FILL — Aripiprazole Tab 10 MG: ORAL | 90 days supply | Qty: 90 | Fill #0 | Status: AC

## 2022-06-12 MED FILL — Trazodone HCl Tab 100 MG: ORAL | 90 days supply | Qty: 135 | Fill #0 | Status: AC

## 2022-06-12 MED FILL — Fluvoxamine Maleate Tab 100 MG: ORAL | 90 days supply | Qty: 180 | Fill #0 | Status: AC

## 2022-06-12 MED FILL — Lamotrigine Tab 100 MG: ORAL | 30 days supply | Qty: 45 | Fill #0 | Status: AC

## 2022-06-13 ENCOUNTER — Telehealth (INDEPENDENT_AMBULATORY_CARE_PROVIDER_SITE_OTHER): Payer: No Typology Code available for payment source | Admitting: Child and Adolescent Psychiatry

## 2022-06-13 ENCOUNTER — Other Ambulatory Visit (HOSPITAL_COMMUNITY): Payer: Self-pay

## 2022-06-13 DIAGNOSIS — F411 Generalized anxiety disorder: Secondary | ICD-10-CM

## 2022-06-13 DIAGNOSIS — F3181 Bipolar II disorder: Secondary | ICD-10-CM

## 2022-06-13 NOTE — Progress Notes (Signed)
Virtual Visit via Video Note  I connected with Victoria Simmons on 06/13/22 at  9:00 AM EDT by a video enabled telemedicine application and verified that I am speaking with the correct person using two identifiers.  Location: Patient: at work Provider: office   I discussed the limitations of evaluation and management by telemedicine and the availability of in person appointments. The patient expressed understanding and agreed to proceed.    I discussed the assessment and treatment plan with the patient. The patient was provided an opportunity to ask questions and all were answered. The patient agreed with the plan and demonstrated an understanding of the instructions.   The patient was advised to call back or seek an in-person evaluation if the symptoms worsen or if the condition fails to improve as anticipated.  I provided 15 minutes of non-face-to-face time during this encounter.   Orlene Erm, MD      [facility] MD/PA/NP OP Progress Note  06/13/2022 9:29 AM Victoria Simmons  MRN:  [id number]  Chief Complaint: "I am doing good..(pt)"  Synopsis: Despina is a 26 year old Caucasian female, employed at [employer] and [employer] store in [place], with history of bipolar disorder type II, GAD, insomnia was seen by Dr. Einar Grad since 2017 and transition to this writer in April 2020.  She is currently prescribed Lamictal 150 mg once a day, Abilify 10 mg once a day, trazodone 150 mg once a day and Luvox 150 mg once a day.   HPI:   Kambri was seen and evaluated over telemedicine encounter for medication management follow-up.  She was present by herself and was evaluated alone.  She reports that she has continued to work at [employer], work has been going well, she likes her work, works about 4 to 5 days a week.  She reports that in the interim since last appointment she had her diabetes following her decision to divorce her wife.  She reports that following this for a couple of days she had depressive mood,  had suicidal thoughts but she was able to bounce back, with the help of her friends.  She reports that she realized that it is a small thing in the big scheme of her life and that has helped her move on.  She denies any suicidal thoughts or nonsuicidal self-harm behaviors or thoughts since then.  She reports that for the last 3 weeks her mood has been "pretty good", rates it at around 8 out of 10, 10 being the best mood.  She reports that she has been spending a lot of time with her friends, going out on walks etc.  She reports that her anxiety is in the middle, is manageable.  She denies any problems with sleep, sleep is restful, denies problems with energy, denies any problems with appetite and reports that she is eating better.  She reports that she has stayed compliant with her medications and denies any side effects from them.  She reports that she wants to get back in therapy, and is trying to reach out to her old therapist but has not been successful.  I discussed with her that I will send the message to her old therapist and cooperative from desk and hoping that she can be seen again.  She verbalized understanding.  We discussed to continue with current medications for now because of the stability in her symptoms.  Sent a message to her previous therapist, as well as front desk and they will schedule an appointment for  therapy with her.  Plan to follow back again in 2 months or earlier if needed  Visit Diagnosis:    ICD-10-CM   1. GAD (generalized anxiety disorder)  F41.1     2. Bipolar 2 disorder, major depressive episode ([place])  F31.81        Past Psychiatric History: PPHx reviewed today and as followin. 2 previous inpatient admission at [facility] at the age of 75, and last at [facility], no previous suicide attempts, has history of cutting and her past medication trials include Prozac and Zoloft.  Decreasing Luvox to 50 mg worsened her symptoms of anxiety therefore it was increased back to 100 mg once a  day.   Previous trials of Zoloft 150 mg and Prozac 10 mg stopped due to inefficacy.   Past Medical History:  Past Medical History:  Diagnosis Date   Anxiety    Bipolar 2 disorder ([place])    Depression    Dysmenorrhea    Hypertriglyceridemia 2020   MDD (major depressive disorder) 06/19/2017   Ovarian torsion 2008    Past Surgical History:  Procedure Laterality Date   COLONOSCOPY  08/2020   polyp, int hem, benign biopsies ([place])   ESOPHAGOGASTRODUODENOSCOPY  08/2020   LA grade A reflux esophagitis and antral gastritis, H pylori neg, treated with PPI BID and carafate course ([place]  06/30/2007   dx laparoscopy, reduction of right adnexal torsion, bilateral oophoropexy of each ovary to posterior uterine fundus    Family Psychiatric History: As mentioned in initial H&P, reviewed today, no change Family History:  Family History  Problem Relation Age of Onset   Hepatitis B Mother    Anxiety disorder Mother    Hypertension Father    ADD / ADHD Brother    Ovarian cancer Paternal Grandmother     Social History:  Social History   Socioeconomic History   Marital status: Married    Spouse name: Not on file   Number of children: 0   Years of education: Not on file   Highest education level: Some college, no degree  Occupational History   Occupation: [employer]    Comment: full time  Tobacco Use   Smoking status: Never   Smokeless tobacco: Never  Vaping Use   Vaping Use: Former  Substance and Sexual Activity   Alcohol use: Yes    Comment: twice a month (5-7 drinks)   Drug use: Yes    Types: Marijuana    Comment: last use 16 Aug 2020   Sexual activity: Yes  Other Topics Concern   Not on file  Social History Narrative          Social Determinants of Health   Financial Resource Strain: Low Risk  (08/26/2017)   Overall Financial Resource Strain (CARDIA)    Difficulty of Paying Living Expenses: Not hard at all  Food Insecurity: No Food  Insecurity (08/26/2017)   Hunger Vital Sign    Worried About Running Out of Food in the Last Year: Never true    Ran Out of Food in the Last Year: Never true  Transportation Needs: No Transportation Needs (08/26/2017)   PRAPARE - Hydrologist (Medical): No    Lack of Transportation (Non-Medical): No  Physical Activity: Sufficiently Active (08/26/2017)   Exercise Vital Sign    Days of Exercise per Week: 5 days    Minutes of Exercise per Session: 60 min  Stress: No Stress Concern Present (08/26/2017)  [employer] - Occupational Stress Questionnaire    Feeling of Stress : Not at all  Social Connections: Moderately Isolated (08/26/2017)   Social Connection and Isolation Panel [NHANES]    Frequency of Communication with Friends and Family: Three times a week    Frequency of Social Gatherings with Friends and Family: More than three times a week    Attends Religious Services: Never    Marine scientist or Organizations: No    Attends Music therapist: Never    Marital Status: Never married    Allergies: No Known Allergies  Metabolic Disorder Labs: Lab Results  Component Value Date   HGBA1C 5.0 07/26/2020   MPG 97 07/26/2020   MPG 94 05/14/2017   No results found for: "PROLACTIN" Lab Results  Component Value Date   CHOL 226 (H) 07/26/2020   TRIG 138 07/26/2020   HDL 44 07/26/2020   CHOLHDL 5.1 07/26/2020   VLDL 28 07/26/2020   LDLCALC 154 (H) 07/26/2020   [place] 103 (H) 04/05/2020   Lab Results  Component Value Date   TSH 2.70 09/12/2021   TSH 2.401 07/26/2020    Therapeutic Level Labs: No results found for: "LITHIUM" No results found for: "VALPROATE" No results found for: "CBMZ"  Current Medications: Current Outpatient Medications  Medication Sig Dispense Refill   ARIPiprazole (ABILIFY) 10 MG tablet Take 1 tablet (10 mg total) by mouth at bedtime. 90 tablet 1   fluvoxaMINE (LUVOX) 100 MG  tablet TAKE 2 TABLETS BY MOUTH AT BEDTIME. 180 tablet 1   lamoTRIgine (LAMICTAL) 100 MG tablet Take 1.5 tablets (150 mg total) by mouth at bedtime. 45 tablet 1   Levonorgestrel (KYLEENA IU) by Intrauterine route.     mupirocin cream (BACTROBAN) 2 % Apply 1 application. topically 2 (two) times daily. 15 g 0   traZODone (DESYREL) 100 MG tablet Take 1.5 tablets (150 mg total) by mouth at bedtime. 135 tablet 1   No current facility-administered medications for this visit.     Musculoskeletal: Strength & Muscle Tone: unable to assess since visit was over the telemedicine.  Gait & Station: unable to assess since visit was over the telemedicine.  Patient leans: N/A  Psychiatric Specialty Exam: ROSReview of 12 systems negative except as mentioned in HPI   There were no vitals taken for this visit.There is no height or weight on file to calculate BMI.  General Appearance: Casual and Well Groomed  Eye Contact:  Good  Speech:  Clear and Coherent and Normal Rate  Volume:  Normal  Mood: "good..."  Affect:  Appropriate, Congruent, and Full Range  Thought Process:  Goal Directed and Linear  Orientation:  Full (Time, Place, and Person)  Thought Content: Logical   Suicidal Thoughts:  No  Homicidal Thoughts:  No  Memory:  Immediate;   Fair Recent;   Fair Remote;   Fair  Judgement:  Good  Insight:  Good  Psychomotor Activity:  Normal  Concentration:  Concentration: Good and Attention Span: Good  Recall:  Good  Fund of Knowledge: Good  Language: Good  Akathisia:  No    AIMS (if indicated): not done  Assets:  Communication Skills Desire for Improvement Financial Resources/Insurance Housing Leisure Time Physical Health Social Support Transportation Vocational/Educational  ADL's:  Intact  Cognition: WNL  Sleep:  Good     Screenings: AIMS    Flowsheet Row Admission (Discharged) from 05/13/2017 in [place] Total Score 0  AUDIT    Flowsheet  Row Admission (Discharged) from 07/27/2020 in [place] [facility]B Admission (Discharged) from 05/13/2017 in [place]  Alcohol Use Disorder Identification Test Final Score (AUDIT) 6 0      GAD-7    Flowsheet Row Counselor from 10/04/2021 in [place] Office Visit from 04/05/2020 in [place] at [facility]  Total GAD-7 Score 15 7      PHQ2-9    [place] from 10/04/2021 in [place] Office Visit from 06/15/2021 in [place] from 05/01/2021 in [place] from 03/23/2021 in [place] from 11/23/2020 in [place]  PHQ-2 Total Score 3 1 0 0 0  PHQ-9 Total Score 17 -- -- -- --      [place] [facility] from 02/12/2022 in [facility]  ED from 12/12/2021 in [facility] [place] from 10/04/2021 in [place] No Risk No Risk Low Risk        Assessment and Plan:   Kylieann is a 26 yr old Caucasian female who is single, employed, has a history of bipolar disorder, generalized anxiety disorder, insomnia evaluated today for a follow-up visit for medication management.   She appears to have continued stability with mood, anxiety and overall seems to be doing well and despite new psychosocial stressor of divorce.  Recommending to continue with current medications and restart individual therapy.  She will follow in 2 months or early if needed.    Plan as below.  Reviewed on 06/13/2022  Plan  Bipolar 2 disorder (chronic, stable) Continue with lamotrigine 150 mg daily.  Continue Luvox  200 mg p.o. daily Continue Abilify at '10mg'$  po qd.   For generalized anxiety disorder-(chronic and stable) -  ind therapy at [facility],   Luvox as mentioned above.  Medications above will be helpful in decreasing anxiety. Previously tried Prozac upto 10 mg daily and Zoloft 150 mg daily - stopped due to inefficacy.    For insomnia-chronic and stable Continue trazodone '150mg'$  p.o. nightly Can take Atarax 25-50 mg QHS PRN for sleep.   Labs including CBC, CMP, TSH were ordered by PCP and appears stable. She is yet to do lipid panel, hemoglobin A1c, vitamin D and B12 levels were ordered , she has not done yet.   This note was generated in part or whole with voice recognition software. Voice recognition is usually quite accurate but there are transcription errors that can and very often do occur. I apologize for any typographical errors that were not detected and corrected.    MDM = 2 or more chronic stable conditions + med management  Orlene Erm, MD 06/13/2022, 9:29 AM

## 2022-07-30 ENCOUNTER — Ambulatory Visit (INDEPENDENT_AMBULATORY_CARE_PROVIDER_SITE_OTHER): Payer: No Typology Code available for payment source | Admitting: Licensed Clinical Social Worker

## 2022-07-30 DIAGNOSIS — F3181 Bipolar II disorder: Secondary | ICD-10-CM

## 2022-07-30 DIAGNOSIS — F411 Generalized anxiety disorder: Secondary | ICD-10-CM

## 2022-07-30 NOTE — Progress Notes (Signed)
Virtual Visit via Video Note  I connected with YOLANDER GOODIE on 07/30/22 at  9:00 AM EDT by a video enabled telemedicine application and verified that I am speaking with the correct person using two identifiers.  Location: Patient: home Provider: remote office [place], [place])   I discussed the limitations of evaluation and management by telemedicine and the availability of in person appointments. The patient expressed understanding and agreed to proceed.   I discussed the assessment and treatment plan with the patient. The patient was provided an opportunity to ask questions and all were answered. The patient agreed with the plan and demonstrated an understanding of the instructions.   The patient was advised to call back or seek an in-person evaluation if the symptoms worsen or if the condition fails to improve as anticipated.  I provided 35 minutes of non-face-to-face time during this encounter.   [place], LCSW  THERAPIST PROGRESS NOTE  Session Time: [phone]  Participation Level: Active  Behavioral Response: Neat and Well GroomedAlertAnxious  Type of Therapy: Individual Therapy  Treatment Goals addressed:   Interventions: CBT, trauma focused   Summary: Tyneshia ROSALEA WITHROW is a 26 y.o. female who presents with symptoms consistent with bipolar II diagnosis.   Allowed pt to explore and express thoughts and feelings associated with recent life situations and external stressors. Discussed relationship w/ wife--currently estranged. Allowed pt safe space to explore issues/concerns with the relationship and how pt is feeling now. Pt reports that there were some trust issues and pt feels good about herself, her support system, and relationship with her family members presently. Pt reports she feels good about her overall mental health.  Allowed pt to identify triggers for depression and anxiety and reviewed coping skills that pt feels are most helpful for her. Discussed upcoming  holidays and pt identified some trigger points.   Continued recommendations are as follows: self care behaviors, positive social engagements, focusing on overall work/home/life balance, and focusing on positive physical and emotional wellness.   Suicidal/Homicidal: No  Therapist Response: Pt is continuing to apply interventions learned in session into daily life situations. Pt is currently on track to meet goals utilizing interventions mentioned above. Personal growth and progress noted. Treatment to continue as indicated.   Plan: Return again in 4 weeks.  Diagnosis:  Encounter Diagnoses  Name Primary?   Bipolar 2 disorder, major depressive episode ([place]) Yes   GAD (generalized anxiety disorder)     Collaboration of Care: Other pt encouraged to continue care with psychiatrist of record, Dr. Leotis Shames.  Patient/Guardian was advised Release of Information must be obtained prior to any record release in order to collaborate their care with an outside provider. Patient/Guardian was advised if they have not already done so to contact the registration department to sign all necessary forms in order for [name] to release information regarding their care.   Consent: Patient/Guardian gives verbal consent for treatment and assignment of benefits for services provided during this visit. Patient/Guardian expressed understanding and agreed to proceed.    [place], LCSW 07/30/2022

## 2022-07-31 ENCOUNTER — Other Ambulatory Visit (HOSPITAL_COMMUNITY): Payer: Self-pay

## 2022-07-31 MED FILL — Lamotrigine Tab 100 MG: ORAL | 30 days supply | Qty: 45 | Fill #1 | Status: AC

## 2022-08-15 ENCOUNTER — Other Ambulatory Visit (HOSPITAL_COMMUNITY): Payer: Self-pay

## 2022-08-15 ENCOUNTER — Telehealth (INDEPENDENT_AMBULATORY_CARE_PROVIDER_SITE_OTHER): Payer: No Typology Code available for payment source | Admitting: Child and Adolescent Psychiatry

## 2022-08-15 DIAGNOSIS — F3181 Bipolar II disorder: Secondary | ICD-10-CM

## 2022-08-15 DIAGNOSIS — F411 Generalized anxiety disorder: Secondary | ICD-10-CM

## 2022-08-15 MED ORDER — LAMOTRIGINE 100 MG PO TABS
150.0000 mg | ORAL_TABLET | Freq: Every day | ORAL | 1 refills | Status: DC
  Filled 2022-08-15 – 2022-09-05 (×2): qty 135, 90d supply, fill #0
  Filled 2022-12-21 – 2022-12-25 (×2): qty 135, 90d supply, fill #1

## 2022-08-15 NOTE — Progress Notes (Signed)
Virtual Visit via Video Note  I connected with Victoria Simmons on 08/15/22 at  9:00 AM EST by a video enabled telemedicine application and verified that I am speaking with the correct person using two identifiers.  Location: Patient: at work Provider: office   I discussed the limitations of evaluation and management by telemedicine and the availability of in person appointments. The patient expressed understanding and agreed to proceed.    I discussed the assessment and treatment plan with the patient. The patient was provided an opportunity to ask questions and all were answered. The patient agreed with the plan and demonstrated an understanding of the instructions.   The patient was advised to call back or seek an in-person evaluation if the symptoms worsen or if the condition fails to improve as anticipated.  I provided 15 minutes of non-face-to-face time during this encounter.   Victoria Erm, MD      [facility] MD/PA/NP OP Progress Note  08/15/2022 9:34 AM Victoria Simmons  MRN:  [id number]  Chief Complaint: "I am doing good..."(pt)  Synopsis: Clarity is a 26 year old Caucasian female, employed at [employer] store in [place], with history of bipolar disorder type II, GAD, insomnia was seen by Dr. Einar Grad since 2017 and transition to this writer in April 2020.  She is currently prescribed Lamictal 150 mg once a day, Abilify 10 mg once a day, trazodone 150 mg once a day and Luvox 150 mg once a day.   HPI:   Victoria Simmons was seen and evaluated over telemedicine encounter for medication management follow-up.  She was present by herself and was evaluated alone.  She reports that she continues to do well, and denies any new concerns for today's appointment.  She reports that her mood has been "good", denies any high highs or low lows.  She reports that yesterday her grandfather had a stroke and seizure which made her feel sad but other than that she is not having any problems with mood.  She is  still continues to work full-time at [employer] in [place], likes her work very much.  She enjoys spending time with her friends when she is free.  She also recently started going to the gym with her father, goes early in the morning about 3 times a week.  She reports that she has occasional anxiety but it is manageable and denies any concerns regarding anxiety.  She reports that she sleeps well, denies problems with appetite, denies any SI or HI.  She reports that she drinks about 2-3 drinks every 1 to 2 months, uses legal THC often.  Denies any other substance use.  She reports that she has been compliant with her medications and denies any problems with them.  She recently saw a therapist and has another follow-up next month.  We discussed to continue with current medications because of the stability in her symptoms and follow back again in about 2 months or earlier if needed.  She was again reminded to do the blood work and she agrees to do it soon.   Visit Diagnosis:    ICD-10-CM   1. Bipolar 2 disorder, major depressive episode (HCC)  F31.81 lamoTRIgine (LAMICTAL) 100 MG tablet    2. GAD (generalized anxiety disorder)  F41.1         Past Psychiatric History: PPHx reviewed today and as followin. 2 previous inpatient admission at [facility] at the age of 24, and last at [facility], no previous suicide attempts, has history of  cutting and her past medication trials include Prozac and Zoloft.  Decreasing Luvox to 50 mg worsened her symptoms of anxiety therefore it was increased back to 100 mg once a day.   Previous trials of Zoloft 150 mg and Prozac 10 mg stopped due to inefficacy.   Past Medical History:  Past Medical History:  Diagnosis Date   Anxiety    Bipolar 2 disorder ([place])    Depression    Dysmenorrhea    Hypertriglyceridemia 2020   MDD (major depressive disorder) 06/19/2017   Ovarian torsion 2008    Past Surgical History:  Procedure Laterality Date   COLONOSCOPY  08/2020   polyp, int  hem, benign biopsies ([place])   ESOPHAGOGASTRODUODENOSCOPY  08/2020   LA grade A reflux esophagitis and antral gastritis, H pylori neg, treated with PPI BID and carafate course ([place]  06/30/2007   dx laparoscopy, reduction of right adnexal torsion, bilateral oophoropexy of each ovary to posterior uterine fundus    Family Psychiatric History: As mentioned in initial H&P, reviewed today, no change Family History:  Family History  Problem Relation Age of Onset   Hepatitis B Mother    Anxiety disorder Mother    Hypertension Father    ADD / ADHD Brother    Ovarian cancer Paternal Grandmother     Social History:  Social History   Socioeconomic History   Marital status: Married    Spouse name: Not on file   Number of children: 0   Years of education: Not on file   Highest education level: Some college, no degree  Occupational History   Occupation: [employer]    Comment: full time  Tobacco Use   Smoking status: Never   Smokeless tobacco: Never  Vaping Use   Vaping Use: Former  Substance and Sexual Activity   Alcohol use: Yes    Comment: twice a month (5-7 drinks)   Drug use: Yes    Types: Marijuana    Comment: last use 16 Aug 2020   Sexual activity: Yes  Other Topics Concern   Not on file  Social History Narrative          Social Determinants of Health   Financial Resource Strain: Low Risk  (08/26/2017)   Overall Financial Resource Strain (CARDIA)    Difficulty of Paying Living Expenses: Not hard at all  Food Insecurity: No Food Insecurity (08/26/2017)   Hunger Vital Sign    Worried About Running Out of Food in the Last Year: Never true    Ran Out of Food in the Last Year: Never true  Transportation Needs: No Transportation Needs (08/26/2017)   PRAPARE - Hydrologist (Medical): No    Lack of Transportation (Non-Medical): No  Physical Activity: Sufficiently Active (08/26/2017)   Exercise Vital Sign     Days of Exercise per Week: 5 days    Minutes of Exercise per Session: 60 min  Stress: No Stress Concern Present (08/26/2017)   [place]    Feeling of Stress : Not at all  Social Connections: Moderately Isolated (08/26/2017)   Social Connection and Isolation Panel [NHANES]    Frequency of Communication with Friends and Family: Three times a week    Frequency of Social Gatherings with Friends and Family: More than three times a week    Attends Religious Services: Never    Marine scientist or Organizations: No  Attends Archivist Meetings: Never    Marital Status: Never married    Allergies: No Known Allergies  Metabolic Disorder Labs: Lab Results  Component Value Date   HGBA1C 5.0 07/26/2020   MPG 97 07/26/2020   MPG 94 05/14/2017   No results found for: "PROLACTIN" Lab Results  Component Value Date   CHOL 226 (H) 07/26/2020   TRIG 138 07/26/2020   HDL 44 07/26/2020   CHOLHDL 5.1 07/26/2020   VLDL 28 07/26/2020   LDLCALC 154 (H) 07/26/2020   LDLCALC 103 (H) 04/05/2020   Lab Results  Component Value Date   TSH 2.70 09/12/2021   TSH 2.401 07/26/2020    Therapeutic Level Labs: No results found for: "LITHIUM" No results found for: "VALPROATE" No results found for: "CBMZ"  Current Medications: Current Outpatient Medications  Medication Sig Dispense Refill   ARIPiprazole (ABILIFY) 10 MG tablet Take 1 tablet (10 mg total) by mouth at bedtime. 90 tablet 1   fluvoxaMINE (LUVOX) 100 MG tablet TAKE 2 TABLETS BY MOUTH AT BEDTIME. 180 tablet 1   lamoTRIgine (LAMICTAL) 100 MG tablet Take 1.5 tablets (150 mg total) by mouth at bedtime. 135 tablet 1   Levonorgestrel (KYLEENA IU) by Intrauterine route.     mupirocin cream (BACTROBAN) 2 % Apply 1 application. topically 2 (two) times daily. 15 g 0   traZODone (DESYREL) 100 MG tablet Take 1.5 tablets (150 mg total) by mouth at bedtime. 135 tablet 1    No current facility-administered medications for this visit.     Musculoskeletal: Strength & Muscle Tone: unable to assess since visit was over the telemedicine.  Gait & Station: unable to assess since visit was over the telemedicine.  Patient leans: N/A  Psychiatric Specialty Exam: ROSReview of 12 systems negative except as mentioned in HPI   There were no vitals taken for this visit.There is no height or weight on file to calculate BMI.  General Appearance: Casual and Well Groomed  Eye Contact:  Good  Speech:  Clear and Coherent and Normal Rate  Volume:  Normal  Mood: "good..."  Affect:  Appropriate, Congruent, and Full Range  Thought Process:  Goal Directed and Linear  Orientation:  Full (Time, Place, and Person)  Thought Content: Logical   Suicidal Thoughts:  No  Homicidal Thoughts:  No  Memory:  Immediate;   Fair Recent;   Fair Remote;   Fair  Judgement:  Good  Insight:  Good  Psychomotor Activity:  Normal  Concentration:  Concentration: Good and Attention Span: Good  Recall:  Good  Fund of Knowledge: Good  Language: Good  Akathisia:  No    AIMS (if indicated): not done  Assets:  Communication Skills Desire for Improvement Financial Resources/Insurance Housing Leisure Time Physical Health Social Support Transportation Vocational/Educational  ADL's:  Intact  Cognition: WNL  Sleep:  Good     Screenings: AIMS    Flowsheet Row Admission (Discharged) from 05/13/2017 in [place] Total Score 0      AUDIT    Flowsheet Row Admission (Discharged) from 07/27/2020 in [place] [facility] Admission (Discharged) from 05/13/2017 in [place]  Alcohol Use Disorder Identification Test Final Score (AUDIT) 6 0      GAD-7    Flowsheet Row Counselor from 07/30/2022 in [place] Counselor from 10/04/2021 in [place]  Office Visit from 04/05/2020 in [place] [facility]  Total GAD-7 Score 11 15  [place] Counselor from 07/30/2022 in [place] Counselor from 10/04/2021 in [place] Office Visit from 06/15/2021 in [place] Counselor from 05/01/2021 in [place] from 03/23/2021 in [place]  PHQ-2 Total Score '2 3 1 '$ 0 0  PHQ-9 Total Score 5 17 -- -- --      Flowsheet Row Counselor from 07/30/2022 in [place] ED from 02/12/2022 in [facility]  ED from 12/12/2021 in [facility] [place] No Risk No Risk No Risk        Assessment and Plan:   Maydell is a 26 yr old Caucasian female who is single, employed, has a history of bipolar disorder, generalized anxiety disorder, insomnia evaluated today for a follow-up visit for medication management.   She appears to have continued stability with mood, anxiety and overall seems to be doing well.  Recommending to continue with current medications and therapy.  She will follow back in 2 months or earlier if needed.  She was also reminded to get her blood work done.   Plan as below.  Reviewed on 08/15/2022  Plan  Bipolar 2 disorder (chronic, stable) Continue with lamotrigine 150 mg daily.  Continue Luvox  200 mg p.o. daily Continue Abilify at '10mg'$  po qd.   For generalized anxiety disorder-(chronic and stable) -  ind therapy at [facility],  Luvox as mentioned above.  Medications above will be helpful in decreasing anxiety. Previously tried Prozac upto 10 mg daily and Zoloft 150 mg daily - stopped due to inefficacy.    For insomnia-chronic and stable Continue trazodone '150mg'$  p.o. nightly Can take Atarax 25-50 mg QHS PRN for sleep.   Labs including CBC, CMP,  TSH were ordered by PCP and appears stable. She is yet to do lipid panel, hemoglobin A1c, vitamin D and B12 levels were ordered , she has not done yet.   This note was generated in part or whole with voice recognition software. Voice recognition is usually quite accurate but there are transcription errors that can and very often do occur. I apologize for any typographical errors that were not detected and corrected.    MDM = 2 or more chronic stable conditions + med management  Victoria Erm, MD 08/15/2022, 9:34 AM

## 2022-09-05 ENCOUNTER — Other Ambulatory Visit (HOSPITAL_COMMUNITY): Payer: Self-pay

## 2022-09-11 ENCOUNTER — Ambulatory Visit (INDEPENDENT_AMBULATORY_CARE_PROVIDER_SITE_OTHER): Payer: Self-pay | Admitting: Licensed Clinical Social Worker

## 2022-09-11 DIAGNOSIS — F411 Generalized anxiety disorder: Secondary | ICD-10-CM

## 2022-09-11 DIAGNOSIS — F3181 Bipolar II disorder: Secondary | ICD-10-CM

## 2022-09-11 NOTE — Progress Notes (Signed)
Virtual Visit via Video Note  I connected with Victoria Simmons on 09/11/22 at  8:00 AM EST by a video enabled telemedicine application and verified that I am speaking with the correct person using two identifiers.  Location: Patient: home Provider: remote office [place], [place])   I discussed the limitations of evaluation and management by telemedicine and the availability of in person appointments. The patient expressed understanding and agreed to proceed.   I discussed the assessment and treatment plan with the patient. The patient was provided an opportunity to ask questions and all were answered. The patient agreed with the plan and demonstrated an understanding of the instructions.   The patient was advised to call back or seek an in-person evaluation if the symptoms worsen or if the condition fails to improve as anticipated.  I provided 35 minutes of non-face-to-face time during this encounter.   [place], LCSW  THERAPIST PROGRESS NOTE  Session Time: [id number]  Participation Level: Active  Behavioral Response: Neat and Well GroomedAlertAnxious  Type of Therapy: Individual Therapy  Treatment Goals addressed: Problem: Alleviate depressive/manic symptoms and return to improved levels of effective functioning. Goal: LTG: Stabilize mood and increase goal-directed behavior: per pt self report Outcome: Progressing Goal: STG: [place] WILL ATTEND AT LEAST 80% OF SCHEDULED FOLLOW-UP OPT APPOINTMENTS Outcome: Progressing  Interventions: CBT, trauma focused   Summary: Victoria Simmons is a 26 y.o. female who presents with symptoms consistent with bipolar II diagnosis.   Patient reports that she feels her overall mood is stable. Patient reports that she has had some situational anxiety and stress, and patient reports that she is employing her coping skills to manage them. Patient reports that her work environment is a very positive environment, and patient reports that she is  socially engaging with her colleagues at work, which is a positive thing for her. Patient denies any issues or problems in her current relationships--both romantic, and with her family members.  Patient was allowed an opportunity to explore and express any concerns, but patient reports that she does not have any immediate concerns at this time period patient does report that she is compliant with her medications, engaging in self-care behaviors, feels in control of her emotions and feelings, and feels like her communication skills have improved.  Continued recommendations are as follows: self care behaviors, positive social engagements, focusing on overall work/home/life balance, and focusing on positive physical and emotional wellness.   Suicidal/Homicidal: No  Therapist Response: Pt is continuing to apply interventions learned in session into daily life situations. Pt is currently on track to meet goals utilizing interventions mentioned above. Personal growth and progress noted. Treatment to continue as indicated.   Plan: Return again in 2 months  Diagnosis:  Encounter Diagnoses  Name Primary?   Bipolar 2 disorder, major depressive episode ([place]) Yes   GAD (generalized anxiety disorder)     Collaboration of Care: Other pt encouraged to continue care with psychiatrist of record, Dr. Leotis Shames.  Patient/Guardian was advised Release of Information must be obtained prior to any record release in order to collaborate their care with an outside provider. Patient/Guardian was advised if they have not already done so to contact the registration department to sign all necessary forms in order for [name] to release information regarding their care.   Consent: Patient/Guardian gives verbal consent for treatment and assignment of benefits for services provided during this visit. Patient/Guardian expressed understanding and agreed to proceed.    [place], LCSW 09/11/2022

## 2022-09-14 ENCOUNTER — Telehealth (INDEPENDENT_AMBULATORY_CARE_PROVIDER_SITE_OTHER): Payer: Self-pay | Admitting: Family Medicine

## 2022-09-14 ENCOUNTER — Encounter: Payer: Self-pay | Admitting: Family Medicine

## 2022-09-14 VITALS — Temp 98.9°F | Ht 65.0 in | Wt 195.0 lb

## 2022-09-14 DIAGNOSIS — R55 Syncope and collapse: Secondary | ICD-10-CM

## 2022-09-14 DIAGNOSIS — R112 Nausea with vomiting, unspecified: Secondary | ICD-10-CM

## 2022-09-14 DIAGNOSIS — R197 Diarrhea, unspecified: Secondary | ICD-10-CM

## 2022-09-14 NOTE — Progress Notes (Signed)
Patient ID: Victoria Simmons, female    DOB: 1996-05-07, 26 y.o.   MRN: [id number]  Virtual visit completed through [place], a video enabled telemedicine application. Due to national recommendations of social distancing due to COVID-19, a virtual visit is felt to be most appropriate for this patient at this time. Reviewed limitations, risks, security and privacy concerns of performing a virtual visit and the availability of in person appointments. I also reviewed that there may be a patient responsible charge related to this service. The patient agreed to proceed.   Patient location: home Provider location: [place] [facility], office Persons participating in this virtual visit: patient, provider   If any vitals were documented, they were collected by patient at home unless specified below.    Temp 98.9 F (37.2 C)   Ht '5\' 5"'$  (1.651 m)   Wt 195 lb (88.5 kg)   BMI 32.45 kg/m    CC: vomiting and diarrhea  Subjective:   HPI: Victoria Simmons is a 26 y.o. female presenting on 09/14/2022 for GI Problem (C/o vomiting, diarrhea, HA, fever- max 100, dizziness and passed out. Sxs started 09/09/22. )   For 4 days this week had frequent vomiting, diarrhea, progressing to lightheadedness, dizziness, and syncopal episode once during this time. Over last 2-3 days, GI symptoms are improving but notes persistent dizziness, fatigue and new bad frontal headache. Also notes persistent congestion, shortness of breath and chest tightness over the past 24 hours. Low grade fever to 100.   No blood in stool, no blood in emesis.  Difficulty keeping solid foods down, but has been able to tolerate medications and fluids well.   Episode of syncope happened 2 days ago in the morning when she sat up to get out of bed - thinks she passed out and landed back on her bed, woke up a short time later. Denies head injury.   Dad has been sick but with different symptoms.   She lives in [place], 30+ minutes away from any  ER.      Relevant past medical, surgical, family and social history reviewed and updated as indicated. Interim medical history since our last visit reviewed. Allergies and medications reviewed and updated. Outpatient Medications Prior to Visit  Medication Sig Dispense Refill   ARIPiprazole (ABILIFY) 10 MG tablet Take 1 tablet (10 mg total) by mouth at bedtime. 90 tablet 1   fluvoxaMINE (LUVOX) 100 MG tablet TAKE 2 TABLETS BY MOUTH AT BEDTIME. 180 tablet 1   lamoTRIgine (LAMICTAL) 100 MG tablet Take 1.5 tablets (150 mg total) by mouth at bedtime. 135 tablet 1   Levonorgestrel (KYLEENA IU) by Intrauterine route.     mupirocin cream (BACTROBAN) 2 % Apply 1 application. topically 2 (two) times daily. 15 g 0   traZODone (DESYREL) 100 MG tablet Take 1.5 tablets (150 mg total) by mouth at bedtime. 135 tablet 1   No facility-administered medications prior to visit.     Per HPI unless specifically indicated in ROS section below Review of Systems Objective:  Temp 98.9 F (37.2 C)   Ht '5\' 5"'$  (1.651 m)   Wt 195 lb (88.5 kg)   BMI 32.45 kg/m   Wt Readings from Last 3 Encounters:  09/14/22 195 lb (88.5 kg)  03/14/22 195 lb 2 oz (88.5 kg)  12/12/21 195 lb (88.5 kg)       Physical exam: Gen: alert, tired, ill appearing Pulm: speaks in complete sentences without increased work of breathing Psych: normal mood,  normal thought content      Results for orders placed or performed in visit on 09/12/21  TSH  Result Value Ref Range   TSH 2.70 0.35 - 5.50 uIU/mL  Comprehensive metabolic panel  Result Value Ref Range   Sodium 138 135 - 145 mEq/L   Potassium 4.3 3.5 - 5.1 mEq/L   Chloride 104 96 - 112 mEq/L   CO2 28 19 - 32 mEq/L   Glucose, Bld 87 70 - 99 mg/dL   BUN 13 6 - 23 mg/dL   Creatinine, Ser 0.96 0.40 - 1.20 mg/dL   Total Bilirubin 0.4 0.2 - 1.2 mg/dL   Alkaline Phosphatase 63 39 - 117 U/L   AST 12 0 - 37 U/L   ALT 11 0 - 35 U/L   Total Protein 7.4 6.0 - 8.3 g/dL   Albumin 4.4  3.5 - 5.2 g/dL   GFR 82.49 >60.00 mL/min   Calcium 9.5 8.4 - 10.5 mg/dL  CBC with Differential/Platelet  Result Value Ref Range   WBC 7.0 4.0 - 10.5 K/uL   RBC 4.84 3.87 - 5.11 Mil/uL   Hemoglobin 13.6 12.0 - 15.0 g/dL   HCT 40.7 36.0 - 46.0 %   MCV 84.1 78.0 - 100.0 fl   MCHC 33.4 30.0 - 36.0 g/dL   RDW 14.4 11.5 - 15.5 %   Platelets 192.0 150.0 - 400.0 K/uL   Neutrophils Relative % 61.0 43.0 - 77.0 %   Lymphocytes Relative 30.2 12.0 - 46.0 %   Monocytes Relative 5.9 3.0 - 12.0 %   Eosinophils Relative 1.4 0.0 - 5.0 %   Basophils Relative 1.5 0.0 - 3.0 %   Neutro Abs 4.3 1.4 - 7.7 K/uL   Lymphs Abs 2.1 0.7 - 4.0 K/uL   Monocytes Absolute 0.4 0.1 - 1.0 K/uL   Eosinophils Absolute 0.1 0.0 - 0.7 K/uL   Basophils Absolute 0.1 0.0 - 0.1 K/uL  Celiac Pnl 2 rflx Endomysial Ab Ttr  Result Value Ref Range   Gliadin IgG 1.1 U/mL   Gliadin IgA 5.5 U/mL   (tTG) Ab, IgG <1.0 U/mL   (tTG) Ab, IgA <1.0 U/mL   Endomysial Ab IgA NEGATIVE NEGATIVE   Endomysial Titer CANCELED    Immunoglobulin A 271 47 - 310 mg/dL  Lipase  Result Value Ref Range   Lipase 10.0 (L) 11.0 - 59.0 U/L  Sedimentation rate  Result Value Ref Range   Sed Rate 12 0 - 20 mm/hr   Assessment & Plan:   Problem List Items Addressed This Visit       Unprioritized   Nausea vomiting and diarrhea - Primary    Symptoms exceed what we can safely treat virtually today. Given concerning symptoms of ongoing orthostatic dizziness, recent syncope, and inability to tolerate PO, along with new symptoms of chest tightness and shortness of breath, I did recommend she seek care at ER as she is likely dehydrated and would benefit from IVF. I offered to call 911 - she declines and states she will call her mother to drive her to ER for evaluation today.       Syncope    ?orthostatic vs vasovagal vs other.  Recommend further evaluation in person today  She will go to ER.         No orders of the defined types were placed in this  encounter.  No orders of the defined types were placed in this encounter.   I discussed the assessment and treatment plan with  the patient. The patient was provided an opportunity to ask questions and all were answered. The patient agreed with the plan and demonstrated an understanding of the instructions. The patient was advised to call back or seek an in-person evaluation if the symptoms worsen or if the condition fails to improve as anticipated.  Follow up plan: No follow-ups on file.  Ria Bush, MD

## 2022-09-14 NOTE — Assessment & Plan Note (Signed)
?  orthostatic vs vasovagal vs other.  Recommend further evaluation in person today  She will go to ER.

## 2022-09-14 NOTE — Assessment & Plan Note (Addendum)
Symptoms exceed what we can safely treat virtually today. Given concerning symptoms of ongoing orthostatic dizziness, recent syncope, and inability to tolerate PO, along with new symptoms of chest tightness and shortness of breath, I did recommend she seek care at ER as she is likely dehydrated and would benefit from IVF. I offered to call 911 - she declines and states she will call her mother to drive her to ER for evaluation today.

## 2022-09-29 ENCOUNTER — Emergency Department (HOSPITAL_COMMUNITY)
Admission: EM | Admit: 2022-09-29 | Discharge: 2022-09-29 | Disposition: A | Discharge status: Home / Self Care | Payer: Medicaid Other | Attending: Emergency Medicine | Admitting: Emergency Medicine

## 2022-09-29 ENCOUNTER — Other Ambulatory Visit: Payer: Self-pay

## 2022-09-29 ENCOUNTER — Emergency Department (HOSPITAL_COMMUNITY): Payer: Medicaid Other

## 2022-09-29 DIAGNOSIS — K920 Hematemesis: Secondary | ICD-10-CM | POA: Insufficient documentation

## 2022-09-29 DIAGNOSIS — R1010 Upper abdominal pain, unspecified: Secondary | ICD-10-CM | POA: Diagnosis not present

## 2022-09-29 DIAGNOSIS — R112 Nausea with vomiting, unspecified: Secondary | ICD-10-CM | POA: Diagnosis not present

## 2022-09-29 LAB — CBC WITH DIFFERENTIAL/PLATELET
Abs Immature Granulocytes: 0.04 10*3/uL (ref 0.00–0.07)
Basophils Absolute: 0.1 10*3/uL (ref 0.0–0.1)
Basophils Relative: 1 %
Eosinophils Absolute: 0.1 10*3/uL (ref 0.0–0.5)
Eosinophils Relative: 1 %
HCT: 44.1 % (ref 36.0–46.0)
Hemoglobin: 14.7 g/dL (ref 12.0–15.0)
Immature Granulocytes: 1 %
Lymphocytes Relative: 20 %
Lymphs Abs: 1.6 10*3/uL (ref 0.7–4.0)
MCH: 28.8 pg (ref 26.0–34.0)
MCHC: 33.3 g/dL (ref 30.0–36.0)
MCV: 86.3 fL (ref 80.0–100.0)
Monocytes Absolute: 0.5 10*3/uL (ref 0.1–1.0)
Monocytes Relative: 6 %
Neutro Abs: 6.1 10*3/uL (ref 1.7–7.7)
Neutrophils Relative %: 71 %
Platelets: 252 10*3/uL (ref 150–400)
RBC: 5.11 MIL/uL (ref 3.87–5.11)
RDW: 12.6 % (ref 11.5–15.5)
WBC: 8.3 10*3/uL (ref 4.0–10.5)
nRBC: 0 % (ref 0.0–0.2)

## 2022-09-29 LAB — COMPREHENSIVE METABOLIC PANEL
ALT: 19 U/L (ref 0–44)
AST: 16 U/L (ref 15–41)
Albumin: 4.4 g/dL (ref 3.5–5.0)
Alkaline Phosphatase: 56 U/L (ref 38–126)
Anion gap: 9 (ref 5–15)
BUN: 9 mg/dL (ref 6–20)
CO2: 23 mmol/L (ref 22–32)
Calcium: 9.3 mg/dL (ref 8.9–10.3)
Chloride: 108 mmol/L (ref 98–111)
Creatinine, Ser: 1.05 mg/dL — ABNORMAL HIGH (ref 0.44–1.00)
GFR, Estimated: >60 mL/min (ref >60)
Glucose, Bld: 96 mg/dL (ref 70–99)
Potassium: 3.8 mmol/L (ref 3.5–5.1)
Sodium: 140 mmol/L (ref 135–145)
Total Bilirubin: 0.7 mg/dL (ref 0.3–1.2)
Total Protein: 7.9 g/dL (ref 6.5–8.1)

## 2022-09-29 LAB — URINALYSIS, ROUTINE W REFLEX MICROSCOPIC
Bacteria, UA: NONE SEEN
Bilirubin Urine: NEGATIVE
Glucose, UA: NEGATIVE mg/dL
Hgb urine dipstick: NEGATIVE
Ketones, ur: 5 mg/dL — AB
Leukocytes,Ua: NEGATIVE
Nitrite: NEGATIVE
Protein, ur: 100 mg/dL — AB
Specific Gravity, Urine: 1.03 (ref 1.005–1.030)
pH: 5 (ref 5.0–8.0)

## 2022-09-29 LAB — PREGNANCY, URINE: Preg Test, Ur: NEGATIVE

## 2022-09-29 LAB — POC OCCULT BLOOD, ED: Fecal Occult Bld: NEGATIVE

## 2022-09-29 LAB — LIPASE, BLOOD: Lipase: 27 U/L (ref 11–51)

## 2022-09-29 MED ORDER — MORPHINE SULFATE (PF) 4 MG/ML IV SOLN
4.0000 mg | Freq: Once | INTRAVENOUS | Status: AC
  Administered 2022-09-29: 4 mg via INTRAVENOUS
  Filled 2022-09-29: qty 1

## 2022-09-29 MED ORDER — IOHEXOL 300 MG/ML  SOLN
100.0000 mL | Freq: Once | INTRAMUSCULAR | Status: AC | PRN
  Administered 2022-09-29: 100 mL via INTRAVENOUS

## 2022-09-29 MED ORDER — SODIUM CHLORIDE 0.9 % IV BOLUS
1000.0000 mL | Freq: Once | INTRAVENOUS | Status: AC
  Administered 2022-09-29: 1000 mL via INTRAVENOUS

## 2022-09-29 MED ORDER — ONDANSETRON 4 MG PO TBDP
4.0000 mg | ORAL_TABLET | Freq: Three times a day (TID) | ORAL | 0 refills | Status: DC | PRN
  Filled 2022-09-29: qty 18, 6d supply, fill #0

## 2022-09-29 MED ORDER — ONDANSETRON 4 MG PO TBDP
4.0000 mg | ORAL_TABLET | Freq: Once | ORAL | Status: AC
  Administered 2022-09-29: 4 mg via ORAL
  Filled 2022-09-29: qty 1

## 2022-09-29 NOTE — ED Triage Notes (Addendum)
Pt reports rectal bleeding and "throwing up blood" x 1 week. Pt also reports diffuse abd pain

## 2022-09-29 NOTE — ED Provider Notes (Signed)
[facility] DEPT Provider Note   CSN: [id number] Arrival date & time: 09/29/22  0919     History  Chief Complaint  Patient presents with   Rectal Bleeding   [place] Victoria Simmons is a 26 y.o. female with a past medical history of anxiety, ovarian torsion, hypertriglyceridemia presenting to the Emergency Department for evaluation of abdominal pain, diarrhea and vomiting.  Patient complains of abdominal pain, bloody stool and bloody emesis in the last 2 weeks.  Pain is all across her abdomen, mostly in upper abdomen.  She reports decreased appetite.  No fever, chest pain, shortness of breath, urinary symptoms.  Denies smoking or drinking alcohol.   Rectal Bleeding   Past Medical History:  Diagnosis Date   Anxiety    Bipolar 2 disorder ([place])    Depression    Dysmenorrhea    Hypertriglyceridemia 2020   MDD (major depressive disorder) 06/19/2017   Ovarian torsion 2008   Past Surgical History:  Procedure Laterality Date   COLONOSCOPY  08/2020   polyp, int hem, benign biopsies ([facility])   ESOPHAGOGASTRODUODENOSCOPY  08/2020   LA grade A reflux esophagitis and antral gastritis, H pylori neg, treated with PPI BID and carafate course ([place]  06/30/2007   dx laparoscopy, reduction of right adnexal torsion, bilateral oophoropexy of each ovary to posterior uterine fundus     Home Medications Prior to Admission medications   Medication Sig Start Date End Date Taking? Authorizing Provider  ondansetron (ZOFRAN-ODT) 4 MG disintegrating tablet Take 1 tablet (4 mg total) by mouth every 8 (eight) hours as needed for nausea or vomiting. 09/29/22  Yes Rex Kras, PA  ARIPiprazole (ABILIFY) 10 MG tablet Take 1 tablet (10 mg total) by mouth at bedtime. 06/04/22   Orlene Erm, MD  fluvoxaMINE (LUVOX) 100 MG tablet TAKE 2 TABLETS BY MOUTH AT BEDTIME. 06/04/22   Orlene Erm, MD  lamoTRIgine (LAMICTAL) 100 MG tablet Take 1.5 tablets (150 mg  total) by mouth at bedtime. 08/15/22   Orlene Erm, MD  Levonorgestrel (KYLEENA IU) by Intrauterine route. 02/21/18 02/22/23  [provider]  mupirocin cream (BACTROBAN) 2 % Apply 1 application. topically 2 (two) times daily. 03/14/22   Ria Bush, MD  traZODone (DESYREL) 100 MG tablet Take 1.5 tablets (150 mg total) by mouth at bedtime. 06/04/22   Orlene Erm, MD  dicyclomine (BENTYL) 10 MG capsule Take 1 capsule (10 mg total) by mouth 4 (four) times daily as needed for spasms. Patient not taking: Reported on 07/26/2020 07/08/20 07/26/20  Noralyn Pick, NP      Allergies    Patient has no known allergies.    Review of Systems   Review of Systems  Gastrointestinal:  Positive for hematochezia.    Physical Exam Updated Vital Signs BP 104/89   Pulse 78   Temp 98.3 F (36.8 C) (Oral)   Resp 16   Ht '5\' 5"'$  (1.651 m)   Wt 90.7 kg   SpO2 98%   BMI 33.28 kg/m  Physical Exam Vitals and nursing note reviewed.  Constitutional:      Appearance: Normal appearance.  HENT:     Head: Normocephalic and atraumatic.     Mouth/Throat:     Mouth: Mucous membranes are moist.  Eyes:     General: No scleral icterus. Cardiovascular:     Rate and Rhythm: Normal rate and regular rhythm.     Pulses: Normal pulses.     Heart sounds:  Normal heart sounds.  Pulmonary:     Effort: Pulmonary effort is normal.     Breath sounds: Normal breath sounds.  Abdominal:     General: Abdomen is flat.     Palpations: Abdomen is soft.     Tenderness: There is no abdominal tenderness.     Comments: Chaperone was in plain view of my exam for the full duration. Normal external anus and normal tone. No palpable masses, normal mucosa, brown stool.  Musculoskeletal:        General: No deformity.     Comments: Tenderness to palpation to upper abdomen.  Skin:    General: Skin is warm.     Findings: No rash.  Neurological:     General: No focal deficit present.     Mental Status:  She is alert.  Psychiatric:        Mood and Affect: Mood normal.    ED Results / Procedures / Treatments   Labs (all labs ordered are listed, but only abnormal results are displayed) Labs Reviewed  COMPREHENSIVE METABOLIC PANEL - Abnormal; Notable for the following components:      Result Value   Creatinine, Ser 1.05 (*)    All other components within normal limits  URINALYSIS, ROUTINE W REFLEX MICROSCOPIC - Abnormal; Notable for the following components:   Color, Urine AMBER (*)    APPearance TURBID (*)    Ketones, ur 5 (*)    Protein, ur 100 (*)    All other components within normal limits  CBC WITH DIFFERENTIAL/PLATELET  LIPASE, BLOOD  PREGNANCY, URINE  POC OCCULT BLOOD, ED    EKG None  Radiology CT ABDOMEN PELVIS W CONTRAST  Result Date: 09/29/2022 CLINICAL DATA:  Abdominal pain.  Rectal bleeding. EXAM: CT ABDOMEN AND PELVIS WITH CONTRAST TECHNIQUE: Multidetector CT imaging of the abdomen and pelvis was performed using the standard protocol following bolus administration of intravenous contrast. RADIATION DOSE REDUCTION: This exam was performed according to the departmental dose-optimization program which includes automated exposure control, adjustment of the mA and/or kV according to patient size and/or use of iterative reconstruction technique. CONTRAST:  19m OMNIPAQUE IOHEXOL 300 MG/ML  SOLN COMPARISON:  09/21/2021. FINDINGS: Lower chest: Clear lung bases. Hepatobiliary: No focal liver abnormality is seen. No gallstones, gallbladder wall thickening, or biliary dilatation. Pancreas: Unremarkable. No pancreatic ductal dilatation or surrounding inflammatory changes. Spleen: Normal in size without focal abnormality. Adrenals/Urinary Tract: Adrenal glands are unremarkable. Kidneys are normal, without renal calculi, focal lesion, or hydronephrosis. Bladder is unremarkable. Stomach/Bowel: Stomach is within normal limits. Appendix appears normal. No evidence of bowel wall thickening,  distention, or inflammatory changes. Vascular/Lymphatic: No significant vascular findings are present. No enlarged abdominal or pelvic lymph nodes. Reproductive: Well-positioned IUD. Uterus otherwise unremarkable. No adnexal masses. Other: No abdominal wall hernia or abnormality. No abdominopelvic ascites. Musculoskeletal: No acute or significant osseous findings. IMPRESSION: Normal enhanced CT scan of the abdomen and pelvis. No findings to account the patient's rectal bleeding or abdominal pain. Electronically Signed   By: DLajean ManesM.D.   On: 09/29/2022 14:53    Procedures Procedures    Medications Ordered in ED Medications  ondansetron (ZOFRAN-ODT) disintegrating tablet 4 mg (4 mg Oral Given 09/29/22 1501)  sodium chloride 0.9 % bolus 1,000 mL (1,000 mLs Intravenous New Bag/Given 09/29/22 1408)  iohexol (OMNIPAQUE) 300 MG/ML solution 100 mL (100 mLs Intravenous Contrast Given 09/29/22 1434)  morphine (PF) 4 MG/ML injection 4 mg (4 mg Intravenous Given 09/29/22 1446)    ED  Course/ Medical Decision Making/ A&P                           Medical Decision Making Amount and/or Complexity of Data Reviewed Labs: ordered. Radiology: ordered.  Risk Prescription drug management.   This patient presents to the ED for abdominal pain, diarrhea, vomiting, this involves an extensive number of treatment options, and is a complaint that carries with a high risk of complications and morbidity.  The differential diagnosis includes small bowel obstruction, acute gastroenteritis, appendicitis, gallbladder/biliary, pancreatitis, peptic ulcer disease, perforation, EtOH intoxication, cannabinoid hyperemesis.  This is not an exhaustive list.  Lab tests: I ordered and personally interpreted labs.  The pertinent results include: WBC unremarkable. Hbg unremarkable. Platelets unremarkable. No electrolyte abnormalities noted.  BUN, creatinine elevated at 1.05. UA significant for no acute abnormality.    Imaging studies: I ordered imaging studies. I personally reviewed, interpreted imaging and agree with the radiologist's interpretations. The results include: CT abdomen and pelvis negative.  Problem list/ ED course/ Critical interventions/ Medical management: HPI: See above Vital signs within normal range and stable throughout visit. Laboratory/imaging studies significant for: See above. On physical examination, patient is afebrile and appears in no acute distress. This patient has a presentation consistent with rectal bleeding, unclear etiology. Can not rule out inflammatory bowel disorder. Presentation not consistent with other acute, emergent causes of upper or lower GI bleeding. No evidence of hemorrhagic shock. Considered but low risk for SBO (normal BM, passing flatus, no abdominal surgeries). Patient CMP with abnormal creatinine suggesting likely prerenal AKI secondary to dehydration. Low suspicion for gastric or esophageal dysmotility. Patient with no chest pain, unremarkable EKG so low suspicion for ACS. Based on history, exam, and work up low suspicion for pancreatitis, appendicitis, biliary pathology, or other emergent problem. Patient given zofran and tolerated PO here. Patient to be discharged with zofran ODT and to follow up with PCP.  Advised patient to return to ER if new or worsening symptoms.  I have reviewed the patient home medicines and have made adjustments as needed.  Cardiac monitoring/EKG: The patient was maintained on a cardiac monitor.  I personally reviewed and interpreted the cardiac monitor which showed an underlying rhythm of: sinus rhythm.  Additional history obtained: External records from outside source obtained and reviewed including: Chart review including previous notes, labs, imaging.  Consultations obtained:  Disposition Continued outpatient therapy. Follow-up with PCP recommended for reevaluation of symptoms. Treatment plan discussed with patient.  Pt  acknowledged understanding was agreeable to the plan. Worrisome signs and symptoms were discussed with patient, and patient acknowledged understanding to return to the ED if they noticed these signs and symptoms. Patient was stable upon discharge.   This chart was dictated using voice recognition software.  Despite best efforts to proofread,  errors can occur which can change the documentation meaning.          Final Clinical Impression(s) / ED Diagnoses Final diagnoses:  Pain of upper abdomen  Hematemesis with nausea    Rx / DC Orders ED Discharge Orders          Ordered    ondansetron (ZOFRAN-ODT) 4 MG disintegrating tablet  Every 8 hours PRN        09/29/22 1538              Rex Kras, PA 09/29/22 1801    Deno Etienne, DO 09/29/22 1826

## 2022-09-29 NOTE — ED Provider Triage Note (Signed)
Emergency Medicine Provider Triage Evaluation Note  EMMAJANE ALTAMURA , a 26 y.o. female  was evaluated in triage.  Pt complains of abdominal pain, bloody loose stool and bloody emesis for 2 weeks.  Pain is all across her abdomen, mostly on the upper abdomen.  She reports decreased appetite.  No fever, chest pain, shortness of breath, urinary symptoms.  Denies smoking.  Occasional drinking.  Review of Systems  Positive: As above Negative: As above  Physical Exam  BP 103/85 (BP Location: Right Arm)   Pulse 92   Temp 98.1 F (36.7 C) (Oral)   Resp 16   Ht '5\' 5"'$  (1.651 m)   Wt 90.7 kg   SpO2 97%   BMI 33.28 kg/m  Gen:   Awake, no distress   Resp:  Normal effort  MSK:   Moves extremities without difficulty  Other:  TTP to upper abdomen  Medical Decision Making  Medically screening exam initiated at 10:11 AM.  Appropriate orders placed.  Marivel KATELEE SCHUPP was informed that the remainder of the evaluation will be completed by another provider, this initial triage assessment does not replace that evaluation, and the importance of remaining in the ED until their evaluation is complete.     Rex Kras, [place] 09/29/22 1013

## 2022-09-29 NOTE — Discharge Instructions (Addendum)
Please take your medications as prescribed. Please take tylenol/ibuprofen for pain. I recommend close follow-up with PCP for reevaluation.  Please do not hesitate to return to emergency department if worrisome signs symptoms we discussed become apparent.

## 2022-10-02 ENCOUNTER — Other Ambulatory Visit (HOSPITAL_COMMUNITY): Payer: Self-pay

## 2022-10-10 ENCOUNTER — Other Ambulatory Visit: Payer: Self-pay

## 2022-10-10 MED FILL — Aripiprazole Tab 10 MG: ORAL | 90 days supply | Qty: 90 | Fill #1 | Status: CN

## 2022-10-10 MED FILL — Fluvoxamine Maleate Tab 100 MG: ORAL | 90 days supply | Qty: 180 | Fill #1 | Status: AC

## 2022-10-10 MED FILL — Trazodone HCl Tab 100 MG: ORAL | 30 days supply | Qty: 45 | Fill #1 | Status: CN

## 2022-10-12 ENCOUNTER — Other Ambulatory Visit (HOSPITAL_COMMUNITY): Payer: Self-pay

## 2022-10-12 ENCOUNTER — Other Ambulatory Visit: Payer: Self-pay

## 2022-10-12 MED FILL — Aripiprazole Tab 10 MG: ORAL | 30 days supply | Qty: 30 | Fill #1 | Status: AC

## 2022-10-12 MED FILL — Trazodone HCl Tab 100 MG: ORAL | 30 days supply | Qty: 45 | Fill #1 | Status: AC

## 2022-10-17 ENCOUNTER — Telehealth (INDEPENDENT_AMBULATORY_CARE_PROVIDER_SITE_OTHER): Payer: Medicaid Other | Admitting: Child and Adolescent Psychiatry

## 2022-10-17 ENCOUNTER — Other Ambulatory Visit (HOSPITAL_COMMUNITY): Payer: Self-pay

## 2022-10-17 DIAGNOSIS — Z79899 Other long term (current) drug therapy: Secondary | ICD-10-CM

## 2022-10-17 DIAGNOSIS — F3181 Bipolar II disorder: Secondary | ICD-10-CM | POA: Diagnosis not present

## 2022-10-17 DIAGNOSIS — F411 Generalized anxiety disorder: Secondary | ICD-10-CM | POA: Diagnosis not present

## 2022-10-17 MED ORDER — FLUVOXAMINE MALEATE 100 MG PO TABS
200.0000 mg | ORAL_TABLET | Freq: Every day | ORAL | 1 refills | Status: DC
  Filled 2022-10-17 – 2023-01-07 (×4): qty 180, 90d supply, fill #0
  Filled 2023-04-24 – 2023-04-30 (×2): qty 180, 90d supply, fill #1

## 2022-10-17 MED ORDER — TRAZODONE HCL 100 MG PO TABS
150.0000 mg | ORAL_TABLET | Freq: Every day | ORAL | 1 refills | Status: DC
  Filled 2022-10-17: qty 135, 90d supply, fill #0
  Filled 2022-11-19: qty 45, 30d supply, fill #0
  Filled 2022-12-21 – 2022-12-24 (×2): qty 45, 30d supply, fill #1
  Filled 2023-01-22: qty 45, 30d supply, fill #2
  Filled 2023-03-13: qty 45, 30d supply, fill #3

## 2022-10-17 MED ORDER — ARIPIPRAZOLE 10 MG PO TABS
10.0000 mg | ORAL_TABLET | Freq: Every day | ORAL | 1 refills | Status: DC
  Filled 2022-10-17: qty 90, 90d supply, fill #0
  Filled 2022-11-19: qty 30, 30d supply, fill #0
  Filled 2022-12-21 – 2022-12-24 (×2): qty 30, 30d supply, fill #1
  Filled 2023-01-22: qty 30, 30d supply, fill #2

## 2022-10-17 NOTE — Progress Notes (Signed)
Virtual Visit via Video Note  I connected with Victoria Simmons on 10/17/22 at 10:30 AM EST by a video enabled telemedicine application and verified that I am speaking with the correct person using two identifiers.  Location: Patient: at work Provider: office   I discussed the limitations of evaluation and management by telemedicine and the availability of in person appointments. The patient expressed understanding and agreed to proceed.    I discussed the assessment and treatment plan with the patient. The patient was provided an opportunity to ask questions and all were answered. The patient agreed with the plan and demonstrated an understanding of the instructions.   The patient was advised to call back or seek an in-person evaluation if the symptoms worsen or if the condition fails to improve as anticipated.    Victoria Erm, MD      [facility] MD/PA/NP OP Progress Note  10/17/2022 11:00 AM Victoria Simmons  MRN:  [id number]  Chief Complaint: "I am doing good..."(pt)  Synopsis: Victoria Simmons is a 27 year old Caucasian female, employed at [employer] and [employer] store in [place], with history of bipolar disorder type II, GAD, insomnia was seen by Dr. Einar Grad since 2017 and transition to this writer in April 2020.  She is currently prescribed Lamictal 150 mg once a day, Abilify 10 mg once a day, trazodone 150 mg once a day and Luvox 150 mg once a day.   HPI:   Victoria Simmons was seen and evaluated over telemedicine encounter for medication management follow-up.  She was present by herself and was evaluated alone.  She says that last week for about 3 days she was feeling depressed and was crying a lot, was feeling more anxious.  She denied any specific stressors.  But on questioning she says that she has been having some financial challenges because she has to pay rent to her parents.  She says that she has been back to her usual self, denies feeling depressed, now rates her mood around 7 or 8 out of 10, 10 being the  best mood.  She says that she has been having more anxiety, and rates her anxiety around 5 out of 10, 10 being most anxious.  Despite the anxiety she has been still able to manage her work.  She says that her work has been going well, she enjoys working, has good cliques but still it brings anxiety for her.  We discussed activities to manage these anxiety.  She says that she has been sleeping well, eating well, denies any SI or HI.  She says that last week even though she was depressed she was not having any suicidal thoughts.  She says that she notices that her mood is much better when she is exercising and therefore she has been going to gym 3 times a week and plans to stick with it.  She says that she has been smoking H[place] CBD, denies any other recreational drug use.  She says that she has been drinking about 4-5 drinks twice a month, denies any blackouts or withdrawal symptoms from it.  Psychoeducation was provided on being mindful on alcohol.  She was receptive to this.  She has been seeing her therapist about once a month and doing well with it.  She says that she has been consistently taking her medications and denies any side effects from them.  We discussed to continue with current treatment and follow back again in about 2-3 months or earlier if needed.  Visit Diagnosis:  ICD-10-CM   1. GAD (generalized anxiety disorder)  F41.1     2. Bipolar 2 disorder, major depressive episode (Victoria Simmons)  F31.81         Past Psychiatric History: PPHx reviewed today and as followin. 2 previous inpatient admission at [facility] at the age of 21, and last at [facility], no previous suicide attempts, has history of cutting and her past medication trials include Prozac and Zoloft.  Decreasing Luvox to 50 mg worsened her symptoms of anxiety therefore it was increased back to 100 mg once a day.   Previous trials of Zoloft 150 mg and Prozac 10 mg stopped due to inefficacy.   Past Medical History:  Past Medical History:   Diagnosis Date   Anxiety    Bipolar 2 disorder ([place])    Depression    Dysmenorrhea    Hypertriglyceridemia 2020   MDD (major depressive disorder) 06/19/2017   Ovarian torsion 2008    Past Surgical History:  Procedure Laterality Date   COLONOSCOPY  08/2020   polyp, int hem, benign biopsies ([facility])   ESOPHAGOGASTRODUODENOSCOPY  08/2020   LA grade A reflux esophagitis and antral gastritis, H pylori neg, treated with PPI BID and carafate course ([place]  06/30/2007   dx laparoscopy, reduction of right adnexal torsion, bilateral oophoropexy of each ovary to posterior uterine fundus    Family Psychiatric History: As mentioned in initial H&P, reviewed today, no change Family History:  Family History  Problem Relation Age of Onset   Hepatitis B Mother    Anxiety disorder Mother    Hypertension Father    ADD / ADHD Brother    Ovarian cancer Paternal Grandmother     Social History:  Social History   Socioeconomic History   Marital status: Married    Spouse name: Not on file   Number of children: 0   Years of education: Not on file   Highest education level: Some college, no degree  Occupational History   Occupation: [employer]    Comment: full time  Tobacco Use   Smoking status: Never   Smokeless tobacco: Never  Vaping Use   Vaping Use: Former  Substance and Sexual Activity   Alcohol use: Yes    Comment: twice a month (5-7 drinks)   Drug use: Yes    Types: Marijuana    Comment: last use 16 Aug 2020   Sexual activity: Yes  Other Topics Concern   Not on file  Social History Narrative          Social Determinants of Health   Financial Resource Strain: Low Risk  (08/26/2017)   Overall Financial Resource Strain (CARDIA)    Difficulty of Paying Living Expenses: Not hard at all  Food Insecurity: No Food Insecurity (08/26/2017)   Hunger Vital Sign    Worried About Running Out of Food in the Last Year: Never true    Ran Out of Food in  the Last Year: Never true  Transportation Needs: No Transportation Needs (08/26/2017)   PRAPARE - Hydrologist (Medical): No    Lack of Transportation (Non-Medical): No  Physical Activity: Sufficiently Active (08/26/2017)   Exercise Vital Sign    Days of Exercise per Week: 5 days    Minutes of Exercise per Session: 60 min  Stress: No Stress Concern Present (08/26/2017)   [place]    Feeling of Stress : Not at all  Social  Connections: Moderately Isolated (08/26/2017)   Social Connection and Isolation Panel [NHANES]    Frequency of Communication with Friends and Family: Three times a week    Frequency of Social Gatherings with Friends and Family: More than three times a week    Attends Religious Services: Never    Marine scientist or Organizations: No    Attends Music therapist: Never    Marital Status: Never married    Allergies: No Known Allergies  Metabolic Disorder Labs: Lab Results  Component Value Date   HGBA1C 5.0 07/26/2020   MPG 97 07/26/2020   MPG 94 05/14/2017   No results found for: "PROLACTIN" Lab Results  Component Value Date   CHOL 226 (H) 07/26/2020   TRIG 138 07/26/2020   HDL 44 07/26/2020   CHOLHDL 5.1 07/26/2020   VLDL 28 07/26/2020   LDLCALC 154 (H) 07/26/2020   [place] 103 (H) 04/05/2020   Lab Results  Component Value Date   TSH 2.70 09/12/2021   TSH 2.401 07/26/2020    Therapeutic Level Labs: No results found for: "LITHIUM" No results found for: "VALPROATE" No results found for: "CBMZ"  Current Medications: Current Outpatient Medications  Medication Sig Dispense Refill   ARIPiprazole (ABILIFY) 10 MG tablet Take 1 tablet (10 mg total) by mouth at bedtime. 90 tablet 1   fluvoxaMINE (LUVOX) 100 MG tablet TAKE 2 TABLETS BY MOUTH AT BEDTIME. 180 tablet 1   lamoTRIgine (LAMICTAL) 100 MG tablet Take 1.5 tablets (150 mg total) by  mouth at bedtime. 135 tablet 1   Levonorgestrel (KYLEENA IU) by Intrauterine route.     mupirocin cream (BACTROBAN) 2 % Apply 1 application. topically 2 (two) times daily. 15 g 0   ondansetron (ZOFRAN-ODT) 4 MG disintegrating tablet Take 1 tablet (4 mg total) by mouth every 8 (eight) hours as needed for nausea or vomiting. 20 tablet 0   traZODone (DESYREL) 100 MG tablet Take 1.5 tablets (150 mg total) by mouth at bedtime. 135 tablet 1   No current facility-administered medications for this visit.     Musculoskeletal: Strength & Muscle Tone: unable to assess since visit was over the telemedicine.  Gait & Station: unable to assess since visit was over the telemedicine.  Patient leans: N/A  Psychiatric Specialty Exam: ROSReview of 12 systems negative except as mentioned in HPI   There were no vitals taken for this visit.There is no height or weight on file to calculate BMI.  General Appearance: Casual and Well Groomed  Eye Contact:  Good  Speech:  Clear and Coherent and Normal Rate  Volume:  Normal  Mood: "good..."  Affect:  Appropriate, Congruent, and Full Range  Thought Process:  Goal Directed and Linear  Orientation:  Full (Time, Place, and Person)  Thought Content: Logical   Suicidal Thoughts:  No  Homicidal Thoughts:  No  Memory:  Immediate;   Fair Recent;   Fair Remote;   Fair  Judgement:  Good  Insight:  Good  Psychomotor Activity:  Normal  Concentration:  Concentration: Good and Attention Span: Good  Recall:  Good  Fund of Knowledge: Good  Language: Good  Akathisia:  No    AIMS (if indicated): not done  Assets:  Communication Skills Desire for Improvement Financial Resources/Insurance Housing Leisure Time Physical Health Social Support Transportation Vocational/Educational  ADL's:  Intact  Cognition: WNL  Sleep:  Good     Screenings: AIMS    Flowsheet Row Admission (Discharged) from 05/13/2017 in [facility]  AIMS Total Score 0       AUDIT    Flowsheet Row Admission (Discharged) from 07/27/2020 in [facility] Admission (Discharged) from 05/13/2017 in [facility]  Alcohol Use Disorder Identification Test Final Score (AUDIT) 6 0      GAD-7    Flowsheet Row Counselor from 07/30/2022 in [facility] Counselor from 10/04/2021 in [facility] Office Visit from 04/05/2020 in [facility] at [facility]  Total GAD-7 Score '11 15 7      '$ PHQ2-9    Flowsheet Row Counselor from 07/30/2022 in [facility] Counselor from 10/04/2021 in [facility] Office Visit from 06/15/2021 in [facility] Counselor from 05/01/2021 in [facility] from 03/23/2021 in [facility]  PHQ-2 Total Score '2 3 1 '$ 0 0  PHQ-9 Total Score 5 17 -- -- --      Flowsheet Row ED from 09/29/2022 in [facility] DEPT Counselor from 09/11/2022 in [facility] Counselor from 07/30/2022 in [facility] CATEGORY No Risk Low Risk No Risk        Assessment and Plan:   Rateel is a 27 yr old Caucasian female who is single, employed, has a history of bipolar disorder, generalized anxiety disorder, insomnia evaluated today for a follow-up visit for medication management.   She appeared to have brief episode of depressed mood, tearfulness lasting for about 3 days last week, other than that she has reported continued stability with mood and anxiety.  Continues to see her therapist once a month, continues to work, taking medications consistently.  Recommending to continue with current treatment and follow back again in about 2 to 3 months or earlier if needed.     Plan as below.   Reviewed on 10/17/22   Plan  Bipolar 2 disorder (chronic, stable) Continue with lamotrigine 150 mg daily.  Continue Luvox  200 mg p.o. daily Continue Abilify at '10mg'$  po qd.   For generalized anxiety disorder-(chronic and stable) -  ind therapy at [facility],  Luvox as mentioned above.  Medications above will be helpful in decreasing anxiety. Previously tried Prozac upto 10 mg daily and Zoloft 150 mg daily - stopped due to inefficacy.    For insomnia-chronic and stable Continue trazodone '150mg'$  p.o. nightly Can take Atarax 25-50 mg QHS PRN for sleep.   Labs including CBC, CMP, TSH were ordered by PCP and appears stable. She is yet to do lipid panel, hemoglobin A1c, vitamin D and B12 levels were ordered , she has not done yet, strongly recommended her to get the blood work done in a month.    This note was generated in part or whole with voice recognition software. Voice recognition is usually quite accurate but there are transcription errors that can and very often do occur. I apologize for any typographical errors that were not detected and corrected.    MDM = 2 or more chronic stable conditions + med management  Victoria Erm, MD 10/17/2022, 11:00 AM

## 2022-11-19 ENCOUNTER — Encounter (HOSPITAL_COMMUNITY): Payer: Self-pay

## 2022-11-19 ENCOUNTER — Other Ambulatory Visit (HOSPITAL_COMMUNITY): Payer: Self-pay

## 2022-11-19 ENCOUNTER — Other Ambulatory Visit: Payer: Self-pay

## 2022-11-19 ENCOUNTER — Ambulatory Visit (INDEPENDENT_AMBULATORY_CARE_PROVIDER_SITE_OTHER): Payer: Medicaid Other | Admitting: Licensed Clinical Social Worker

## 2022-11-19 DIAGNOSIS — Z91199 Patient's noncompliance with other medical treatment and regimen due to unspecified reason: Secondary | ICD-10-CM

## 2022-11-19 NOTE — Progress Notes (Signed)
LCSW counselor attempted to connect with patient for scheduled appointment via MyChart video text request x 2 and email request with no response; also attempted to connect via phone without success. LCSW counselor attempted to leave phone message unsuccessfully.   Attempt 1: Text and email: 8:04a  Attempt 2: Text and email: 8:11a  Attempt 3: phone call: 8:15--could not leave message (no answer/nobody speaking on other line)  Per [place], after multiple attempts to reach pt unsuccessfully at appointed time--visit will be coded as no show

## 2022-12-12 ENCOUNTER — Telehealth (HOSPITAL_COMMUNITY): Payer: Self-pay | Admitting: Psychiatry

## 2022-12-12 ENCOUNTER — Encounter (HOSPITAL_COMMUNITY): Payer: Self-pay | Admitting: Licensed Clinical Social Worker

## 2022-12-12 ENCOUNTER — Telehealth (HOSPITAL_COMMUNITY): Payer: Self-pay | Admitting: Professional

## 2022-12-12 ENCOUNTER — Ambulatory Visit (INDEPENDENT_AMBULATORY_CARE_PROVIDER_SITE_OTHER): Payer: Medicaid Other | Admitting: Licensed Clinical Social Worker

## 2022-12-12 DIAGNOSIS — F411 Generalized anxiety disorder: Secondary | ICD-10-CM

## 2022-12-12 DIAGNOSIS — F3181 Bipolar II disorder: Secondary | ICD-10-CM | POA: Diagnosis not present

## 2022-12-12 NOTE — Progress Notes (Addendum)
Virtual Visit via Video Note  I connected with GINNA SOSCIA on 12/12/22 at 10:00 AM EST by a video enabled telemedicine application and verified that I am speaking with the correct person using two identifiers.  Location: Patient: home Provider: [facility]   I discussed the limitations of evaluation and management by telemedicine and the availability of in person appointments. The patient expressed understanding and agreed to proceed.   I discussed the assessment and treatment plan with the patient. The patient was provided an opportunity to ask questions and all were answered. The patient agreed with the plan and demonstrated an understanding of the instructions.   The patient was advised to call back or seek an in-person evaluation if the symptoms worsen or if the condition fails to improve as anticipated.  I provided 43 minutes of non-face-to-face time during this encounter.   Yell, LCSW  THERAPIST PROGRESS NOTE  Session Time: [id number]  Participation Level: Active  Behavioral Response: Neat and Well GroomedAlertAnxious  Type of Therapy: Individual Therapy  Treatment Goals addressed: Problem: Alleviate depressive/manic symptoms and return to improved levels of effective functioning. Goal: LTG: Stabilize mood and increase goal-directed behavior: per pt self report Outcome: Progressing Goal: STG: Du[place] WILL ATTEND AT LEAST 80% OF SCHEDULED FOLLOW-UP OPT APPOINTMENTS Outcome: Progressing  Interventions: CBT, trauma focused   Summary: Dung DELAIN HAAGENSEN is a 27 y.o. female who presents with symptoms consistent with bipolar II diagnosis.   Patient reports that she had a psychiatric crisis in January, and patient reports that she had suicidal ideation with the plan to cut her throat with a knife. Patient states that she had knife in hand and had put the knife all the way to her throat. Patient states that something stopped her, and  she threw the knife in the floor and disclosed to her mother what was going on.  Drafted safety plan with patient, and provided pt with copy of it. Reviewed local crisis resources and referrred pt to IOP/PHP program.   Patient reports that one of her primary stressors is recent break-up with her spouse-patient reports that she felt betrayed by her spouse after she found out that the spouse was lying about going to work, and was actually going to a mutual friend's home and using substances.  Allow patient safe space to explore thoughts and feelings associated with this relationship.  Patient states that she has moved forward, and is currently seeing someone else.  Validated patient's experiences and provided unconditional support.  Encouraged patient to continue grieving the loss of her previous relationship, and also embracing the positives of her new relationship.  Patient reports that things are going well at work-patient is not having any conflicts with colleagues or supervisors, and enjoys the kind of work that she does.  Patient does report that she is experiencing some anxiety associated with going into work.  Patient states " I just do not want to go into work".  Acknowledged patient's feelings about work, and allowed patient to identify the benefits and consequences of going versus not going to work.  Reviewed coping skills for managing depression symptoms, anxiety symptoms, and overall stress.  Continued recommendations are as follows: self care behaviors, positive social engagements, focusing on overall work/home/life balance, and focusing on positive physical and emotional wellness.   Suicidal/Homicidal: No currently.  Patient reports that she had suicidal ideation with a plan to hold a knife to her throat in January 2024.  Therapist Response: Pt is continuing  to apply interventions learned in session into daily life situations. Pt is currently on track to meet goals utilizing interventions  mentioned above. Personal growth and progress noted. Treatment to continue as indicated.   Develop safety plan with patient and referred to IOP/PHP.  Plan: Return again in 1 months  Diagnosis:  Encounter Diagnoses  Name Primary?   Bipolar 2 disorder, major depressive episode ([place]) Yes   GAD (generalized anxiety disorder)     Collaboration of Care: Other pt encouraged to continue care with psychiatrist of record, Dr. Leotis Shames.  Patient/Guardian was advised Release of Information must be obtained prior to any record release in order to collaborate their care with an outside provider. Patient/Guardian was advised if they have not already done so to contact the registration department to sign all necessary forms in order for [name] to release information regarding their care.   Consent: Patient/Guardian gives verbal consent for treatment and assignment of benefits for services provided during this visit. Patient/Guardian expressed understanding and agreed to proceed.    [place], LCSW 12/12/2022

## 2022-12-12 NOTE — Progress Notes (Signed)
   12/12/22 1026  Step One: Remove Access  What might I use to hurt myself? knife--all knives removed from the home  What other actions can I take to limit ways that I can hurt myself? knife  Step Two: Warning Signs  What are my specific triggers or warning signs a crisis is developing? Feeling down, sad, or crying;Feeling hopeless things won't ever get better;Feeling worthless or a failure;Feeling alone or rejected;Feeling trapped, no way out;Feeling anxious, agitated;Withdrawing, feeling isolated;Feeling angry, argumentative, or short tempered;Feeling overwhelmed by life circumstances, e.g, finances;Failing, doing poorly at something  Step Three: Distracting Activities  What engaging and calming activities can I do to distract myself? Watch television;Do something nice for someone else;Go online, play video games;Read;Listen to music;Go for a walk, exercise;Do a hobby, favorite activity  Step Four: Distracting People and Places  Who helps me to take my mind off of my problems? cousin, Mariane Duval, best friend, Herbert Spires, girlfriend, Davis Gourd  Where can I go to take my mind off my problems? To my best friends house or hanging out with Davis Gourd.  Going out of the house anywhere.  Step Five: Social Support  Which family members or friends can I talk to to help me feel better? Tempie Donning, grandmother, mother  Who can help me get through the crisis? friends and family  Who is supportive? friends and family  Step Six: Professional Help  Where can I get help? 988, 911, any local ED, [facility] ([facility]) [place]

## 2022-12-24 ENCOUNTER — Other Ambulatory Visit (HOSPITAL_COMMUNITY): Payer: Self-pay

## 2022-12-25 ENCOUNTER — Telehealth (INDEPENDENT_AMBULATORY_CARE_PROVIDER_SITE_OTHER): Payer: Medicaid Other | Admitting: Family Medicine

## 2022-12-25 ENCOUNTER — Other Ambulatory Visit (HOSPITAL_COMMUNITY): Payer: Self-pay

## 2022-12-25 ENCOUNTER — Encounter: Payer: Self-pay | Admitting: Family Medicine

## 2022-12-25 VITALS — Temp 100.0°F | Ht 65.0 in | Wt 200.0 lb

## 2022-12-25 DIAGNOSIS — J111 Influenza due to unidentified influenza virus with other respiratory manifestations: Secondary | ICD-10-CM | POA: Diagnosis not present

## 2022-12-25 MED ORDER — BENZONATATE 100 MG PO CAPS
100.0000 mg | ORAL_CAPSULE | Freq: Three times a day (TID) | ORAL | 0 refills | Status: DC | PRN
  Filled 2022-12-25: qty 30, 10d supply, fill #0

## 2022-12-25 MED ORDER — OSELTAMIVIR PHOSPHATE 75 MG PO CAPS
75.0000 mg | ORAL_CAPSULE | Freq: Two times a day (BID) | ORAL | 0 refills | Status: DC
  Filled 2022-12-25: qty 10, 5d supply, fill #0

## 2022-12-25 NOTE — Assessment & Plan Note (Signed)
Symptoms consistent with influenza, along with + sick contact through work.  Will treat presumptively with tamiflu, reviewed side effects to watch for. Rx tessalon perls for cough.  Further supportive measures reviewed.  Update if not improving, red flags to seek in person care reviewed.  Work excuse provided via Pharmacist, community, rec stay home until fever free x24 hours.  Pt agrees with plan.

## 2022-12-25 NOTE — Progress Notes (Signed)
Patient ID: Victoria Simmons, female    DOB: [birth year], 27 y.o.   MRN: [id number]  Virtual visit completed through [employer], a video enabled telemedicine application. Due to national recommendations of social distancing due to COVID-19, a virtual visit is felt to be most appropriate for this patient at this time. Reviewed limitations, risks, security and privacy concerns of performing a virtual visit and the availability of in person appointments. I also reviewed that there may be a patient responsible charge related to this service. The patient agreed to proceed.   Patient location: home Provider location: [place] at [facility], office Persons participating in this virtual visit: patient, provider   If any vitals were documented, they were collected by patient at home unless specified below.    Temp 100 F (37.8 C)   Ht 5\' 5"  (1.651 m)   Wt 200 lb (90.7 kg)   LMP 11/27/2022   BMI 33.28 kg/m    CC: cough, fever  Subjective:   HPI: Victoria Simmons is a 27 y.o. female presenting on 12/25/2022 for Cough (C/o cough, fever- max 102 and lightheaded. Sxs started 12/22/22. Neg home Covid test- 12/24/22. )   3d h/o cough, chest pain, fever Tmax 102, lightheadedness. Body aches, HA, ST, abd pain, diarrhea. + shortness of breath. No wheezing. No h/o asthma. She feels she's staying well hydrated. Decreased appetite.   No congestion, nausea/vomiting.   Fredrich Birks recently sick last week - with influenza.  Took negative home COVID test yesterday.      Relevant past medical, surgical, family and social history reviewed and updated as indicated. Interim medical history since our last visit reviewed. Allergies and medications reviewed and updated. Outpatient Medications Prior to Visit  Medication Sig Dispense Refill   ARIPiprazole (ABILIFY) 10 MG tablet Take 1 tablet (10 mg total) by mouth at bedtime. 90 tablet 1   fluvoxaMINE (LUVOX) 100 MG tablet Take 2 tablets (200 mg total) by mouth at bedtime.  180 tablet 1   lamoTRIgine (LAMICTAL) 100 MG tablet Take 1.5 tablets (150 mg total) by mouth at bedtime. 135 tablet 1   Levonorgestrel (KYLEENA IU) by Intrauterine route.     mupirocin cream (BACTROBAN) 2 % Apply 1 application. topically 2 (two) times daily. 15 g 0   ondansetron (ZOFRAN-ODT) 4 MG disintegrating tablet Take 1 tablet (4 mg total) by mouth every 8 (eight) hours as needed for nausea or vomiting. 20 tablet 0   traZODone (DESYREL) 100 MG tablet Take 1.5 tablets (150 mg total) by mouth at bedtime. 135 tablet 1   No facility-administered medications prior to visit.     Per HPI unless specifically indicated in ROS section below Review of Systems Objective:  Temp 100 F (37.8 C)   Ht 5\' 5"  (1.651 m)   Wt 200 lb (90.7 kg)   LMP 11/27/2022   BMI 33.28 kg/m   Wt Readings from Last 3 Encounters:  12/25/22 200 lb (90.7 kg)  09/29/22 200 lb (90.7 kg)  09/14/22 195 lb (88.5 kg)       Physical exam: Gen: alert, NAD, not ill appearing Pulm: speaks in complete sentences without increased work of breathing Psych: normal mood, normal thought content      Assessment & Plan:   Influenza-like illness Assessment & Plan: Symptoms consistent with influenza, along with + sick contact through work.  Will treat presumptively with tamiflu, reviewed side effects to watch for. Rx tessalon perls for cough.  Further supportive measures reviewed.  Update if  not improving, red flags to seek in person care reviewed.  Work excuse provided via Pharmacist, community, rec stay home until fever free x24 hours.  Pt agrees with plan.    Other orders -     Oseltamivir Phosphate; Take 1 capsule (75 mg total) by mouth 2 (two) times daily.  Dispense: 10 capsule; Refill: 0 -     Benzonatate; Take 1 capsule (100 mg total) by mouth 3 (three) times daily as needed for cough.  Dispense: 30 capsule; Refill: 0     I discussed the assessment and treatment plan with the patient. The patient was provided an opportunity  to ask questions and all were answered. The patient agreed with the plan and demonstrated an understanding of the instructions. The patient was advised to call back or seek an in-person evaluation if the symptoms worsen or if the condition fails to improve as anticipated.  Follow up plan: No follow-ups on file.  Ria Bush, MD

## 2022-12-26 ENCOUNTER — Other Ambulatory Visit (HOSPITAL_COMMUNITY): Payer: Self-pay

## 2022-12-26 ENCOUNTER — Telehealth (INDEPENDENT_AMBULATORY_CARE_PROVIDER_SITE_OTHER): Payer: Medicaid Other | Admitting: Child and Adolescent Psychiatry

## 2022-12-26 DIAGNOSIS — F411 Generalized anxiety disorder: Secondary | ICD-10-CM | POA: Diagnosis not present

## 2022-12-26 DIAGNOSIS — F3181 Bipolar II disorder: Secondary | ICD-10-CM

## 2022-12-26 MED ORDER — BUPROPION HCL ER (XL) 150 MG PO TB24
150.0000 mg | ORAL_TABLET | Freq: Every day | ORAL | 0 refills | Status: DC
  Filled 2022-12-26: qty 30, 30d supply, fill #0

## 2022-12-26 NOTE — Progress Notes (Signed)
Virtual Visit via Video Note  I connected with Victoria Simmons on 12/26/22 at 10:30 AM EDT by a video enabled telemedicine application and verified that I am speaking with the correct person using two identifiers.  Location: Patient: at work Provider: office   I discussed the limitations of evaluation and management by telemedicine and the availability of in person appointments. The patient expressed understanding and agreed to proceed.    I discussed the assessment and treatment plan with the patient. The patient was provided an opportunity to ask questions and all were answered. The patient agreed with the plan and demonstrated an understanding of the instructions.   The patient was advised to call back or seek an in-person evaluation if the symptoms worsen or if the condition fails to improve as anticipated.    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 Einar Grad since 2017 and transition to this writer in April 2020.  She is currently prescribed Lamictal 150 mg once a day, Abilify 10 mg once a day, trazodone 150 mg once a day and Luvox 150 mg once a day.   HPI:   Victoria Simmons was seen and evaluated over telemedicine encounter for medication management follow-up.  In the interim since last appointment, she saw her therapist, disclosed that she was having suicidal thoughts, and self aborted suicide attempt sometime in January.  She was subsequently recommended IOP/PHP, however she tells me because of her work she has not been able to start IOP or PHP.    Today she says that she has been more depressed recently, has been having more challenges with lack of energy and motivation, staying in her home more rather than  socializing.  She says that she still goes to work and doing fairly okay with work but has noticed more anxiety around it as well.  She says that her mood is around 6 out of 10, 10 being the best mood on most days, intermittently can be less than 6, and she has intermittent passive suicidal thoughts.  She says that about early February, she had a knife next to her throat, but did not act on it, talked herself out of it, spoke with her therapist and her mother is also aware.  Mother has removed all the sharps from her home.  She denies any new psychosocial stressors but it appears that she has been thinking about her past relationship with her ex-spouse with whom she is separated and that seems to have contributed to this.  She denies any current SI at present, still sleeps well, reports that her appetite is low.  She says that she has been consistently taking her medications, occasionally drinks alcohol, smokes and THC that she buys from her store about 2 or 3 times a week that she claims helps her with her anxiety and stomach problems.  She denies any other recreational drug use.  We discussed that PHP would be a good choice for her and will be beneficial, she is going to talk to her boss to see if she can start working around 3 PM instead of starting in the morning for the time while she is at [facility].  We discussed medication management, discussed  other options for medications, after discussing pros and cons, discussed to start Wellbutrin in addition to her current medications.  She provided verbal informed consent after hearing side effects including but not limited to black box warning associated with Wellbutrin.  She will follow-up again in a month or earlier if needed.  Visit Diagnosis:    ICD-10-CM   1. Bipolar 2 disorder, major depressive episode ([place])  F31.81     2. GAD (generalized anxiety disorder)  F41.1         Past Psychiatric History: PPHx reviewed today and as followin. 2 previous inpatient  admission at [facility] at the age of 38, and last at [facility], no previous suicide attempts, has history of cutting and her past medication trials include Prozac and Zoloft.  Decreasing Luvox to 50 mg worsened her symptoms of anxiety therefore it was increased back to 100 mg once a day.   Previous trials of Zoloft 150 mg and Prozac 10 mg stopped due to inefficacy.   Past Medical History:  Past Medical History:  Diagnosis Date   Anxiety    Bipolar 2 disorder ([place])    Depression    Dysmenorrhea    Hypertriglyceridemia 2020   MDD (major depressive disorder) 06/19/2017   Ovarian torsion 2008    Past Surgical History:  Procedure Laterality Date   COLONOSCOPY  08/2020   polyp, int hem, benign biopsies ([place])   ESOPHAGOGASTRODUODENOSCOPY  08/2020   LA grade A reflux esophagitis and antral gastritis, H pylori neg, treated with PPI BID and carafate course ([place]  06/30/2007   dx laparoscopy, reduction of right adnexal torsion, bilateral oophoropexy of each ovary to posterior uterine fundus    Family Psychiatric History: As mentioned in initial H&P, reviewed today, no change Family History:  Family History  Problem Relation Age of Onset   Hepatitis B Mother    Anxiety disorder Mother    Hypertension Father    ADD / ADHD Brother    Ovarian cancer Paternal Grandmother     Social History:  Social History   Socioeconomic History   Marital status: Legally Separated    Spouse name: Not on file   Number of children: 0   Years of education: Not on file   Highest education level: Some college, no degree  Occupational History   Occupation: [employer]    Comment: full time  Tobacco Use   Smoking status: Never   Smokeless tobacco: Never  Vaping Use   Vaping Use: Former  Substance and Sexual Activity   Alcohol use: Yes    Comment: twice a month (5-7 drinks)   Drug use: Yes    Types: Marijuana    Comment: last use 16 Aug 2020   Sexual activity: Yes  Other  Topics Concern   Not on file  Social History Narrative          Social Determinants of Health   Financial Resource Strain: Low Risk  (08/26/2017)   Overall Financial Resource Strain (CARDIA)    Difficulty of Paying Living Expenses: Not hard at all  Food Insecurity: No Food Insecurity (08/26/2017)   Hunger Vital Sign    Worried About Running Out of Food in the Last Year: Never true    Ran Out of Food in the Last Year: Never true  Transportation Needs: No Transportation Needs (08/26/2017)   PRAPARE - Hydrologist (Medical): No    Lack of Transportation (Non-Medical): No  Physical  Activity: Sufficiently Active (08/26/2017)   Exercise Vital Sign    Days of Exercise per Week: 5 days    Minutes of Exercise per Session: 60 min  Stress: No Stress Concern Present (08/26/2017)   [place]    Feeling of Stress : Not at all  Social Connections: Moderately Isolated (08/26/2017)   Social Connection and Isolation Panel [NHANES]    Frequency of Communication with Friends and Family: Three times a week    Frequency of Social Gatherings with Friends and Family: More than three times a week    Attends Religious Services: Never    Marine scientist or Organizations: No    Attends Music therapist: Never    Marital Status: Never married    Allergies: No Known Allergies  Metabolic Disorder Labs: Lab Results  Component Value Date   HGBA1C 5.0 07/26/2020   MPG 97 07/26/2020   MPG 94 05/14/2017   No results found for: "PROLACTIN" Lab Results  Component Value Date   CHOL 226 (H) 07/26/2020   TRIG 138 07/26/2020   HDL 44 07/26/2020   CHOLHDL 5.1 07/26/2020   VLDL 28 07/26/2020   LDLCALC 154 (H) 07/26/2020   [place] 103 (H) 04/05/2020   Lab Results  Component Value Date   TSH 2.70 09/12/2021   TSH 2.401 07/26/2020    Therapeutic Level Labs: No results found for:  "LITHIUM" No results found for: "VALPROATE" No results found for: "CBMZ"  Current Medications: Current Outpatient Medications  Medication Sig Dispense Refill   buPROPion (WELLBUTRIN XL) 150 MG 24 hr tablet Take 1 tablet (150 mg total) by mouth daily. 30 tablet 0   ARIPiprazole (ABILIFY) 10 MG tablet Take 1 tablet (10 mg total) by mouth at bedtime. 90 tablet 1   benzonatate (TESSALON) 100 MG capsule Take 1 capsule (100 mg total) by mouth 3 (three) times daily as needed for cough. 30 capsule 0   fluvoxaMINE (LUVOX) 100 MG tablet Take 2 tablets (200 mg total) by mouth at bedtime. 180 tablet 1   lamoTRIgine (LAMICTAL) 100 MG tablet Take 1.5 tablets (150 mg total) by mouth at bedtime. 135 tablet 1   Levonorgestrel (KYLEENA IU) by Intrauterine route.     mupirocin cream (BACTROBAN) 2 % Apply 1 application. topically 2 (two) times daily. 15 g 0   ondansetron (ZOFRAN-ODT) 4 MG disintegrating tablet Take 1 tablet (4 mg total) by mouth every 8 (eight) hours as needed for nausea or vomiting. 20 tablet 0   oseltamivir (TAMIFLU) 75 MG capsule Take 1 capsule (75 mg total) by mouth 2 (two) times daily. 10 capsule 0   traZODone (DESYREL) 100 MG tablet Take 1.5 tablets (150 mg total) by mouth at bedtime. 135 tablet 1   No current facility-administered medications for this visit.     Musculoskeletal: Strength & Muscle Tone: unable to assess since visit was over the telemedicine.  Gait & Station: unable to assess since visit was over the telemedicine.  Patient leans: N/A  Psychiatric Specialty Exam: ROSReview of 12 systems negative except as mentioned in HPI   Last menstrual period 11/27/2022.There is no height or weight on file to calculate BMI.  General Appearance: Casual and Well Groomed  Eye Contact:  Good  Speech:  Clear and Coherent and Normal Rate  Volume:  Normal  Mood: "ok..."  Affect:  Appropriate, Congruent, and Restricted  Thought Process:  Goal Directed and Linear  Orientation:  Full  (Time, Place,  and Person)  Thought Content: Logical   Suicidal Thoughts:  No  Homicidal Thoughts:  No  Memory:  Immediate;   Fair Recent;   Fair Remote;   Fair  Judgement:  Good  Insight:  Good  Psychomotor Activity:  Normal  Concentration:  Concentration: Good and Attention Span: Good  Recall:  Good  Fund of Knowledge: Good  Language: Good  Akathisia:  No    AIMS (if indicated): not done  Assets:  Communication Skills Desire for Improvement Financial Resources/Insurance Housing Leisure Time Physical Health Social Support Transportation Vocational/Educational  ADL's:  Intact  Cognition: WNL  Sleep:  Good     Screenings: AIMS    Flowsheet Row Admission (Discharged) from 05/13/2017 in [place] Total Score 0      AUDIT    Flowsheet Row Admission (Discharged) from 07/27/2020 in [place] [facility] Admission (Discharged) from 05/13/2017 in [place]  Alcohol Use Disorder Identification Test Final Score (AUDIT) 6 0      GAD-7    Flowsheet Row Counselor from 07/30/2022 in [place] at [place] from 10/04/2021 in [place] Office Visit from 04/05/2020 in [place] at [facility]  Total GAD-7 Score 11 15 7       PHQ2-9    Flowsheet Row Counselor from 07/30/2022 in [place] at [facility] from 10/04/2021 in [place] Office Visit from 06/15/2021 in [place] Counselor from 05/01/2021 in [place] Counselor from 03/23/2021 in [place]  PHQ-2 Total Score 2 3 1  0 0  PHQ-9 Total Score 5 17 -- -- --      Flowsheet Row Counselor from 12/12/2022 in [place] at [facility] ED from 09/29/2022 in [facility] Emergency Department at [place] from 09/11/2022 in [place] at [place] High Risk No Risk Low Risk        Assessment and Plan:   Victoria Simmons is a 27 yr old Caucasian female who is single, employed, has a history of bipolar disorder, generalized anxiety disorder, insomnia evaluated today for a follow-up visit for medication management.   She reports that more recently she has been having more depressive symptoms, having difficulties with motivation and energy and more isolative.  She had an incident in early February where she had self aborted suicide attempt (kept knife close to her throat, but talked her self out of it, spoke with therapist and her mother afterwards), has intermittent passive SI since then, last occurred about a week ago. She was referred to IOP/PHP, IOP does not take her insurance but PHP does however because of her working hours she has not been able to join.  She will be talking to her boss at work to see if her hours can be accommodated so that she can attend PHP.  She has developed a safety plan with her therapist and following with.  We discussed to add Wellbutrin XL 150 mg daily for depression.  Discussed side effects including but not limited to black box warning associated with Wellbutrin, she provided verbal informed consent.  Plan as mentioned below.      Plan as below.  Reviewed on 12/26/22   Plan  Bipolar 2 disorder (chronic, stable) Continue with lamotrigine 150 mg daily.  Continue Luvox  200 mg p.o. daily Continue Abilify at 10mg  po qd. Start Wellbutrin XL 150 mg daily.    For generalized anxiety disorder-(chronic and stable) -  ind therapy at [facility],  Luvox and wellbutrin as mentioned above.  Medications above will be helpful in decreasing anxiety. Previously tried Prozac upto 10 mg daily and Zoloft 150 mg daily - stopped due to  inefficacy.    For insomnia-chronic and stable Continue trazodone 150mg  p.o. nightly Can take Atarax 25-50 mg QHS PRN for sleep.   Labs including CBC, CMP, TSH were ordered by PCP and appears stable. She is yet to do lipid panel, hemoglobin A1c, vitamin D and B12 levels were ordered , she has not done yet, strongly recommended her to get the blood work done in a month.    A suicide and violence risk assessment was performed as part of this evaluation. The patient is deemed to be at chronic elevated risk for self-harm/suicide given the following factors: current diagnosis of Bipolar disorder, GAD and hx of SI, cutting. The patient is deemed to be at chronic elevated risk for violence given the following factors: younger age. These risk factors are mitigated by the following factors:lack of active SI/HI, no known naccess to weapons or firearms, no history of violence, motivation for treatment, utilization of positive coping skills, supportive family, presence of an available support system, employment or functioning in a structured work/academic setting, enjoyment of leisure actvities, current treatment compliance, safe housing and support system in agreement with treatment recommendations. There is no acute risk for suicide or violence at this time. The patient was educated about relevant modifiable risk factors including following recommendations for treatment of psychiatric illness and abstaining from substance abuse. While future psychiatric events cannot be accurately predicted, the patient does not request acute inpatient psychiatric care and does not currently meet [facility] involuntary commitment criteria.      This note was generated in part or whole with voice recognition software. Voice recognition is usually quite accurate but there are transcription errors that can and very often do occur. I apologize for any typographical errors that were not detected and corrected.    MDM = 2 or  more chronic stable conditions + med management  Orlene Erm, MD 12/26/2022, 5:37 PM

## 2022-12-27 ENCOUNTER — Other Ambulatory Visit (HOSPITAL_COMMUNITY): Payer: Self-pay

## 2022-12-31 ENCOUNTER — Telehealth (HOSPITAL_COMMUNITY): Payer: Self-pay | Admitting: Licensed Clinical Social Worker

## 2023-01-02 ENCOUNTER — Ambulatory Visit (HOSPITAL_COMMUNITY): Payer: Medicaid Other | Admitting: Licensed Clinical Social Worker

## 2023-01-02 ENCOUNTER — Telehealth (HOSPITAL_COMMUNITY): Payer: Self-pay | Admitting: Licensed Clinical Social Worker

## 2023-01-02 DIAGNOSIS — F411 Generalized anxiety disorder: Secondary | ICD-10-CM

## 2023-01-02 DIAGNOSIS — F3181 Bipolar II disorder: Secondary | ICD-10-CM

## 2023-01-02 NOTE — Psych (Signed)
Virtual Visit via Video Note  I connected with Victoria Simmons on 01/02/23 at  1:00 PM EDT by a video enabled telemedicine application and verified that I am speaking with the correct person using two identifiers.  Location: Patient: pt's home Provider: clinical home office   I discussed the limitations of evaluation and management by telemedicine and the availability of in person appointments. The patient expressed understanding and agreed to proceed.   I discussed the assessment and treatment plan with the patient. The patient was provided an opportunity to ask questions and all were answered. The patient agreed with the plan and demonstrated an understanding of the instructions.   The patient was advised to call back or seek an in-person evaluation if the symptoms worsen or if the condition fails to improve as anticipated.  I provided 40 minutes of non-face-to-face time during this encounter.   [name], LCSW   Comprehensive Clinical Assessment (CCA) Note  01/02/2023 Victoria Simmons [id number]  Chief Complaint:  Chief Complaint  Patient presents with   Depression   Anxiety   Visit Diagnosis: Bipolar 2, depressed    CCA Screening, Triage and Referral (STR)  Patient Reported Information How did you hear about [name]? Other (Comment)  Referral name: Therapist- Christina Hussami  Referral phone number: No data recorded  Whom do you see for routine medical problems? Primary Care  Practice/Facility Name: [facility]  Practice/Facility Phone Number: No data recorded Name of Contact: No data recorded Contact Number: No data recorded Contact Fax Number: No data recorded Prescriber Name: No data recorded Prescriber Address (if known): No data recorded  What Is the Reason for Your Visit/Call Today? No data recorded How Long Has This Been Causing You Problems? 1-6 months  What Do You Feel Would Help You the Most Today? Treatment for Depression or other mood  problem   Have You Recently Been in Any Inpatient Treatment (Hospital/Detox/Crisis Center/28-Day Program)? No  Name/Location of Program/Hospital:No data recorded How Long Were You There? No data recorded When Were You Discharged? No data recorded  Have You Ever Received Services From [facility] Before? Yes  Who Do You See at [facility]? No data recorded  Have You Recently Had Any Thoughts About Hurting Yourself? Yes  Are You Planning to Commit Suicide/Harm Yourself At This time? No   Have you Recently Had Thoughts About [place]? No  Explanation: No data recorded  Have You Used Any Alcohol or Drugs in the Past 24 Hours? Yes  How Long Ago Did You Use Drugs or Alcohol? No data recorded What Did You Use and How Much? 2-3 cocktails   Do You Currently Have a Therapist/Psychiatrist? Yes  Name of Therapist/Psychiatrist: Christina Hussami and Dr. Pricilla Larsson   Have You Been Recently Discharged From Any Office Practice or Programs? No  Explanation of Discharge From Practice/Program: No data recorded    CCA Screening Triage Referral Assessment Type of Contact: Tele-Assessment  Is this Initial or Reassessment? Initial Assessment  Date Telepsych consult ordered in [facility]:  No data recorded Time Telepsych consult ordered in [facility]:  No data recorded  Patient Reported Information Reviewed? No data recorded Patient Left Without Being Seen? No data recorded Reason for Not Completing Assessment: No data recorded  Collateral Involvement: chart review   Does Patient Have a Angie? No data recorded Name and Contact of Legal Guardian: No data recorded If Minor and Not Living with Parent(s), Who has Custody? No data recorded Is CPS involved  or ever been involved? Never  Is APS involved or ever been involved? Never   Patient Determined To Be At Risk for Harm To Self or Others Based on Review of Patient Reported Information or Presenting Complaint?  No  Method: No data recorded Availability of Means: No data recorded Intent: No data recorded Notification Required: No data recorded Additional Information for Danger to Others Potential: No data recorded Additional Comments for Danger to Others Potential: No data recorded Are There Guns or Other Weapons in Your Home? No  Types of Guns/Weapons: No data recorded Are These Weapons Safely Secured?                            No data recorded Who Could Verify You Are Able To Have These Secured: No data recorded Do You Have any Outstanding Charges, Pending Court Dates, Parole/Probation? No data recorded Contacted To Inform of Risk of Harm To Self or Others: No data recorded  Location of Assessment: Other (comment)   Does Patient Present under Involuntary Commitment? No  IVC Papers Initial File Date: No data recorded  [place] of Residence: [place]   Patient Currently Receiving the Following Services: Medication Management; Individual Therapy   Determination of Need: Routine (7 days)   Options For Referral: Partial Hospitalization     CCA Biopsychosocial Intake/Chief Complaint:  Victoria Simmons is a 27yo female referred to [facility] by her therapist for a severe depressive episode with passive SI. She denies SI at this time. She reports decreased ADLs regarding household tasks. She currently engages in outpatient therapy and medication management with Christina Hussami and Dr. Pricilla Larsson. She reports two previous hospitalizations, one in 2018 and the other in 2021. She endorses one suicide attempt in 2021. She reports she is diagnosed with bipolar 2 disorder and GAD. She denies HI, AVH, substance abuse history, and medical diagnoses. She endorses previous NSSI, last occurrence approximately seven months ago by scratching her wrist with a paper clip. She reports the following family history: grandmother has depression and anxiety, mother has anxiety, and aunt has bipolar disorder. She cites her  mother and cousin as her supports and states she lives in a small house on her parents' property. She states she drinks alcohol and uses THC regularly. She states there are no firearms in her home, but there are in her parents' home.  Current Symptoms/Problems: passive SI, fatigue, constant anxiety, overthinking, calling out of work frequently, nausea and vomiting every morning (states a medical cause has been ruled out by PCP), decreased appetite, waking up in the middle of the night and can't go back to sleep, waking up too early and can't go back to sleep. Depressive episodes can last months. Current episode started late December.   Patient Reported Schizophrenia/Schizoaffective Diagnosis in Past: No   Strengths: motivation for tx  Preferences: none stated  Abilities: able to engage in tx   Type of Services Patient Feels are Needed: improvement in functioning and reduction in symptoms   Initial Clinical Notes/Concerns: No data recorded  Mental Health Symptoms Depression:   Change in energy/activity; Fatigue; Difficulty Concentrating; Hopelessness; Increase/decrease in appetite; Sleep (too much or little); Worthlessness; Tearfulness   Duration of Depressive symptoms:  Greater than two weeks   Mania:   None   Anxiety:    Difficulty concentrating; Fatigue; Restlessness; Worrying   Psychosis:   None   Duration of Psychotic symptoms: No data recorded  Trauma:   Avoids reminders of event  Obsessions:   None   Compulsions:   None   Inattention:   None   Hyperactivity/Impulsivity:   None   Oppositional/Defiant Behaviors:   None   Emotional Irregularity:   Mood lability   Other Mood/Personality Symptoms:  No data recorded   Mental Status Exam Appearance and self-care  Stature:   Average   Weight:   Overweight   Clothing:   Casual   Grooming:   Normal   Cosmetic use:   Age appropriate   Posture/gait:   Normal   Motor activity:   Not  Remarkable   Sensorium  Attention:   Normal   Concentration:   Normal   Orientation:   X5   Recall/memory:   Normal   Affect and Mood  Affect:   Depressed   Mood:   Depressed   Relating  Eye contact:   Normal   Facial expression:   Responsive   Attitude toward examiner:   Cooperative   Thought and Language  Speech flow:  Normal   Thought content:   Appropriate to Mood and Circumstances   Preoccupation:   None   Hallucinations:   None   Organization:  goal-directed  [employer] of Knowledge:   Average   Intelligence:   Average   Abstraction:   Normal   Judgement:   Good   Reality Testing:   Adequate   Insight:   Good   Decision Making:   Normal   Social Functioning  Social Maturity:   Responsible   Social Judgement:   Normal   Stress  Stressors:   Illness; Financial   Coping Ability:   Programme researcher, broadcasting/film/video Deficits:   Activities of daily living   Supports:   Family; Friends/Service system     Religion: Religion/Spirituality Are You A Religious Person?: No  Leisure/Recreation:    Exercise/Diet: Exercise/Diet Do You Exercise?: No Have You Gained or Lost A Significant Amount of Weight in the Past Six Months?: No Do You Follow a Special Diet?: No Do You Have Any Trouble Sleeping?: Yes Explanation of Sleeping Difficulties: has difficulty staying asleep   CCA Employment/Education Employment/Work Situation: Employment / Work Situation Employment Situation: Employed Where is Patient Currently Employed?: [employer] How Long has Patient Been Employed?: one year Are You Satisfied With Your Job?: Yes Do You Work More Than One Job?: No Patient's Job has Been Impacted by Current Illness: Yes Describe how Patient's Job has Been Impacted: severe anxiety while working What is the [employer] Time Patient has Held a Job?: 3 years Where was the Patient Employed at that Time?: [employer] Has Patient ever  Been in the [employer]?: No  Education: Education Is Patient Currently Attending School?: No Name of [employer] School: [employer] Did Teacher, adult education From [employer]?: Yes Did Physicist, medical?: Yes Did Heritage manager?: No What Was Your Major?: Kinesology Did You Have An Individualized Education Program (IIEP): No Did You Have Any Difficulty At School?: No   CCA Family/Childhood History Family and Relationship History: Family history Marital status: Separated Separated, when?: 03/15/2022 Are you sexually active?: No What is your sexual orientation?: lesbian Does patient have children?: No  Childhood History:  Childhood History By whom was/is the patient raised?: Both parents Additional childhood history information: Close family.  Some tension between parents when they moved to [place] because no family here. Description of patient's relationship with caregiver when they were a child: Mother: it was fine.  it began getting worse during  teenage years but improved.  Father: good Patient's description of current relationship with people who raised him/her: parents are supportive How were you disciplined when you got in trouble as a child/adolescent?: time out, sent to room, spanked Does patient have siblings?: Yes Number of Siblings: 2 Description of patient's current relationship with siblings: "It's all right. It's not the best it could be. Before I started therapy and medication, I was very mean to them." Did patient suffer any verbal/emotional/physical/sexual abuse as a child?: No Has patient ever been sexually abused/assaulted/raped as an adolescent or adult?: No Witnessed domestic violence?: No Has patient been affected by domestic violence as an adult?: No  Child/Adolescent Assessment:     CCA Substance Use Alcohol/Drug Use: Alcohol / Drug Use History of alcohol / drug use?: No history of alcohol / drug abuse                         ASAM's:  Six  Dimensions of Multidimensional Assessment  Dimension 1:  Acute Intoxication and/or Withdrawal Potential:      Dimension 2:  Biomedical Conditions and Complications:      Dimension 3:  Emotional, Behavioral, or Cognitive Conditions and Complications:     Dimension 4:  Readiness to Change:     Dimension 5:  Relapse, Continued use, or Continued Problem Potential:     Dimension 6:  Recovery/Living Environment:     ASAM Severity Score:    ASAM Recommended Level of Treatment:     Substance use Disorder (SUD)    Recommendations for Services/Supports/Treatments:    DSM5 Diagnoses: Patient Active Problem List   Diagnosis Date Noted   Influenza-like illness 12/25/2022   Nausea vomiting and diarrhea 09/14/2022   Syncope 09/14/2022   Left acute suppurative otitis media 03/14/2022   Acne 03/14/2022   Infected epidermoid cyst 11/03/2021   Reflux esophagitis 11/03/2021   Bloody diarrhea 04/06/2020   Generalized abdominal pain 04/06/2020   Well woman exam 06/19/2017   HLD (hyperlipidemia) 06/19/2017   Bipolar 2 disorder, major depressive episode ([place]) 05/13/2017   GAD (generalized anxiety disorder) 05/13/2017   Menorrhagia 01/25/2014   Family history of hypercholesterolemia 01/06/2013   Murmur 01/06/2013    Patient Centered Plan: Patient is on the following Treatment Plan(s):  Depression   Referrals to Alternative Service(s): Referred to Alternative Service(s):   Place:   Date:   Time:    Referred to Alternative Service(s):   Place:   Date:   Time:    Referred to Alternative Service(s):   Place:   Date:   Time:    Referred to Alternative Service(s):   Place:   Date:   Time:      Collaboration of Care: Other provider involved in patient's care AEB referred by therapist  Patient/Guardian was advised Release of Information must be obtained prior to any record release in order to collaborate their care with an outside provider. Patient/Guardian was advised if they have not already  done so to contact the registration department to sign all necessary forms in order for [name] to release information regarding their care.   Consent: Patient/Guardian gives verbal consent for treatment and assignment of benefits for services provided during this visit. Patient/Guardian expressed understanding and agreed to proceed.   [name], LCSW

## 2023-01-07 ENCOUNTER — Other Ambulatory Visit (HOSPITAL_COMMUNITY): Payer: Self-pay

## 2023-01-08 ENCOUNTER — Ambulatory Visit (INDEPENDENT_AMBULATORY_CARE_PROVIDER_SITE_OTHER): Payer: Medicaid Other | Admitting: Licensed Clinical Social Worker

## 2023-01-08 ENCOUNTER — Encounter (HOSPITAL_COMMUNITY): Payer: Self-pay

## 2023-01-08 DIAGNOSIS — F3181 Bipolar II disorder: Secondary | ICD-10-CM | POA: Diagnosis not present

## 2023-01-08 DIAGNOSIS — Z91198 Patient's noncompliance with other medical treatment and regimen for other reason: Secondary | ICD-10-CM

## 2023-01-08 DIAGNOSIS — F411 Generalized anxiety disorder: Secondary | ICD-10-CM
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
## 2023-01-08 NOTE — Progress Notes (Signed)
Spoke with patient via WebEx video call, used 2 identifiers to correctly identify patient. States that groups are going well. No side effects from medications. This is her first time in PHP as recommended by her therapist. Has constant passive SI but no plan or intent. Going through a divorce and has cut back her hours at work that is causing financial stress. On scale 1-10 as 10 being worst she rates depression at 7 and anxiety at 7. Denies HI or AV hallucinations. PHQ9=23. No issues or complaints.

## 2023-01-08 NOTE — Progress Notes (Signed)
Pt currently enrolled in PCP so individual therapy session cancelled until pt graduates from the program

## 2023-01-08 NOTE — Psych (Signed)
Virtual Visit via Video Note  I connected with Victoria Simmons on 01/08/23 at  9:00 AM EDT by a video enabled telemedicine application and verified that I am speaking with the correct person using two identifiers.  Location: Patient: pt's home Provider: clinical home office   I discussed the limitations of evaluation and management by telemedicine and the availability of in person appointments. The patient expressed understanding and agreed to proceed.  I discussed the assessment and treatment plan with the patient. The patient was provided an opportunity to ask questions and all were answered. The patient agreed with the plan and demonstrated an understanding of the instructions.   The patient was advised to call back or seek an in-person evaluation if the symptoms worsen or if the condition fails to improve as anticipated.  I provided 240 minutes of non-face-to-face time during this encounter.   Victoria Simmons   [facility] PHP THERAPIST PROGRESS NOTE  Victoria Simmons [id number]   Session Time: 9:00 am - 10:00 am  Participation Level: Active  Behavioral Response: CasualAlertAnxious and Depressed  Type of Therapy: Group Therapy  Treatment Goals addressed: Coping  Progress Towards Goals: Initial  Interventions: CBT, DBT, Solution Focused, Strength-based, Supportive, and Reframing  Therapist Response: Clinician led check-in regarding current stressors and situation, and review of patient completed daily inventory. Clinician utilized active listening and empathetic response and validated patient emotions. Clinician facilitated processing group on pertinent issues.?   Summary: Patient arrived within time allowed. Patient rates her mood at a 6 on a scale of 1-10 with 10 being best. Pt reported, "I'm excited because it's my sister's birthday but at the same time I get stuck in my head worrying about things." She reports she slept good last night and ate once yesterday. She  endorses current passive SI, denies plan and intent.  Pt able to process.?Pt engaged in discussion.?      Session Time: 10:00 am - 11:00 am  Participation Level: Active  Behavioral Response: CasualAlertAnxious and Depressed  Type of Therapy: Group Therapy  Treatment Goals addressed: Coping  Progress Towards Goals: Initial  Interventions: CBT, DBT, Solution Focused, Strength-based, Supportive, and Reframing  Therapist Response: Cln led group on fostering a sense of purpose and increasing motivation. Cln also discussed the stigma surrounding taking medications for mental illness. Cln utilized CBT techniques to inform discussion.    Summary: Pt engaged in discussion. Pt reports her father recently said hurtful things about how she's just being selfish, while she knows that's not the truth.    Session Time: 11:00 am - 12:00 pm  Participation Level: Active  Behavioral Response: CasualAlertAnxious and Depressed  Type of Therapy: Group Therapy  Treatment Goals addressed: Coping  Progress Towards Goals: Initial  Interventions: CBT, DBT, Solution Focused, Strength-based, Supportive, and Reframing  Therapist Response: Group viewed Ted Talk entitled "How To Not Take Things Personally" presented by Spero Curb. Cln utilized CBT principles to inform discussion while pts were encouraged to share their response to the video. Cln asked each patient to share a time in which they took things personally and reframed the situation based on what was shared in the [place].  Summary: Pt engaged in discussion. She shares an example of taking things personally at work and talks about how she needs to be more compassionate towards herself.    Session Time: 12:00 pm - 1:00 pm  Participation Level: Active  Behavioral Response: CasualAlertAnxious and Depressed  Type of Therapy: Group Therapy  Treatment  Goals addressed: Coping  Progress Towards Goals: Initial  Interventions: CBT, DBT,  Solution Focused, Strength-based, Supportive, and Reframing  Therapist Response: 12:00 - 12:50 pm: See OT note. 12:50 - 1:00 pm: Clinician led check-out. Clinician assessed for immediate needs, medication compliance and efficacy, and safety concerns?  Summary: 12:00 - 12:50 pm: See OT note 12:50 - 1:00 pm: At check-out, patient contracts for safety.?Patient demonstrates progress as evidenced by her continued engagement and by being receptive to treatment. Patient endorses passive SI at the end of group and contracts for safety. She continues to deny plan and intent.  Suicidal/Homicidal: Yeswithout intent/plan  Plan: ?Pt will continue in PHP and medication management while continuing to work on decreasing depression symptoms,?SI, and anxiety symptoms,?and increasing the ability to self manage symptoms.   Collaboration of Care: Medication Management AEB Dr. Candie Chroman  Patient/Guardian was advised Release of Information must be obtained prior to any record release in order to collaborate their care with an outside provider. Patient/Guardian was advised if they have not already done so to contact the registration department to sign all necessary forms in order for [name] to release information regarding their care.   Consent: Patient/Guardian gives verbal consent for treatment and assignment of benefits for services provided during this visit. Patient/Guardian expressed understanding and agreed to proceed.   Diagnosis: Bipolar 2 disorder, major depressive episode [F31.81]    1. Bipolar 2 disorder, major depressive episode   2. GAD (generalized anxiety disorder)       [facility]-PHP THERAPIST 01/08/2023

## 2023-01-09 ENCOUNTER — Ambulatory Visit (INDEPENDENT_AMBULATORY_CARE_PROVIDER_SITE_OTHER): Payer: Medicaid Other | Admitting: Licensed Clinical Social Worker

## 2023-01-09 DIAGNOSIS — F3181 Bipolar II disorder: Secondary | ICD-10-CM | POA: Diagnosis not present

## 2023-01-09 DIAGNOSIS — F411 Generalized anxiety disorder: Secondary | ICD-10-CM

## 2023-01-09 NOTE — Psych (Signed)
Virtual Visit via Video Note  I connected with Victoria Simmons on 01/09/23 at  9:00 AM EDT by a video enabled telemedicine application and verified that I am speaking with the correct person using two identifiers.  Location: Patient: pt's home Provider: clinical home office   I discussed the limitations of evaluation and management by telemedicine and the availability of in person appointments. The patient expressed understanding and agreed to proceed.  I discussed the assessment and treatment plan with the patient. The patient was provided an opportunity to ask questions and all were answered. The patient agreed with the plan and demonstrated an understanding of the instructions.   The patient was advised to call back or seek an in-person evaluation if the symptoms worsen or if the condition fails to improve as anticipated.  I provided 240 minutes of non-face-to-face time during this encounter.   Rinaldo Ratel   [facility] PHP THERAPIST PROGRESS NOTE  BERRY VENABLE OQ:[id number]   Session Time: 9:00 am - 10:00 am  Participation Level: Active  Behavioral Response: CasualAlertAnxious and Depressed  Type of Therapy: Group Therapy  Treatment Goals addressed: Coping  Progress Towards Goals: Progressing  Interventions: CBT, DBT, Solution Focused, Strength-based, Supportive, and Reframing  Therapist Response: Clinician led check-in regarding current stressors and situation, and review of patient completed daily inventory. Clinician utilized active listening and empathetic response and validated patient emotions. Clinician facilitated processing group on pertinent issues.?   Summary: Patient arrived within time allowed. Patient rates her mood at a 5 on a scale of 1-10 with 10 being best. Pt reported, "I have to work today right after the group is over and I have a feeling my Chartered certified accountant is going to have some stuff to say to me about this program." She states she slept very  well last night and reports she ate two meals yesterday. Endorses passive SI, denies plan and intent. Denies NSSI thoughts. Denies topic for processing. Pt able to process.?Pt engaged in discussion.?      Session Time: 10:00 am - 11:00 am  Participation Level: Active  Behavioral Response: CasualAlertAnxious and Depressed  Type of Therapy: Group Therapy  Treatment Goals addressed: Coping  Progress Towards Goals: Progressing  Interventions: CBT, DBT, Solution Focused, Strength-based, Supportive, and Reframing  Therapist Response: Cln led group on anxiety and provided psychoeducation on the amygdala and fear response. Cln utilized CBT principles to inform discussion.   Summary: Pt engaged in discussion. She states she does something called color counting as a mindfulness practice to help with anticipatory anxiety.    Session Time: 11:00 am - 12:00 pm  Participation Level: Active  Behavioral Response: CasualAlertAnxious and Depressed  Type of Therapy: Group Therapy, Spiritual Care  Treatment Goals addressed: Coping  Progress Towards Goals: Progressing  Interventions: Supportive, and Education  Therapist Response: Alain Marion, Chaplain, led group.   Summary: Pt engaged in discussion.   Session Time: 12:00 pm - 1:00 pm  Participation Level: Active  Behavioral Response: CasualAlertAnxious and Depressed  Type of Therapy: Group Therapy  Treatment Goals addressed: Coping  Progress Towards Goals: Progressing  Interventions: CBT, DBT, Solution Focused, Strength-based, Supportive, and Reframing  Therapist Response: 12:00 - 12:50 pm: See OT note. 12:50 - 1:00 pm: Clinician led check-out. Clinician assessed for immediate needs, medication compliance and efficacy, and safety concerns?  Summary: 12:00 - 12:50 pm: See OT note 12:50 - 1:00 pm: At check-out, patient contracts for safety.?Patient demonstrates progress as evidenced by her continued engagement and  by being  receptive to treatment. Patient endorses passive SI at the end of group, denies plan and intent, and contracts for safety.   Suicidal/Homicidal: Yeswithout intent/plan  Plan: ?Pt will continue in PHP and medication management while continuing to work on decreasing depression symptoms,?SI, and anxiety symptoms,?and increasing the ability to self manage symptoms.   Collaboration of Care: Medication Management AEB Dr. Candie Chroman  Patient/Guardian was advised Release of Information must be obtained prior to any record release in order to collaborate their care with an outside provider. Patient/Guardian was advised if they have not already done so to contact the registration department to sign all necessary forms in order for [name] to release information regarding their care.   Consent: Patient/Guardian gives verbal consent for treatment and assignment of benefits for services provided during this visit. Patient/Guardian expressed understanding and agreed to proceed.   Diagnosis: Bipolar 2 disorder, major depressive episode [F31.81]    1. Bipolar 2 disorder, major depressive episode   2. GAD (generalized anxiety disorder)       [facility] THERAPIST 01/09/2023

## 2023-01-10 ENCOUNTER — Ambulatory Visit (INDEPENDENT_AMBULATORY_CARE_PROVIDER_SITE_OTHER): Payer: Medicaid Other | Admitting: Licensed Clinical Social Worker

## 2023-01-10 DIAGNOSIS — F3181 Bipolar II disorder: Secondary | ICD-10-CM

## 2023-01-10 DIAGNOSIS — F411 Generalized anxiety disorder: Secondary | ICD-10-CM

## 2023-01-10 NOTE — Psych (Signed)
Virtual Visit via Video Note  I connected with Victoria Simmons on 01/10/23 at  9:00 AM EDT by a video enabled telemedicine application and verified that I am speaking with the correct person using two identifiers.  Location: Patient: pt's home Provider: clinical home office   I discussed the limitations of evaluation and management by telemedicine and the availability of in person appointments. The patient expressed understanding and agreed to proceed.   I discussed the assessment and treatment plan with the patient. The patient was provided an opportunity to ask questions and all were answered. The patient agreed with the plan and demonstrated an understanding of the instructions.   The patient was advised to call back or seek an in-person evaluation if the symptoms worsen or if the condition fails to improve as anticipated.  I provided 240 minutes of non-face-to-face time during this encounter.   Victoria Simmons   [facility] PHP THERAPIST PROGRESS NOTE  DATRA SCHRAUBEN OQ:[id number]   Session Time: 9:00 am - 10:00 am  Participation Level: Active  Behavioral Response: CasualAlertDepressed  Type of Therapy: Group Therapy  Treatment Goals addressed: Coping  Progress Towards Goals: Progressing  Interventions: CBT, DBT, Solution Focused, Strength-based, Supportive, and Reframing  Therapist Response: Clinician led check-in regarding current stressors and situation, and review of patient completed daily inventory. Clinician utilized active listening and empathetic response and validated patient emotions. Clinician facilitated processing group on pertinent issues.?   Summary: Patient arrived within time allowed. Patient rates her mood at a 5 or 6 on a scale of 1-10 with 10 being best. Pt reported, "I just had a bad night last night and it kind of carried on to this morning." She states her parents are leaving for a cruise with the rest of her family and she will be left alone. She  states her sleep was poor and she states she only ate one meal. Endorses passive SI, denies plan and intent. Also endorses self-harm thoughts because she wants to punish. She is agreeable to discussing self-harm and self-punishment during processing. Pt able to process.?Pt engaged in discussion.?      Session Time: 10:00 am - 11:00 am  Participation Level: Active  Behavioral Response: CasualAlertDepressed  Type of Therapy: Group Therapy  Treatment Goals addressed: Coping  Progress Towards Goals: Progressing  Interventions: CBT, DBT, Solution Focused, Strength-based, Supportive, and Reframing  Therapist Response: Cln led group on self-punishment, self-harm, and self-love. Cln utilized CBT techniques to inform discussion.   Summary: Pt engaged in discussion. She reports that although she understands why she shouldn't punish herself by self-harming and she's not going to, she still feels the need to and still thinks about it. She is receptive to feedback and encouragement from cln and peers.    Session Time: 11:00 am - 12:00 pm  Participation Level: Active  Behavioral Response: CasualAlertDepressed  Type of Therapy: Group Therapy  Treatment Goals addressed: Coping  Progress Towards Goals: Progressing  Interventions: CBT, DBT, Solution Focused, Strength-based, Supportive, and Reframing  Therapist Response: Clinician led group on Cognitive Distortions. Clinician listed different types of cognitive distortions and provided examples. Pts were asked to provide personal examples of cognitive distortions they currently experience or have previously experienced.  Summary: Pt engaged in discussion. She reports she identifies with personalization and demonstrates good insight into the subject matter.   Session Time: 12:00 pm - 1:00 pm  Participation Level: Active  Behavioral Response: CasualAlertDepressed  Type of Therapy: Group Therapy  Treatment Goals addressed:  Coping  Progress Towards Goals: Progressing  Interventions: CBT, DBT, Solution Focused, Strength-based, Supportive, and Reframing  Therapist Response: 12:00 - 12:50 pm: See OT note. 12:50 - 1:00 pm: Clinician led check-out. Clinician assessed for immediate needs, medication compliance and efficacy, and safety concerns?  Summary: 12:00 - 12:50 pm: See OT note 12:50 - 1:00 pm: At check-out, patient contracts for safety.?Patient demonstrates progress as evidenced by her continued engagement and by being receptive to treatment. Patient denies SI/HI/self-harm thoughts at the end of group and agrees to seek help should those thoughts/feelings occur.?   Suicidal/Homicidal: Nowithout intent/plan  Plan: ?Pt will continue in PHP and medication management while continuing to work on decreasing depression symptoms,?SI, and anxiety symptoms,?and increasing the ability to self manage symptoms.    Collaboration of Care: Medication Management AEB Dr. Candie Chroman  Patient/Guardian was advised Release of Information must be obtained prior to any record release in order to collaborate their care with an outside provider. Patient/Guardian was advised if they have not already done so to contact the registration department to sign all necessary forms in order for [name] to release information regarding their care.   Consent: Patient/Guardian gives verbal consent for treatment and assignment of benefits for services provided during this visit. Patient/Guardian expressed understanding and agreed to proceed.   Diagnosis: Bipolar 2 disorder, major depressive episode [F31.81]    1. Bipolar 2 disorder, major depressive episode   2. GAD (generalized anxiety disorder)       Victoria Nay, LCSW 01/10/2023

## 2023-01-11 ENCOUNTER — Ambulatory Visit (INDEPENDENT_AMBULATORY_CARE_PROVIDER_SITE_OTHER): Payer: Medicaid Other | Admitting: Licensed Clinical Social Worker

## 2023-01-11 DIAGNOSIS — F3181 Bipolar II disorder: Secondary | ICD-10-CM

## 2023-01-11 DIAGNOSIS — F411 Generalized anxiety disorder: Secondary | ICD-10-CM

## 2023-01-11 NOTE — Progress Notes (Signed)
Cln met with pt individually after group per her request. She states, "The more I think about losing my job, the more I think about hurting myself." She states she is having thoughts of NSSI and SI. She denies plan and intent for both and contracts for safety. She brightens during interaction. Cln reiterates topic from group about reframing unhelpful thoughts and reminded pt that she does not have to think about the future right now. Cln reviewed safety plan with pt: she states her cousin will be home soon and will be going to [employer]. She will ask her best friend to stay with her while her cousin is out of town and if she can't, she will ask her grandma. Pt reports her grandma is "like my best friend" and assured cln her grandma will stay with her so that pt is not alone. Cln repeated mantra that the group came up with to help pt cope with the loss of her job and to remind her that they treated her poorly ("fuck 'em") and suggested she say this mantra when she begins to ruminate, which pt enjoyed and was agreeable to. She contracts for safety again and agrees to focus on self-love over the weekend.

## 2023-01-11 NOTE — Psych (Signed)
Virtual Visit via Video Note  I connected with Victoria Simmons on 01/11/23 at  9:00 AM EDT by a video enabled telemedicine application and verified that I am speaking with the correct person using two identifiers.  Location: Patient: pt's home Provider: clinical home office   I discussed the limitations of evaluation and management by telemedicine and the availability of in person appointments. The patient expressed understanding and agreed to proceed.   I discussed the assessment and treatment plan with the patient. The patient was provided an opportunity to ask questions and all were answered. The patient agreed with the plan and demonstrated an understanding of the instructions.   The patient was advised to call back or seek an in-person evaluation if the symptoms worsen or if the condition fails to improve as anticipated.  I provided 240 minutes of non-face-to-face time during this encounter.   Victoria Simmons   [facility] PHP THERAPIST PROGRESS NOTE  Victoria Simmons [phone]   Session Time: 9:00 am - 10:00 am  Participation Level: Active  Behavioral Response: CasualAlertAnxious and Depressed  Type of Therapy: Group Therapy  Treatment Goals addressed: Coping  Progress Towards Goals: Progressing  Interventions: CBT, DBT, Solution Focused, Strength-based, Supportive, and Reframing  Therapist Response: Clinician led check-in regarding current stressors and situation, and review of patient completed daily inventory. Clinician utilized active listening and empathetic response and validated patient emotions. Clinician facilitated processing group on pertinent issues.?   Summary: Patient arrived within time allowed. Patient rates her mood at a 2 on a scale of 1-10 with 10 being best. Pt reported, "I got fired yesterday. They made me come in and then told me I was fired." She reports she slept poorly and ate one meal. She states her parents are mad at her for getting fired.  Peers were supportive and encouraging. She endorses passive SI and SH thoughts. She shares her safety plan of who she has for support. She is agreeable to discussing self-esteem. Pt able to process.?Pt engaged in discussion.?      Session Time: 10:00 am - 11:00 am  Participation Level: Active  Behavioral Response: CasualAlertAnxious and Depressed  Type of Therapy: Group Therapy  Treatment Goals addressed: Coping  Progress Towards Goals: Progressing  Interventions: CBT, DBT, Solution Focused, Strength-based, Supportive, and Reframing  Therapist Response: Clinician led group on self-esteem and body image. Clinician utilized CBT principles to inform discussion.   Summary: Pt engaged in discussion. She states she thought she was doing well at her job despite her anxiety. She shares how she was embarrassed that her employer forced her to come up there to fire her and she cried in front of people. Her peers were supportive and encouraging. Pt also reports that she has struggled with her body image and wears whatever she wants to increase self-confidence and got tattoos to cover self-harm scars. She echos what has become the group mantra of the morning: "fuck off" in response to others commenting on our bodies.    Session Time: 11:00 am - 12:00 pm  Participation Level: Active  Behavioral Response: CasualAlertAnxious and Depressed  Type of Therapy: Group Therapy  Treatment Goals addressed: Coping  Progress Towards Goals: Progressing  Interventions: CBT, DBT, Solution Focused, Strength-based, Supportive, and Reframing  Therapist Response: Clinician continued group on cognitive distortions. Pts were asked to identify a specific cognitive distortion they experience and clinician asked questions to identify facts, beliefs, and feelings contributing to the distortions and assisted with composing reframing statements.  Clinician utilized CBT principles to inform discussion.  Summary: Pt  engaged in discussion. Pt demonstrates good insight into the subject matter.   Session Time: 12:00 pm - 1:00 pm  Participation Level: Active  Behavioral Response: CasualAlertAnxious and Depressed  Type of Therapy: Group Therapy  Treatment Goals addressed: Coping  Progress Towards Goals: Progressing  Interventions: CBT, DBT, Solution Focused, Strength-based, Supportive, and Reframing  Therapist Response: 12:00 - 12:50 pm: See OT note. 12:50 - 1:00 pm: Clinician led check-out. Clinician assessed for immediate needs, medication compliance and efficacy, and safety concerns?  Summary: 12:00 - 12:50 pm: See OT note 12:50 - 1:00 pm: At check-out, patient contracts for safety.?Patient demonstrates progress as evidenced by her continued engagement and by being receptive to treatment. Patient contracts for safety at the end of group and reviews safety plan with cln.?   Suicidal/Homicidal: Yeswithout intent/plan  Plan: ?Pt will continue in PHP and medication management while continuing to work on decreasing depression symptoms,?SI, and anxiety symptoms,?and increasing the ability to self manage symptoms.    Collaboration of Care: Medication Management AEB Dr. Morrie SheldonMcQuilla  Patient/Guardian was advised Release of Information must be obtained prior to any record release in order to collaborate their care with an outside provider. Patient/Guardian was advised if they have not already done so to contact the registration department to sign all necessary forms in order for us to release information regarding their care.   Consent: Patient/Guardian gives verbal consent for treatment and assignment of benefits for services provided during this visit. Patient/Guardian expressed understanding and agreed to proceed.   Diagnosis: Bipolar 2 disorder, major depressive episode [F31.81]    1. Bipolar 2 disorder, major depressive episode   2. GAD (generalized anxiety disorder)       [facility]  THERAPIST 01/11/2023

## 2023-01-14 ENCOUNTER — Ambulatory Visit (INDEPENDENT_AMBULATORY_CARE_PROVIDER_SITE_OTHER): Payer: Medicaid Other | Admitting: Licensed Clinical Social Worker

## 2023-01-14 DIAGNOSIS — F3181 Bipolar II disorder: Secondary | ICD-10-CM | POA: Diagnosis not present

## 2023-01-14 DIAGNOSIS — F411 Generalized anxiety disorder: Secondary | ICD-10-CM

## 2023-01-14 DIAGNOSIS — F1220 Cannabis dependence, uncomplicated: Secondary | ICD-10-CM

## 2023-01-14 NOTE — Progress Notes (Signed)
Spoke with patient via WebEx video call, used 2 identifiers to correctly identify patient. States that groups are going well. Lost her job last week and this has caused a lot of anxiety. Also feels more depressed and anxious before she lost her job and states she doesn't feel like her medication is working. Has passive SI daily with no plan or intent. Denies HI or AV hallucinations.Drinking alcohol more often now. Over the weekend she had 6 drinks daily plus a bottle of champagne to make mimosas. Does not feel like her medications are working. Message sent to MD for review. On scale 1-10 as 10 being worst she rates depression at 5 and anxiety at 8. No other issues or complaints. No side effects noted.

## 2023-01-14 NOTE — Psych (Signed)
Virtual Visit via Video Note  I connected with Victoria Simmons on 01/14/23 at  9:00 AM EDT by a video enabled telemedicine application and verified that I am speaking with the correct person using two identifiers.  Location: Patient: pt's home Provider: clinical home office   I discussed the limitations of evaluation and management by telemedicine and the availability of in person appointments. The patient expressed understanding and agreed to proceed.   I discussed the assessment and treatment plan with the patient. The patient was provided an opportunity to ask questions and all were answered. The patient agreed with the plan and demonstrated an understanding of the instructions.   The patient was advised to call back or seek an in-person evaluation if the symptoms worsen or if the condition fails to improve as anticipated.  Pt was provided 240 minutes of non-face-to-face time during this encounter.   Donia Guiles, LCSW   [facility] PHP THERAPIST PROGRESS NOTE  Victoria Simmons [id number]   Session Time: 9:00 am - 10:00 am  Participation Level: Active  Behavioral Response: CasualAlertAnxious and Depressed  Type of Therapy: Group Therapy  Treatment Goals addressed: Coping  Progress Towards Goals: Progressing  Interventions: CBT, DBT, Solution Focused, Strength-based, Supportive, and Reframing  Therapist Response: Clinician led check-in regarding current stressors and situation, and review of patient completed daily inventory. Clinician utilized active listening and empathetic response and validated patient emotions. Clinician facilitated processing group on pertinent issues.?   Summary: Patient arrived within time allowed. Patient rates her mood at a 6 on a scale of 1-10 with 10 being best. Pt reports feeling "okay." Pt states she slept 5 hours and ate 2-3x. Pt reports she was social over the weekend and had two outings with friends. Pt reports they went well however she had  increased anxiety at the beginning and drank more alcohol than she wishes. Pt states having about 5-6  drinks each night. Pt identifies passive SI thoughts of "not wanting to be here anymore" and states she was able to manage the thoughts. Pt denies plan or intent. Pt able to process.?Pt engaged in discussion.?        Session Time: 10:00 am - 11:00 am  Participation Level: Active  Behavioral Response: CasualAlertAnxious and Depressed  Type of Therapy: Group Therapy  Treatment Goals addressed: Coping  Progress Towards Goals: Progressing  Interventions: CBT, DBT, Solution Focused, Strength-based, Supportive, and Reframing  Therapist Response: Cln led discussion on unrealistic expectations and the ways expectations can alter perspective. Group members discussed feelings and situations that have occurred due to expectation and how to know if they are unrealistic. Cln brought in CBT thought challenging and reframing to aid discussion.    Therapist Response: Pt engaged in discussion and reports gaining insight.          Session Time: 11:00 -12:00   Participation Level: Active   Behavioral Response: CasualAlertDepressed   Type of Therapy: Group Therapy   Treatment Goals addressed: Coping   Progress Towards Goals: Progressing   Interventions: CBT, DBT, Solution Focused, Strength-based, Supportive, and Reframing   Summary: Cln led discussion on extending grace and kindness to ourselves. Group discussed the messages the give themselves and how it impacts them. Cln utilized CBT and brought in the question: is this a problem or is the issue how I'm perceiving the problem? Group discussed ways they are being harsh with themselves and not allowing mistakes and human-ness.   Therapist Response: Pt engaged in discussion and shares she struggles  with giving herself grace and allowing herself to make mistakes without beating herself up.       Session Time: 12:00 pm - 1:00  pm  Participation Level: Active  Behavioral Response: CasualAlertAnxious and Depressed  Type of Therapy: Group Therapy  Treatment Goals addressed: Coping  Progress Towards Goals: Progressing  Interventions: CBT, DBT, Solution Focused, Strength-based, Supportive, and Reframing  Therapist Response: 12:00 - 12:50 pm: OT group led by cln E. Hollan 12:50 - 1:00 pm: Clinician led check-out. Clinician assessed for immediate needs, medication compliance and efficacy, and safety concerns?  Summary: 12:00 - 12:50 pm: Pt participated 12:50 - 1:00 pm: At check-out, patient reports no immediate concerns.?Patient demonstrates progress as evidenced by her continued engagement and by being receptive to treatment. Pt denies active SI/HI/self-harm thoughts at the end of group.  Suicidal/Homicidal: Yeswithout intent/plan  Plan: ?Pt will continue in PHP and medication management while continuing to work on decreasing depression symptoms,?SI, and anxiety symptoms,?and increasing the ability to self manage symptoms.    Collaboration of Care: Medication Management AEB Dr. Morrie Sheldon  Patient/Guardian was advised Release of Information must be obtained prior to any record release in order to collaborate their care with an outside provider. Patient/Guardian was advised if they have not already done so to contact the registration department to sign all necessary forms in order for [name] to release information regarding their care.   Consent: Patient/Guardian gives verbal consent for treatment and assignment of benefits for services provided during this visit. Patient/Guardian expressed understanding and agreed to proceed.   Diagnosis: Bipolar 2 disorder, major depressive episode [F31.81]    1. Bipolar 2 disorder, major depressive episode   2. GAD (generalized anxiety disorder)   3. Cannabis dependence       Donia Guiles, [place] 01/14/2023

## 2023-01-14 NOTE — Progress Notes (Signed)
Virtual Visit via Video Note  I connected with Victoria Simmons on 01/14/23 at  9:00 AM EDT by a video enabled telemedicine application and verified that I am speaking with the correct person using two identifiers.  Location: Patient: Home Provider: Office   I discussed the limitations of evaluation and management by telemedicine and the availability of in person appointments. The patient expressed understanding and agreed to proceed.     I discussed the assessment and treatment plan with the patient. The patient was provided an opportunity to ask questions and all were answered. The patient agreed with the plan and demonstrated an understanding of the instructions.   The patient was advised to call back or seek an in-person evaluation if the symptoms worsen or if the condition fails to improve as anticipated.    Victoria Morton, MD   [facility] MD/PA/NP OP Progress Note  01/14/2023 1:24 PM Victoria Simmons  MRN:  [id number]  Chief Complaint:  Chief Complaint  Patient presents with   Follow-up   HPI:  Victoria Simmons is a 27 year old Caucasian female, employed at [employer] and [employer] store in [place], with history of bipolar disorder type II, GAD, insomnia was seen by Dr. Daleen Bo since 2017 and transition to this writer in April 2020. She is currently prescribed the following medication regimen: She is admitted to [facility] on 01/08/2023.   Current medication: Bipolar 2 disorder (chronic, stable) Continue with lamotrigine 150 mg daily.  Continue Luvox  200 mg p.o. daily Continue Abilify at 10mg  po qd. Wellbutrin XL 150 mg daily. Started rx 12/26/2022 Continue trazodone 150mg  p.o. nightly Can take Atarax 25-50 mg QHS PRN for sleep.   Forgot to stop Wellbutrin XL.  Otherwise compliant with medications.  Patient reports that she does not feel like her medication is helping her. She feels like that her SI is getting worse and she has been on them long term.  She reports that she has cut back to 2 times/ day from 3-4  times / day. She reports that this weekend she had a good time, but had increase Etoh intake. She reports that she drank because she helping a friend who was in a break up. She reports that she had like 6 drinks in 1 day and then 4 moe and 2 bottles of champagne the next day. Patient reports that she has SI thought the day every day, but feels like she can keep herself safe. Patient denies HI and AVH. Patient reports that she is not sleeping well, she endorses that she is up all night thinking about random things from her past. It takes her a few hours to go to sleep. She reports that she has been taking Abilify and Luvox at night.   Patient reports that her appetite is ok, she is getting at least 2 days/ meal. She does still have some vomiting likely from [facility] use.   Visit Diagnosis:    ICD-10-CM   1. Bipolar 2 disorder, major depressive episode  F31.81     2. GAD (generalized anxiety disorder)  F41.1     3. Cannabis dependence  F12.20       Past Psychiatric History: OPT: Dr. Daleen Bo started at age 52 in the past and Dr. Jerold Coombe currently INPT: 2x, [facility] and [facility], for SA and SI respectively  SA (2018) via OD on prescribed medications, her mom is now responsible for putting her medication out BID Self- harm: Yes cutting wrists, beginning of 2023 was last time, had been doing  intermittently since 2021. Cut b/c she felt worthless and making everyone around her mad. Patient endorsed that he did nothing for her Therapy: Yes in past, has current, no hx of IOP or PHP Medications: Prozac and Zoloft "Decreasing Luvox to 50 mg worsened her symptoms of anxiety therefore it was increased back to 100 mg once a day. Previous trials of Zoloft 150 mg and Prozac 10 mg stopped due to inefficacy "- per Dr. Jerold Coombe   Hx of dx: Bipolar 2 disorder and GAD  Past Medical History:  Past Medical History:  Diagnosis Date   Anxiety    Bipolar 2 disorder    Depression    Dysmenorrhea    Hypertriglyceridemia 2020   MDD  (major depressive disorder) 06/19/2017   Ovarian torsion 2008    Past Surgical History:  Procedure Laterality Date   COLONOSCOPY  08/2020   polyp, int hem, benign biopsies ([facility])   ESOPHAGOGASTRODUODENOSCOPY  08/2020   LA grade A reflux esophagitis and antral gastritis, H pylori neg, treated with PPI BID and carafate course ([facility]   OVARY SURGERY  06/30/2007   dx laparoscopy, reduction of right adnexal torsion, bilateral oophoropexy of each ovary to posterior uterine fundus    Family Psychiatric History: Anxiety: Mom,  M GMA: Eating disorder, Anxiety, depression M G- aunt: Bipolar d/o   M Uncle: Etoh use d/o  Family History:  Family History  Problem Relation Age of Onset   Hepatitis B Mother    Anxiety disorder Mother    Hypertension Father    ADD / ADHD Brother    Ovarian cancer Paternal Grandmother     Social History:  Social History   Socioeconomic History   Marital status: Legally Separated    Spouse name: Not on file   Number of children: 0   Years of education: Not on file   Highest education level: Some college, no degree  Occupational History   Occupation: [employer]    Comment: full time  Tobacco Use   Smoking status: Never   Smokeless tobacco: Never  Vaping Use   Vaping Use: Former  Substance and Sexual Activity   Alcohol use: Yes    Comment: twice a month (5-7 drinks)   Drug use: Yes    Types: Marijuana    Comment: last use 16 Aug 2020   Sexual activity: Yes  Other Topics Concern   Not on file  Social History Narrative          Social Determinants of Health   Financial Resource Strain: Low Risk  (08/26/2017)   Overall Financial Resource Strain (CARDIA)    Difficulty of Paying Living Expenses: Not hard at all  Food Insecurity: No Food Insecurity (08/26/2017)   Hunger Vital Sign    Worried About Running Out of Food in the Last Year: Never true    Ran Out of Food in the Last Year: Never true  Transportation Needs: No  Transportation Needs (08/26/2017)   PRAPARE - Administrator, Civil Service (Medical): No    Lack of Transportation (Non-Medical): No  Physical Activity: Sufficiently Active (08/26/2017)   Exercise Vital Sign    Days of Exercise per Week: 5 days    Minutes of Exercise per Session: 60 min  Stress: No Stress Concern Present (08/26/2017)   [employer] of Occupational Health - Occupational Stress Questionnaire    Feeling of Stress : Not at all  Social Connections: Moderately Isolated (08/26/2017)   Social Connection and Isolation Panel [NHANES]  Frequency of Communication with Friends and Family: Three times a week    Frequency of Social Gatherings with Friends and Family: More than three times a week    Attends Religious Services: Never    Database administrator or Organizations: No    Attends Engineer, structural: Never    Marital Status: Never married    Allergies: No Known Allergies  Metabolic Disorder Labs: Lab Results  Component Value Date   HGBA1C 5.0 07/26/2020   MPG 97 07/26/2020   MPG 94 05/14/2017   No results found for: "PROLACTIN" Lab Results  Component Value Date   CHOL 226 (H) 07/26/2020   TRIG 138 07/26/2020   HDL 44 07/26/2020   CHOLHDL 5.1 07/26/2020   VLDL 28 07/26/2020   LDLCALC 154 (H) 07/26/2020   LDLCALC 103 (H) 04/05/2020   Lab Results  Component Value Date   TSH 2.70 09/12/2021   TSH 2.401 07/26/2020    Therapeutic Level Labs: No results found for: "LITHIUM" No results found for: "VALPROATE" No results found for: "CBMZ"  Current Medications: Current Outpatient Medications  Medication Sig Dispense Refill   ARIPiprazole (ABILIFY) 10 MG tablet Take 1 tablet (10 mg total) by mouth at bedtime. 90 tablet 1   buPROPion (WELLBUTRIN XL) 150 MG 24 hr tablet Take 1 tablet (150 mg total) by mouth daily. 30 tablet 0   fluvoxaMINE (LUVOX) 100 MG tablet Take 2 tablets (200 mg total) by mouth at bedtime. 180 tablet 1    lamoTRIgine (LAMICTAL) 100 MG tablet Take 1.5 tablets (150 mg total) by mouth at bedtime. 135 tablet 1   Levonorgestrel (KYLEENA IU) by Intrauterine route.     mupirocin cream (BACTROBAN) 2 % Apply 1 application. topically 2 (two) times daily. 15 g 0   ondansetron (ZOFRAN-ODT) 4 MG disintegrating tablet Take 1 tablet (4 mg total) by mouth every 8 (eight) hours as needed for nausea or vomiting. 20 tablet 0   traZODone (DESYREL) 100 MG tablet Take 1.5 tablets (150 mg total) by mouth at bedtime. 135 tablet 1   benzonatate (TESSALON) 100 MG capsule Take 1 capsule (100 mg total) by mouth 3 (three) times daily as needed for cough. (Patient not taking: Reported on 01/08/2023) 30 capsule 0   oseltamivir (TAMIFLU) 75 MG capsule Take 1 capsule (75 mg total) by mouth 2 (two) times daily. (Patient not taking: Reported on 01/08/2023) 10 capsule 0   No current facility-administered medications for this visit.      Psychiatric Specialty Exam: Review of Systems  Psychiatric/Behavioral:  Positive for dysphoric mood and sleep disturbance. Negative for hallucinations and suicidal ideas. The patient is nervous/anxious.     Last menstrual period 11/27/2022.There is no height or weight on file to calculate BMI.  General Appearance: Casual  Eye Contact:  Good  Speech:  Clear and Coherent  Volume:  Normal  Mood:  Euthymic  Affect:  Appropriate  Thought Process:  Coherent  Orientation:  Full (Time, Place, and Person)  Thought Content: Logical   Suicidal Thoughts:  No  Homicidal Thoughts:  No  Memory:  Immediate;   Good Recent;   Good  Judgement:  Impaired  Insight:  Shallow  Psychomotor Activity:  Decreased  Concentration:  Concentration: Fair  Recall:  NA  Fund of Knowledge: Fair  Language: Good  Akathisia:  No  Handed:    AIMS (if indicated): not done  Assets:  Communication Skills Desire for Improvement Housing Leisure Time Resilience  ADL's:  Intact  Cognition:  WNL  Sleep:  Poor    Screenings: AIMS    Flowsheet Row Admission (Discharged) from 05/13/2017 in [facility]  AIMS Total Score 0      AUDIT    Flowsheet Row [facility] Admission (Discharged) from 05/13/2017 in [facility]PAT[facility] BEH[facility]AL MEDICINE  Alcohol Use Disorder Identification Test Final Score (AUDIT) 6 0      GAD-7    Flowsheet Row Counselor from 01/02/2023 in [facility] Counselor from 07/30/2022 in [facility] from 10/04/2021 in [facility] Office Visit from 04/05/2020 in [facility]  Total GAD-7 Score 15 11 15 7       PHQ2-9    Flowsheet Row Counselor from 01/08/2023 in [facility] Counselor from 01/02/2023 in [facility] Counselor from 07/30/2022 in [facility] [place] Counselor from 10/04/2021 in [facility] Office Visit from 06/15/2021 in [facility]  PHQ-2 Total Score 6 5 2 3 1   PHQ-9 Total Score 23 23 5 17  --      Flowsheet Row Counselor from 01/02/2023 in [facility] Counselor from 12/12/2022 in [facility] ED from 09/29/2022 in [facility] Department at [facility]  C-SSRS RISK CATEGORY Error: Q3, 4, or 5 should not be populated when Q2 is No High Risk No Risk        Assessment and Plan: Discussed with patient that she continues to engage in substance use that is likely contributing to her medications and ability to be therapeutic as well as destabilizing her mood.  Patient did exercise improved judgment by decreasing marijuana use and will continue to work on this over time.   Patient appears to be very responsive to decreasing substance use and how this can impact her mental health.  At this time do not think increasing patient's medications will be much benefit given concurrent substance use.  Did remind patient that she should discontinue the Wellbutrin as it could be worsening anxiety and has appetite suppressant abilities.  While, this provider does believe patient may not need high doses of Luvox, do not want to titrate patient off too fast to cause rebound depression or anxiety leading to worsen reliance on substances, we will continue to monitor.  Patient does not appear to be a harm to self at this time as she endorses feeling that she can keep herself safe and having someone she can go to if she does not continue to feel this way (her cousin who is living with her).  Did recommend patient move her Luvox and Abilify to the a.m. rather than taking nightly as this may be negatively impacting her ability to fall asleep.  Patient is endorsing staying awake with fairly common ruminative thoughts for hours.   Bipolar 2 disorder, current episode depressed vs substance-induced mood disorder GAD Possible hx of Anorexia nervosa, currently in remission Continue with lamotrigine 150 mg daily.  Continue Luvox  200 mg p.o. daily Continue Abilify at 10mg  po qd. Discontinue Wellbutrin XL 150 mg daily.  Continue trazodone 150mg  p.o. nightly Can take Atarax 25-50 mg QHS PRN for sleep.    Concern for cyclical vomiting syndrome Cannabis use disorder, severe -Patient willing to cut back on THC  EtOH use -  Endorses binging over the weekend, behavior was not related to mood however could be contributing to feeling worse later in the week - Continue to monitor  Collaboration of Care: Collaboration of Care:   Patient/Guardian was advised Release of Information must be obtained prior to any record release in order to collaborate their care with an outside provider.  Patient/Guardian was advised if they have not already done so to contact the registration department to sign all necessary forms in order for us to release information regarding their care.   Consent: Patient/Guardian gives verbal consent for treatment and assignment of benefits for services provided during this visit. Patient/Guardian expressed understanding and agreed to proceed.   PGY-3 Victoria MortonJai B Lateshia Schmoker, MD 01/14/2023, 1:24 PM

## 2023-01-15 ENCOUNTER — Ambulatory Visit (INDEPENDENT_AMBULATORY_CARE_PROVIDER_SITE_OTHER): Payer: Medicaid Other | Admitting: Licensed Clinical Social Worker

## 2023-01-15 DIAGNOSIS — F3181 Bipolar II disorder: Secondary | ICD-10-CM | POA: Diagnosis not present

## 2023-01-15 DIAGNOSIS — F411 Generalized anxiety disorder: Secondary | ICD-10-CM

## 2023-01-16 ENCOUNTER — Ambulatory Visit (INDEPENDENT_AMBULATORY_CARE_PROVIDER_SITE_OTHER): Payer: Medicaid Other | Admitting: Licensed Clinical Social Worker

## 2023-01-16 DIAGNOSIS — F3181 Bipolar II disorder: Secondary | ICD-10-CM

## 2023-01-16 DIAGNOSIS — F411 Generalized anxiety disorder: Secondary | ICD-10-CM

## 2023-01-17 ENCOUNTER — Ambulatory Visit
Admission: EM | Admit: 2023-01-17 | Discharge: 2023-01-17 | Disposition: A | Discharge status: Home / Self Care | Payer: Medicaid Other | Attending: Physician Assistant | Admitting: Physician Assistant

## 2023-01-17 ENCOUNTER — Ambulatory Visit (INDEPENDENT_AMBULATORY_CARE_PROVIDER_SITE_OTHER): Payer: Medicaid Other | Admitting: Licensed Clinical Social Worker

## 2023-01-17 ENCOUNTER — Ambulatory Visit (INDEPENDENT_AMBULATORY_CARE_PROVIDER_SITE_OTHER): Payer: Medicaid Other

## 2023-01-17 DIAGNOSIS — S93402A Sprain of unspecified ligament of left ankle, initial encounter: Secondary | ICD-10-CM

## 2023-01-17 DIAGNOSIS — F3181 Bipolar II disorder: Secondary | ICD-10-CM

## 2023-01-17 DIAGNOSIS — F411 Generalized anxiety disorder: Secondary | ICD-10-CM

## 2023-01-17 DIAGNOSIS — M25572 Pain in left ankle and joints of left foot: Secondary | ICD-10-CM | POA: Diagnosis not present

## 2023-01-17 NOTE — ED Triage Notes (Signed)
Pt states she fell while holding a barbel and twisted her left ankle.

## 2023-01-17 NOTE — ED Provider Notes (Signed)
[facility]    CSN: [id number] Arrival date & time: 01/17/23  1851      History   Chief Complaint Chief Complaint  Patient presents with   Ankle Pain    HPI Victoria Simmons is a 27 y.o. female.   Patient reports that she was lifting a weight and the weight slipped patient reports that she fell twisting her ankle patient reports that she heard a loud pop.  Patient reports she has had difficulty putting weight on her foot and ankle since the time of the injury.  Patient denies any other areas of pain denies any other areas of injury  The history is provided by the patient. No language interpreter was used.  Ankle Pain   Past Medical History:  Diagnosis Date   Anxiety    Bipolar 2 disorder    Depression    Dysmenorrhea    Hypertriglyceridemia 2020   MDD (major depressive disorder) 06/19/2017   Ovarian torsion 2008    Patient Active Problem List   Diagnosis Date Noted   Cannabis dependence 01/14/2023   Influenza-like illness 12/25/2022   Nausea vomiting and diarrhea 09/14/2022   Syncope 09/14/2022   Left acute suppurative otitis media 03/14/2022   Acne 03/14/2022   Infected epidermoid cyst 11/03/2021   Reflux esophagitis 11/03/2021   Bloody diarrhea 04/06/2020   Generalized abdominal pain 04/06/2020   Well woman exam 06/19/2017   HLD (hyperlipidemia) 06/19/2017   Bipolar 2 disorder, major depressive episode 05/13/2017   GAD (generalized anxiety disorder) 05/13/2017   Menorrhagia 01/25/2014   Family history of hypercholesterolemia 01/06/2013   Murmur 01/06/2013    Past Surgical History:  Procedure Laterality Date   COLONOSCOPY  08/2020   polyp, int hem, benign biopsies ([facility])   ESOPHAGOGASTRODUODENOSCOPY  08/2020   LA grade A reflux esophagitis and antral gastritis, H pylori neg, treated with PPI BID and carafate course ([facility]   OVARY SURGERY  06/30/2007   dx laparoscopy, reduction of right adnexal torsion, bilateral oophoropexy of each  ovary to posterior uterine fundus    OB History     Gravida  0   Para  0   Term  0   Preterm  0   AB  0   Living  0      SAB  0   IAB  0   Ectopic  0   Multiple  0   Live Births  0            Home Medications    Prior to Admission medications   Medication Sig Start Date End Date Taking? Authorizing Provider  ARIPiprazole (ABILIFY) 10 MG tablet Take 1 tablet (10 mg total) by mouth at bedtime. 10/17/22   Darcel Smalling, MD  benzonatate (TESSALON) 100 MG capsule Take 1 capsule (100 mg total) by mouth 3 (three) times daily as needed for cough. Patient not taking: Reported on 01/08/2023 12/25/22   Eustaquio Boyden, MD  buPROPion (WELLBUTRIN XL) 150 MG 24 hr tablet Take 1 tablet (150 mg total) by mouth daily. 12/26/22   Darcel Smalling, MD  fluvoxaMINE (LUVOX) 100 MG tablet Take 2 tablets (200 mg total) by mouth at bedtime. 10/17/22   Darcel Smalling, MD  lamoTRIgine (LAMICTAL) 100 MG tablet Take 1.5 tablets (150 mg total) by mouth at bedtime. 08/15/22   Darcel Smalling, MD  Levonorgestrel (KYLEENA IU) by Intrauterine route. 02/21/18 02/22/23  [provider]  mupirocin cream (BACTROBAN) 2 % Apply 1 application. topically  2 (two) times daily. 03/14/22   Eustaquio BoydenGutierrez, Javier, MD  ondansetron (ZOFRAN-ODT) 4 MG disintegrating tablet Take 1 tablet (4 mg total) by mouth every 8 (eight) hours as needed for nausea or vomiting. 09/29/22   Jeanelle MallingLe, Ken, PA  oseltamivir (TAMIFLU) 75 MG capsule Take 1 capsule (75 mg total) by mouth 2 (two) times daily. Patient not taking: Reported on 01/08/2023 12/25/22   Eustaquio BoydenGutierrez, Javier, MD  traZODone (DESYREL) 100 MG tablet Take 1.5 tablets (150 mg total) by mouth at bedtime. 10/17/22   Darcel SmallingUmrania, Hiren M, MD  dicyclomine (BENTYL) 10 MG capsule Take 1 capsule (10 mg total) by mouth 4 (four) times daily as needed for spasms. Patient not taking: Reported on 07/26/2020 07/08/20 07/26/20  Arnaldo NatalKennedy-Smith, Colleen M, NP    Family History Family History   Problem Relation Age of Onset   Hepatitis B Mother    Anxiety disorder Mother    Hypertension Father    ADD / ADHD Brother    Ovarian cancer Paternal Grandmother     Social History Social History   Tobacco Use   Smoking status: Never   Smokeless tobacco: Never  Vaping Use   Vaping Use: Former  Substance Use Topics   Alcohol use: Yes    Comment: twice a month (5-7 drinks)   Drug use: Yes    Types: Marijuana    Comment: last use 16 Aug 2020     Allergies   Patient has no known allergies.   Review of Systems Review of Systems  All other systems reviewed and are negative.    Physical Exam Triage Vital Signs ED Triage Vitals  Enc Vitals Group     BP 01/17/23 1940 114/77     Pulse Rate 01/17/23 1940 97     Resp 01/17/23 1940 16     Temp 01/17/23 1940 98.5 F (36.9 C)     Temp Source 01/17/23 1940 Oral     SpO2 01/17/23 1940 96 %     Weight --      Height --      Head Circumference --      Peak Flow --      Pain Score 01/17/23 1939 10     Pain Loc --      Pain Edu? --      Excl. in GC? --    No data found.  Updated Vital Signs BP 114/77 (BP Location: Right Arm)   Pulse 97   Temp 98.5 F (36.9 C) (Oral)   Resp 16   LMP 01/06/2023 (Approximate)   SpO2 96%   Visual Acuity Right Eye Distance:   Left Eye Distance:   Bilateral Distance:    Right Eye Near:   Left Eye Near:    Bilateral Near:     Physical Exam Vitals and nursing note reviewed.  Constitutional:      Appearance: She is well-developed.  HENT:     Head: Normocephalic.  Cardiovascular:     Rate and Rhythm: Normal rate.  Pulmonary:     Effort: Pulmonary effort is normal.  Abdominal:     General: There is no distension.  Musculoskeletal:        General: Swelling and tenderness present.     Cervical back: Normal range of motion.     Comments: Slight swelling left ankle decreased range of motion neurovascular neurosensory are intact no gross instability  Skin:    General: Skin is  warm.  Neurological:     General: No focal deficit  present.     Mental Status: She is alert and oriented to person, place, and time.  Psychiatric:        Mood and Affect: Mood normal.      UC Treatments / Results  Labs (all labs ordered are listed, but only abnormal results are displayed) Labs Reviewed - No data to display  EKG   Radiology DG Ankle Complete Left  Result Date: 01/17/2023 CLINICAL DATA:  Pain. EXAM: LEFT ANKLE COMPLETE - 3+ VIEW COMPARISON:  None Available. FINDINGS: There is no evidence of fracture, dislocation, or joint effusion. There is no evidence of arthropathy or other focal bone abnormality. Soft tissues are unremarkable. IMPRESSION: Negative. Electronically Signed   By: Thornell Sartorius M.D.   On: 01/17/2023 20:02    Procedures Procedures (including critical care time)  Medications Ordered in UC Medications - No data to display  Initial Impression / Assessment and Plan / UC Course  I have reviewed the triage vital signs and the nursing notes.  Pertinent labs & imaging results that were available during my care of the patient were reviewed by me and considered in my medical decision making (see chart for details).     MDM: Leward Quan shows no evidence of fracture I suspect patient has a sprain patient placed in an ASO and given crutches patient advised to follow-up with Dr. Raynald Kemp orthopedist on-call if pain persist past 1 week Final Clinical Impressions(s) / UC Diagnoses   Final diagnoses:  Moderate ankle sprain, left, initial encounter   Discharge Instructions   None    ED Prescriptions   None    PDMP not reviewed this encounter. An After Visit Summary was printed and given to the patient.       Elson Areas, [place] 01/17/23 2009

## 2023-01-18 ENCOUNTER — Ambulatory Visit (INDEPENDENT_AMBULATORY_CARE_PROVIDER_SITE_OTHER): Payer: Medicaid Other | Admitting: Professional

## 2023-01-18 DIAGNOSIS — F1220 Cannabis dependence, uncomplicated: Secondary | ICD-10-CM

## 2023-01-18 DIAGNOSIS — F411 Generalized anxiety disorder: Secondary | ICD-10-CM

## 2023-01-18 DIAGNOSIS — F3181 Bipolar II disorder: Secondary | ICD-10-CM

## 2023-01-21 ENCOUNTER — Ambulatory Visit (INDEPENDENT_AMBULATORY_CARE_PROVIDER_SITE_OTHER): Payer: Medicaid Other | Admitting: Licensed Clinical Social Worker

## 2023-01-21 DIAGNOSIS — F3181 Bipolar II disorder: Secondary | ICD-10-CM | POA: Diagnosis not present

## 2023-01-21 DIAGNOSIS — F411 Generalized anxiety disorder: Secondary | ICD-10-CM

## 2023-01-22 ENCOUNTER — Other Ambulatory Visit (HOSPITAL_COMMUNITY): Payer: Self-pay

## 2023-01-22 ENCOUNTER — Ambulatory Visit (INDEPENDENT_AMBULATORY_CARE_PROVIDER_SITE_OTHER): Payer: Medicaid Other | Admitting: Licensed Clinical Social Worker

## 2023-01-22 ENCOUNTER — Other Ambulatory Visit: Payer: Self-pay | Admitting: Child and Adolescent Psychiatry

## 2023-01-22 DIAGNOSIS — F3181 Bipolar II disorder: Secondary | ICD-10-CM

## 2023-01-22 DIAGNOSIS — F411 Generalized anxiety disorder: Secondary | ICD-10-CM

## 2023-01-22 MED ORDER — BUPROPION HCL ER (XL) 150 MG PO TB24
150.0000 mg | ORAL_TABLET | Freq: Every day | ORAL | 0 refills | Status: DC
  Filled 2023-01-22: qty 30, 30d supply, fill #0

## 2023-01-23 ENCOUNTER — Other Ambulatory Visit (HOSPITAL_COMMUNITY): Payer: Self-pay

## 2023-01-23 ENCOUNTER — Other Ambulatory Visit: Payer: Self-pay

## 2023-01-23 ENCOUNTER — Ambulatory Visit (INDEPENDENT_AMBULATORY_CARE_PROVIDER_SITE_OTHER): Payer: Medicaid Other | Admitting: Licensed Clinical Social Worker

## 2023-01-23 DIAGNOSIS — F3181 Bipolar II disorder: Secondary | ICD-10-CM | POA: Diagnosis not present

## 2023-01-23 DIAGNOSIS — F411 Generalized anxiety disorder: Secondary | ICD-10-CM

## 2023-01-24 ENCOUNTER — Ambulatory Visit (INDEPENDENT_AMBULATORY_CARE_PROVIDER_SITE_OTHER): Payer: Medicaid Other | Admitting: Licensed Clinical Social Worker

## 2023-01-24 DIAGNOSIS — F411 Generalized anxiety disorder: Secondary | ICD-10-CM

## 2023-01-24 DIAGNOSIS — F3181 Bipolar II disorder: Secondary | ICD-10-CM

## 2023-01-24 DIAGNOSIS — F1220 Cannabis dependence, uncomplicated: Secondary | ICD-10-CM

## 2023-01-24 NOTE — Progress Notes (Signed)
Virtual Visit via Video Note  I connected with Victoria Simmons on 01/24/23 at  9:00 AM EDT by a video enabled telemedicine application and verified that I am speaking with the correct person using two identifiers.  Location: Patient: Home Provider: Office   I discussed the limitations of evaluation and management by telemedicine and the availability of in person appointments. The patient expressed understanding and agreed to proceed.      I discussed the assessment and treatment plan with the patient. The patient was provided an opportunity to ask questions and all were answered. The patient agreed with the plan and demonstrated an understanding of the instructions.   The patient was advised to call back or seek an in-person evaluation if the symptoms worsen or if the condition fails to improve as anticipated.    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 Daleen Bo since 2017 and transition to this writer in April 2020. She is currently prescribed the following medication regimen:   Current medication: Bipolar 2 disorder (chronic, stable) Continue with lamotrigine 150 mg daily.  Continue Luvox  200 mg p.o. daily Continue Abilify at  po qd. Continue trazodone  p.o. nightly  Patient reports she is only taking the trazodone at night all other medication she has moved to the AM. She reports that she is not sleeping throughout the night. She reports that she is not eating much and has been throwing up more lately.  She reports that she is having difficulty falling asleep and frequent night time awakening. She reports that she is having some anxiety about a upcoming hiking date, with a  women that she started seeing this week. Patinet also reports that she thinks her appetite has declined due to lack of interest in eating. She is taking Zofran BID right now.   Patient reports that her mood has been ok, she has some irritable but feels like this is when she is hungry. Patient denies SI, HI, and AVH.   THC: 2x/ day, but getting down to 1x/ day now. Not using before bed anymore.  Etoh: none the past week and weekend  Visit Diagnosis:    ICD-10-CM   1. Bipolar 2 disorder, major depressive episode  F31.81     2. GAD (generalized anxiety disorder)  F41.1     3. Cannabis dependence  F12.20       Past Psychiatric History:  OPT: Dr. Daleen Bo started at age 13 in the past and Dr. Jerold Coombe currently INPT: 2x, [facility] and [facility], for SA and SI respectively  SA (2018) via OD on prescribed medications, her mom is now responsible for putting her medication out BID Self- harm: Yes cutting wrists, beginning of 2023 was last time, had been doing intermittently since 2021. Cut b/c she felt worthless and making everyone around her mad. Patient endorsed that he did nothing for her Therapy: Yes in past, has current, no hx of IOP or PHP Medications: Prozac and Zoloft "Decreasing Luvox to 50 mg worsened her symptoms of anxiety therefore it was increased back to 100 mg once a day. Previous trials of Zoloft 150 mg and Prozac 10 mg stopped due to inefficacy "- per  Dr. Jerold Coombe   Hx of dx: Bipolar 2 disorder and GAD  Past Medical History:  Past Medical History:  Diagnosis Date   Anxiety    Bipolar 2 disorder    Depression    Dysmenorrhea    Hypertriglyceridemia 2020   MDD (major depressive disorder) 06/19/2017   Ovarian torsion 2008    Past Surgical History:  Procedure Laterality Date   COLONOSCOPY  08/2020   polyp, int hem, benign biopsies ([facility])   ESOPHAGOGASTRODUODENOSCOPY  08/2020   LA grade A reflux esophagitis and antral gastritis, H pylori neg, treated with PPI BID and carafate  course ([facility]   OVARY SURGERY  06/30/2007   dx laparoscopy, reduction of right adnexal torsion, bilateral oophoropexy of each ovary to posterior uterine fundus    Family Psychiatric History:    Anxiety: Mom,  M GMA: Eating disorder, Anxiety, depression M G- aunt: Bipolar d/o   M Uncle: Etoh use d/o Family History:  Family History  Problem Relation Age of Onset   Hepatitis B Mother    Anxiety disorder Mother    Hypertension Father    ADD / ADHD Brother    Ovarian cancer Paternal Grandmother     Social History:  Social History   Socioeconomic History   Marital status: Legally Separated    Spouse name: Not on file   Number of children: 0   Years of education: Not on file   Highest education level: Some college, no degree  Occupational History   Occupation: [employer]    Comment: full time  Tobacco Use   Smoking status: Never   Smokeless tobacco: Never  Vaping Use   Vaping Use: Former  Substance and Sexual Activity   Alcohol use: Yes    Comment: twice a month (5-7 drinks)   Drug use: Yes    Types: Marijuana    Comment: last use 16 Aug 2020   Sexual activity: Yes  Other Topics Concern   Not on file  Social History Narrative          Social Determinants of Health   Financial Resource Strain: Low Risk  (08/26/2017)   Overall Financial Resource Strain (CARDIA)    Difficulty of Paying Living Expenses: Not hard at all  Food Insecurity: No Food Insecurity (08/26/2017)   Hunger Vital Sign    Worried About Running Out of Food in the Last Year: Never true    Ran Out of Food in the Last Year: Never true  Transportation Needs: No Transportation Needs (08/26/2017)   PRAPARE - Administrator, Civil Service (Medical): No    Lack of Transportation (Non-Medical): No  Physical Activity: Sufficiently Active (08/26/2017)   Exercise Vital Sign    Days of Exercise per Week: 5 days    Minutes of Exercise per Session: 60 min  Stress: No Stress Concern  Present (08/26/2017)   [employer] of Occupational Health - Occupational Stress Questionnaire    Feeling of Stress : Not at all  Social Connections: Moderately Isolated (08/26/2017)   Social Connection and Isolation Panel [NHANES]    Frequency of Communication with Friends and Family: Three times a week    Frequency of Social Gatherings with Friends and Family: More than three times a week    Attends Religious Services: Never    Database administrator or Organizations: No    Attends Banker Meetings: Never    Marital Status: Never married    Allergies: No Known Allergies  Metabolic  Disorder Labs: Lab Results  Component Value Date   HGBA1C 5.0 07/26/2020   MPG 97 07/26/2020   MPG 94 05/14/2017   No results found for: "PROLACTIN" Lab Results  Component Value Date   CHOL 226 (H) 07/26/2020   TRIG 138 07/26/2020   HDL 44 07/26/2020   CHOLHDL 5.1 07/26/2020   VLDL 28 07/26/2020   LDLCALC 154 (H) 07/26/2020   LDLCALC 103 (H) 04/05/2020   Lab Results  Component Value Date   TSH 2.70 09/12/2021   TSH 2.401 07/26/2020    Therapeutic Level Labs: No results found for: "LITHIUM" No results found for: "VALPROATE" No results found for: "CBMZ"  Current Medications: Current Outpatient Medications  Medication Sig Dispense Refill   ARIPiprazole (ABILIFY) 10 MG tablet Take 1 tablet (10 mg total) by mouth at bedtime. 90 tablet 1   benzonatate (TESSALON) 100 MG capsule Take 1 capsule (100 mg total) by mouth 3 (three) times daily as needed for cough. (Patient not taking: Reported on 01/08/2023) 30 capsule 0   buPROPion (WELLBUTRIN XL) 150 MG 24 hr tablet Take 1 tablet (150 mg total) by mouth daily. 30 tablet 0   fluvoxaMINE (LUVOX) 100 MG tablet Take 2 tablets (200 mg total) by mouth at bedtime. 180 tablet 1   lamoTRIgine (LAMICTAL) 100 MG tablet Take 1.5 tablets (150 mg total) by mouth at bedtime. 135 tablet 1   Levonorgestrel (KYLEENA IU) by Intrauterine route.      mupirocin cream (BACTROBAN) 2 % Apply 1 application. topically 2 (two) times daily. 15 g 0   ondansetron (ZOFRAN-ODT) 4 MG disintegrating tablet Take 1 tablet (4 mg total) by mouth every 8 (eight) hours as needed for nausea or vomiting. 20 tablet 0   oseltamivir (TAMIFLU) 75 MG capsule Take 1 capsule (75 mg total) by mouth 2 (two) times daily. (Patient not taking: Reported on 01/08/2023) 10 capsule 0   traZODone (DESYREL) 100 MG tablet Take 1.5 tablets (150 mg total) by mouth at bedtime. 135 tablet 1   No current facility-administered medications for this visit.      Psychiatric Specialty Exam: Review of Systems  Psychiatric/Behavioral:  Positive for sleep disturbance. Negative for dysphoric mood, hallucinations and suicidal ideas.     Last menstrual period 01/06/2023.There is no height or weight on file to calculate BMI.  General Appearance: Casual  Eye Contact:  Good  Speech:  Clear and Coherent  Volume:  Normal  Mood:  Euthymic  Affect:  Appropriate  Thought Process:  Coherent  Orientation:  Full (Time, Place, and Person)  Thought Content:  Logical  Suicidal Thoughts:  No  Homicidal Thoughts:  No  Memory:  Immediate;   Good Recent;   Good  Judgement:  Fair  Insight:  Fair  Psychomotor Activity:  NA  Concentration:  Concentration: Good  Recall:  NA  Fund of Knowledge:Good  Language: Good  Akathisia:  No  Handed:    AIMS (if indicated):  not done  Assets:  Communication Skills Desire for Improvement Housing Leisure Time Resilience Social Support  ADL's:  Intact  Cognition: WNL  Sleep:  Good                                                        Screenings: AIMS    Flowsheet Row Admission (Discharged) from 05/13/2017 in [facility]  INPATIENT BEHAVIORAL MEDICINE  AIMS Total Score 0      AUDIT    Flowsheet Row [facility] Ad[facility] (Discharged) from [facility]13/2017 in [facility]  INPATIENT BEHAVIORAL MEDICINE  Alcohol Use Disorder Identification Test Final Score (AUDIT) 6 0      GAD-7    Flowsheet Row Counselor from 01/02/2023 in [facility] Counselor from 07/30/2022 in [facility] from 10/04/2021 in [facility] Office Visit from 04/05/2020 in [facility]  Total GAD-7 Score 15 11 15 7       PHQ2-9    Flowsheet Row Counselor from 01/08/2023 in [facility] Counselor from 01/02/2023 in [facility] Counselor from 07/30/2022 in [facility] [place] Counselor from 10/04/2021 in [facility] Office Visit from 06/15/2021 in [facility]  PHQ-2 Total Score 6 5 2 3 1   PHQ-9 Total Score 23 23 5 17  --      Flowsheet Row ED from 01/17/2023 in [facility] Counselor from 01/02/2023 in [facility] Counselor from 12/12/2022 in [facility] at [facility] RISK CATEGORY No Risk Error: Q3, 4, or 5 should not be populated when Q2 is No High Risk        Assessment and Plan:   Patient appears to be doing failry well in respect to her mood. She also continues to decrease her substance use which may be helping. Patient has had some events this week that could have easily led to decompensation, but she has not. She continues to struggle with sleep. Will attempt to address with decrease her trazodone as she may have too much serotonin activity with her daytime medications and her trazodone at 150mg .  Bipolar 2 disorder, current episode depressed vs substance-induced mood disorder GAD Possible hx of Anorexia nervosa, currently in remission Continue with lamotrigine 150  mg daily.  Continue Luvox  200 mg p.o. daily Continue Abilify at 10mg  po qd. Discontinue Wellbutrin XL 150 mg daily.  - decrease trazodone 100mg  QHS  Concern for cyclical vomiting syndrome Cannabis use disorder, severe -Patient willing to cut back on THC - Zofran no more than BID during active vomiting    EtOH use - decreasing hs not had in at least 1 week, will be mindful of intake this weekend  Collaboration of Care: Collaboration of Care: PHP  Patient/Guardian was advised Release of Information must be obtained prior to any record release in order to collaborate their care with an outside provider. Patient/Guardian was advised if they have not already done so to contact the registration department to sign all necessary forms in order for [name] to release information regarding their care.   Consent: Patient/Guardian gives verbal consent for treatment and assignment of benefits for services provided during this visit. Patient/Guardian expressed understanding and agreed to proceed.   PGY-3 Victoria Morton, MD 01/24/2023, 1:28 PM

## 2023-01-25 ENCOUNTER — Ambulatory Visit (INDEPENDENT_AMBULATORY_CARE_PROVIDER_SITE_OTHER): Payer: Medicaid Other | Admitting: Licensed Clinical Social Worker

## 2023-01-25 DIAGNOSIS — F3181 Bipolar II disorder: Secondary | ICD-10-CM | POA: Diagnosis not present

## 2023-01-25 DIAGNOSIS — F411 Generalized anxiety disorder: Secondary | ICD-10-CM

## 2023-01-25 NOTE — Progress Notes (Signed)
Spoke with patient via WebEx video call, used 2 identifiers to correctly identify patient. States that groups are good and she is enjoying them. Still looking for a job and has applied at multiple places. Stopped her Wellbutrin at the recommendation of the Psychiatrist since it was messing up her appetite.On scale 1-10 as 10 being worst she rates depression at 1 and anxiety at 5. Denies SI/HI or AV hallucinations. No issues or complaints. No side effects from medications.

## 2023-01-28 ENCOUNTER — Ambulatory Visit (INDEPENDENT_AMBULATORY_CARE_PROVIDER_SITE_OTHER): Payer: Medicaid Other | Admitting: Licensed Clinical Social Worker

## 2023-01-28 ENCOUNTER — Telehealth: Payer: Medicaid Other | Admitting: Child and Adolescent Psychiatry

## 2023-01-28 DIAGNOSIS — F3181 Bipolar II disorder: Secondary | ICD-10-CM | POA: Diagnosis not present

## 2023-01-28 DIAGNOSIS — F411 Generalized anxiety disorder: Secondary | ICD-10-CM

## 2023-01-29 ENCOUNTER — Other Ambulatory Visit (HOSPITAL_COMMUNITY): Payer: Self-pay

## 2023-01-29 ENCOUNTER — Ambulatory Visit (INDEPENDENT_AMBULATORY_CARE_PROVIDER_SITE_OTHER): Payer: Medicaid Other | Admitting: Licensed Clinical Social Worker

## 2023-01-29 DIAGNOSIS — F3181 Bipolar II disorder: Secondary | ICD-10-CM

## 2023-01-29 DIAGNOSIS — F411 Generalized anxiety disorder: Secondary | ICD-10-CM

## 2023-01-29 DIAGNOSIS — F1220 Cannabis dependence, uncomplicated: Secondary | ICD-10-CM

## 2023-01-29 MED ORDER — QUETIAPINE FUMARATE 100 MG PO TABS
100.0000 mg | ORAL_TABLET | Freq: Every day | ORAL | 0 refills | Status: DC
  Filled 2023-01-29: qty 30, 30d supply, fill #0

## 2023-01-29 NOTE — Progress Notes (Signed)
Virtual Visit via Video Note  I connected with Victoria Simmons on 01/29/23 at  9:00 AM EDT by a video enabled telemedicine application and verified that I am speaking with the correct person using two identifiers.  Location: Patient: Home Provider: Office   I discussed the limitations of evaluation and management by telemedicine and the availability of in person appointments. The patient expressed understanding and agreed to proceed.    I discussed the assessment and treatment plan with the patient. The patient was provided an opportunity to ask questions and all were answered. The patient agreed with the plan and demonstrated an understanding of the instructions.   The patient was advised to call back or seek an in-person evaluation if the symptoms worsen or if the condition fails to improve as anticipated.    Bobbye Morton, MD  [facility] MD/PA/NP OP Progress Note  01/29/2023 4:40 PM Victoria Simmons  MRN:  [id number]  Chief Complaint:  Chief Complaint  Patient presents with   Follow-up   HPI: [name] is a 27 year old Caucasian female with history of bipolar disorder type II, GAD, insomnia was seen by Dr. Daleen Bo since 2017 and transition to this writer in April 2020. She is currently prescribed the following medication regimen:   Patient reports that she tried to take the 50mg  Trazodone 50mg  QHS but did not find this beneficial. She still has frequent night time awakening where she will wake up every 2 hrs and then take 1hr to fall back asleep. She has not really had good sleep except for when she went hiking. She reports that she is not having racing thoughts and is trying to practice good sleep hygiene. Patient reports that her mood overall is a good place.   Patient does endorse a period of 4 days where she went without sleep but had very low energy and wanted to sleep. She was using THC at the time. Patient denies SI, HI, and AVH.   Patient endorses that her appetite is better she is  averaging 3 meals/day. Patient reports that her weekend went really well. She hiked about 12. . She reports that she drank 2 mimosas in the last 3 days. She endorses significant decrease in Etoh use. She reports that she is still at [facility] 1 blunt/ day she is still working on decreased use.    Visit Diagnosis:    ICD-10-CM   1. Bipolar 2 disorder, major depressive episode  F31.81 QUEtiapine (SEROQUEL) 100 MG tablet    2. Cannabis dependence  F12.20     3. GAD (generalized anxiety disorder)  F41.1       Past Psychiatric History: OPT: Dr. Daleen Bo started at age 32 in the past and Dr. Jerold Coombe currently INPT: 2x, [facility] and [facility], for SA and SI respectively  SA (2018) via OD on prescribed medications, her mom is now responsible for putting her medication out BID Self- harm: Yes cutting wrists, beginning of 2023 was last time, had been doing intermittently since 2021. Cut b/c she felt worthless and making everyone around her mad. Patient endorsed that he did nothing for her Therapy: Yes in past, has current, no hx of IOP or PHP Medications: Prozac and Zoloft "Decreasing Luvox to 50 mg worsened her symptoms of anxiety therefore it was increased back to 100 mg once a day. Previous trials of Zoloft 150 mg and Prozac 10 mg stopped due to inefficacy "- per Dr. Jerold Coombe   Hx of dx: Bipolar 2 disorder and GAD  Past Medical History:  Past Medical History:  Diagnosis Date   Anxiety    Bipolar 2 disorder    Depression    Dysmenorrhea    Hypertriglyceridemia 2020   MDD (major depressive disorder) 06/19/2017   Ovarian torsion 2008    Past Surgical History:  Procedure Laterality Date   COLONOSCOPY  08/2020   polyp, int hem, benign biopsies ([facility])   ESOPHAGOGASTRODUODENOSCOPY  08/2020   LA grade A reflux esophagitis and antral gastritis, H pylori neg, treated with PPI BID and carafate course ([facility]   OVARY SURGERY  06/30/2007   dx laparoscopy, reduction of right adnexal torsion, bilateral  oophoropexy of each ovary to posterior uterine fundus    Family Psychiatric History: Anxiety: Mom,  M GMA: Eating disorder, Anxiety, depression M G- aunt: Bipolar d/o   M Uncle: Etoh use d/o  Family History:  Family History  Problem Relation Age of Onset   Hepatitis B Mother    Anxiety disorder Mother    Hypertension Father    ADD / ADHD Brother    Ovarian cancer Paternal Grandmother     Social History:  Social History   Socioeconomic History   Marital status: Legally Separated    Spouse name: Not on file   Number of children: 0   Years of education: Not on file   Highest education level: Some college, no degree  Occupational History   Occupation: [employer]    Comment: full time  Tobacco Use   Smoking status: Never   Smokeless tobacco: Never  Vaping Use   Vaping Use: Former  Substance and Sexual Activity   Alcohol use: Yes    Comment: twice a month (5-7 drinks)   Drug use: Yes    Types: Marijuana    Comment: last use 16 Aug 2020   Sexual activity: Yes  Other Topics Concern   Not on file  Social History Narrative          Social Determinants of Health   Financial Resource Strain: Low Risk  (08/26/2017)   Overall Financial Resource Strain (CARDIA)    Difficulty of Paying Living Expenses: Not hard at all  Food Insecurity: No Food Insecurity (08/26/2017)   Hunger Vital Sign    Worried About Running Out of Food in the Last Year: Never true    Ran Out of Food in the Last Year: Never true  Transportation Needs: No Transportation Needs (08/26/2017)   PRAPARE - Administrator, Civil Service (Medical): No    Lack of Transportation (Non-Medical): No  Physical Activity: Sufficiently Active (08/26/2017)   Exercise Vital Sign    Days of Exercise per Week: 5 days    Minutes of Exercise per Session: 60 min  Stress: No Stress Concern Present (08/26/2017)   [employer] of Occupational Health - Occupational Stress Questionnaire    Feeling of  Stress : Not at all  Social Connections: Moderately Isolated (08/26/2017)   Social Connection and Isolation Panel [NHANES]    Frequency of Communication with Friends and Family: Three times a week    Frequency of Social Gatherings with Friends and Family: More than three times a week    Attends Religious Services: Never    Database administrator or Organizations: No    Attends Banker Meetings: Never    Marital Status: Never married    Allergies: No Known Allergies  Metabolic Disorder Labs: Lab Results  Component Value Date   HGBA1C 5.0 07/26/2020   MPG 97 07/26/2020  MPG 94 05/14/2017   No results found for: "PROLACTIN" Lab Results  Component Value Date   CHOL 226 (H) 07/26/2020   TRIG 138 07/26/2020   HDL 44 07/26/2020   CHOLHDL 5.1 07/26/2020   VLDL 28 07/26/2020   LDLCALC 154 (H) 07/26/2020   LDLCALC 103 (H) 04/05/2020   Lab Results  Component Value Date   TSH 2.70 09/12/2021   TSH 2.401 07/26/2020    Therapeutic Level Labs: No results found for: "LITHIUM" No results found for: "VALPROATE" No results found for: "CBMZ"  Current Medications: Current Outpatient Medications  Medication Sig Dispense Refill   QUEtiapine (SEROQUEL) 100 MG tablet Take 1 tablet (100 mg total) by mouth at bedtime. 30 tablet 0   benzonatate (TESSALON) 100 MG capsule Take 1 capsule (100 mg total) by mouth 3 (three) times daily as needed for cough. (Patient not taking: Reported on 01/08/2023) 30 capsule 0   buPROPion (WELLBUTRIN XL) 150 MG 24 hr tablet Take 1 tablet (150 mg total) by mouth daily. (Patient not taking: Reported on 01/25/2023) 30 tablet 0   fluvoxaMINE (LUVOX) 100 MG tablet Take 2 tablets (200 mg total) by mouth at bedtime. 180 tablet 1   lamoTRIgine (LAMICTAL) 100 MG tablet Take 1.5 tablets (150 mg total) by mouth at bedtime. 135 tablet 1   Levonorgestrel (KYLEENA IU) by Intrauterine route.     mupirocin cream (BACTROBAN) 2 % Apply 1 application. topically 2 (two)  times daily. 15 g 0   ondansetron (ZOFRAN-ODT) 4 MG disintegrating tablet Take 1 tablet (4 mg total) by mouth every 8 (eight) hours as needed for nausea or vomiting. 20 tablet 0   oseltamivir (TAMIFLU) 75 MG capsule Take 1 capsule (75 mg total) by mouth 2 (two) times daily. (Patient not taking: Reported on 01/08/2023) 10 capsule 0   traZODone (DESYREL) 100 MG tablet Take 1.5 tablets (150 mg total) by mouth at bedtime. 135 tablet 1   No current facility-administered medications for this visit.      Psychiatric Specialty Exam: Review of Systems  Psychiatric/Behavioral:  Positive for sleep disturbance. Negative for dysphoric mood, hallucinations and suicidal ideas.     Last menstrual period 01/06/2023.There is no height or weight on file to calculate BMI.   General Appearance: Casual  Eye Contact:  Good  Speech:  Clear and Coherent  Volume:  Normal  Mood:  Euthymic  Affect:  Appropriate  Thought Process:  Coherent  Orientation:  Full (Time, Place, and Person)  Thought Content:  Logical  Suicidal Thoughts:  No  Homicidal Thoughts:  No  Memory:  Immediate;   Good Recent;   Good  Judgement:  Fair  Insight:  Fair  Psychomotor Activity:  NA  Concentration:  Concentration: Good  Recall:  NA  Fund of Knowledge:Good  Language: Good  Akathisia:  No  Handed:    AIMS (if indicated):  not done  Assets:  Communication Skills Desire for Improvement Housing Leisure Time Resilience Social Support  ADL's:  Intact  Cognition: WNL  Sleep:  Good                                                       Screenings: AIMS    Flowsheet Row Admission (Discharged) from 05/13/2017 in [facility]INE  AIMS Total Score 0      AUDIT  Flowsheet Row [facility] Admission (Discharged) from 05/13/2017 in [facility] BE[facility]AV[facility]AL MEDICINE  Alcohol Use Disorder Identification Test  Final Score (AUDIT) 6 0      GAD-7    Flowsheet Row Counselor from 01/02/2023 in [facility] Counselor from 07/30/2022 in [facility] from 10/04/2021 in [facility] Office Visit from 04/05/2020 in [facility]  Total GAD-7 Score 15 11 15 7       [phone]    Flowsheet Row Counselor from 01/08/2023 in [facility] Counselor from 01/02/2023 in [facility] Counselor from 07/30/2022 in [facility] [place] Counselor from 10/04/2021 in [facility] Office Visit from 06/15/2021 in [facility]  PHQ-2 Total Score 6 5 2 3 1   PHQ-9 Total Score 23 23 5 17  --      Flowsheet Row ED from 01/17/2023 in [facility] Counselor from 01/02/2023 in [facility] Counselor from 12/12/2022 in [facility] at [facility] RISK CATEGORY No Risk Error: Q3, 4, or 5 should not be populated when Q2 is No High Risk        Assessment and Plan:  Based on assessment patient's mood appears to be stable however she is still having significant trouble sleeping, we will change Abilify to Seroquel as it can help with mood stabilization is also a bit more sedating.  We will start at an equivalent dose of 100 mg of Seroquel due to patient's reported history of bipolar disorder and upon screening today while she does not endorse decreased need for sleep with high level energy she does endorse a history of going for days without sleep while wanting to sleep.  Patient is also on a relatively high dose of Luvox, would therefore like to continue Lamictal and start Seroquel at a dose equivalent to Abilify 10 mg. Bipolar 2  disorder, current episode depressed vs substance-induced mood disorder GAD Possible hx of Anorexia nervosa, currently in remission  - Dc the Abilify 10mg  daily - Start Seroquel 100mg  QHS Continue with lamotrigine 150 mg daily.  Continue Luvox  200 mg p.o. daily - Change Trazodone 50mg  QHS PRN  Concern for cyclical vomiting syndrome Cannabis use disorder, severe -Patient willing to cut back on THC - Zofran no more than BID during active vomiting    EtOH use -Patient has not engaged in binging behaviors in the last 2 weeks  Mom still has her medicines  Collaboration of Care: Collaboration of Care:   Patient/Guardian was advised Release of Information must be obtained prior to any record release in order to collaborate their care with an outside provider. Patient/Guardian was advised if they have not already done so to contact the registration department to sign all necessary forms in order for [name] to release information regarding their care.   Consent: Patient/Guardian gives verbal consent for treatment and assignment of benefits for services provided during this visit. Patient/Guardian expressed understanding and agreed to proceed.   PGY-3 Bobbye Morton, MD 01/29/2023, 4:40 PM

## 2023-01-30 ENCOUNTER — Ambulatory Visit (INDEPENDENT_AMBULATORY_CARE_PROVIDER_SITE_OTHER): Payer: Medicaid Other | Admitting: Licensed Clinical Social Worker

## 2023-01-30 DIAGNOSIS — F3181 Bipolar II disorder: Secondary | ICD-10-CM

## 2023-01-30 DIAGNOSIS — F411 Generalized anxiety disorder: Secondary | ICD-10-CM

## 2023-01-31 ENCOUNTER — Ambulatory Visit (INDEPENDENT_AMBULATORY_CARE_PROVIDER_SITE_OTHER): Payer: Medicaid Other | Admitting: Licensed Clinical Social Worker

## 2023-01-31 DIAGNOSIS — F411 Generalized anxiety disorder: Secondary | ICD-10-CM

## 2023-01-31 DIAGNOSIS — F3181 Bipolar II disorder: Secondary | ICD-10-CM | POA: Diagnosis not present

## 2023-01-31 NOTE — Progress Notes (Signed)
Virtual Visit via Video Note  I connected with [name] KOLARIK on 01/31/23 at  9:00 AM EDT by a video enabled telemedicine application and verified that I am speaking with the correct person using two identifiers.  Location: Patient: Home Provider: Office   I discussed the limitations of evaluation and management by telemedicine and the availability of in person appointments. The patient expressed understanding and agreed to proceed.    I discussed the assessment and treatment plan with the patient. The patient was provided an opportunity to ask questions and all were answered. The patient agreed with the plan and demonstrated an understanding of the instructions.   The patient was advised to call back or seek an in-person evaluation if the symptoms worsen or if the condition fails to improve as anticipated.     Bobbye Morton, MD  [facility] Partial Hospitalization Program Psych Discharge Summary  ANDRINA LOCKEN [id number]  Admission date: 01/08/2023 Discharge date: 02/01/2023  Reason for admission: Depression and Anxiety  Progress in Program Toward Treatment Goals: Progressing  Progress (rationale):   Patient reports that she is doing well, she has not picked up the Seroquel yet. Patient reports that she learned a lot about coping skills and setting boundaries. She also reports that she has learned how to set goals and not compare herself to others. She reports that the experience has overall been positive. She has also cut back on THC use and will continue to work on this. She has also stopped binge drinking pattern.   She reports that she feels less"spacey" since being in the program and more aware overall. Patient endorses that she is getting 7--9h of sleep but still has frequent night time awakenings. She reports good appetite as well.   Patient denies SI, HI, and AVH. She reports improvement in her anxiety endorsing that she feels it has been minimal for some time. She reports  that her mood is a 8-9/10, with 10 being the best, she endorses that she was probably a 10 yesterday, but a but more tired today. 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  Psychiatric Specialty Exam:   Review of Systems  Psychiatric/Behavioral:  Positive for sleep disturbance. Negative for decreased concentration, dysphoric mood, hallucinations and suicidal ideas. The patient is not nervous/anxious.     Last menstrual period 01/06/2023.There is no height or weight on file to calculate BMI.  General Appearance: Casual  Eye Contact:  Good  Speech:  Clear and Coherent  Volume:  Normal  Mood:  Euthymic  Affect:  Congruent  Thought Process:  Coherent  Orientation:  Full (Time, Place, and Person)  Thought Content:  Logical  Suicidal Thoughts:  No  Homicidal Thoughts:  No  Memory:  Immediate;   Good Recent;   Good  Judgement:  Good  Insight:  Good  Psychomotor Activity:  Normal  Concentration:  Concentration: Good  Recall:  NA  Fund of Knowledge:  Good  Language:  Good  Akathisia:  NA  Handed:    AIMS (if indicated):     Assets:  Communication Skills Desire for Improvement Housing Leisure Time Resilience Social Support  ADL's:  Intact  Cognition:  WNL  Sleep:   Fair     Discharge Plan: Dr. Craig Guess 02/07/2023 and her therapist Weyman Pedro, LCSW  Bipolar 2 disorder, current episode depressed vs substance-induced mood disorder GAD Possible hx of Anorexia nervosa, currently in remission   - Dc  the Abilify  daily - Start Seroquel  QHS -Continue with lamotrigine 150 mg daily.  -Continue Luvox  200 mg p.o. daily - Continue Trazodone  QHS PRN   Concern for cyclical vomiting syndrome Cannabis use disorder, severe -Patient continue to cut back on THC - Zofran no more than BID during active vomiting    EtOH use -Patient has not engaged in binging behaviors in the last 2 weeks   Mom still has her  medicines   - No refills needed  Collaboration of Care:   Patient/Guardian was advised Release of Information must be obtained prior to any record release in order to collaborate their care with an outside provider. Patient/Guardian was advised if they have not already done so to contact the registration department to sign all necessary forms in order for [name] to release information regarding their care.   Consent: Patient/Guardian gives verbal consent for treatment and assignment of benefits for services provided during this visit. Patient/Guardian expressed understanding and agreed to proceed.   Eliseo Gum, MD PGY-3 [facility]-PHP THERAPIST 01/31/2023

## 2023-01-31 NOTE — Psych (Signed)
Virtual Visit via Video Note  I connected with Victoria Simmons on 01/18/23 at  9:00 AM EDT by a video enabled telemedicine application and verified that I am speaking with the correct person using two identifiers.  Location: Patient: pt's home Provider: clinical home office   I discussed the limitations of evaluation and management by telemedicine and the availability of in person appointments. The patient expressed understanding and agreed to proceed.   I discussed the assessment and treatment plan with the patient. The patient was provided an opportunity to ask questions and all were answered. The patient agreed with the plan and demonstrated an understanding of the instructions.   The patient was advised to call back or seek an in-person evaluation if the symptoms worsen or if the condition fails to improve as anticipated.  Pt was provided 240 minutes of non-face-to-face time during this encounter.   Quinn Axe, [facility]   [facility] PHP THERAPIST PROGRESS NOTE  Victoria Simmons [id number]   Session Time: 9:00 am - 10:00 am  Participation Level: Active  Behavioral Response: CasualAlertAnxious and Depressed  Type of Therapy: Group Therapy  Treatment Goals addressed: Coping  Progress Towards Goals: Progressing  Interventions: CBT, DBT, Solution Focused, Strength-based, Supportive, and Reframing  Therapist Response: Clinician led check-in regarding current stressors and situation, and review of patient completed daily inventory. Clinician utilized active listening and empathetic response and validated patient emotions. Clinician facilitated processing group on pertinent issues.?   Summary: Patient arrived within time allowed. Patient rates her mood at a 7 on a scale of 1-10 with 10 being best. Pt reports feeling "okay." Pt states she slept 6-7 hours and ate 1x. Pt reports she found out her ankle is "severely sprained". Pt shares her cousin was mad at her and said pt holds her  (cousin) back at the gym. Pt reports it hurt her self-esteem and confidence to continue going to the gym. Pt able to process.?Pt engaged in discussion.?        Session Time: 10:00 am - 11:00 am   Participation Level: Active   Behavioral Response: CasualAlertDepressed   Type of Therapy: Group Therapy   Treatment Goals addressed: Coping   Progress Towards Goals: Progressing   Interventions: CBT, DBT, Solution Focused, Strength-based, Supportive, and Reframing   Therapist Response: Cln led discussion on anticipatory anxiety. Group discussed coping skills to manage anticipatory anxiety, such as checking the facts, planning ahead, and grounding techniques to use in the moment. Pts shared what causes anticipatory anxiety for self.   Therapist Response: Pt engaged in discussion and reports gaining insight.          Session Time: 11:00 -12:00   Participation Level: Active   Behavioral Response: CasualAlertDepressed   Type of Therapy: Group Therapy   Treatment Goals addressed: Coping   Progress Towards Goals: Progressing   Interventions: CBT, DBT, Solution Focused, Strength-based, Supportive, and Reframing   Summary: Clinician introduced topic of "Positive Psychology". Group watched "Positive Psychology" Ted-Talk. Patients discussed how their "lens" of life affects the way they feel. Group discussed 5 strategies to help change lens. Patients identified one strategy they would be willing to try to change their "lens" for at least 21 days to create a new habit.   Therapist Response: Pt engaged in discussion and shares she will try positive journaling before bed each night to help change her lens.    Session Time: 12:00 pm - 1:00 pm  Participation Level: Active  Behavioral Response: CasualAlertAnxious and Depressed  Type of Therapy: Group Therapy  Treatment Goals addressed: Coping  Progress Towards Goals: Progressing  Interventions: CBT, DBT, Solution Focused,  Strength-based, Supportive, and Reframing  Therapist Response: 12:00 - 12:50 pm: OT group led by cln E. Hollan 12:50 - 1:00 pm: Clinician led check-out. Clinician assessed for immediate needs, medication compliance and efficacy, and safety concerns?  Summary: 12:00 - 12:50 pm: Pt participated 12:50 - 1:00 pm: At check-out, patient reports no immediate concerns.?Patient demonstrates progress as evidenced by her continued engagement and by being receptive to treatment. Pt denies active SI/HI/self-harm thoughts at the end of group.  Suicidal/Homicidal: Nowithout intent/plan  Plan: ?Pt will continue in PHP and medication management while continuing to work on decreasing depression symptoms,?SI, and anxiety symptoms,?and increasing the ability to self manage symptoms.    Collaboration of Care: Medication Management AEB Dr. Morrie Sheldon  Patient/Guardian was advised Release of Information must be obtained prior to any record release in order to collaborate their care with an outside provider. Patient/Guardian was advised if they have not already done so to contact the registration department to sign all necessary forms in order for [name] to release information regarding their care.   Consent: Patient/Guardian gives verbal consent for treatment and assignment of benefits for services provided during this visit. Patient/Guardian expressed understanding and agreed to proceed.   Diagnosis: Bipolar 2 disorder, major depressive episode [F31.81]    1. Bipolar 2 disorder, major depressive episode   2. GAD (generalized anxiety disorder)   3. Cannabis dependence       Quinn Axe, [facility] 01/18/23

## 2023-02-01 ENCOUNTER — Encounter (HOSPITAL_COMMUNITY): Payer: Self-pay

## 2023-02-01 ENCOUNTER — Other Ambulatory Visit (HOSPITAL_COMMUNITY): Payer: Self-pay

## 2023-02-01 ENCOUNTER — Ambulatory Visit (HOSPITAL_COMMUNITY): Payer: Medicaid Other

## 2023-02-01 ENCOUNTER — Ambulatory Visit (INDEPENDENT_AMBULATORY_CARE_PROVIDER_SITE_OTHER): Payer: Medicaid Other | Admitting: Licensed Clinical Social Worker

## 2023-02-01 DIAGNOSIS — F3181 Bipolar II disorder: Secondary | ICD-10-CM

## 2023-02-01 DIAGNOSIS — F411 Generalized anxiety disorder: Secondary | ICD-10-CM

## 2023-02-01 NOTE — Progress Notes (Unsigned)
Spoke with patient via WebEx video call, used 2 identifiers to correctly identify patient. States that groups are going well. Today is her last day and she will return to regular therapy. On scale 1-10 as 10 being worst she rates depression at 1 and anxiety at 1. Denies SI/HI or AV hallucinations. PHQ9=2. No side effects from medications. No issues or complaints.

## 2023-02-04 ENCOUNTER — Ambulatory Visit (HOSPITAL_COMMUNITY): Payer: Medicaid Other

## 2023-02-04 NOTE — Psych (Signed)
Virtual Visit via Video Note  I connected with Victoria Simmons on 01/16/23 at  9:00 AM EDT by a video enabled telemedicine application and verified that I am speaking with the correct person using two identifiers.  Location: Patient: pt's home Provider: clinical home office   I discussed the limitations of evaluation and management by telemedicine and the availability of in person appointments. The patient expressed understanding and agreed to proceed.   I discussed the assessment and treatment plan with the patient. The patient was provided an opportunity to ask questions and all were answered. The patient agreed with the plan and demonstrated an understanding of the instructions.   The patient was advised to call back or seek an in-person evaluation if the symptoms worsen or if the condition fails to improve as anticipated.  Pt was provided 240 minutes of non-face-to-face time during this encounter.   Donia Guiles, LCSW   [facility] PHP THERAPIST PROGRESS NOTE  Victoria Simmons [id number]   Session Time: 9:00 am - 10:00 am  Participation Level: Active  Behavioral Response: CasualAlertAnxious and Depressed  Type of Therapy: Group Therapy  Treatment Goals addressed: Coping  Progress Towards Goals: Progressing  Interventions: CBT, DBT, Solution Focused, Strength-based, Supportive, and Reframing  Therapist Response: Clinician led check-in regarding current stressors and situation, and review of patient completed daily inventory. Clinician utilized active listening and empathetic response and validated patient emotions. Clinician facilitated processing group on pertinent issues.?   Summary: Patient arrived within time allowed. Patient rates her mood at a 8 on a scale of 1-10 with 10 being best. Pt reports feeling "good." Pt states she slept 5.5 hours and ate 2x. Pt states she went to the gym with her cousin this morning and is feeling good about herself for getting it accomplished.  Pt states spending time with her cousin yesterday as well and that the socialization had been helpful. Pt able to process.?Pt engaged in discussion.?        Session Time: 10:00 am - 11:00 am  Participation Level: Active  Behavioral Response: CasualAlertAnxious and Depressed  Type of Therapy: Group Therapy  Treatment Goals addressed: Coping  Progress Towards Goals: Progressing  Interventions: CBT, DBT, Solution Focused, Strength-based, Supportive, and Reframing  Therapist Response: Cln led processing group for pt's current struggles. Group members shared stressors and provided support and feedback. Cln brought in topics of boundaries, healthy relationships, and unhealthy thought processes to inform discussion.    Therapist Response: Pt able to process and provide support to group.          Session Time: 11:00 -12:00   Participation Level: Active   Behavioral Response: CasualAlertDepressed   Type of Therapy: Group Therapy, Spiritual Care   Treatment Goals addressed: Coping   Progress Towards Goals: Progressing   Interventions: Supportive, Education   Summary:  [name], Chaplain, led group.   Therapist Response: Pt participated     Session Time: 12:00 pm - 1:00 pm  Participation Level: Active  Behavioral Response: CasualAlertAnxious and Depressed  Type of Therapy: Group Therapy  Treatment Goals addressed: Coping  Progress Towards Goals: Progressing  Interventions: CBT, DBT, Solution Focused, Strength-based, Supportive, and Reframing  Therapist Response: 12:00 - 12:50 pm: OT group led by cln E. Hollan 12:50 - 1:00 pm: Clinician led check-out. Clinician assessed for immediate needs, medication compliance and efficacy, and safety concerns?  Summary: 12:00 - 12:50 pm: Pt participated 12:50 - 1:00 pm: At check-out, patient reports no immediate concerns.?Patient demonstrates progress as evidenced  by her continued engagement and by being receptive to  treatment. Pt denies active SI/HI/self-harm thoughts at the end of group.  Suicidal/Homicidal: Nowithout intent/plan  Plan: ?Pt will continue in PHP and medication management while continuing to work on decreasing depression symptoms,?SI, and anxiety symptoms,?and increasing the ability to self manage symptoms.    Collaboration of Care: Medication Management AEB Dr. Morrie Sheldon  Patient/Guardian was advised Release of Information must be obtained prior to any record release in order to collaborate their care with an outside provider. Patient/Guardian was advised if they have not already done so to contact the registration department to sign all necessary forms in order for [name] to release information regarding their care.   Consent: Patient/Guardian gives verbal consent for treatment and assignment of benefits for services provided during this visit. Patient/Guardian expressed understanding and agreed to proceed.   Diagnosis: Bipolar 2 disorder, major depressive episode (HCC) [F31.81]    1. Bipolar 2 disorder, major depressive episode (HCC)   2. GAD (generalized anxiety disorder)       Donia Guiles, LCSW

## 2023-02-04 NOTE — Psych (Signed)
Virtual Visit via Video Note  I connected with Victoria Simmons on 01/17/23 at  9:00 AM EDT by a video enabled telemedicine application and verified that I am speaking with the correct person using two identifiers.  Location: Patient: pt's home Provider: clinical home office   I discussed the limitations of evaluation and management by telemedicine and the availability of in person appointments. The patient expressed understanding and agreed to proceed.   I discussed the assessment and treatment plan with the patient. The patient was provided an opportunity to ask questions and all were answered. The patient agreed with the plan and demonstrated an understanding of the instructions.   The patient was advised to call back or seek an in-person evaluation if the symptoms worsen or if the condition fails to improve as anticipated.  Pt was provided 240 minutes of non-face-to-face time during this encounter.   Donia Guiles, LCSW   [facility] PHP THERAPIST PROGRESS NOTE  Victoria Simmons [id number]   Session Time: 9:00 am - 10:00 am  Participation Level: Active  Behavioral Response: CasualAlertAnxious and Depressed  Type of Therapy: Group Therapy  Treatment Goals addressed: Coping  Progress Towards Goals: Progressing  Interventions: CBT, DBT, Solution Focused, Strength-based, Supportive, and Reframing  Therapist Response: Clinician led check-in regarding current stressors and situation, and review of patient completed daily inventory. Clinician utilized active listening and empathetic response and validated patient emotions. Clinician facilitated processing group on pertinent issues.?   Summary: Patient arrived within time allowed. Patient rates her mood at a 5 on a scale of 1-10 with 10 being best. Pt reports feeling "pretty bad." Pt states she slept 3.5 hours and ate 1x. Pt states she hurt her ankle at the gym and thinks it may be sprained. Pt is tearful and shares she had an issue with  her cousin and has been spiraling since. Pt states her cousin hurt her feelings and sore spots in her self-esteem. Pt able to process.?Pt engaged in discussion.?        Session Time: 10:00 am - 11:00 am  Participation Level: Active  Behavioral Response: CasualAlertAnxious and Depressed  Type of Therapy: Group Therapy  Treatment Goals addressed: Coping  Progress Towards Goals: Progressing  Interventions: CBT, DBT, Solution Focused, Strength-based, Supportive, and Reframing  Therapist Response: Cln led discussion on communicating our struggles with our support team. Cln introduced the concept of giving disclosures to the people close to [name] regarding the way we act in specific situations or the way we process certain stimuli. Group members discussed ways to provide this "road map to our brain" and in what situations this may be helpful to them.   Therapist Response: Pt engaged in discussion and is able to determine situations in which providing a disclosure can be helpful and practiced doing so.          Session Time: 11:00 -12:00   Participation Level: Active   Behavioral Response: CasualAlertDepressed   Type of Therapy: Group Therapy   Treatment Goals addressed: Coping   Progress Towards Goals: Progressing   Interventions: CBT, DBT, Solution Focused, Strength-based, Supportive, and Reframing   Summary: Cln introduced the "Thoughts" distraction skills from DBT distress tolerance skill ACCEPTS. Cln discussed how this set of distraction skills can be helpful when in situations where outside resources are limited or unavailable. Group practiced and brainstormed ways to apply the Thought skill.    Therapist Response:  Pt engaged in discussion and reports ways to utilize the Thought skill.  Session Time: 12:00 pm - 1:00 pm  Participation Level: Active  Behavioral Response: CasualAlertAnxious and Depressed  Type of Therapy: Group Therapy  Treatment Goals addressed:  Coping  Progress Towards Goals: Progressing  Interventions: CBT, DBT, Solution Focused, Strength-based, Supportive, and Reframing  Therapist Response: 12:00 - 12:50 pm: OT group led by cln E. Hollan 12:50 - 1:00 pm: Clinician led check-out. Clinician assessed for immediate needs, medication compliance and efficacy, and safety concerns?  Summary: 12:00 - 12:50 pm: Pt participated 12:50 - 1:00 pm: At check-out, patient reports no immediate concerns.?Patient demonstrates progress as evidenced by her continued engagement and by being receptive to treatment. Pt denies active SI/HI/self-harm thoughts at the end of group.  Suicidal/Homicidal: Nowithout intent/plan  Plan: ?Pt will continue in PHP and medication management while continuing to work on decreasing depression symptoms,?SI, and anxiety symptoms,?and increasing the ability to self manage symptoms.    Collaboration of Care: Medication Management AEB Dr. Morrie Sheldon  Patient/Guardian was advised Release of Information must be obtained prior to any record release in order to collaborate their care with an outside provider. Patient/Guardian was advised if they have not already done so to contact the registration department to sign all necessary forms in order for [name] to release information regarding their care.   Consent: Patient/Guardian gives verbal consent for treatment and assignment of benefits for services provided during this visit. Patient/Guardian expressed understanding and agreed to proceed.   Diagnosis: Bipolar 2 disorder, major depressive episode (HCC) [F31.81]    1. Bipolar 2 disorder, major depressive episode (HCC)   2. GAD (generalized anxiety disorder)       Donia Guiles, LCSW

## 2023-02-04 NOTE — Psych (Signed)
Virtual Visit via Video Note  I connected with Victoria Simmons on 01/15/23 at  9:00 AM EDT by a video enabled telemedicine application and verified that I am speaking with the correct person using two identifiers.  Location: Patient: pt's home Provider: clinical home office   I discussed the limitations of evaluation and management by telemedicine and the availability of in person appointments. The patient expressed understanding and agreed to proceed.   I discussed the assessment and treatment plan with the patient. The patient was provided an opportunity to ask questions and all were answered. The patient agreed with the plan and demonstrated an understanding of the instructions.   The patient was advised to call back or seek an in-person evaluation if the symptoms worsen or if the condition fails to improve as anticipated.  Pt was provided 240 minutes of non-face-to-face time during this encounter.   Donia Guiles, LCSW   [facility] PHP THERAPIST PROGRESS NOTE  Victoria Simmons [id number]   Session Time: 9:00 am - 10:00 am  Participation Level: Active  Behavioral Response: CasualAlertAnxious and Depressed  Type of Therapy: Group Therapy  Treatment Goals addressed: Coping  Progress Towards Goals: Progressing  Interventions: CBT, DBT, Solution Focused, Strength-based, Supportive, and Reframing  Therapist Response: Clinician led check-in regarding current stressors and situation, and review of patient completed daily inventory. Clinician utilized active listening and empathetic response and validated patient emotions. Clinician facilitated processing group on pertinent issues.?   Summary: Patient arrived within time allowed. Patient rates her mood at a 7 on a scale of 1-10 with 10 being best. Pt reports feeling "better than yesterday." Pt states she slept 6 hours and ate 2. Pt states she began taking her medication on a new schedule per psychiatrist with no difficulties. Pt reports  she she spent time with her cousin yesterday and got a new piercing, which is a common behavior for her when she is struggling with mood. Pt identifies passive SI thoughts and states she was able to manage the thoughts. Pt denies plan or intent. Pt able to process.?Pt engaged in discussion.?        Session Time: 10:00 am - 11:00 am  Participation Level: Active  Behavioral Response: CasualAlertAnxious and Depressed  Type of Therapy: Group Therapy  Treatment Goals addressed: Coping  Progress Towards Goals: Progressing  Interventions: CBT, DBT, Solution Focused, Strength-based, Supportive, and Reframing  Therapist Response:  Cln led discussion on ways to reinforce new habits. Group shared ways in which they can set themselves up for good habits. Cln encouraged reminders in phone, post-it notes, educating support system, and practicing in low-stress environments.    Therapist Response: Pt engaged in discussion and is able to determine ways to reinforce positive habits.          Session Time: 11:00 -12:00   Participation Level: Active   Behavioral Response: CasualAlertDepressed   Type of Therapy: Group Therapy   Treatment Goals addressed: Coping   Progress Towards Goals: Progressing   Interventions: CBT, DBT, Solution Focused, Strength-based, Supportive, and Reframing   Summary: Cln introduced DBT distress tolerance distraction skills. Cln provides context for distraction skills and why distraction is a foundational way to manage mood dysregulation. Cln utilized DBT ACCEPTS skills as a way to distract.  Group discussed how to apply distraction.    Therapist Response:  Pt engaged in discussion and reports understanding of how distraction can be applied to manage big feelings.      Session Time: 12:00 pm -  1:00 pm  Participation Level: Active  Behavioral Response: CasualAlertAnxious and Depressed  Type of Therapy: Group Therapy  Treatment Goals addressed:  Coping  Progress Towards Goals: Progressing  Interventions: CBT, DBT, Solution Focused, Strength-based, Supportive, and Reframing  Therapist Response: 12:00 - 12:50 pm: OT group led by cln E. Hollan 12:50 - 1:00 pm: Clinician led check-out. Clinician assessed for immediate needs, medication compliance and efficacy, and safety concerns?  Summary: 12:00 - 12:50 pm: Pt participated 12:50 - 1:00 pm: At check-out, patient reports no immediate concerns.?Patient demonstrates progress as evidenced by her continued engagement and by being receptive to treatment. Pt denies active SI/HI/self-harm thoughts at the end of group.  Suicidal/Homicidal: Yeswithout intent/plan  Plan: ?Pt will continue in PHP and medication management while continuing to work on decreasing depression symptoms,?SI, and anxiety symptoms,?and increasing the ability to self manage symptoms.    Collaboration of Care: Medication Management AEB Dr. Morrie Sheldon  Patient/Guardian was advised Release of Information must be obtained prior to any record release in order to collaborate their care with an outside provider. Patient/Guardian was advised if they have not already done so to contact the registration department to sign all necessary forms in order for [name] to release information regarding their care.   Consent: Patient/Guardian gives verbal consent for treatment and assignment of benefits for services provided during this visit. Patient/Guardian expressed understanding and agreed to proceed.   Diagnosis: Bipolar 2 disorder, major depressive episode (HCC) [F31.81]    1. Bipolar 2 disorder, major depressive episode (HCC)   2. GAD (generalized anxiety disorder)       Donia Guiles, LCSW

## 2023-02-04 NOTE — Psych (Signed)
Virtual Visit via Video Note  I connected with Victoria Simmons on 01/21/23 at  9:00 AM EDT by a video enabled telemedicine application and verified that I am speaking with the correct person using two identifiers.  Location: Patient: pt's home Provider: clinical home office   I discussed the limitations of evaluation and management by telemedicine and the availability of in person appointments. The patient expressed understanding and agreed to proceed.   I discussed the assessment and treatment plan with the patient. The patient was provided an opportunity to ask questions and all were answered. The patient agreed with the plan and demonstrated an understanding of the instructions.   The patient was advised to call back or seek an in-person evaluation if the symptoms worsen or if the condition fails to improve as anticipated.  Pt was provided 240 minutes of non-face-to-face time during this encounter.   Donia Guiles, LCSW   [facility] PHP THERAPIST PROGRESS NOTE  Victoria Simmons   Session Time: 9:00 am - 10:00 am  Participation Level: Active  Behavioral Response: CasualAlertAnxious and Depressed  Type of Therapy: Group Therapy  Treatment Goals addressed: Coping  Progress Towards Goals: Progressing  Interventions: CBT, DBT, Solution Focused, Strength-based, Supportive, and Reframing  Therapist Response: Clinician led check-in regarding current stressors and situation, and review of patient completed daily inventory. Clinician utilized active listening and empathetic response and validated patient emotions. Clinician facilitated processing group on pertinent issues.?   Summary: Patient arrived within time allowed. Patient rates her mood at a 8 on a scale of 1-10 with 10 being best. Pt reports feeling "pretty good." Pt states she slept 8 hours and ate 1x. Pt states it took her 1.5 hours to fall asleep and she is feeling sick when she eats and not keeping much down. Pt  states feeling nauseated when she has low moods is a common issue for her. Pt reports her cousin is still out of town which has made things not as tense at home. Pt able to process.?Pt engaged in discussion.?        Session Time: 10:00 am - 11:00 am  Participation Level: Active  Behavioral Response: CasualAlertAnxious and Depressed  Type of Therapy: Group Therapy  Treatment Goals addressed: Coping  Progress Towards Goals: Progressing  Interventions: CBT, DBT, Solution Focused, Strength-based, Supportive, and Reframing  Therapist Response: Cln led discussion on protecting our energy. Cln brought in topics of boundaries and self-esteem. Group discussed what drains them and why they engage in those activities. Cln encouraged pt's to consider what will protect their energy when making decisions.    Therapist Response: Pt enaged in discussion and is able to identify two things that are most draining right now.          Session Time: 11:00 -12:00   Participation Level: Active   Behavioral Response: CasualAlertDepressed   Type of Therapy: Group Therapy   Treatment Goals addressed: Coping   Progress Towards Goals: Progressing   Interventions: CBT, DBT, Solution Focused, Strength-based, Supportive, and Reframing   Summary: Cln led discussion on feelings and the role they play for our lives. Cln contextualized feelings as a warning system that brings attention to areas of our lives that need focus. Group members discussed how to pay attention to what the feeling is telling us versus reacting to unpleasant aspects of a feeling.    Therapist Response:  Pt engaged in discussion and reports understanding.      Session Time: 12:00 pm - 1:00  pm  Participation Level: Active  Behavioral Response: CasualAlertAnxious and Depressed  Type of Therapy: Group Therapy  Treatment Goals addressed: Coping  Progress Towards Goals: Progressing  Interventions: CBT, DBT, Solution Focused,  Strength-based, Supportive, and Reframing  Therapist Response: 12:00 - 12:50 pm: OT group led by cln E. Hollan 12:50 - 1:00 pm: Clinician led check-out. Clinician assessed for immediate needs, medication compliance and efficacy, and safety concerns?  Summary: 12:00 - 12:50 pm: Pt participated 12:50 - 1:00 pm: At check-out, patient reports no immediate concerns.?Patient demonstrates progress as evidenced by her continued engagement and by being receptive to treatment. Pt denies active SI/HI/self-harm thoughts at the end of group.  Suicidal/Homicidal: Nowithout intent/plan  Plan: ?Pt will continue in PHP and medication management while continuing to work on decreasing depression symptoms,?SI, and anxiety symptoms,?and increasing the ability to self manage symptoms.    Collaboration of Care: Medication Management AEB Dr. Morrie Sheldon  Patient/Guardian was advised Release of Information must be obtained prior to any record release in order to collaborate their care with an outside provider. Patient/Guardian was advised if they have not already done so to contact the registration department to sign all necessary forms in order for [name] to release information regarding their care.   Consent: Patient/Guardian gives verbal consent for treatment and assignment of benefits for services provided during this visit. Patient/Guardian expressed understanding and agreed to proceed.   Diagnosis: Bipolar 2 disorder, major depressive episode (HCC) [F31.81]    1. Bipolar 2 disorder, major depressive episode (HCC)   2. GAD (generalized anxiety disorder)       Donia Guiles, LCSW

## 2023-02-06 NOTE — Psych (Signed)
Virtual Visit via Video Note  I connected with Victoria Simmons on 01/24/23 at  9:00 AM EDT by a video enabled telemedicine application and verified that I am speaking with the correct person using two identifiers.  Location: Patient: pt's home Provider: clinical home office   I discussed the limitations of evaluation and management by telemedicine and the availability of in person appointments. The patient expressed understanding and agreed to proceed.   I discussed the assessment and treatment plan with the patient. The patient was provided an opportunity to ask questions and all were answered. The patient agreed with the plan and demonstrated an understanding of the instructions.   The patient was advised to call back or seek an in-person evaluation if the symptoms worsen or if the condition fails to improve as anticipated.  Pt was provided 240 minutes of non-face-to-face time during this encounter.   Donia Guiles, LCSW   [facility] PHP THERAPIST PROGRESS NOTE  Victoria Simmons [id number]   Session Time: 9:00 am - 10:00 am  Participation Level: Active  Behavioral Response: CasualAlertAnxious and Depressed  Type of Therapy: Group Therapy  Treatment Goals addressed: Coping  Progress Towards Goals: Progressing  Interventions: CBT, DBT, Solution Focused, Strength-based, Supportive, and Reframing  Therapist Response: Clinician led check-in regarding current stressors and situation, and review of patient completed daily inventory. Clinician utilized active listening and empathetic response and validated patient emotions. Clinician facilitated processing group on pertinent issues.?   Summary: Patient arrived within time allowed. Patient rates her mood at a 6 on a scale of 1-10 with 10 being best. Pt reports feeling "okay." Pt states she slept 8 hours and ate 1x. Pt states she did the [employer] and kept food down for longer however ultimately did vomit up what she ate. Pt reports  continued stress re: the rift with her cousin and feeling unworthy.  Pt able to process.?Pt engaged in discussion.?        Session Time: 10:00 am - 11:00 am  Participation Level: Active  Behavioral Response: CasualAlertAnxious and Depressed  Type of Therapy: Group Therapy  Treatment Goals addressed: Coping  Progress Towards Goals: Progressing  Interventions: CBT, DBT, Solution Focused, Strength-based, Supportive, and Reframing  Therapist Response: Cln led discussion on guilt and the way it impacts [name]. Cln utilized CBT cognitive distortion: emotional reasoning to inform discussion. Cln encouraged pt's to consider whether the guilt was founded as a first step to address the feeling. Group members discussed feelings of guilt and worked to determine whether those feelings were founded in truth or feeling.    Therapist Response: Pt engaged in discussion and is able to offer a guilt example and work through it with the group.          Session Time: 11:00 -12:00   Participation Level: Active   Behavioral Response: CasualAlertDepressed   Type of Therapy: Group Therapy   Treatment Goals addressed: Coping   Progress Towards Goals: Progressing   Interventions: CBT, DBT, Solution Focused, Strength-based, Supportive, and Reframing   Summary: Cln continued topic of boundaries. Cln discussed the different ways boundaries present: physical, emotional, intellectual, sexual, material, and time. Group talked about the ways in which each type presents for them and is a struggle.    Therapist Response: Pt engaged in discussion and identifies ways each type creates struggle.     Session Time: 12:00 pm - 1:00 pm  Participation Level: Active  Behavioral Response: CasualAlertAnxious and Depressed  Type of Therapy: Group Therapy  Treatment Goals addressed: Coping  Progress Towards Goals: Progressing  Interventions: CBT, DBT, Solution Focused, Strength-based, Supportive, and  Reframing  Therapist Response: 12:00 - 12:50 pm: OT group led by cln E. Hollan 12:50 - 1:00 pm: Clinician led check-out. Clinician assessed for immediate needs, medication compliance and efficacy, and safety concerns?  Summary: 12:00 - 12:50 pm: Pt participated 12:50 - 1:00 pm: At check-out, patient reports no immediate concerns.?Patient demonstrates progress as evidenced by her continued engagement and by being receptive to treatment. Pt denies active SI/HI/self-harm thoughts at the end of group.  Suicidal/Homicidal: Nowithout intent/plan  Plan: ?Pt will continue in PHP and medication management while continuing to work on decreasing depression symptoms,?SI, and anxiety symptoms,?and increasing the ability to self manage symptoms.    Collaboration of Care: Medication Management AEB Dr. Morrie Sheldon  Patient/Guardian was advised Release of Information must be obtained prior to any record release in order to collaborate their care with an outside provider. Patient/Guardian was advised if they have not already done so to contact the registration department to sign all necessary forms in order for [name] to release information regarding their care.   Consent: Patient/Guardian gives verbal consent for treatment and assignment of benefits for services provided during this visit. Patient/Guardian expressed understanding and agreed to proceed.   Diagnosis: Bipolar 2 disorder, major depressive episode (HCC) [F31.81]    1. Bipolar 2 disorder, major depressive episode (HCC)   2. GAD (generalized anxiety disorder)   3. Cannabis dependence (HCC)       Donia Guiles, LCSW

## 2023-02-06 NOTE — Psych (Signed)
Virtual Visit via Video Note  I connected with Victoria Simmons on 01/22/23 at  9:00 AM EDT by a video enabled telemedicine application and verified that I am speaking with the correct person using two identifiers.  Location: Patient: pt's home Provider: clinical home office   I discussed the limitations of evaluation and management by telemedicine and the availability of in person appointments. The patient expressed understanding and agreed to proceed.   I discussed the assessment and treatment plan with the patient. The patient was provided an opportunity to ask questions and all were answered. The patient agreed with the plan and demonstrated an understanding of the instructions.   The patient was advised to call back or seek an in-person evaluation if the symptoms worsen or if the condition fails to improve as anticipated.  Pt was provided 240 minutes of non-face-to-face time during this encounter.   Donia Guiles, LCSW   [facility] PHP THERAPIST PROGRESS NOTE  Victoria Simmons [id number]   Session Time: 9:00 am - 10:00 am  Participation Level: Active  Behavioral Response: CasualAlertAnxious and Depressed  Type of Therapy: Group Therapy  Treatment Goals addressed: Coping  Progress Towards Goals: Progressing  Interventions: CBT, DBT, Solution Focused, Strength-based, Supportive, and Reframing  Therapist Response: Clinician led check-in regarding current stressors and situation, and review of patient completed daily inventory. Clinician utilized active listening and empathetic response and validated patient emotions. Clinician facilitated processing group on pertinent issues.?   Summary: Patient arrived within time allowed. Patient rates her mood at a 8 on a scale of 1-10 with 10 being best. Pt reports feeling "really good." Pt states she slept 5 hours and ate 1x. Pt states she had a date with a new person yesterday and it went "amazing" and she is riding the high. Pt states she  had 3 drinks on the date and was proud of herself for moderating.  Pt able to process.?Pt engaged in discussion.?        Session Time: 10:00 am - 11:00 am  Participation Level: Active  Behavioral Response: CasualAlertAnxious and Depressed  Type of Therapy: Group Therapy  Treatment Goals addressed: Coping  Progress Towards Goals: Progressing  Interventions: CBT, DBT, Solution Focused, Strength-based, Supportive, and Reframing  Therapist Response: Cln led discussion on healthy aggression substitutes. Cln discussed the benefits to discharging energy and adrenaline when feeling "revved up" in emotion and the importance of balancing that discharge with safety and lack if negative consequences. Group brainstormed different ways to channel aggression in a healthy way and shared ways that have worked for them in the past.   Therapist Response: Pt engaged in discussion and identifies 3 options to try.          Session Time: 11:00 -12:00   Participation Level: Active   Behavioral Response: CasualAlertDepressed   Type of Therapy: Group Therapy   Treatment Goals addressed: Coping   Progress Towards Goals: Progressing   Interventions: CBT, DBT, Solution Focused, Strength-based, Supportive, and Reframing   Summary: Cln introduced topic of boundaries. Cln discussed how boundaries inform our relationships and affect self-esteem and personal agency. Group discussed the three types of boundaries: rigid, porous, and healthy and when each type is most helpful/harmful.    Therapist Response: Pt engaged in discussion and reports having mostly porous boundaries.      Session Time: 12:00 pm - 1:00 pm  Participation Level: Active  Behavioral Response: CasualAlertAnxious and Depressed  Type of Therapy: Group Therapy  Treatment Goals addressed: Coping  Progress Towards Goals: Progressing  Interventions: CBT, DBT, Solution Focused, Strength-based, Supportive, and  Reframing  Therapist Response: 12:00 - 12:50 pm: OT group led by cln E. Hollan 12:50 - 1:00 pm: Clinician led check-out. Clinician assessed for immediate needs, medication compliance and efficacy, and safety concerns?  Summary: 12:00 - 12:50 pm: Pt participated 12:50 - 1:00 pm: At check-out, patient reports no immediate concerns.?Patient demonstrates progress as evidenced by her continued engagement and by being receptive to treatment. Pt denies active SI/HI/self-harm thoughts at the end of group.    Suicidal/Homicidal: Nowithout intent/plan  Plan: ?Pt will continue in PHP and medication management while continuing to work on decreasing depression symptoms,?SI, and anxiety symptoms,?and increasing the ability to self manage symptoms.    Collaboration of Care: Medication Management AEB Dr. Morrie Sheldon  Patient/Guardian was advised Release of Information must be obtained prior to any record release in order to collaborate their care with an outside provider. Patient/Guardian was advised if they have not already done so to contact the registration department to sign all necessary forms in order for [name] to release information regarding their care.   Consent: Patient/Guardian gives verbal consent for treatment and assignment of benefits for services provided during this visit. Patient/Guardian expressed understanding and agreed to proceed.   Diagnosis: Bipolar 2 disorder, major depressive episode (HCC) [F31.81]    1. Bipolar 2 disorder, major depressive episode (HCC)   2. GAD (generalized anxiety disorder)       Donia Guiles, LCSW

## 2023-02-06 NOTE — Psych (Signed)
Virtual Visit via Video Note  I connected with Victoria Simmons on 01/25/23 at  9:00 AM EDT by a video enabled telemedicine application and verified that I am speaking with the correct person using two identifiers.  Location: Patient: pt's home Provider: clinical home office   I discussed the limitations of evaluation and management by telemedicine and the availability of in person appointments. The patient expressed understanding and agreed to proceed.   I discussed the assessment and treatment plan with the patient. The patient was provided an opportunity to ask questions and all were answered. The patient agreed with the plan and demonstrated an understanding of the instructions.   The patient was advised to call back or seek an in-person evaluation if the symptoms worsen or if the condition fails to improve as anticipated.  Pt was provided 240 minutes of non-face-to-face time during this encounter.   Donia Guiles, LCSW   [facility] PHP THERAPIST PROGRESS NOTE  Victoria Simmons [id number]   Session Time: 9:00 am - 10:00 am  Participation Level: Active  Behavioral Response: CasualAlertAnxious and Depressed  Type of Therapy: Group Therapy  Treatment Goals addressed: Coping  Progress Towards Goals: Progressing  Interventions: CBT, DBT, Solution Focused, Strength-based, Supportive, and Reframing  Therapist Response: Clinician led check-in regarding current stressors and situation, and review of patient completed daily inventory. Clinician utilized active listening and empathetic response and validated patient emotions. Clinician facilitated processing group on pertinent issues.?   Summary: Patient arrived within time allowed. Patient rates her mood at a 8 on a scale of 1-10 with 10 being best. Pt reports feeling "excited for tomorrow." Pt states she slept 10 hours and ate 3 snacks. Pt states kept her food down yesterday. Pt reports having a conversation with her cousin which cleared  the air some and pt feels less tense around her now. Pt shares trying to focus on her date tomorrow to stay positive. Pt able to process.?Pt engaged in discussion.?        Session Time: 10:00 am - 11:00 am  Participation Level: Active  Behavioral Response: CasualAlertAnxious and Depressed  Type of Therapy: Group Therapy  Treatment Goals addressed: Coping  Progress Towards Goals: Progressing  Interventions: CBT, DBT, Solution Focused, Strength-based, Supportive, and Reframing  Therapist Response: Cln led discussion on unknowns and the way they impact our anxiety. Cln discussed cognitive restructuring, DBT distraction skills, and positive mantras/prayer as ways to address the anxiety. Cln discussed idea "I can do hard things" and ways to bolster confidence in our ability to handle difficult situations.    Therapist Response: Pt shared current unknowns they are dealing with and is able to identify ways to manage.          Session Time: 11:00 -12:00   Participation Level: Active   Behavioral Response: CasualAlertDepressed   Type of Therapy: Group Therapy   Treatment Goals addressed: Coping   Progress Towards Goals: Progressing   Interventions: CBT, DBT, Solution Focused, Strength-based, Supportive, and Reframing   Summary: Cln continued topic of boundaries and led a "boundary workshop" in which group members brought current boundary issues and group worked together to apply boundary concepts to help address the concern. Cln helped shape conversation to maintain fidelity.    Therapist Response:  Pt engaged in discussion and shared a current boundary issue and reports gaining insight.      Session Time: 12:00 pm - 1:00 pm  Participation Level: Active  Behavioral Response: CasualAlertAnxious and Depressed  Type of Therapy:  Group Therapy  Treatment Goals addressed: Coping  Progress Towards Goals: Progressing  Interventions: CBT, DBT, Solution Focused,  Strength-based, Supportive, and Reframing  Therapist Response: 12:00 - 12:50 pm: OT group led by cln E. Hollan 12:50 - 1:00 pm: Clinician led check-out. Clinician assessed for immediate needs, medication compliance and efficacy, and safety concerns?  Summary: 12:00 - 12:50 pm: Pt participated 12:50 - 1:00 pm: At check-out, patient reports no immediate concerns.?Patient demonstrates progress as evidenced by her continued engagement and by being receptive to treatment. Pt denies active SI/HI/self-harm thoughts at the end of group.  Suicidal/Homicidal: Nowithout intent/plan  Plan: ?Pt will continue in PHP and medication management while continuing to work on decreasing depression symptoms,?SI, and anxiety symptoms,?and increasing the ability to self manage symptoms.    Collaboration of Care: Medication Management AEB Dr. Morrie Sheldon  Patient/Guardian was advised Release of Information must be obtained prior to any record release in order to collaborate their care with an outside provider. Patient/Guardian was advised if they have not already done so to contact the registration department to sign all necessary forms in order for [name] to release information regarding their care.   Consent: Patient/Guardian gives verbal consent for treatment and assignment of benefits for services provided during this visit. Patient/Guardian expressed understanding and agreed to proceed.   Diagnosis: Bipolar 2 disorder, major depressive episode (HCC) [F31.81]    1. Bipolar 2 disorder, major depressive episode (HCC)   2. GAD (generalized anxiety disorder)       Donia Guiles, LCSW

## 2023-02-06 NOTE — Psych (Signed)
Virtual Visit via Video Note  I connected with Victoria Simmons on 01/23/23 at  9:00 AM EDT by a video enabled telemedicine application and verified that I am speaking with the correct person using two identifiers.  Location: Patient: pt's home Provider: clinical home office   I discussed the limitations of evaluation and management by telemedicine and the availability of in person appointments. The patient expressed understanding and agreed to proceed.   I discussed the assessment and treatment plan with the patient. The patient was provided an opportunity to ask questions and all were answered. The patient agreed with the plan and demonstrated an understanding of the instructions.   The patient was advised to call back or seek an in-person evaluation if the symptoms worsen or if the condition fails to improve as anticipated.  Pt was provided 240 minutes of non-face-to-face time during this encounter.   Donia Guiles, LCSW   [facility] PHP THERAPIST PROGRESS NOTE  Victoria Simmons [id number]   Session Time: 9:00 am - 10:00 am  Participation Level: Active  Behavioral Response: CasualAlertAnxious and Depressed  Type of Therapy: Group Therapy  Treatment Goals addressed: Coping  Progress Towards Goals: Progressing  Interventions: CBT, DBT, Solution Focused, Strength-based, Supportive, and Reframing  Therapist Response: Clinician led check-in regarding current stressors and situation, and review of patient completed daily inventory. Clinician utilized active listening and empathetic response and validated patient emotions. Clinician facilitated processing group on pertinent issues.?   Summary: Patient arrived within time allowed. Patient rates her mood at a 7 on a scale of 1-10 with 10 being best. Pt reports feeling "positive." Pt states she slept 7 hours and ate 0x. Pt states she is not keeping food down and vomiting up anything she tries to eat. Pt is recommended to follow the [employer]  diet and try to stay hydrated.  Pt reports she scheduled another date and is feeling good having "something to look forward to." Pt struggles with mood dependent thinking. Pt able to process.?Pt engaged in discussion.?        Session Time: 10:00 am - 11:00 am  Participation Level: Active  Behavioral Response: CasualAlertAnxious and Depressed  Type of Therapy: Group Therapy  Treatment Goals addressed: Coping  Progress Towards Goals: Progressing  Interventions: CBT, DBT, Solution Focused, Strength-based, Supportive, and Reframing  Therapist Response: Cln led processing group for pt's current struggles. Group members shared stressors and provided support and feedback. Cln brought in topics of boundaries, healthy relationships, and unhealthy thought processes to inform discussion.    Therapist Response: Pt able to process and provide support to group.          Session Time: 11:00 -12:00   Participation Level: Active   Behavioral Response: CasualAlertDepressed   Type of Therapy: Group Therapy, Spiritual Care   Treatment Goals addressed: Coping   Progress Towards Goals: Progressing   Interventions: Supportive, Education   Summary:  Victoria Simmons, Chaplain, led group.   Therapist Response: Pt participated     Session Time: 12:00 pm - 1:00 pm  Participation Level: Active  Behavioral Response: CasualAlertAnxious and Depressed  Type of Therapy: Group Therapy  Treatment Goals addressed: Coping  Progress Towards Goals: Progressing  Interventions: CBT, DBT, Solution Focused, Strength-based, Supportive, and Reframing  Therapist Response: 12:00 - 12:50 pm: OT group led by cln E. Hollan 12:50 - 1:00 pm: Clinician led check-out. Clinician assessed for immediate needs, medication compliance and efficacy, and safety concerns?  Summary: 12:00 - 12:50 pm: Pt participated 12:50 -  1:00 pm: At check-out, patient reports no immediate concerns.?Patient demonstrates progress as  evidenced by her continued engagement and by being receptive to treatment. Pt denies active SI/HI/self-harm thoughts at the end of group.  Suicidal/Homicidal: Nowithout intent/plan  Plan: ?Pt will continue in PHP and medication management while continuing to work on decreasing depression symptoms,?SI, and anxiety symptoms,?and increasing the ability to self manage symptoms.    Collaboration of Care: Medication Management AEB Dr. Morrie Sheldon  Patient/Guardian was advised Release of Information must be obtained prior to any record release in order to collaborate their care with an outside provider. Patient/Guardian was advised if they have not already done so to contact the registration department to sign all necessary forms in order for [name] to release information regarding their care.   Consent: Patient/Guardian gives verbal consent for treatment and assignment of benefits for services provided during this visit. Patient/Guardian expressed understanding and agreed to proceed.   Diagnosis: Bipolar 2 disorder, major depressive episode (HCC) [F31.81]    1. Bipolar 2 disorder, major depressive episode (HCC)   2. GAD (generalized anxiety disorder)       Donia Guiles, LCSW

## 2023-02-06 NOTE — Psych (Signed)
Virtual Visit via Video Note  I connected with Victoria Simmons on 01/28/23 at  9:00 AM EDT by a video enabled telemedicine application and verified that I am speaking with the correct person using two identifiers.  Location: Patient: pt's home Provider: clinical home office   I discussed the limitations of evaluation and management by telemedicine and the availability of in person appointments. The patient expressed understanding and agreed to proceed.   I discussed the assessment and treatment plan with the patient. The patient was provided an opportunity to ask questions and all were answered. The patient agreed with the plan and demonstrated an understanding of the instructions.   The patient was advised to call back or seek an in-person evaluation if the symptoms worsen or if the condition fails to improve as anticipated.  Pt was provided 240 minutes of non-face-to-face time during this encounter.   Donia Guiles, LCSW   [facility] PHP THERAPIST PROGRESS NOTE  Victoria Simmons [id number]   Session Time: 9:00 am - 10:00 am  Participation Level: Active  Behavioral Response: CasualAlertAnxious and Depressed  Type of Therapy: Group Therapy  Treatment Goals addressed: Coping  Progress Towards Goals: Progressing  Interventions: CBT, DBT, Solution Focused, Strength-based, Supportive, and Reframing  Therapist Response: Clinician led check-in regarding current stressors and situation, and review of patient completed daily inventory. Clinician utilized active listening and empathetic response and validated patient emotions. Clinician facilitated processing group on pertinent issues.?   Summary: Patient arrived within time allowed. Patient rates her mood at a 10 on a scale of 1-10 with 10 being best. Pt reports feeling "really good." Pt states she slept in 2 hour increments and ate 3x. Pt states kept her food down mainly over the weekend. Pt shares her date was "really really good" and  she continues to "ride the high." Cln expressed concern that mood increase seems directly tied to the new person in pt's life and encouraged pt to focus on healthy habits while it is easier to achieve them with increased mood. Pt able to process.?Pt engaged in discussion.?        Session Time: 10:00 am - 11:00 am  Participation Level: Active  Behavioral Response: CasualAlertAnxious and Depressed  Type of Therapy: Group Therapy  Treatment Goals addressed: Coping  Progress Towards Goals: Progressing  Interventions: CBT, DBT, Solution Focused, Strength-based, Supportive, and Reframing  Therapist Response: Cln led discussion on deep breathing and its therapeutic benefits, using DBT TIPP skills to inform discussion. Group practiced how to breathe from their diaphragms to ensure therapeutic quality and different ways to keep track of regulating breaths.    Therapist Response:  Pt engaged in discussion and practice.          Session Time: 11:00 -12:00   Participation Level: Active   Behavioral Response: CasualAlertDepressed   Type of Therapy: Group Therapy   Treatment Goals addressed: Coping   Progress Towards Goals: Progressing   Interventions: CBT, DBT, Solution Focused, Strength-based, Supportive, and Reframing   Summary: Cln led discussion on increasing support. Group members shared what supports they have and where it is lacking. Cln encouraged ways to increase adult friendships including accepting invitations, joining clubs/activities, reaching out, and support groups.    Therapist Response: Pt engaged in discussion and is able to determine ways to increase their support.     Session Time: 12:00 pm - 1:00 pm  Participation Level: Active  Behavioral Response: CasualAlertAnxious and Depressed  Type of Therapy: Group Therapy  Treatment Goals  addressed: Coping  Progress Towards Goals: Progressing  Interventions: CBT, DBT, Solution Focused, Strength-based,  Supportive, and Reframing  Therapist Response: 12:00 - 12:50 pm: OT group led by cln E. Hollan 12:50 - 1:00 pm: Clinician led check-out. Clinician assessed for immediate needs, medication compliance and efficacy, and safety concerns?  Summary: 12:00 - 12:50 pm: Pt participated 12:50 - 1:00 pm: At check-out, patient reports no immediate concerns.?Patient demonstrates progress as evidenced by her continued engagement and by being receptive to treatment. Pt denies active SI/HI/self-harm thoughts at the end of group.  Suicidal/Homicidal: Nowithout intent/plan  Plan: ?Pt will continue in PHP and medication management while continuing to work on decreasing depression symptoms,?SI, and anxiety symptoms,?and increasing the ability to self manage symptoms.    Collaboration of Care: Medication Management AEB Dr. Morrie Sheldon  Patient/Guardian was advised Release of Information must be obtained prior to any record release in order to collaborate their care with an outside provider. Patient/Guardian was advised if they have not already done so to contact the registration department to sign all necessary forms in order for [name] to release information regarding their care.   Consent: Patient/Guardian gives verbal consent for treatment and assignment of benefits for services provided during this visit. Patient/Guardian expressed understanding and agreed to proceed.   Diagnosis: Bipolar 2 disorder, major depressive episode (HCC) [F31.81]    1. Bipolar 2 disorder, major depressive episode (HCC)   2. GAD (generalized anxiety disorder)       Donia Guiles, LCSW

## 2023-02-07 ENCOUNTER — Telehealth (INDEPENDENT_AMBULATORY_CARE_PROVIDER_SITE_OTHER): Payer: Medicaid Other | Admitting: Child and Adolescent Psychiatry

## 2023-02-07 ENCOUNTER — Other Ambulatory Visit (HOSPITAL_COMMUNITY): Payer: Self-pay

## 2023-02-07 DIAGNOSIS — F411 Generalized anxiety disorder: Secondary | ICD-10-CM | POA: Diagnosis not present

## 2023-02-07 DIAGNOSIS — F3181 Bipolar II disorder: Secondary | ICD-10-CM | POA: Diagnosis not present

## 2023-02-07 MED ORDER — QUETIAPINE FUMARATE 100 MG PO TABS
100.0000 mg | ORAL_TABLET | Freq: Every day | ORAL | 0 refills | Status: DC
  Filled 2023-02-07: qty 90, 90d supply, fill #0
  Filled 2023-03-02 – 2023-03-05 (×2): qty 30, 30d supply, fill #0

## 2023-02-07 NOTE — Psych (Signed)
Virtual Visit via Video Note  I connected with Victoria Simmons on 02/01/23 at  9:00 AM EDT by a video enabled telemedicine application and verified that I am speaking with the correct person using two identifiers.  Location: Patient: pt's home Provider: clinical home office   I discussed the limitations of evaluation and management by telemedicine and the availability of in person appointments. The patient expressed understanding and agreed to proceed.   I discussed the assessment and treatment plan with the patient. The patient was provided an opportunity to ask questions and all were answered. The patient agreed with the plan and demonstrated an understanding of the instructions.   The patient was advised to call back or seek an in-person evaluation if the symptoms worsen or if the condition fails to improve as anticipated.  Pt was provided 240 minutes of non-face-to-face time during this encounter.   Donia Guiles, LCSW   [facility] PHP THERAPIST PROGRESS NOTE  Victoria Simmons [id number]   Session Time: 9:00 am - 10:00 am  Participation Level: Active  Behavioral Response: CasualAlertAnxious and Depressed  Type of Therapy: Group Therapy  Treatment Goals addressed: Coping  Progress Towards Goals: Progressing  Interventions: CBT, DBT, Solution Focused, Strength-based, Supportive, and Reframing  Therapist Response: Clinician led check-in regarding current stressors and situation, and review of patient completed daily inventory. Clinician utilized active listening and empathetic response and validated patient emotions. Clinician facilitated processing group on pertinent issues.?   Summary: Patient arrived within time allowed. Patient rates her mood at a 10 on a scale of 1-10 with 10 being best. Pt reports feeling "good." Pt states she slept 10 hours and ate 1x. Pt shares that the person she has been going on dates with asked pt to be her girlfriend. 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 Pt states she feels more confident in her ability to manage stressors.  Pt able to process.?Pt engaged in discussion.?        Session Time: 10:00 am - 11:00 am  Participation Level: Active  Behavioral Response: CasualAlertAnxious and Depressed  Type of Therapy: Group Therapy  Treatment Goals addressed: Coping  Progress Towards Goals: Progressing  Interventions: CBT, DBT, Solution Focused, Strength-based, Supportive, and Reframing  Therapist Response: Cln led discussion on how/when to share sensitive topics with others. Cln discussed the connection between trust and appropriate sharing and how to determine what is appropriate to share.    Therapist Response: Pt engaged in discussion and reports gaining insight.           Session Time: 11:00 -12:00   Participation Level: Active   Behavioral Response: CasualAlertDepressed   Type of Therapy: Group Therapy   Treatment Goals addressed: Coping   Progress Towards Goals: Progressing   Interventions: CBT, DBT, Solution Focused, Strength-based, Supportive, and Reframing   Summary: Cln led discussion on ways to manage stressors and feelings over the weekend. Group members  brainstormed things to do over the weekend for multiple levels of energy, access, and moods. Cln reviewed crisis services should they be needed and provided pt's with the text crisis line, mobile crisis, national suicide hotline, [facility] 24/7 line, and information on [facility].      Therapist Response:  Pt engaged in discussion and is able to identify 3 ideas of what to do over the weekend to keep their mind engaged.      Session  Time: 12:00 pm - 1:00 pm  Participation Level: Active  Behavioral Response: CasualAlertAnxious and Depressed  Type of Therapy: Group Therapy  Treatment Goals addressed: Coping  Progress Towards Goals:  Progressing  Interventions: CBT, DBT, Solution Focused, Strength-based, Supportive, and Reframing  Therapist Response: 12:00 - 12:50 pm: OT group led by cln E. Hollan 12:50 - 1:00 pm: Clinician led check-out. Clinician assessed for immediate needs, medication compliance and efficacy, and safety concerns?  Summary: 12:00 - 12:50 pm: Pt participated 12:50 - 1:00 pm: At check-out, patient reports no immediate concerns.?Patient demonstrates progress as evidenced by her continued engagement and by being receptive to treatment. Pt denies active SI/HI/self-harm thoughts at the end of group.    Suicidal/Homicidal: Nowithout intent/plan  Plan: ?Pt will discharge from PHP due to meeting treatment goals of decreased depression symptoms,?SI, and anxiety symptoms,?and increased ability to self manage symptoms. Pt will step down to outpatient treatment and return to previous providers within this agency. Pt has appt for therapy with C Hussami on 5/3 and for psychiatry with Dr Jerold Coombe on 5/2. Pt and provider are aligned with discharge plan. Pt denies SI/HI at time of discharge.   Collaboration of Care: Medication Management AEB Dr. Morrie Sheldon  Patient/Guardian was advised Release of Information must be obtained prior to any record release in order to collaborate their care with an outside provider. Patient/Guardian was advised if they have not already done so to contact the registration department to sign all necessary forms in order for [name] to release information regarding their care.   Consent: Patient/Guardian gives verbal consent for treatment and assignment of benefits for services provided during this visit. Patient/Guardian expressed understanding and agreed to proceed.   Diagnosis: Bipolar 2 disorder, major depressive episode (HCC) [F31.81]    1. Bipolar 2 disorder, major depressive episode (HCC)   2. GAD (generalized anxiety disorder)       Donia Guiles, LCSW
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
## 2023-02-07 NOTE — Psych (Signed)
Virtual Visit via Video Note  I connected with Victoria Simmons on 01/30/23 at  9:00 AM EDT by a video enabled telemedicine application and verified that I am speaking with the correct person using two identifiers.  Location: Patient: pt's home Provider: clinical home office   I discussed the limitations of evaluation and management by telemedicine and the availability of in person appointments. The patient expressed understanding and agreed to proceed.   I discussed the assessment and treatment plan with the patient. The patient was provided an opportunity to ask questions and all were answered. The patient agreed with the plan and demonstrated an understanding of the instructions.   The patient was advised to call back or seek an in-person evaluation if the symptoms worsen or if the condition fails to improve as anticipated.  Pt was provided 240 minutes of non-face-to-face time during this encounter.   Donia Guiles, LCSW   [facility] PHP THERAPIST PROGRESS NOTE  ROOPA GRAVER [id number]   Session Time: 9:00 am - 10:00 am  Participation Level: Active  Behavioral Response: CasualAlertAnxious and Depressed  Type of Therapy: Group Therapy  Treatment Goals addressed: Coping  Progress Towards Goals: Progressing  Interventions: CBT, DBT, Solution Focused, Strength-based, Supportive, and Reframing  Therapist Response: Clinician led check-in regarding current stressors and situation, and review of patient completed daily inventory. Clinician utilized active listening and empathetic response and validated patient emotions. Clinician facilitated processing group on pertinent issues.?   Summary: Patient arrived within time allowed. Patient rates her mood at a 10 on a scale of 1-10 with 10 being best. Pt reports feeling "positive." Pt states she slept 4.5 hours and ate 3x. Pt states she had 3 job interviews yesterday and feels positive about them. Pt reports feeling encouraged and more  capable of handling adding more responsibility back into her life. Pt states she struggled to fall asleep last night. Pt able to process.?Pt engaged in discussion.?        Session Time: 10:00 am - 11:00 am  Participation Level: Active  Behavioral Response: CasualAlertAnxious and Depressed  Type of Therapy: Group Therapy  Treatment Goals addressed: Coping  Progress Towards Goals: Progressing  Interventions: CBT, DBT, Solution Focused, Strength-based, Supportive, and Reframing  Therapist Response: Cln led processing group for pt's current struggles. Group members shared stressors and provided support and feedback. Cln brought in topics of boundaries, healthy relationships, and unhealthy thought processes to inform discussion.    Therapist Response: Pt able to process and provide support to group.          Session Time: 11:00 -12:00   Participation Level: Active   Behavioral Response: CasualAlertDepressed   Type of Therapy: Group Therapy, Spiritual Care   Treatment Goals addressed: Coping   Progress Towards Goals: Progressing   Interventions: Supportive, Education   Summary:  [name], Chaplain, led group.   Therapist Response: Pt participated     Session Time: 12:00 pm - 1:00 pm  Participation Level: Active  Behavioral Response: CasualAlertAnxious and Depressed  Type of Therapy: Group Therapy  Treatment Goals addressed: Coping  Progress Towards Goals: Progressing  Interventions: CBT, DBT, Solution Focused, Strength-based, Supportive, and Reframing  Therapist Response: 12:00 - 12:50 pm: OT group led by cln E. Hollan 12:50 - 1:00 pm: Clinician led check-out. Clinician assessed for immediate needs, medication compliance and efficacy, and safety concerns?  Summary: 12:00 - 12:50 pm: Pt participated 12:50 - 1:00 pm: At check-out, patient reports no immediate concerns.?Patient demonstrates progress as evidenced by  her continued engagement and by being  receptive to treatment. Pt denies active SI/HI/self-harm thoughts at the end of group.  Suicidal/Homicidal: Nowithout intent/plan  Plan: ?Pt will continue in PHP and medication management while continuing to work on decreasing depression symptoms,?SI, and anxiety symptoms,?and increasing the ability to self manage symptoms.    Collaboration of Care: Medication Management AEB Dr. Morrie Sheldon  Patient/Guardian was advised Release of Information must be obtained prior to any record release in order to collaborate their care with an outside provider. Patient/Guardian was advised if they have not already done so to contact the registration department to sign all necessary forms in order for [name] to release information regarding their care.   Consent: Patient/Guardian gives verbal consent for treatment and assignment of benefits for services provided during this visit. Patient/Guardian expressed understanding and agreed to proceed.   Diagnosis: Bipolar 2 disorder, major depressive episode (HCC) [F31.81]    1. Bipolar 2 disorder, major depressive episode (HCC)   2. GAD (generalized anxiety disorder)       Donia Guiles, LCSW

## 2023-02-07 NOTE — Psych (Signed)
Virtual Visit via Video Note  I connected with Victoria Simmons on 01/31/23 at  9:00 AM EDT by a video enabled telemedicine application and verified that I am speaking with the correct person using two identifiers.  Location: Patient: pt's home Provider: clinical home office   I discussed the limitations of evaluation and management by telemedicine and the availability of in person appointments. The patient expressed understanding and agreed to proceed.   I discussed the assessment and treatment plan with the patient. The patient was provided an opportunity to ask questions and all were answered. The patient agreed with the plan and demonstrated an understanding of the instructions.   The patient was advised to call back or seek an in-person evaluation if the symptoms worsen or if the condition fails to improve as anticipated.  Pt was provided 240 minutes of non-face-to-face time during this encounter.   Donia Guiles, LCSW   [facility] PHP THERAPIST PROGRESS NOTE  Victoria Simmons [id number]   Session Time: 9:00 am - 10:00 am  Participation Level: Active  Behavioral Response: CasualAlertAnxious and Depressed  Type of Therapy: Group Therapy  Treatment Goals addressed: Coping  Progress Towards Goals: Progressing  Interventions: CBT, DBT, Solution Focused, Strength-based, Supportive, and Reframing  Therapist Response: Clinician led check-in regarding current stressors and situation, and review of patient completed daily inventory. Clinician utilized active listening and empathetic response and validated patient emotions. Clinician facilitated processing group on pertinent issues.?   Summary: Patient arrived within time allowed. Patient rates her mood at a 8 on a scale of 1-10 with 10 being best. Pt reports feeling "groggy." Pt states she slept 9.5 hours and ate 3x. Pt states she spent time with her best friend and her cousin yesterday. Pt reports increased awareness of the activities  that make her feel good and refreshed. Pt able to process.?Pt engaged in discussion.?        Session Time: 10:00 am - 11:00 am  Participation Level: Active  Behavioral Response: CasualAlertAnxious and Depressed  Type of Therapy: Group Therapy  Treatment Goals addressed: Coping  Progress Towards Goals: Progressing  Interventions: CBT, DBT, Solution Focused, Strength-based, Supportive, and Reframing  Therapist Response: Cln led discussion on HALT (hungry, angry, lonely, tired) acronym, which encourages check-in with ourselves when feeling emotional. Cln encouraged pt's to use HALT as a first check-in step to address basic vulnerabilities before reacting. Group members discussed ways in which they react to being angry, lonely, and tired, and work to determine times in which utilizing HALT would have diverted a bigger issue.     Therapist Response:  Pt engaged in discussion and reports understanding of HALT and willingness to utilize it.          Session Time: 11:00 -12:00   Participation Level: Active   Behavioral Response: CasualAlertDepressed   Type of Therapy: Group Therapy   Treatment Goals addressed: Coping   Progress Towards Goals: Progressing   Interventions: CBT, DBT, Solution Focused, Strength-based, Supportive, and Reframing   Summary: Cln continued topic of DBT distress tolerance skills. Cln introduced TIPP skills to use during cases of extreme emotion. Group practiced deep breathing and progressive muscle relaxation together. Group discussed which situations they can apply TIPP skills.    Therapist Response:  Pt engaged in discussion and reports understanding of how to apply skills.      Session Time: 12:00 pm - 1:00 pm  Participation Level: Active  Behavioral Response: CasualAlertAnxious and Depressed  Type of Therapy: Group Therapy  Treatment Goals addressed: Coping  Progress Towards Goals: Progressing  Interventions: CBT, DBT, Solution  Focused, Strength-based, Supportive, and Reframing  Therapist Response: 12:00 - 12:50 pm: OT group led by cln E. Hollan 12:50 - 1:00 pm: Clinician led check-out. Clinician assessed for immediate needs, medication compliance and efficacy, and safety concerns?  Summary: 12:00 - 12:50 pm: Pt participated 12:50 - 1:00 pm: At check-out, patient reports no immediate concerns.?Patient demonstrates progress as evidenced by her continued engagement and by being receptive to treatment. Pt denies active SI/HI/self-harm thoughts at the end of group.  Suicidal/Homicidal: Nowithout intent/plan  Plan: ?Pt will continue in PHP and medication management while continuing to work on decreasing depression symptoms,?SI, and anxiety symptoms,?and increasing the ability to self manage symptoms.    Collaboration of Care: Medication Management AEB Dr. Morrie Sheldon  Patient/Guardian was advised Release of Information must be obtained prior to any record release in order to collaborate their care with an outside provider. Patient/Guardian was advised if they have not already done so to contact the registration department to sign all necessary forms in order for [name] to release information regarding their care.   Consent: Patient/Guardian gives verbal consent for treatment and assignment of benefits for services provided during this visit. Patient/Guardian expressed understanding and agreed to proceed.   Diagnosis: Bipolar 2 disorder, major depressive episode (HCC) [F31.81]    1. Bipolar 2 disorder, major depressive episode (HCC)   2. GAD (generalized anxiety disorder)       Donia Guiles, LCSW

## 2023-02-07 NOTE — Progress Notes (Signed)
Virtual Visit via Video Note  I connected with Victoria Simmons on 02/07/23 at 10:00 AM EDT by a video enabled telemedicine application and verified that I am speaking with the correct person using two identifiers.  Location: Patient: at work Provider: office   I discussed the limitations of evaluation and management by telemedicine and the availability of in person appointments. The patient expressed understanding and agreed to proceed.    I discussed the assessment and treatment plan with the patient. The patient was provided an opportunity to ask questions and all were answered. The patient agreed with the plan and demonstrated an understanding of the instructions.   The patient was advised to call back or seek an in-person evaluation if the symptoms worsen or if the condition fails to improve as anticipated.    Darcel Smalling, MD      [facility] MD/PA/NP OP Progress Note  02/07/2023 10:30 AM Victoria Simmons  MRN:  [id number]  Chief Complaint: "I am doing great..."(pt)  Synopsis: Victoria Simmons is a 27 year old Caucasian female, employed at [employer] and [employer] store in [place], with history of bipolar disorder type II, GAD, insomnia was seen by Dr. Daleen Bo since 2017 and transition to this writer in April 2020.  She is currently prescribed Lamictal 150 mg once a day, Abilify 10 mg once a day, trazodone 150 mg once a day and Luvox 150 mg once a day.   HPI:   Antwonette was seen and evaluated over telemedicine encounter for medication management follow-up.  In the interim since last appointment, she started attending PHP program, completed last week after being there for 3 weeks.    Today she reports that she has been feeling "great", and PHP program was very helpful.  She says that she learned helpful coping skills to manage her distressing emotions, such as distractions and also learn to develop healthy boundaries around people so that she does not get pushed over.  She has been feeling more confident, and  her mood has been "very good.  She denies feeling depressed or having any low lows since last 2 to 3 weeks.  She reports that she lost her job while she was at [facility] during the first week, that made her depressed and she was tearful but PHP was very supportive and helped her get over it.  She is currently still looking for jobs and have applied to many places, had 3 interviews within the last 2 days and awaiting to hear back.  She also says that other than finding a job and financial anxiety, she has been doing well in regards of her anxiety.  She has been spending her time going to gym, hanging out with her cousin, and enjoys this activity.  She says that she has been sleeping well despite taking trazodone 100 mg.  She denies any SI or HI, or nonsuicidal self-harm behaviors or thoughts.  She tells me that she discontinued Abilify and Wellbutrin and was started on Seroquel during PHP stay and has been doing better on them.  We discussed to continue with current medications because of her improvement, and also recommended to continue follow-up with her therapist more frequently.  She verbalized understanding.  She says that she has reduced marijuana use to about once a day instead of 4-5 times a day, and has been feeling very good since she has decreased the use H.  Continued with motivational interviewing and encouraged her to continue with the good work, pointed out the differences that she  has noticed since he has decreased her use of marijuana.  In regards of alcohol, she said that during the first week of PHP, she had a binge drinking episode but since then she has not been drinking except on occasion about 1-2 drinks.  Denies any other substance abuse.  Visit Diagnosis:    ICD-10-CM   1. Bipolar 2 disorder, major depressive episode (HCC)  F31.81 QUEtiapine (SEROQUEL) 100 MG tablet        Past Psychiatric History: PPHx reviewed today and as followin. 2 previous inpatient admission at [facility] at the  age of 58, and last at [facility], no previous suicide attempts, has history of cutting and her past medication trials include Prozac and Zoloft.  Decreasing Luvox to 50 mg worsened her symptoms of anxiety therefore it was increased back to 100 mg once a day.   Previous trials of Zoloft 150 mg and Prozac 10 mg stopped due to inefficacy.   Past Medical History:  Past Medical History:  Diagnosis Date   Anxiety    Bipolar 2 disorder (HCC)    Depression    Dysmenorrhea    Hypertriglyceridemia 2020   MDD (major depressive disorder) 06/19/2017   Ovarian torsion 2008    Past Surgical History:  Procedure Laterality Date   COLONOSCOPY  08/2020   polyp, int hem, benign biopsies ([facility])   ESOPHAGOGASTRODUODENOSCOPY  08/2020   LA grade A reflux esophagitis and antral gastritis, H pylori neg, treated with PPI BID and carafate course ([facility]   OVARY SURGERY  06/30/2007   dx laparoscopy, reduction of right adnexal torsion, bilateral oophoropexy of each ovary to posterior uterine fundus    Family Psychiatric History: As mentioned in initial H&P, reviewed today, no change Family History:  Family History  Problem Relation Age of Onset   Hepatitis B Mother    Anxiety disorder Mother    Hypertension Father    ADD / ADHD Brother    Ovarian cancer Paternal Grandmother     Social History:  Social History   Socioeconomic History   Marital status: Legally Separated    Spouse name: Not on file   Number of children: 0   Years of education: Not on file   Highest education level: Some college, no degree  Occupational History   Occupation: [employer]    Comment: full time  Tobacco Use   Smoking status: Never   Smokeless tobacco: Never  Vaping Use   Vaping Use: Former  Substance and Sexual Activity   Alcohol use: Yes    Comment: twice a month (5-7 drinks)   Drug use: Yes    Types: Marijuana    Comment: last use 16 Aug 2020   Sexual activity: Yes  Other Topics Concern   Not on file   Social History Narrative          Social Determinants of Health   Financial Resource Strain: Low Risk  (08/26/2017)   Overall Financial Resource Strain (CARDIA)    Difficulty of Paying Living Expenses: Not hard at all  Food Insecurity: No Food Insecurity (08/26/2017)   Hunger Vital Sign    Worried About Running Out of Food in the Last Year: Never true    Ran Out of Food in the Last Year: Never true  Transportation Needs: No Transportation Needs (08/26/2017)   PRAPARE - Administrator, Civil Service (Medical): No    Lack of Transportation (Non-Medical): No  Physical Activity: Sufficiently Active (08/26/2017)   Exercise Vital Sign  Days of Exercise per Week: 5 days    Minutes of Exercise per Session: 60 min  Stress: No Stress Concern Present (08/26/2017)   [employer] of Occupational Health - Occupational Stress Questionnaire    Feeling of Stress : Not at all  Social Connections: Moderately Isolated (08/26/2017)   Social Connection and Isolation Panel [NHANES]    Frequency of Communication with Friends and Family: Three times a week    Frequency of Social Gatherings with Friends and Family: More than three times a week    Attends Religious Services: Never    Database administrator or Organizations: No    Attends Engineer, structural: Never    Marital Status: Never married    Allergies: No Known Allergies  Metabolic Disorder Labs: Lab Results  Component Value Date   HGBA1C 5.0 07/26/2020   MPG 97 07/26/2020   MPG 94 05/14/2017   No results found for: "PROLACTIN" Lab Results  Component Value Date   CHOL 226 (H) 07/26/2020   TRIG 138 07/26/2020   HDL 44 07/26/2020   CHOLHDL 5.1 07/26/2020   VLDL 28 07/26/2020   LDLCALC 154 (H) 07/26/2020   LDLCALC 103 (H) 04/05/2020   Lab Results  Component Value Date   TSH 2.70 09/12/2021   TSH 2.401 07/26/2020    Therapeutic Level Labs: No results found for: "LITHIUM" No results found for:  "VALPROATE" No results found for: "CBMZ"  Current Medications: Current Outpatient Medications  Medication Sig Dispense Refill   benzonatate (TESSALON) 100 MG capsule Take 1 capsule (100 mg total) by mouth 3 (three) times daily as needed for cough. (Patient not taking: Reported on 01/08/2023) 30 capsule 0   fluvoxaMINE (LUVOX) 100 MG tablet Take 2 tablets (200 mg total) by mouth at bedtime. 180 tablet 1   lamoTRIgine (LAMICTAL) 100 MG tablet Take 1.5 tablets (150 mg total) by mouth at bedtime. 135 tablet 1   Levonorgestrel (KYLEENA IU) by Intrauterine route.     mupirocin cream (BACTROBAN) 2 % Apply 1 application. topically 2 (two) times daily. 15 g 0   ondansetron (ZOFRAN-ODT) 4 MG disintegrating tablet Take 1 tablet (4 mg total) by mouth every 8 (eight) hours as needed for nausea or vomiting. 20 tablet 0   oseltamivir (TAMIFLU) 75 MG capsule Take 1 capsule (75 mg total) by mouth 2 (two) times daily. (Patient not taking: Reported on 01/08/2023) 10 capsule 0   QUEtiapine (SEROQUEL) 100 MG tablet Take 1 tablet (100 mg total) by mouth at bedtime. 90 tablet 0   traZODone (DESYREL) 100 MG tablet Take 1.5 tablets (150 mg total) by mouth at bedtime. 135 tablet 1   No current facility-administered medications for this visit.     Musculoskeletal: Strength & Muscle Tone: unable to assess since visit was over the telemedicine.  Gait & Station: unable to assess since visit was over the telemedicine.  Patient leans: N/A  Psychiatric Specialty Exam: ROSReview of 12 systems negative except as mentioned in HPI   Last menstrual period 01/06/2023.There is no height or weight on file to calculate BMI.  General Appearance: Casual and Well Groomed  Eye Contact:  Good  Speech:  Clear and Coherent and Normal Rate  Volume:  Normal  Mood: "great"  Affect:  Appropriate, Congruent, and Full Range  Thought Process:  Goal Directed and Linear  Orientation:  Full (Time, Place, and Person)  Thought Content: Logical    Suicidal Thoughts:  No  Homicidal Thoughts:  No  Memory:  Immediate;  Fair Recent;   Fair Remote;   Fair  Judgement:  Good  Insight:  Good  Psychomotor Activity:  Normal  Concentration:  Concentration: Good and Attention Span: Good  Recall:  Good  Fund of Knowledge: Good  Language: Good  Akathisia:  No    AIMS (if indicated): not done  Assets:  Communication Skills Desire for Improvement Financial Resources/Insurance Housing Leisure Time Physical Health Social Support Transportation Vocational/Educational  ADL's:  Intact  Cognition: WNL  Sleep:  Good     Screenings: AIMS    Flowsheet Row Admission (Discharged) from 05/13/2017 in [facility]  AIMS Total Score 0      AUDIT    Flowsheet [facility] Ad[facility] (Discharged) from 05/13/2017 in [facility]  Alcohol Use Disorder Identification Test Final Score (AUDIT) 6 0      GAD-7    Flowsheet Row Counselor from 01/02/2023 in [facility] Counselor from 07/30/2022 in [facility] [place] Counselor from 10/04/2021 in [facility] Visit from 04[facility]05/2020 in [facility]  Total GAD-7 Score 15 11 15 7       PHQ2-9    Flowsheet Row Counselor from 02/01/2023 in [facility] Counselor from 01/08/2023 in [facility] Counselor from 01/02/2023 in [facility] Counselor from 07/30/2022 in [facility] [place] Counselor from 10/04/2021 in [facility]  PHQ-2 Total Score 0 6 5 2 3   PHQ-9 Total Score 2 23 23 5 17       Flowsheet Row Counselor from 02/01/2023 in [facility] ED from 01/17/2023  in [facility] Counselor from 01/02/2023 in [facility]  C-SSRS RISK CATEGORY Error: Q3, 4, or 5 should not be populated when Q2 is No No Risk Error: Q3, 4, or 5 should not be populated when Q2 is No        Assessment and Plan:   Myiesha is a 27 yr old Caucasian female who is single, employed, has a history of bipolar disorder, generalized anxiety disorder, insomnia evaluated today for a follow-up visit for medication management.  She completed PHP program for 3 weeks, and appears to have noted significant improvement with mood and anxiety, denies any suicidal thoughts or nonsuicidal self-harm behaviors/thoughts since past few weeks.  She says that despite losing job, she was able to overcome the stress related to it with the help of PHP.  There has been some changes to her medications recently and she has been doing better on them.  Plan as mentioned below.      Plan as below.  Reviewed on 02/07/23   Plan  Bipolar 2 disorder (chronic, stable) Continue with lamotrigine 150 mg daily.  Continue Luvox  200 mg p.o. daily Continue Seroquel 100 mg daily at bedtime    For generalized anxiety disorder-(chronic and stable) -  ind therapy at [facility],  Luvox and wellbutrin as mentioned above.  Medications above will be helpful in decreasing anxiety. Previously tried Prozac upto 10 mg daily and Zoloft 150 mg daily - stopped due to inefficacy.    For insomnia-chronic and stable Continue trazodone 150mg  p.o. nightly Can take Atarax 25-50 mg QHS PRN for sleep.   Labs including CBC, CMP, TSH were ordered by  PCP and appears stable. She is yet to do lipid panel, hemoglobin A1c, vitamin D and B12 levels were ordered , she has not done yet.    This note was generated in part or whole with voice recognition software. Voice recognition is usually quite accurate but there are transcription errors that can and very often do occur. I  apologize for any typographical errors that were not detected and corrected.    MDM = 2 or more chronic stable conditions + med management  Darcel Smalling, MD 02/07/2023, 10:28 AM

## 2023-02-08 ENCOUNTER — Ambulatory Visit (INDEPENDENT_AMBULATORY_CARE_PROVIDER_SITE_OTHER): Payer: Medicaid Other | Admitting: Licensed Clinical Social Worker

## 2023-02-08 DIAGNOSIS — F411 Generalized anxiety disorder: Secondary | ICD-10-CM

## 2023-02-08 DIAGNOSIS — F3181 Bipolar II disorder: Secondary | ICD-10-CM | POA: Diagnosis not present

## 2023-02-08 NOTE — Progress Notes (Signed)
Virtual Visit via Video Note  I connected with Victoria Simmons on 02/08/23 at 10:00 AM EDT by a video enabled telemedicine application and verified that I am speaking with the correct person using two identifiers.  Location: Patient: home Provider: remote office [place], [place])  I discussed the limitations of evaluation and management by telemedicine and the availability of in person appointments. The patient expressed understanding and agreed to proceed.   I discussed the assessment and treatment plan with the patient. The patient was provided an opportunity to ask questions and all were answered. The patient agreed with the plan and demonstrated an understanding of the instructions.   The patient was advised to call back or seek an in-person evaluation if the symptoms worsen or if the condition fails to improve as anticipated.  I provided 30 minutes of non-face-to-face time during this encounter.   Victoria Kuna R Jontay Maston, LCSW  THERAPIST PROGRESS NOTE  Session Time: 10-1030a  Participation Level: Active  Behavioral Response: Neat and Well GroomedAlertAnxious  Type of Therapy: Individual Therapy  Treatment Goals addressed: Problem: Alleviate depressive/manic symptoms and return to improved levels of effective functioning. Goal: LTG: Stabilize mood and increase goal-directed behavior: per pt self report Outcome: Progressing Goal: STG: [place] WILL ATTEND AT LEAST 80% OF SCHEDULED FOLLOW-UP OPT APPOINTMENTS Outcome: Progressing  Interventions: CBT, trauma focused   Summary: Victoria Simmons is a 27 y.o. female who presents with symptoms consistent with bipolar II diagnosis. Pt reports that her mood has stabilized since last session--pt has completed PHP sessions. Pt feels that the group reinforced use of coping skills and she is working harder on self awareness, mindfulness, and emotion regulation.   Explored patients overall focus on wellness. Discussed emotional wellness and  included discussions about improving overall life balance and improvements in self care. Discussed physical wellness and included discussions about improving physical activity (pt currently hiking with friends) and reviewed current nutritional choices and reviewed healthy nutritional choices. Allowed pt to explore what changes and improvements that they feel would be good personal goals without feeling too overwhelmed. Pt committed to change. Pt reports that she has decreased overall THC use, even in the most stressful times.   Allowed pt to explore and identify scenarios or individuals that have triggered workplace stress. Pt states that she was terminated from her job after attending PHP groups. Pt states that they worded that it was over a write up that happened in January.   Allowed pt to explore current relationship concerns and thoughts. Pt reports that there are no regrets and that she is happy that they are currently moving forward with the end of the relationship.   Continued recommendations are as follows: self care behaviors, positive social engagements, focusing on overall work/home/life balance, and focusing on positive physical and emotional wellness.   Suicidal/Homicidal: No currently.  Patient reports that she had suicidal ideation with a plan to hold a knife to her throat in January 2024.  Therapist Response: Pt is continuing to apply interventions learned in session into daily life situations. Pt is currently on track to meet goals utilizing interventions mentioned above. Personal growth and progress noted. Treatment to continue as indicated.   Develop safety plan with patient and referred to IOP/PHP.  Plan: Return again in 1 months  Diagnosis:  Encounter Diagnoses  Name Primary?   Bipolar 2 disorder, major depressive episode (HCC) Yes   GAD (generalized anxiety disorder)    Collaboration of Care: Other pt encouraged to continue care with psychiatrist  of record, Dr. Lorenso Quarry.  Patient/Guardian was advised Release of Information must be obtained prior to any record release in order to collaborate their care with an outside provider. Patient/Guardian was advised if they have not already done so to contact the registration department to sign all necessary forms in order for [name] to release information regarding their care.   Consent: Patient/Guardian gives verbal consent for treatment and assignment of benefits for services provided during this visit. Patient/Guardian expressed understanding and agreed to proceed.    Ernest Haber Michaelia Beilfuss, LCSW 02/08/2023

## 2023-03-02 ENCOUNTER — Other Ambulatory Visit (HOSPITAL_COMMUNITY): Payer: Self-pay

## 2023-03-04 ENCOUNTER — Other Ambulatory Visit: Payer: Self-pay

## 2023-03-05 ENCOUNTER — Other Ambulatory Visit (HOSPITAL_COMMUNITY): Payer: Self-pay

## 2023-03-15 ENCOUNTER — Other Ambulatory Visit (HOSPITAL_COMMUNITY): Payer: Self-pay

## 2023-03-22 ENCOUNTER — Other Ambulatory Visit (HOSPITAL_COMMUNITY): Payer: Self-pay

## 2023-03-22 ENCOUNTER — Telehealth (INDEPENDENT_AMBULATORY_CARE_PROVIDER_SITE_OTHER): Payer: Medicaid Other | Admitting: Child and Adolescent Psychiatry

## 2023-03-22 DIAGNOSIS — F411 Generalized anxiety disorder: Secondary | ICD-10-CM | POA: Diagnosis not present

## 2023-03-22 DIAGNOSIS — F3181 Bipolar II disorder: Secondary | ICD-10-CM

## 2023-03-22 MED ORDER — TRAZODONE HCL 100 MG PO TABS
150.0000 mg | ORAL_TABLET | Freq: Every day | ORAL | 1 refills | Status: DC
  Filled 2023-03-22 – 2023-04-30 (×3): qty 135, 90d supply, fill #0
  Filled 2023-07-26: qty 135, 90d supply, fill #1

## 2023-03-22 MED ORDER — QUETIAPINE FUMARATE 100 MG PO TABS
100.0000 mg | ORAL_TABLET | Freq: Every day | ORAL | 1 refills | Status: DC
  Filled 2023-03-22 – 2023-04-08 (×2): qty 90, 90d supply, fill #0
  Filled 2023-07-02: qty 90, 90d supply, fill #1

## 2023-03-22 MED ORDER — LAMOTRIGINE 100 MG PO TABS
150.0000 mg | ORAL_TABLET | Freq: Every day | ORAL | 1 refills | Status: DC
  Filled 2023-03-22 – 2023-04-15 (×2): qty 135, 90d supply, fill #0

## 2023-03-22 NOTE — Progress Notes (Signed)
Virtual Visit via Video Note  I connected with Victoria Simmons on 03/22/23 at 11:00 AM EDT by a video enabled telemedicine application and verified that I am speaking with the correct person using two identifiers.  Location: Patient: at work Provider: office   I discussed the limitations of evaluation and management by telemedicine and the availability of in person appointments. The patient expressed understanding and agreed to proceed.    I discussed the assessment and treatment plan with the patient. The patient was provided an opportunity to ask questions and all were answered. The patient agreed with the plan and demonstrated an understanding of the instructions.   The patient was advised to call back or seek an in-person evaluation if the symptoms worsen or if the condition fails to improve as anticipated.    Victoria Smalling, MD      [facility] MD/PA/NP OP Progress Note  03/22/2023 10:30 AM Dashanti HOPELYN PINAULT  MRN:  [id number]  Chief Complaint: "I am doing well..."  Synopsis: Victoria Simmons is a 27 year old Caucasian female, employed at [employer] and [employer] store in [place], with history of bipolar disorder type II, GAD, insomnia was seen by Dr. Daleen Bo since 2017 and transition to this writer in April 2020.  She is currently prescribed Lamictal 150 mg once a day, Abilify 10 mg once a day, trazodone 150 mg once a day and Luvox 150 mg once a day.   HPI:   Victoria Simmons was seen and evaluated over telemedicine encounter for medication management follow-up.  In the interim since last appointment, she saw her therapist once and has an appointment tomorrow.  She denies any new concerns for today's appointment and reports that she has been doing well.  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  She says that she works full time, in her free time she has been swimming, hanging out with her friends, and she enjoys these activities.  She  says that her mood has been "great", because she found her job and she is also dating her new girlfriend.  She is also reporting that divorce with her wife is coming through.  She denies any low lows or depressed mood.  She says that she has been eating well, sleep has been more disruptive this week but overall she was sleeping better before that.  She says that she has been compliant with her medications.  She says that she is not anxious as her job is not stressful and it is also helping with finances.  She reports that she has cut down on her marijuana use, continues to smoke about 2 days a week, denies any other substance abuse.  We discussed to continue with current medications and follow-up again in about 6 weeks or earlier if needed.  She verbalized understanding and agreed with this plan.  Visit Diagnosis:    ICD-10-CM   1. Bipolar 2 disorder, major depressive episode (HCC)  F31.81 lamoTRIgine (LAMICTAL) 100 MG tablet    QUEtiapine (SEROQUEL) 100 MG tablet    2. GAD (generalized anxiety disorder)  F41.1         Past Psychiatric History: PPHx reviewed today and as followin. 2 previous inpatient admission at [facility] at the age of 53, and last at [facility], no previous suicide attempts, has history of cutting and her past medication trials include Prozac and Zoloft.  Decreasing Luvox to 50 mg worsened her  symptoms of anxiety therefore it was increased back to 100 mg once a day.   Previous trials of Zoloft 150 mg and Prozac 10 mg stopped due to inefficacy.   Past Medical History:  Past Medical History:  Diagnosis Date   Anxiety    Bipolar 2 disorder (HCC)    Depression    Dysmenorrhea    Hypertriglyceridemia 2020   MDD (major depressive disorder) 06/19/2017   Ovarian torsion 2008    Past Surgical History:  Procedure Laterality Date   COLONOSCOPY  08/2020   polyp, int hem, benign biopsies ([facility])   ESOPHAGOGASTRODUODENOSCOPY  08/2020   LA grade A reflux esophagitis and antral  gastritis, H pylori neg, treated with PPI BID and carafate course ([facility]   OVARY SURGERY  06/30/2007   dx laparoscopy, reduction of right adnexal torsion, bilateral oophoropexy of each ovary to posterior uterine fundus    Family Psychiatric History: As mentioned in initial H&P, reviewed today, no change Family History:  Family History  Problem Relation Age of Onset   Hepatitis B Mother    Anxiety disorder Mother    Hypertension Father    ADD / ADHD Brother    Ovarian cancer Paternal Grandmother     Social History:  Social History   Socioeconomic History   Marital status: Legally Separated    Spouse name: Not on file   Number of children: 0   Years of education: Not on file   Highest education level: Some college, no degree  Occupational History   Occupation: [employer]    Comment: full time  Tobacco Use   Smoking status: Never   Smokeless tobacco: Never  Vaping Use   Vaping Use: Former  Substance and Sexual Activity   Alcohol use: Yes    Comment: twice a month (5-7 drinks)   Drug use: Yes    Types: Marijuana    Comment: last use 16 Aug 2020   Sexual activity: Yes  Other Topics Concern   Not on file  Social History Narrative          Social Determinants of Health   Financial Resource Strain: Low Risk  (08/26/2017)   Overall Financial Resource Strain (CARDIA)    Difficulty of Paying Living Expenses: Not hard at all  Food Insecurity: No Food Insecurity (08/26/2017)   Hunger Vital Sign    Worried About Running Out of Food in the Last Year: Never true    Ran Out of Food in the Last Year: Never true  Transportation Needs: No Transportation Needs (08/26/2017)   PRAPARE - Administrator, Civil Service (Medical): No    Lack of Transportation (Non-Medical): No  Physical Activity: Sufficiently Active (08/26/2017)   Exercise Vital Sign    Days of Exercise per Week: 5 days    Minutes of Exercise per Session: 60 min  Stress: No Stress Concern  Present (08/26/2017)   [employer] of Occupational Health - Occupational Stress Questionnaire    Feeling of Stress : Not at all  Social Connections: Moderately Isolated (08/26/2017)   Social Connection and Isolation Panel [NHANES]    Frequency of Communication with Friends and Family: Three times a week    Frequency of Social Gatherings with Friends and Family: More than three times a week    Attends Religious Services: Never    Database administrator or Organizations: No    Attends Banker Meetings: Never    Marital Status: Never married    Allergies:  No Known Allergies  Metabolic Disorder Labs: Lab Results  Component Value Date   HGBA1C 5.0 07/26/2020   MPG 97 07/26/2020   MPG 94 05/14/2017   No results found for: "PROLACTIN" Lab Results  Component Value Date   CHOL 226 (H) 07/26/2020   TRIG 138 07/26/2020   HDL 44 07/26/2020   CHOLHDL 5.1 07/26/2020   VLDL 28 07/26/2020   LDLCALC 154 (H) 07/26/2020   LDLCALC 103 (H) 04/05/2020   Lab Results  Component Value Date   TSH 2.70 09/12/2021   TSH 2.401 07/26/2020    Therapeutic Level Labs: No results found for: "LITHIUM" No results found for: "VALPROATE" No results found for: "CBMZ"  Current Medications: Current Outpatient Medications  Medication Sig Dispense Refill   benzonatate (TESSALON) 100 MG capsule Take 1 capsule (100 mg total) by mouth 3 (three) times daily as needed for cough. (Patient not taking: Reported on 01/08/2023) 30 capsule 0   fluvoxaMINE (LUVOX) 100 MG tablet Take 2 tablets (200 mg total) by mouth at bedtime. 180 tablet 1   lamoTRIgine (LAMICTAL) 100 MG tablet Take 1.5 tablets (150 mg total) by mouth at bedtime. 135 tablet 1   Levonorgestrel (KYLEENA IU) by Intrauterine route.     mupirocin cream (BACTROBAN) 2 % Apply 1 application. topically 2 (two) times daily. 15 g 0   ondansetron (ZOFRAN-ODT) 4 MG disintegrating tablet Take 1 tablet (4 mg total) by mouth every 8 (eight) hours as  needed for nausea or vomiting. 20 tablet 0   oseltamivir (TAMIFLU) 75 MG capsule Take 1 capsule (75 mg total) by mouth 2 (two) times daily. (Patient not taking: Reported on 01/08/2023) 10 capsule 0   QUEtiapine (SEROQUEL) 100 MG tablet Take 1 tablet (100 mg total) by mouth at bedtime. 90 tablet 1   traZODone (DESYREL) 100 MG tablet Take 1.5 tablets (150 mg total) by mouth at bedtime. 135 tablet 1   No current facility-administered medications for this visit.     Musculoskeletal: Strength & Muscle Tone: unable to assess since visit was over the telemedicine.  Gait & Station: unable to assess since visit was over the telemedicine.  Patient leans: N/A  Psychiatric Specialty Exam: ROSReview of 12 systems negative except as mentioned in HPI   There were no vitals taken for this visit.There is no height or weight on file to calculate BMI.  General Appearance: Casual and Well Groomed  Eye Contact:  Good  Speech:  Clear and Coherent and Normal Rate  Volume:  Normal  Mood: "great..."  Affect:  Appropriate, Congruent, and Full Range  Thought Process:  Goal Directed and Linear  Orientation:  Full (Time, Place, and Person)  Thought Content: Logical   Suicidal Thoughts:  No  Homicidal Thoughts:  No  Memory:  Immediate;   Fair Recent;   Fair Remote;   Fair  Judgement:  Good  Insight:  Good  Psychomotor Activity:  Normal  Concentration:  Concentration: Good and Attention Span: Good  Recall:  Good  Fund of Knowledge: Good  Language: Good  Akathisia:  No    AIMS (if indicated): not done  Assets:  Communication Skills Desire for Improvement Financial Resources/Insurance Housing Leisure Time Physical Health Social Support Transportation Vocational/Educational  ADL's:  Intact  Cognition: WNL  Sleep:  Good     Screenings: AIMS    Flowsheet Row Admission (Discharged) from 05/13/2017 in [facility]  AIMS Total Score 0      AUDIT    Flowsheet Row [facility] Ad[facility] (Discharged) from 05/13/2017 in [facility] INPATIENT BEHAVIORAL MEDICINE  Alcohol Use Disorder Identification Test Final Score (AUDIT) 6 0      GAD-7    Flowsheet Row Counselor from 01/02/2023 in [facility] Counselor from 07/30/2022 in [facility] at [facility] from 10/04/2021 in [facility] Visit from 04/05/2020 in [facility]  Total GAD-7 Score 15 11 15 7       PHQ2-9    Flowsheet Row Counselor from 02/01/2023 in [facility] Counselor from 01/08/2023 in [facility] Counselor from 01/02/2023 in [facility] Counselor from 07/30/2022 in [facility] [place] Counselor from 10/04/2021 in [facility]  PHQ-2 Total Score 0 6 5 2 3   PHQ-9 Total Score 2 23 23 5 17       Flowsheet Row Counselor from 02/08/2023 in [facility] Counselor from 02/01/2023 in [facility] ED from 01/17/2023 in [facility])  C-SSRS RISK CATEGORY Error: Q3, 4, or 5 should not be populated when Q2 is No Error: Q3, 4, or 5 should not be populated when Q2 is No No Risk        Assessment and Plan:   Kynia is a 27 yr old Caucasian female who is single, employed, has a history of bipolar disorder, generalized anxiety disorder, insomnia evaluated today for a follow-up visit for medication management.  She appears to have continued stability with mood and anxiety, and therefore recommending to continue with medication plan as mentioned below.    Plan as below.  Reviewed on 03/22/23   Plan  Bipolar 2 disorder (chronic, stable) Continue  with lamotrigine 150 mg daily.  Continue Luvox  200 mg p.o. daily Continue Seroquel 100 mg daily at bedtime    For generalized anxiety disorder-(chronic and stable) -  ind therapy at [facility],  Luvox as mentioned above.  Medications above will be helpful in decreasing anxiety. Previously tried Prozac upto 10 mg daily and Zoloft 150 mg daily - stopped due to inefficacy.    For insomnia-chronic and stable Continue trazodone 150mg  p.o. nightly Can take Atarax 25-50 mg QHS PRN for sleep.   Labs including CBC, CMP, TSH were ordered by PCP and appears stable. She is yet to do lipid panel, hemoglobin A1c, vitamin D and B12 levels were previously ordered , she has not done yet.    This note was generated in part or whole with voice recognition software. Voice recognition is usually quite accurate but there are transcription errors that can and very often do occur. I apologize for any typographical errors that were not detected and corrected.    MDM = 2 or more chronic stable conditions + med management  Victoria Smalling, MD 03/22/2023, 11:16 AM

## 2023-03-25 ENCOUNTER — Ambulatory Visit (INDEPENDENT_AMBULATORY_CARE_PROVIDER_SITE_OTHER): Payer: Medicaid Other | Admitting: Licensed Clinical Social Worker

## 2023-03-25 DIAGNOSIS — F411 Generalized anxiety disorder: Secondary | ICD-10-CM | POA: Diagnosis not present

## 2023-03-25 DIAGNOSIS — F3181 Bipolar II disorder: Secondary | ICD-10-CM | POA: Diagnosis not present

## 2023-03-25 NOTE — Progress Notes (Signed)
Virtual Visit via Video Note  I connected with YURIDIANA APPLETON on 03/25/23 at  2:00 PM EDT by a video enabled telemedicine application and verified that I am speaking with the correct person using two identifiers.  Location: Patient: home Provider: remote office [place], [place])  I discussed the limitations of evaluation and management by telemedicine and the availability of in person appointments. The patient expressed understanding and agreed to proceed.   I discussed the assessment and treatment plan with the patient. The patient was provided an opportunity to ask questions and all were answered. The patient agreed with the plan and demonstrated an understanding of the instructions.   The patient was advised to call back or seek an in-person evaluation if the symptoms worsen or if the condition fails to improve as anticipated.  I provided 30 minutes of non-face-to-face time during this encounter.   Deija Buhrman R Claude Swendsen, LCSW  THERAPIST PROGRESS NOTE  Session Time: 10-1030a  Participation Level: Active  Behavioral Response: Neat and Well GroomedAlertAnxious  Type of Therapy: Individual Therapy  Treatment Goals addressed: Problem: Alleviate depressive/manic symptoms and return to improved levels of effective functioning. Goal: LTG: Stabilize mood and increase goal-directed behavior: per pt self report Outcome: Progressing Goal: STG: Tiandra WILL ATTEND AT LEAST 80% OF SCHEDULED FOLLOW-UP OPT APPOINTMENTS Outcome: Progressing  Interventions: CBT, trauma focused   Summary: Taralynn SHANEIKA FILIPIAK is a 27 y.o. female who presents with improving symptoms consistent with bipolar II diagnosis. Pt states her mood has been stable and that she has had isolated incidents with anger/anxiety.   Assisted pt with emotion regulation. Pt reports that she had an anger incident recently. Worked carefully with pt to identify triggers prior to the incident. Pt was unable to identify triggers, so clinician  shifted focus to different regulation skills in the moment.  Reviewed emotion regulation strategies, mindfulness, and interpersonal effectiveness.  Discussed impulsive behaviors and ways to improve/delay/manage impulsivity. Pt states she remembers the coping skills she learned in PHP. Encouraged pt to use the skills that are most helpful for her.   Pt has new job--working in  a vape shop. Pt states that she gets to work 7 days per week, which is something that pt wants right now. Pt states that she does get upset with colleagues and sometimes customers, but is happy that she has a job.   Pt reports that she is in a new relationship, which pt is very happy with. Pt reports that her girlfriend is a bit older, which has made a difference. "Its good when your partner has their sh*t together". Pt states that they communicate well together and get to spend quality time with one another frequently.   Pt states plans are to spend time with gf and her family over July 4th. Pt reports that relationships with family members are good.   Continued recommendations are as follows: self care behaviors, positive social engagements, focusing on overall work/home/life balance, and focusing on positive physical and emotional wellness.     Suicidal/Homicidal: No.   Therapist Response: Pt is continuing to apply interventions learned in session into daily life situations. Pt is currently on track to meet goals utilizing interventions mentioned above. Personal growth and progress noted. Treatment to continue as indicated.   Pt actively using coping skills for emotion regulation, mindfulness, and interpersonal effectiveness learned in previous PHP sessions.   Plan: Return again in 1 months  Diagnosis:  Encounter Diagnoses  Name Primary?   Bipolar 2 disorder, major depressive episode (  HCC) Yes   GAD (generalized anxiety disorder)    Collaboration of Care: Other pt encouraged to continue care with psychiatrist of  record, Dr. Lorenso Quarry.  Patient/Guardian was advised Release of Information must be obtained prior to any record release in order to collaborate their care with an outside provider. Patient/Guardian was advised if they have not already done so to contact the registration department to sign all necessary forms in order for [name] to release information regarding their care.   Consent: Patient/Guardian gives verbal consent for treatment and assignment of benefits for services provided during this visit. Patient/Guardian expressed understanding and agreed to proceed.    Ernest Haber Kenza Munar, LCSW 03/25/2023

## 2023-04-08 ENCOUNTER — Other Ambulatory Visit (HOSPITAL_COMMUNITY): Payer: Self-pay

## 2023-04-09 ENCOUNTER — Other Ambulatory Visit: Payer: Self-pay

## 2023-04-09 ENCOUNTER — Other Ambulatory Visit (HOSPITAL_COMMUNITY): Payer: Self-pay

## 2023-04-15 ENCOUNTER — Encounter (HOSPITAL_COMMUNITY): Payer: Self-pay

## 2023-04-15 ENCOUNTER — Other Ambulatory Visit (HOSPITAL_COMMUNITY): Payer: Self-pay

## 2023-04-17 ENCOUNTER — Other Ambulatory Visit (HOSPITAL_COMMUNITY): Payer: Self-pay

## 2023-04-25 ENCOUNTER — Other Ambulatory Visit (HOSPITAL_COMMUNITY): Payer: Self-pay

## 2023-04-30 ENCOUNTER — Other Ambulatory Visit (HOSPITAL_COMMUNITY): Payer: Self-pay

## 2023-05-07 ENCOUNTER — Ambulatory Visit (HOSPITAL_COMMUNITY): Payer: MEDICAID | Admitting: Licensed Clinical Social Worker

## 2023-05-07 DIAGNOSIS — F3181 Bipolar II disorder: Secondary | ICD-10-CM | POA: Diagnosis not present

## 2023-05-07 DIAGNOSIS — F411 Generalized anxiety disorder: Secondary | ICD-10-CM

## 2023-05-07 NOTE — Progress Notes (Signed)
Virtual Visit via Video Note  I connected with Victoria Simmons on 05/07/23 at  3:00 PM EDT by a video enabled telemedicine application and verified that I am speaking with the correct person using two identifiers.  Location: Patient: home Provider: remote office [place], [place])  I discussed the limitations of evaluation and management by telemedicine and the availability of in person appointments. The patient expressed understanding and agreed to proceed.   I discussed the assessment and treatment plan with the patient. The patient was provided an opportunity to ask questions and all were answered. The patient agreed with the plan and demonstrated an understanding of the instructions.   The patient was advised to call back or seek an in-person evaluation if the symptoms worsen or if the condition fails to improve as anticipated.  I provided 29 minutes of non-face-to-face time during this encounter.    Allene Furuya R Rhylen Shaheen, LCSW  THERAPIST PROGRESS NOTE  Session Time: 3-329p  Participation Level: Active  Behavioral Response: Neat and Well GroomedAlertAnxious  Type of Therapy: Individual Therapy  Treatment Goals addressed: Problem: Alleviate depressive/manic symptoms and return to improved levels of effective functioning. Goal: LTG: Stabilize mood and increase goal-directed behavior: per pt self report Outcome: Progressing Goal: STG: Carah WILL ATTEND AT LEAST 80% OF SCHEDULED FOLLOW-UP OPT APPOINTMENTS Outcome: Progressing  Interventions: CBT, trauma focused   Summary: Victoria Simmons is a 27 y.o. female who presents with improving symptoms consistent with bipolar II diagnosis. Pt states her mood has been stable and that she has had isolated incidents with anger/anxiety.   Explored current levels of mood and overall energy. Discussed depression symptoms and medication compliance. Used motivational interviewing techniques to encourage pt to increase activity through the day, focus  on overall prosocial behaviors, and engage in cognitively stimulating activities. Discussed cognitive distortions, automatic negative thoughts, and discussed using positive self talk and reframing.  Pt is able to identify negative thoughts and reframe.   Allowed pt to explore current relationship -- pt continues to be very happy with the progress of her new relationship. .  Explored current communication skills that both parties are using and discussed ways/alternatives to improve communication. Discussed healthy conflict resolution.   Pt reports that she is currently in the divorce process from previous marriage--just needs to get a court date. Pt happy w/ progress. Allowed pt space to reflect on the past 3 years and recognize how she has used her coping skills to manage through several difficult times.   Continued recommendations are as follows: self care behaviors, positive social engagements, focusing on overall work/home/life balance, and focusing on positive physical and emotional wellness.     Suicidal/Homicidal: No.   Therapist Response: Pt is continuing to apply interventions learned in session into daily life situations. Pt is currently on track to meet goals utilizing interventions mentioned above. Personal growth and progress noted. Treatment to continue as indicated.   Pt actively using coping skills for emotion regulation, mindfulness, and interpersonal effectiveness learned in previous PHP sessions.   Plan: Informed patient that clinician will be leaving outpatient department. Allowed pt to explore any questions or concerns and discussed future counseling options/resources. Provided pt with psychoeducational resources and list of OPT therapists. Encouraged pt to continue with psychiatric med management appointments, if applicable.    Diagnosis:  Encounter Diagnoses  Name Primary?   Bipolar 2 disorder, major depressive episode (HCC) Yes   GAD (generalized anxiety disorder)      Collaboration of Care: Other pt encouraged to  continue care with psychiatrist of record, Dr. Lorenso Quarry.  Patient/Guardian was advised Release of Information must be obtained prior to any record release in order to collaborate their care with an outside provider. Patient/Guardian was advised if they have not already done so to contact the registration department to sign all necessary forms in order for [name] to release information regarding their care.   Consent: Patient/Guardian gives verbal consent for treatment and assignment of benefits for services provided during this visit. Patient/Guardian expressed understanding and agreed to proceed.    Ernest Haber Bostyn Kunkler, LCSW 05/07/2023

## 2023-05-13 ENCOUNTER — Telehealth: Payer: MEDICAID | Admitting: Child and Adolescent Psychiatry

## 2023-05-13 NOTE — Progress Notes (Unsigned)
27 y.o. G0P0000 Legally Separated Caucasian female here for annual exam.    PCP:     No LMP recorded. (Menstrual status: IUD).           Sexually active: {yes no:314532}  The current method of family planning is IUD.    Exercising: {yes no:314532}  {types:19826} Smoker:  no  Health Maintenance: Pap:  11/21/17 neg History of abnormal Pap:  no MMG:  n/a Colonoscopy:  08/25/20 BMD:   n/a  Result  n/a TDaP:  04/30/08 Gardasil:   yes HIV: 12/08/18 NR Hep C: 12/08/18 neg Screening Labs:  Hb today: ***, Urine today: ***   reports that she has never smoked. She has never used smokeless tobacco. She reports current alcohol use. She reports current drug use. Drug: Marijuana.  Past Medical History:  Diagnosis Date   Anxiety    Bipolar 2 disorder (HCC)    Depression    Dysmenorrhea    Hypertriglyceridemia 2020   MDD (major depressive disorder) 06/19/2017   Ovarian torsion 2008    Past Surgical History:  Procedure Laterality Date   COLONOSCOPY  08/2020   polyp, int hem, benign biopsies ([facility])   ESOPHAGOGASTRODUODENOSCOPY  08/2020   LA grade A reflux esophagitis and antral gastritis, H pylori neg, treated with PPI BID and carafate course ([facility]   OVARY SURGERY  06/30/2007   dx laparoscopy, reduction of right adnexal torsion, bilateral oophoropexy of each ovary to posterior uterine fundus    Current Outpatient Medications  Medication Sig Dispense Refill   benzonatate (TESSALON) 100 MG capsule Take 1 capsule (100 mg total) by mouth 3 (three) times daily as needed for cough. (Patient not taking: Reported on 01/08/2023) 30 capsule 0   fluvoxaMINE (LUVOX) 100 MG tablet Take 2 tablets (200 mg total) by mouth at bedtime. 180 tablet 1   lamoTRIgine (LAMICTAL) 100 MG tablet Take 1&1/2 tablets (150 mg total) by mouth at bedtime. 135 tablet 1   Levonorgestrel (KYLEENA IU) by Intrauterine route.     mupirocin cream (BACTROBAN) 2 % Apply 1 application. topically 2 (two) times daily. 15 g  0   ondansetron (ZOFRAN-ODT) 4 MG disintegrating tablet Take 1 tablet (4 mg total) by mouth every 8 (eight) hours as needed for nausea or vomiting. 20 tablet 0   oseltamivir (TAMIFLU) 75 MG capsule Take 1 capsule (75 mg total) by mouth 2 (two) times daily. (Patient not taking: Reported on 01/08/2023) 10 capsule 0   QUEtiapine (SEROQUEL) 100 MG tablet Take 1 tablet (100 mg total) by mouth at bedtime. 90 tablet 1   traZODone (DESYREL) 100 MG tablet Take 1.5 tablets (150 mg total) by mouth at bedtime. 135 tablet 1   No current facility-administered medications for this visit.    Family History  Problem Relation Age of Onset   Hepatitis B Mother    Anxiety disorder Mother    Hypertension Father    ADD / ADHD Brother    Ovarian cancer Paternal Grandmother     Review of Systems  Exam:   There were no vitals taken for this visit.    General appearance: alert, cooperative and appears stated age Head: normocephalic, without obvious abnormality, atraumatic Neck: no adenopathy, supple, symmetrical, trachea midline and thyroid normal to inspection and palpation Lungs: clear to auscultation bilaterally Breasts: normal appearance, no masses or tenderness, No nipple retraction or dimpling, No nipple discharge or bleeding, No axillary adenopathy Heart: regular rate and rhythm Abdomen: soft, non-tender; no masses, no organomegaly Extremities: extremities normal, atraumatic,  no cyanosis or edema Skin: skin color, texture, turgor normal. No rashes or lesions Lymph nodes: cervical, supraclavicular, and axillary nodes normal. Neurologic: grossly normal  Pelvic: External genitalia:  no lesions              No abnormal inguinal nodes palpated.              Urethra:  normal appearing urethra with no masses, tenderness or lesions              Bartholins and Skenes: normal                 Vagina: normal appearing vagina with normal color and discharge, no lesions              Cervix: no lesions               Pap taken: {yes no:314532} Bimanual Exam:  Uterus:  normal size, contour, position, consistency, mobility, non-tender              Adnexa: no mass, fullness, tenderness              Rectal exam: {yes no:314532}.  Confirms.              Anus:  normal sphincter tone, no lesions  Chaperone was present for exam:  ***  Assessment:   Well woman visit with gynecologic exam.   Plan: Mammogram screening discussed. Self breast awareness reviewed. Pap and HR HPV as above. Guidelines for Calcium, Vitamin D, regular exercise program including cardiovascular and weight bearing exercise.   Follow up annually and prn.   Additional counseling given.  {yes [id number]. _______ minutes face to face time of which over 50% was spent in counseling.    After visit summary provided.

## 2023-05-14 ENCOUNTER — Other Ambulatory Visit (HOSPITAL_COMMUNITY)
Admission: RE | Admit: 2023-05-14 | Discharge: 2023-05-14 | Disposition: A | Discharge status: Home / Self Care | Payer: MEDICAID | Source: Ambulatory Visit | Attending: Obstetrics and Gynecology | Admitting: Obstetrics and Gynecology

## 2023-05-14 ENCOUNTER — Ambulatory Visit (INDEPENDENT_AMBULATORY_CARE_PROVIDER_SITE_OTHER): Payer: MEDICAID | Admitting: Obstetrics and Gynecology

## 2023-05-14 ENCOUNTER — Encounter: Payer: Self-pay | Admitting: Obstetrics and Gynecology

## 2023-05-14 VITALS — BP 124/78 | HR 79 | Ht 66.0 in | Wt 200.0 lb

## 2023-05-14 DIAGNOSIS — Z30432 Encounter for removal of intrauterine contraceptive device: Secondary | ICD-10-CM

## 2023-05-14 DIAGNOSIS — Z113 Encounter for screening for infections with a predominantly sexual mode of transmission: Secondary | ICD-10-CM

## 2023-05-14 DIAGNOSIS — Z1159 Encounter for screening for other viral diseases: Secondary | ICD-10-CM

## 2023-05-14 DIAGNOSIS — Z23 Encounter for immunization: Secondary | ICD-10-CM | POA: Diagnosis not present

## 2023-05-14 DIAGNOSIS — Z01419 Encounter for gynecological examination (general) (routine) without abnormal findings: Secondary | ICD-10-CM | POA: Diagnosis not present

## 2023-05-14 DIAGNOSIS — Z124 Encounter for screening for malignant neoplasm of cervix: Secondary | ICD-10-CM

## 2023-05-14 DIAGNOSIS — Z114 Encounter for screening for human immunodeficiency virus [HIV]: Secondary | ICD-10-CM

## 2023-05-14 NOTE — Patient Instructions (Signed)

## 2023-05-16 ENCOUNTER — Other Ambulatory Visit: Payer: Self-pay

## 2023-05-16 MED ORDER — METRONIDAZOLE 500 MG PO TABS: 500.0000 mg | ORAL_TABLET | Freq: Two times a day (BID) | ORAL | 0 refills | Status: DC

## 2023-05-17 ENCOUNTER — Telehealth: Payer: MEDICAID | Admitting: Child and Adolescent Psychiatry

## 2023-06-16 ENCOUNTER — Other Ambulatory Visit: Payer: Self-pay | Admitting: Family Medicine

## 2023-06-16 DIAGNOSIS — F3181 Bipolar II disorder: Secondary | ICD-10-CM

## 2023-06-16 DIAGNOSIS — N921 Excessive and frequent menstruation with irregular cycle: Secondary | ICD-10-CM

## 2023-06-16 DIAGNOSIS — Z79899 Other long term (current) drug therapy: Secondary | ICD-10-CM | POA: Insufficient documentation

## 2023-06-16 DIAGNOSIS — R197 Diarrhea, unspecified: Secondary | ICD-10-CM

## 2023-06-16 DIAGNOSIS — E785 Hyperlipidemia, unspecified: Secondary | ICD-10-CM

## 2023-06-18 ENCOUNTER — Other Ambulatory Visit (INDEPENDENT_AMBULATORY_CARE_PROVIDER_SITE_OTHER): Payer: MEDICAID

## 2023-06-18 DIAGNOSIS — E785 Hyperlipidemia, unspecified: Secondary | ICD-10-CM

## 2023-06-18 DIAGNOSIS — F3181 Bipolar II disorder: Secondary | ICD-10-CM

## 2023-06-18 DIAGNOSIS — N921 Excessive and frequent menstruation with irregular cycle: Secondary | ICD-10-CM | POA: Diagnosis not present

## 2023-06-18 DIAGNOSIS — R197 Diarrhea, unspecified: Secondary | ICD-10-CM | POA: Diagnosis not present

## 2023-06-18 DIAGNOSIS — Z79899 Other long term (current) drug therapy: Secondary | ICD-10-CM | POA: Diagnosis not present

## 2023-06-18 LAB — TSH: TSH: 4.67 u[IU]/mL (ref 0.35–5.50)

## 2023-06-18 LAB — CBC WITH DIFFERENTIAL/PLATELET
Basophils Absolute: 0.1 10*3/uL (ref 0.0–0.1)
Basophils Relative: 1.4 % (ref 0.0–3.0)
Eosinophils Absolute: 0.2 10*3/uL (ref 0.0–0.7)
Eosinophils Relative: 2.6 % (ref 0.0–5.0)
HCT: 43.9 % (ref 36.0–46.0)
Hemoglobin: 14.2 g/dL (ref 12.0–15.0)
Lymphocytes Relative: 33.4 % (ref 12.0–46.0)
Lymphs Abs: 2.5 10*3/uL (ref 0.7–4.0)
MCHC: 32.4 g/dL (ref 30.0–36.0)
MCV: 86.8 fl (ref 78.0–100.0)
Monocytes Absolute: 0.6 10*3/uL (ref 0.1–1.0)
Monocytes Relative: 8.1 % (ref 3.0–12.0)
Neutro Abs: 4.1 10*3/uL (ref 1.4–7.7)
Neutrophils Relative %: 54.5 % (ref 43.0–77.0)
Platelets: 243 10*3/uL (ref 150.0–400.0)
RBC: 5.06 Mil/uL (ref 3.87–5.11)
RDW: 13.9 % (ref 11.5–15.5)
WBC: 7.4 10*3/uL (ref 4.0–10.5)

## 2023-06-18 LAB — COMPREHENSIVE METABOLIC PANEL
ALT: 17 U/L (ref 0–35)
AST: 13 U/L (ref 0–37)
Albumin: 4.2 g/dL (ref 3.5–5.2)
Alkaline Phosphatase: 64 U/L (ref 39–117)
BUN: 16 mg/dL (ref 6–23)
CO2: 23 meq/L (ref 19–32)
Calcium: 9.3 mg/dL (ref 8.4–10.5)
Chloride: 107 meq/L (ref 96–112)
Creatinine, Ser: 1.06 mg/dL (ref 0.40–1.20)
GFR: 72.34 mL/min (ref >60.00)
Glucose, Bld: 88 mg/dL (ref 70–99)
Potassium: 3.8 meq/L (ref 3.5–5.1)
Sodium: 140 meq/L (ref 135–145)
Total Bilirubin: 0.3 mg/dL (ref 0.2–1.2)
Total Protein: 7 g/dL (ref 6.0–8.3)

## 2023-06-18 LAB — LIPID PANEL
Cholesterol: 206 mg/dL — ABNORMAL HIGH (ref 0–200)
HDL: 42.2 mg/dL (ref >39.00)
LDL Cholesterol: 125 mg/dL — ABNORMAL HIGH (ref 0–99)
NonHDL: 163.82
Total CHOL/HDL Ratio: 5
Triglycerides: 193 mg/dL — ABNORMAL HIGH (ref 0.0–149.0)
VLDL: 38.6 mg/dL (ref 0.0–40.0)

## 2023-06-18 LAB — SEDIMENTATION RATE: Sed Rate: 14 mm/h (ref 0–20)

## 2023-06-19 ENCOUNTER — Other Ambulatory Visit (INDEPENDENT_AMBULATORY_CARE_PROVIDER_SITE_OTHER): Payer: MEDICAID

## 2023-06-19 DIAGNOSIS — R7989 Other specified abnormal findings of blood chemistry: Secondary | ICD-10-CM | POA: Diagnosis not present

## 2023-06-19 LAB — T4, FREE: Free T4: 0.84 ng/dL (ref 0.60–1.60)

## 2023-06-25 ENCOUNTER — Telehealth: Payer: Self-pay | Admitting: Family Medicine

## 2023-06-25 ENCOUNTER — Ambulatory Visit (INDEPENDENT_AMBULATORY_CARE_PROVIDER_SITE_OTHER): Payer: MEDICAID | Admitting: Family Medicine

## 2023-06-25 ENCOUNTER — Encounter: Payer: Self-pay | Admitting: Family Medicine

## 2023-06-25 VITALS — BP 112/60 | HR 90 | Temp 97.6°F | Ht 65.75 in | Wt 191.0 lb

## 2023-06-25 DIAGNOSIS — Z0001 Encounter for general adult medical examination with abnormal findings: Secondary | ICD-10-CM | POA: Diagnosis not present

## 2023-06-25 DIAGNOSIS — K130 Diseases of lips: Secondary | ICD-10-CM

## 2023-06-25 DIAGNOSIS — F3181 Bipolar II disorder: Secondary | ICD-10-CM

## 2023-06-25 DIAGNOSIS — R112 Nausea with vomiting, unspecified: Secondary | ICD-10-CM

## 2023-06-25 DIAGNOSIS — F411 Generalized anxiety disorder: Secondary | ICD-10-CM | POA: Diagnosis not present

## 2023-06-25 DIAGNOSIS — E739 Lactose intolerance, unspecified: Secondary | ICD-10-CM | POA: Insufficient documentation

## 2023-06-25 DIAGNOSIS — E785 Hyperlipidemia, unspecified: Secondary | ICD-10-CM

## 2023-06-25 MED ORDER — FAMOTIDINE 20 MG PO TABS: 20.0000 mg | ORAL_TABLET | Freq: Every day | ORAL | Status: DC

## 2023-06-25 MED ORDER — TRIAMCINOLONE ACETONIDE 0.1 % MT PSTE: 1.0000 | PASTE | Freq: Two times a day (BID) | OROMUCOSAL | 0 refills | Status: AC

## 2023-06-25 MED ORDER — ONDANSETRON 4 MG PO TBDP: 4.0000 mg | ORAL_TABLET | Freq: Three times a day (TID) | ORAL | 0 refills | Status: DC | PRN

## 2023-06-25 NOTE — Patient Instructions (Addendum)
We will refer you back to GI to discuss stomach emptying study.  Restart pepcid 20mg  nightly. If that doesn't help, let me know for prescription heartburn medicine.  Start multivitamin. May try oragel OTC and triamcinolone past sent to pharmacy. Let us know if no better for further treatment.  Return as needed or in 1 year for next physical.

## 2023-06-25 NOTE — Assessment & Plan Note (Signed)
Chronic nausea with intermittent episodes of vomiting, about 4-5/month, also notes intermittent constipation.  Reviewed previous GI workup 2021 - colonoscopy with 1 TA rpt 5 yrs, EGD with LA Grade A reflux esophagitis and gastritis - according to patient, PPI + carafate didn't help symptoms. Plan at that time was to proceed with stomach emptying study, but pt lost to follow up.  Rec start pepcid 20mg  daily.  Will re-refer to GI for further evaluation of this chronic issue.

## 2023-06-25 NOTE — Assessment & Plan Note (Addendum)
Preventative protocols reviewed and updated unless pt declined. Discussed healthy diet and lifestyle.  To start new job at daycare. Physical form filled out

## 2023-06-25 NOTE — Assessment & Plan Note (Signed)
Reviewed diet choices to improve cholesterol levels.  The ASCVD Risk score (Arnett DK, et al., 2019) failed to calculate for the following reasons:   The 2019 ASCVD risk score is only valid for ages 19 to 36

## 2023-06-25 NOTE — Telephone Encounter (Signed)
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  Can we call her and ask her to come in for Tdap at her convenience? Thanks.

## 2023-06-25 NOTE — Telephone Encounter (Signed)
Spoke with pt. She has been scheduled for a nurse visit on 07/03/2023 at 2:30pm. Nothing further was needed.

## 2023-06-25 NOTE — Assessment & Plan Note (Signed)
Stable period on current regimen followed by psychiatry and psychology. Appreciate their care.

## 2023-06-25 NOTE — Progress Notes (Signed)
Ph: [phone] Fax: [phone]   Patient ID: Victoria Simmons, female    DOB: 1996-04-03, 27 y.o.   MRN: [id number]  This visit was conducted in person.  BP 112/60   Pulse 90   Temp 97.6 F (36.4 C) (Temporal)   Ht 5' 5.75" (1.67 m)   Wt 191 lb (86.6 kg)   SpO2 98%   BMI 31.06 kg/m    No results found.   CC: CPE Subjective:   HPI: Victoria Simmons is a 27 y.o. female presenting on 06/25/2023 for Annual Exam (Has physical form for work labs printed for review )   ESOPHAGOGASTRODUODENOSCOPY 08/2020 - LA grade A reflux esophagitis and antral gastritis, H pylori neg, treated with PPI BID and carafate course ([facility])   PPI didn't help. Carafate didn't help.   Bipolar 2 - managed with fluvoaxmine 200mg  nightly, lamictal 150mg  nightly, seroquel 100mg  nightly and trazodone 150mg  nightly. She regularly sees psychiatry Dr Jerold Coombe and counselor. Doesn't drive due to anxiety.   Lactose intolerant - avoiding milk  Notes intermittent nausea of unclear cause.  Notes worsening heartburn over the past 3 months.   Over the past month having recurrent ulcers to lower lip. Feels different from cold sore. Carmex hasn't helped. No drainage, redness or warmth.   Preventative: COLONOSCOPY 08/2020 - TA, int hem, benign biopsies, rpt 5 yrs ([facility]) Lung cancer screening - not eligible Breast cancer screening - not due Well woman exam with GYN Dr Pearlean Brownie last seen 05/2023. IUD removed. H/o ovarian torsion s/p bilateral oophoropexy. Last pap 05/2023 WNL, with BV s/p treatment.  DEXA scan - not due Flu shot - declines COVID shot - 02/2020 ([facility]) Tdap 2009, update today  Pneumonia shot - not due Shingrix - not due Advanced directive discussion - not discussed Seat belt use discussed Doesn't drive - no license  Sunscreen use discussed. No changing moles on skin. Non smoker - does vape Alcohol - rare  Dentist - earlier this year  Eye exam - has not needed to see  Lives with  parents, 5 dogs  Occ: daycare  Activity: enjoys hikes Diet: good water, fruits/vegetables daily      Relevant past medical, surgical, family and social history reviewed and updated as indicated. Interim medical history since our last visit reviewed. Allergies and medications reviewed and updated. Outpatient Medications Prior to Visit  Medication Sig Dispense Refill   fluvoxaMINE (LUVOX) 100 MG tablet Take 2 tablets (200 mg total) by mouth at bedtime. 180 tablet 1   lamoTRIgine (LAMICTAL) 100 MG tablet Take 1&1/2 tablets (150 mg total) by mouth at bedtime. 135 tablet 1   QUEtiapine (SEROQUEL) 100 MG tablet Take 1 tablet (100 mg total) by mouth at bedtime. 90 tablet 1   traZODone (DESYREL) 100 MG tablet Take 1.5 tablets (150 mg total) by mouth at bedtime. 135 tablet 1   ondansetron (ZOFRAN-ODT) 4 MG disintegrating tablet Take 1 tablet (4 mg total) by mouth every 8 (eight) hours as needed for nausea or vomiting. 20 tablet 0   Levonorgestrel (KYLEENA IU) by Intrauterine route.     metroNIDAZOLE (FLAGYL) 500 MG tablet Take 1 tablet (500 mg total) by mouth 2 (two) times daily. 14 tablet 0   mupirocin cream (BACTROBAN) 2 % Apply 1 application. topically 2 (two) times daily. 15 g 0   No facility-administered medications prior to visit.     Per HPI unless specifically indicated in ROS section below Review of Systems  Constitutional:  Positive for appetite  change (decreased). Negative for activity change, chills, fatigue, fever and unexpected weight change.  HENT:  Negative for hearing loss.   Eyes:  Negative for visual disturbance.  Respiratory:  Negative for cough, chest tightness, shortness of breath and wheezing.   Cardiovascular:  Negative for chest pain, palpitations and leg swelling.  Gastrointestinal:  Positive for abdominal pain, constipation, nausea and vomiting (a few times a month). Negative for abdominal distention, blood in stool and diarrhea.  Genitourinary:  Negative for  difficulty urinating and hematuria.  Musculoskeletal:  Negative for arthralgias, myalgias and neck pain.  Skin:  Negative for rash.  Neurological:  Positive for dizziness (occ). Negative for seizures, syncope and headaches.  Hematological:  Negative for adenopathy. Does not bruise/bleed easily.  Psychiatric/Behavioral:  Negative for dysphoric mood. The patient is nervous/anxious.     Objective:  BP 112/60   Pulse 90   Temp 97.6 F (36.4 C) (Temporal)   Ht 5' 5.75" (1.67 m)   Wt 191 lb (86.6 kg)   SpO2 98%   BMI 31.06 kg/m   Wt Readings from Last 3 Encounters:  06/25/23 191 lb (86.6 kg)  05/14/23 200 lb (90.7 kg)  12/25/22 200 lb (90.7 kg)      Physical Exam Vitals and nursing note reviewed.  Constitutional:      Appearance: Normal appearance. She is not ill-appearing.  HENT:     Head: Normocephalic and atraumatic.     Right Ear: Tympanic membrane, ear canal and external ear normal. There is no impacted cerumen.     Left Ear: Tympanic membrane, ear canal and external ear normal. There is no impacted cerumen.     Mouth/Throat:     Mouth: Mucous membranes are moist.     Pharynx: Oropharynx is clear. No oropharyngeal exudate or posterior oropharyngeal erythema.  Eyes:     General:        Right eye: No discharge.        Left eye: No discharge.     Extraocular Movements: Extraocular movements intact.     Conjunctiva/sclera: Conjunctivae normal.     Pupils: Pupils are equal, round, and reactive to light.  Neck:     Thyroid: No thyroid mass or thyromegaly.  Cardiovascular:     Rate and Rhythm: Normal rate and regular rhythm.     Pulses: Normal pulses.     Heart sounds: Normal heart sounds. No murmur heard. Pulmonary:     Effort: Pulmonary effort is normal. No respiratory distress.     Breath sounds: Normal breath sounds. No wheezing, rhonchi or rales.  Abdominal:     General: Bowel sounds are normal. There is no distension.     Palpations: Abdomen is soft. There is no  mass.     Tenderness: There is no abdominal tenderness. There is no guarding or rebound.     Hernia: No hernia is present.  Musculoskeletal:     Cervical back: Normal range of motion and neck supple. No rigidity.     Right lower leg: No edema.     Left lower leg: No edema.  Lymphadenopathy:     Cervical: No cervical adenopathy.  Skin:    General: Skin is warm and dry.     Findings: No rash.     Comments: Ulcers to lower lip  Neurological:     General: No focal deficit present.     Mental Status: She is alert. Mental status is at baseline.  Psychiatric:        Mood  and Affect: Mood normal.        Behavior: Behavior normal.       Results for orders placed or performed in visit on 06/19/23  T4, free  Result Value Ref Range   Free T4 0.84 0.60 - 1.60 ng/dL      11/21/863   78:46 PM 02/01/2023   11:19 AM 01/08/2023   11:32 AM 01/02/2023    1:26 PM 07/30/2022    9:16 AM  Depression screen PHQ 2/9  Decreased Interest 0      Down, Depressed, Hopeless 0      PHQ - 2 Score 0      Altered sleeping 1      Tired, decreased energy 0      Change in appetite 1      Feeling bad or failure about yourself  1      Trouble concentrating 0      Moving slowly or fidgety/restless 0      Suicidal thoughts       PHQ-9 Score 3      Difficult doing work/chores Somewhat difficult         Information is confidential and restricted. Go to Review Flowsheets to unlock data.       06/25/2023   12:14 PM 01/02/2023    1:29 PM 07/30/2022    9:18 AM 10/04/2021    9:17 AM  GAD 7 : Generalized Anxiety Score  Nervous, Anxious, on Edge 0     Control/stop worrying 0     Worry too much - different things 0     Trouble relaxing 1     Restless 1     Easily annoyed or irritable 0     Afraid - awful might happen 0     Total GAD 7 Score 2     Anxiety Difficulty Not difficult at all        Information is confidential and restricted. Go to Review Flowsheets to unlock data.   Assessment & Plan:    Problem List Items Addressed This Visit     Encounter for general adult medical examination with abnormal findings - Primary (Chronic)    Preventative protocols reviewed and updated unless pt declined. Discussed healthy diet and lifestyle.  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 Appreciate their care.       GAD (generalized anxiety disorder)   HLD (hyperlipidemia)    Reviewed diet choices to improve cholesterol levels.  The ASCVD Risk score (Arnett DK, et al., 2019) failed to calculate for the following reasons:   The 2019 ASCVD risk score is only valid for ages 58 to 30       Sore of lower lip    ?canker sore - Rx oragel + triamcinolone paste. Update if not improved with this.  Not consistent with bacterial or fungal infection. No further oral or other mucosal lesions or skin rash.       Nausea & vomiting    Chronic nausea with intermittent episodes of vomiting, about 4-5/month, also notes intermittent constipation.  Reviewed previous GI workup 2021 - colonoscopy with 1 TA rpt 5 yrs, EGD with LA Grade A reflux esophagitis and gastritis - according to patient, PPI + carafate didn't help symptoms. Plan at that time was to proceed with stomach emptying study, but pt lost to follow  up.  Rec start pepcid 20mg  daily.  Will re-refer to GI for further evaluation of this chronic issue.       Lactose intolerance     Meds ordered this encounter  Medications   famotidine (PEPCID) 20 MG tablet    Sig: Take 1 tablet (20 mg total) by mouth at bedtime.   ondansetron (ZOFRAN-ODT) 4 MG disintegrating tablet    Sig: Take 1 tablet (4 mg total) by mouth every 8 (eight) hours as needed for nausea or vomiting.    Dispense:  20 tablet    Refill:  0   triamcinolone (KENALOG) 0.1 % paste    Sig: Use as directed 1 Application in the mouth or throat 2  (two) times daily.    Dispense:  5 g    Refill:  0    No orders of the defined types were placed in this encounter.   Patient Instructions  We will refer you back to GI to discuss stomach emptying study.  Restart pepcid 20mg  nightly. If that doesn't help, let me know for prescription heartburn medicine.  Start multivitamin. May try oragel OTC and triamcinolone past sent to pharmacy. Let us know if no better for further treatment.  Return as needed or in 1 year for next physical.   Follow up plan: Return in about 1 year (around 06/24/2024) for annual exam, prior fasting for blood work.  Eustaquio Boyden, MD

## 2023-06-25 NOTE — Assessment & Plan Note (Signed)
?  canker sore - Rx oragel + triamcinolone paste. Update if not improved with this.  Not consistent with bacterial or fungal infection. No further oral or other mucosal lesions or skin rash.

## 2023-07-03 ENCOUNTER — Ambulatory Visit: Payer: MEDICAID

## 2023-07-03 ENCOUNTER — Other Ambulatory Visit (HOSPITAL_COMMUNITY): Payer: Self-pay

## 2023-07-15 ENCOUNTER — Ambulatory Visit: Payer: MEDICAID | Admitting: Family Medicine

## 2023-07-15 ENCOUNTER — Encounter: Payer: Self-pay | Admitting: Family Medicine

## 2023-07-15 NOTE — Progress Notes (Deleted)
New Patient Office Visit  Subjective    Patient ID: Victoria Simmons, female    DOB: 1996-08-07  Age: 27 y.o. MRN: [id number]  CC: No chief complaint on file.   HPI Victoria Simmons presents to establish care Patient just had a physical with Dr. Sharen Hones in September.  Bipolar disorder-psychiatry and psychology.  Nausea vomiting-referral was sent to GI?  Gastric emptying study  Elevated LDL in September.  Otherwise normal labs.  PMH: ***  PSH: ***  FH: ***  Tobacco use: *** Alcohol use: *** Drug use: *** Marital status: *** Employment: *** Sexual hx: ***  Screenings:  Colon Cancer: *** Lung Cancer: *** Breast Cancer: *** Diabetes: *** HLD: ***   Outpatient Encounter Medications as of 07/15/2023  Medication Sig   famotidine (PEPCID) 20 MG tablet Take 1 tablet (20 mg total) by mouth at bedtime.   fluvoxaMINE (LUVOX) 100 MG tablet Take 2 tablets (200 mg total) by mouth at bedtime.   lamoTRIgine (LAMICTAL) 100 MG tablet Take 1&1/2 tablets (150 mg total) by mouth at bedtime.   Levonorgestrel (KYLEENA IU) by Intrauterine route.   ondansetron (ZOFRAN-ODT) 4 MG disintegrating tablet Take 1 tablet (4 mg total) by mouth every 8 (eight) hours as needed for nausea or vomiting.   QUEtiapine (SEROQUEL) 100 MG tablet Take 1 tablet (100 mg total) by mouth at bedtime.   traZODone (DESYREL) 100 MG tablet Take 1.5 tablets (150 mg total) by mouth at bedtime.   triamcinolone (KENALOG) 0.1 % paste Use as directed 1 Application in the mouth or throat 2 (two) times daily.   [DISCONTINUED] dicyclomine (BENTYL) 10 MG capsule Take 1 capsule (10 mg total) by mouth 4 (four) times daily as needed for spasms. (Patient not taking: Reported on 07/26/2020)   No facility-administered encounter medications on file as of 07/15/2023.    Past Medical History:  Diagnosis Date   Anxiety    Bipolar 2 disorder (HCC)    Depression    Dysmenorrhea    Hypertriglyceridemia 2020   MDD (major depressive  disorder) 06/19/2017   Ovarian torsion 2008    Past Surgical History:  Procedure Laterality Date   COLONOSCOPY  08/2020   polyp, int hem, benign biopsies ([facility])   ESOPHAGOGASTRODUODENOSCOPY  08/2020   LA grade A reflux esophagitis and antral gastritis, H pylori neg, treated with PPI BID and carafate course ([facility]   OVARY SURGERY  06/30/2007   dx laparoscopy, reduction of right adnexal torsion, bilateral oophoropexy of each ovary to posterior uterine fundus    Family History  Problem Relation Age of Onset   Hepatitis B Mother    Anxiety disorder Mother    Hypertension Father    ADD / ADHD Brother    Ovarian cancer Paternal Grandmother     Social History   Socioeconomic History   Marital status: Legally Separated    Spouse name: Not on file   Number of children: 0   Years of education: Not on file   Highest education level: Some college, no degree  Occupational History   Occupation: [employer]    Comment: full time  Tobacco Use   Smoking status: Never   Smokeless tobacco: Never  Vaping Use   Vaping status: Former  Substance and Sexual Activity   Alcohol use: Yes    Comment: twice a month (5-7 drinks)   Drug use: Yes    Types: Marijuana    Comment: last use 16 Aug 2020   Sexual activity: Yes  Other Topics Concern   Not on file  Social History Narrative          Social Determinants of Health   Financial Resource Strain: Low Risk  (08/26/2017)   Overall Financial Resource Strain (CARDIA)    Difficulty of Paying Living Expenses: Not hard at all  Food Insecurity: No Food Insecurity (08/26/2017)   Hunger Vital Sign    Worried About Running Out of Food in the Last Year: Never true    Ran Out of Food in the Last Year: Never true  Transportation Needs: No Transportation Needs (08/26/2017)   PRAPARE - Administrator, Civil Service (Medical): No    Lack of Transportation (Non-Medical): No  Physical Activity: Sufficiently Active  (08/26/2017)   Exercise Vital Sign    Days of Exercise per Week: 5 days    Minutes of Exercise per Session: 60 min  Stress: No Stress Concern Present (08/26/2017)   [employer] of Occupational Health - Occupational Stress Questionnaire    Feeling of Stress : Not at all  Social Connections: Moderately Isolated (08/26/2017)   Social Connection and Isolation Panel [NHANES]    Frequency of Communication with Friends and Family: Three times a week    Frequency of Social Gatherings with Friends and Family: More than three times a week    Attends Religious Services: Never    Database administrator or Organizations: No    Attends Banker Meetings: Never    Marital Status: Never married  Intimate Partner Violence: Not At Risk (08/26/2017)   Humiliation, Afraid, Rape, and Kick questionnaire    Fear of Current or Ex-Partner: No    Emotionally Abused: No    Physically Abused: No    Sexually Abused: No    ROS      Objective    There were no vitals taken for this visit.  Physical Exam     Assessment & Plan:   There are no diagnoses linked to this encounter.  No follow-ups on file.   Victoria Kitty, MD

## 2023-07-24 ENCOUNTER — Other Ambulatory Visit (HOSPITAL_COMMUNITY): Payer: Self-pay | Admitting: Psychiatry

## 2023-07-24 ENCOUNTER — Telehealth: Payer: Self-pay | Admitting: Child and Adolescent Psychiatry

## 2023-07-24 DIAGNOSIS — F3181 Bipolar II disorder: Secondary | ICD-10-CM

## 2023-07-24 DIAGNOSIS — F411 Generalized anxiety disorder: Secondary | ICD-10-CM

## 2023-07-24 MED ORDER — FLUVOXAMINE MALEATE 100 MG PO TABS
200.0000 mg | ORAL_TABLET | Freq: Every day | ORAL | 1 refills | Status: DC
Start: 2023-07-24 — End: 2023-11-27

## 2023-07-24 MED ORDER — LAMOTRIGINE 100 MG PO TABS
150.0000 mg | ORAL_TABLET | Freq: Every day | ORAL | 1 refills | Status: DC
Start: 2023-07-24 — End: 2023-11-27

## 2023-07-24 NOTE — Telephone Encounter (Signed)
Patient scheduled follow up appointment. States she needs a refill on Luvox 100 mg and Lamictal 100 mg.

## 2023-08-01 ENCOUNTER — Other Ambulatory Visit (HOSPITAL_COMMUNITY): Payer: Self-pay

## 2023-08-22 ENCOUNTER — Telehealth: Payer: MEDICAID | Admitting: Child and Adolescent Psychiatry

## 2023-08-22 DIAGNOSIS — F3181 Bipolar II disorder: Secondary | ICD-10-CM

## 2023-08-22 DIAGNOSIS — F411 Generalized anxiety disorder: Secondary | ICD-10-CM

## 2023-08-22 DIAGNOSIS — F5104 Psychophysiologic insomnia: Secondary | ICD-10-CM

## 2023-08-22 MED ORDER — QUETIAPINE FUMARATE 100 MG PO TABS
100.0000 mg | ORAL_TABLET | Freq: Every day | ORAL | 1 refills | Status: DC
Start: 2023-08-22 — End: 2024-02-17

## 2023-08-22 MED ORDER — TRAZODONE HCL 100 MG PO TABS: 100.0000 mg | ORAL_TABLET | Freq: Every day | ORAL | 1 refills | Status: DC

## 2023-08-22 NOTE — Progress Notes (Signed)
Virtual Visit via Video Note  I connected with Victoria Simmons on 08/22/23 at 10:00 AM EST by a video enabled telemedicine application and verified that I am speaking with the correct person using two identifiers.  Location: Patient: at work Provider: office   I discussed the limitations of evaluation and management by telemedicine and the availability of in person appointments. The patient expressed understanding and agreed to proceed.    I discussed the assessment and treatment plan with the patient. The patient was provided an opportunity to ask questions and all were answered. The patient agreed with the plan and demonstrated an understanding of the instructions.   The patient was advised to call back or seek an in-person evaluation if the symptoms worsen or if the condition fails to improve as anticipated.    Darcel Smalling, MD      [facility] MD/PA/NP OP Progress Note  08/22/2023 10:30 AM Victoria Simmons  MRN:  [id number]  Chief Complaint: "I am doing pretty good..."  Synopsis: Syvanna is a 27 year old Caucasian female, employed at [employer] and [employer] store in [place], with history of bipolar disorder type II, GAD, insomnia was seen by Dr. Daleen Bo since 2017 and transition to this writer in April 2020.  She is currently prescribed Lamictal 150 mg once a day, Abilify 10 mg once a day, trazodone 150 mg once a day and Luvox 150 mg once a day.   HPI:   Jadesola was seen and evaluated over telemedicine encounter for medication management follow-up.  Her last appointment was about 5 months ago, and at the time she was continued on her current medications as mentioned below in the plan.  Today she reported that she has been doing "pretty good", denied any new concerns for today's appointment.  She reported that she has been working full-time at a local daycare in [place] and that has been going very well.  She reported that she likes her coworkers and despite having some anxiety around kids, she is  doing well at work.  Overall she denied excessive worries or anxiety.  She reported that she has been doing very good around her family, and a new relationship with her girlfriend for the last 6 months and that has been going very well as well.  She reported that she has been sleeping well, has decent energy throughout the day, denied any SI or HI, denied any nonsuicidal self-harm behaviors or thoughts.  She reported that she has been taking medications as prescribed and denied any side effects associated with it.  In her free time she has been spending time going out with her cousins, hanging out with her girlfriend on the weekends and enjoys these activities.  She denied any substance abuse, reported that she stopped drinking alcohol as well as using marijuana and vaping.  We discussed to continue with current medications because of the stability of her symptoms and follow-up again in about 3 months or earlier if needed.  She verbalized understanding and agreed with this plan.  Visit Diagnosis:    ICD-10-CM   1. Bipolar 2 disorder, major depressive episode (HCC)  F31.81 QUEtiapine (SEROQUEL) 100 MG tablet         Past Psychiatric History: PPHx reviewed today and as followin. 2 previous inpatient admission at [facility] at the age of 22, and last at [facility], no previous suicide attempts, has history of cutting and her past medication trials include Prozac and Zoloft.  Decreasing Luvox to 50 mg worsened her symptoms of  anxiety therefore it was increased back to 100 mg once a day.   Previous trials of Zoloft 150 mg and Prozac 10 mg stopped due to inefficacy.   Past Medical History:  Past Medical History:  Diagnosis Date   Anxiety    Bipolar 2 disorder (HCC)    Depression    Dysmenorrhea    Hypertriglyceridemia 2020   MDD (major depressive disorder) 06/19/2017   Ovarian torsion 2008    Past Surgical History:  Procedure Laterality Date   COLONOSCOPY  08/2020   polyp, int hem, benign biopsies  ([facility])   ESOPHAGOGASTRODUODENOSCOPY  08/2020   LA grade A reflux esophagitis and antral gastritis, H pylori neg, treated with PPI BID and carafate course ([facility]   OVARY SURGERY  06/30/2007   dx laparoscopy, reduction of right adnexal torsion, bilateral oophoropexy of each ovary to posterior uterine fundus    Family Psychiatric History: As mentioned in initial H&P, reviewed today, no change Family History:  Family History  Problem Relation Age of Onset   Hepatitis B Mother    Anxiety disorder Mother    Hypertension Father    ADD / ADHD Brother    Ovarian cancer Paternal Grandmother     Social History:  Social History   Socioeconomic History   Marital status: Legally Separated    Spouse name: Not on file   Number of children: 0   Years of education: Not on file   Highest education level: Some college, no degree  Occupational History   Occupation: [employer]    Comment: full time  Tobacco Use   Smoking status: Never   Smokeless tobacco: Never  Vaping Use   Vaping status: Former  Substance and Sexual Activity   Alcohol use: Yes    Comment: twice a month (5-7 drinks)   Drug use: Yes    Types: Marijuana    Comment: last use 16 Aug 2020   Sexual activity: Yes  Other Topics Concern   Not on file  Social History Narrative          Social Determinants of Health   Financial Resource Strain: Low Risk  (08/26/2017)   Overall Financial Resource Strain (CARDIA)    Difficulty of Paying Living Expenses: Not hard at all  Food Insecurity: No Food Insecurity (08/26/2017)   Hunger Vital Sign    Worried About Running Out of Food in the Last Year: Never true    Ran Out of Food in the Last Year: Never true  Transportation Needs: No Transportation Needs (08/26/2017)   PRAPARE - Administrator, Civil Service (Medical): No    Lack of Transportation (Non-Medical): No  Physical Activity: Sufficiently Active (08/26/2017)   Exercise Vital Sign    Days of  Exercise per Week: 5 days    Minutes of Exercise per Session: 60 min  Stress: No Stress Concern Present (08/26/2017)   [employer] of Occupational Health - Occupational Stress Questionnaire    Feeling of Stress : Not at all  Social Connections: Moderately Isolated (08/26/2017)   Social Connection and Isolation Panel [NHANES]    Frequency of Communication with Friends and Family: Three times a week    Frequency of Social Gatherings with Friends and Family: More than three times a week    Attends Religious Services: Never    Database administrator or Organizations: No    Attends Banker Meetings: Never    Marital Status: Never married    Allergies: No Known  Allergies  Metabolic Disorder Labs: Lab Results  Component Value Date   HGBA1C 5.0 07/26/2020   MPG 97 07/26/2020   MPG 94 05/14/2017   No results found for: "PROLACTIN" Lab Results  Component Value Date   CHOL 206 (H) 06/18/2023   TRIG 193.0 (H) 06/18/2023   HDL 42.20 06/18/2023   CHOLHDL 5 06/18/2023   VLDL 38.6 06/18/2023   LDLCALC 125 (H) 06/18/2023   LDLCALC 154 (H) 07/26/2020   Lab Results  Component Value Date   TSH 4.67 06/18/2023   TSH 2.70 09/12/2021    Therapeutic Level Labs: No results found for: "LITHIUM" No results found for: "VALPROATE" No results found for: "CBMZ"  Current Medications: Current Outpatient Medications  Medication Sig Dispense Refill   famotidine (PEPCID) 20 MG tablet Take 1 tablet (20 mg total) by mouth at bedtime.     fluvoxaMINE (LUVOX) 100 MG tablet Take 2 tablets (200 mg total) by mouth at bedtime. 180 tablet 1   lamoTRIgine (LAMICTAL) 100 MG tablet Take 1&1/2 tablets (150 mg total) by mouth at bedtime. 135 tablet 1   Levonorgestrel (KYLEENA IU) by Intrauterine route.     ondansetron (ZOFRAN-ODT) 4 MG disintegrating tablet Take 1 tablet (4 mg total) by mouth every 8 (eight) hours as needed for nausea or vomiting. 20 tablet 0   QUEtiapine (SEROQUEL) 100 MG  tablet Take 1 tablet (100 mg total) by mouth at bedtime. 90 tablet 1   traZODone (DESYREL) 100 MG tablet Take 1 tablet (100 mg total) by mouth at bedtime. 90 tablet 1   triamcinolone (KENALOG) 0.1 % paste Use as directed 1 Application in the mouth or throat 2 (two) times daily. 5 g 0   No current facility-administered medications for this visit.     Musculoskeletal: Strength & Muscle Tone: unable to assess since visit was over the telemedicine.  Gait & Station: unable to assess since visit was over the telemedicine.  Patient leans: N/A  Psychiatric Specialty Exam: ROSReview of 12 systems negative except as mentioned in HPI   There were no vitals taken for this visit.There is no height or weight on file to calculate BMI.  General Appearance: Casual and Well Groomed  Eye Contact:  Good  Speech:  Clear and Coherent and Normal Rate  Volume:  Normal  Mood: "pretty good..."  Affect:  Appropriate, Congruent, and Full Range  Thought Process:  Goal Directed and Linear  Orientation:  Full (Time, Place, and Person)  Thought Content: Logical   Suicidal Thoughts:  No  Homicidal Thoughts:  No  Memory:  Immediate;   Fair Recent;   Fair Remote;   Fair  Judgement:  Good  Insight:  Good  Psychomotor Activity:  Normal  Concentration:  Concentration: Good and Attention Span: Good  Recall:  Good  Fund of Knowledge: Good  Language: Good  Akathisia:  No    AIMS (if indicated): not done  Assets:  Communication Skills Desire for Improvement Financial Resources/Insurance Housing Leisure Time Physical Health Social Support Transportation Vocational/Educational  ADL's:  Intact  Cognition: WNL  Sleep:  Good     Screenings: AIMS    Flowsheet Row Admission (Discharged) from 05/13/2017 in [facility]PATIENT BE[facility]AV[facility]AL MEDICINE  AIMS Total Score 0      AUDIT    Flowsheet Row [facility] Admission (Discharged) from  05/13/2017 in [facility] INPATIENT BEHAV[facility]AL MEDICINE  Alcohol Use Disorder Identification Test Final Score (AUDIT) 6 0  GAD-7    Flowsheet Row Office Visit from 06/25/2023 in [facility] [place] Counselor from 01/02/2023 in [facility] Counselor from 07/30/2022 in [facility] from 10/04/2021 in [facility] Office Visit from 04[facility]05/2020 in [facility]  Total GAD-7 Score 2 15 11 15 7       PHQ2-9    Flowsheet Row Office Visit from 06/25/2023 in [facility] [place] Counselor from 02/01/2023 in [facility] Counselor from 01/08/2023 in [facility] Counselor from 01/02/2023 in [facility] Counselor from 07/30/2022 in [facility] at [facility] Total Score 0 0 6 5 2   PHQ-9 Total Score 3 2 23 23 5       Flowsheet Row Counselor from 03/25/2023 in [facility] at [facility] from 02/08/2023 in [facility] at [place] Counselor from 02/01/2023 in [facility]  C-SSRS RISK CATEGORY Error: Q3, 4, or 5 should not be populated when Q2 is No Error: Q3, 4, or 5 should not be populated when Q2 is No Error: Q3, 4, or 5 should not be populated when Q2 is No        Assessment and Plan:   Delilia is a 27 yr old Caucasian female who is single, employed, has a history of bipolar disorder, generalized anxiety disorder, insomnia evaluated today for a follow-up visit for medication management.  Reviewed response to her current medications and she appears to have continued stability with her mood and anxiety, she did her blood work about 2 months ago, her total cholesterol was 206 and her LDL was 125, which  seems to be better as compared to her blood work 3 years ago however her triglyceride was elevated, and was noted at 196.  Rest of the labs appear stable.  Discussed lifestyle interventions including exercise as well as healthy eating.  She was receptive to this.     Plan as below.  Reviewed on 08/22/23   Plan  Bipolar 2 disorder (chronic, stable) Continue with lamotrigine 150 mg daily.  Continue Luvox  200 mg p.o. daily Continue Seroquel 100 mg daily at bedtime    For generalized anxiety disorder-(chronic and stable) -Was previously seeing therapist at [facility] however has not seen since she has left the practice.  Referred her to a new therapist at the clinic. Luvox as mentioned above.  Medications above will be helpful in decreasing anxiety. Previously tried Prozac upto 10 mg daily and Zoloft 150 mg daily - stopped due to inefficacy.    For insomnia-chronic and stable Continue trazodone 150mg  p.o. nightly Can take Atarax 25-50 mg QHS PRN for sleep.      This note was generated in part or whole with voice recognition software. Voice recognition is usually quite accurate but there are transcription errors that can and very often do occur. I apologize for any typographical errors that were not detected and corrected.    MDM = 2 or more chronic stable conditions + med management  Darcel Smalling, MD 08/22/2023, 10:37 AM

## 2023-09-13 ENCOUNTER — Encounter: Payer: Self-pay | Admitting: Family Medicine

## 2023-09-13 ENCOUNTER — Ambulatory Visit (INDEPENDENT_AMBULATORY_CARE_PROVIDER_SITE_OTHER): Payer: MEDICAID | Admitting: Family Medicine

## 2023-09-13 VITALS — BP 116/70 | HR 82 | Temp 98.1°F | Ht 65.75 in | Wt 192.5 lb

## 2023-09-13 DIAGNOSIS — L989 Disorder of the skin and subcutaneous tissue, unspecified: Secondary | ICD-10-CM | POA: Insufficient documentation

## 2023-09-13 MED ORDER — DOXYCYCLINE HYCLATE 100 MG PO TABS: 100.0000 mg | ORAL_TABLET | Freq: Two times a day (BID) | ORAL | 0 refills | Status: DC

## 2023-09-13 NOTE — Assessment & Plan Note (Addendum)
Possible spider bites given noted exposure.  Some of the erosions are scabbed with surrounding erythema.  Rx doxycycline 1 wk course to cover bacterial superinfection.  Update if not improving with treatment.  No h/o MRSA

## 2023-09-13 NOTE — Progress Notes (Signed)
Ph: [phone] Fax: [phone]   Patient ID: Victoria Simmons, female    DOB: 03/21/1996, 27 y.o.   MRN: [id number]  This visit was conducted in person.  BP 116/70   Pulse 82   Temp 98.1 F (36.7 C) (Oral)   Ht 5' 5.75" (1.67 m)   Wt 192 lb 8 oz (87.3 kg)   LMP 08/27/2023   SpO2 97%   BMI 31.31 kg/m    CC: check insect bites  Subjective:   HPI: Victoria Simmons is a 27 y.o. female presenting on 09/13/2023 for Insect Bite (C/o several bug bites. Areas are red and black. Noticed 09/07/23. )   6-7 d h/o ?bug bites to legs as well as one spot on right upper arm.  Spots are both itchy and painful.  Treating with benadryl cream.   Mild abd discomfort No fevers/chills, nausea, new joint pains.  No similar rash in family members.   No new medicines.  She continues lamictal 150mg  bid - chronic medicine.   No stays in new beds.  No h/o MRSA.   She has noted spider cobwebs around her bed      Relevant past medical, surgical, family and social history reviewed and updated as indicated. Interim medical history since our last visit reviewed. Allergies and medications reviewed and updated. Outpatient Medications Prior to Visit  Medication Sig Dispense Refill   fluvoxaMINE (LUVOX) 100 MG tablet Take 2 tablets (200 mg total) by mouth at bedtime. 180 tablet 1   lamoTRIgine (LAMICTAL) 100 MG tablet Take 1&1/2 tablets (150 mg total) by mouth at bedtime. 135 tablet 1   ondansetron (ZOFRAN-ODT) 4 MG disintegrating tablet Take 1 tablet (4 mg total) by mouth every 8 (eight) hours as needed for nausea or vomiting. 20 tablet 0   QUEtiapine (SEROQUEL) 100 MG tablet Take 1 tablet (100 mg total) by mouth at bedtime. 90 tablet 1   traZODone (DESYREL) 100 MG tablet Take 1 tablet (100 mg total) by mouth at bedtime. 90 tablet 1   triamcinolone (KENALOG) 0.1 % paste Use as directed 1 Application in the mouth or throat 2 (two) times daily. 5 g 0   famotidine (PEPCID) 20 MG tablet Take 1 tablet  (20 mg total) by mouth at bedtime.     Levonorgestrel (KYLEENA IU) by Intrauterine route.     No facility-administered medications prior to visit.     Per HPI unless specifically indicated in ROS section below Review of Systems  Objective:  BP 116/70   Pulse 82   Temp 98.1 F (36.7 C) (Oral)   Ht 5' 5.75" (1.67 m)   Wt 192 lb 8 oz (87.3 kg)   LMP 08/27/2023   SpO2 97%   BMI 31.31 kg/m   Wt Readings from Last 3 Encounters:  09/13/23 192 lb 8 oz (87.3 kg)  06/25/23 191 lb (86.6 kg)  05/14/23 200 lb (90.7 kg)      Physical Exam Vitals and nursing note reviewed.  Constitutional:      Appearance: Normal appearance. She is not ill-appearing.  Skin:    General: Skin is warm and dry.     Findings: Erythema and rash present.     Comments: Several erosions of different ages at R knee (oldest), L medial ankle (with surrounding erythema), R upper arm and R upper back  Neurological:     Mental Status: She is alert.        Assessment & Plan:   Problem List Items Addressed  This Visit     Skin lesions - Primary    Possible spider bites given noted exposure.  Some of the erosions are scabbed with surrounding erythema.  Rx doxycycline 1 wk course to cover bacterial superinfection.  Update if not improving with treatment.  No h/o MRSA        Meds ordered this encounter  Medications   doxycycline (VIBRA-TABS) 100 MG tablet    Sig: Take 1 tablet (100 mg total) by mouth 2 (two) times daily.    Dispense:  14 tablet    Refill:  0    No orders of the defined types were placed in this encounter.   Patient Instructions  Not sure where rash is coming from, possibly insect bite.  Take doxycycline 100mg  twice daily for 7 days - this will cover any overlying bacterial infection.  Take with food, caution with increased risk of sunburns while on the medicines.   Follow up plan: No follow-ups on file.  Eustaquio Boyden, MD

## 2023-09-13 NOTE — Patient Instructions (Addendum)
Not sure where rash is coming from, possibly insect bite.  Take doxycycline 100mg  twice daily for 7 days - this will cover any overlying bacterial infection.  Take with food, caution with increased risk of sunburns while on the medicines.

## 2023-09-17 ENCOUNTER — Ambulatory Visit (INDEPENDENT_AMBULATORY_CARE_PROVIDER_SITE_OTHER): Payer: MEDICAID | Admitting: Professional Counselor

## 2023-09-17 DIAGNOSIS — F411 Generalized anxiety disorder: Secondary | ICD-10-CM | POA: Diagnosis not present

## 2023-09-17 DIAGNOSIS — F3181 Bipolar II disorder: Secondary | ICD-10-CM | POA: Diagnosis not present

## 2023-09-17 DIAGNOSIS — F1220 Cannabis dependence, uncomplicated: Secondary | ICD-10-CM | POA: Diagnosis not present
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
## 2023-10-04 ENCOUNTER — Ambulatory Visit (INDEPENDENT_AMBULATORY_CARE_PROVIDER_SITE_OTHER): Payer: MEDICAID | Admitting: Professional Counselor

## 2023-10-04 DIAGNOSIS — F3181 Bipolar II disorder: Secondary | ICD-10-CM

## 2023-10-04 DIAGNOSIS — F411 Generalized anxiety disorder: Secondary | ICD-10-CM | POA: Diagnosis not present
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
## 2023-10-22 ENCOUNTER — Ambulatory Visit: Payer: MEDICAID

## 2023-10-22 ENCOUNTER — Encounter: Payer: Self-pay | Admitting: Emergency Medicine

## 2023-10-22 ENCOUNTER — Ambulatory Visit
Admission: EM | Admit: 2023-10-22 | Discharge: 2023-10-22 | Disposition: A | Discharge status: Home / Self Care | Payer: MEDICAID

## 2023-10-22 DIAGNOSIS — J069 Acute upper respiratory infection, unspecified: Secondary | ICD-10-CM

## 2023-10-22 DIAGNOSIS — J22 Unspecified acute lower respiratory infection: Secondary | ICD-10-CM

## 2023-10-22 MED ORDER — AMOXICILLIN-POT CLAVULANATE 875-125 MG PO TABS: 1.0000 | ORAL_TABLET | Freq: Two times a day (BID) | ORAL | 0 refills | Status: AC

## 2023-10-22 NOTE — ED Triage Notes (Signed)
 Symptoms onset Thurs. Feels pressure and dizziness in head. Pressure behind eyes. Ears clogged. Body aches. SOB as result. Sore throat that started yesterday.

## 2023-10-22 NOTE — ED Provider Notes (Signed)
 [facility]    CSN: [id number] Arrival date & time: 10/22/23  0801      History   Chief Complaint Chief Complaint  Patient presents with   Shortness of Breath   Dizziness    HPI Victoria Simmons is a 28 y.o. female.   28 year old female, Victoria Simmons, presents to urgent care for evaluation of worsening sinus pressure and dizziness x 5 days, body aches,SOB, sore throat since yesterday.  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  Using over-the-counter meds without relief  The history is provided by the patient. No language interpreter was used.    Past Medical History:  Diagnosis Date   Anxiety    Bipolar 2 disorder (HCC)    Depression    Dysmenorrhea    Hypertriglyceridemia 2020   MDD (major depressive disorder) 06/19/2017   Ovarian torsion 2008    Patient Active Problem List   Diagnosis Date Noted   Acute upper respiratory infection 10/22/2023   Skin lesions 09/13/2023   Sore of lower lip 06/25/2023   Nausea & vomiting 06/25/2023   Lactose intolerance 06/25/2023   Encounter for long-term current use of medication 06/16/2023   Cannabis dependence (HCC) 01/14/2023   Acne 03/14/2022   Infected epidermoid cyst 11/03/2021   Reflux esophagitis 11/03/2021   Encounter for general adult medical examination with abnormal findings 06/19/2017   HLD (hyperlipidemia) 06/19/2017   Bipolar 2 disorder, major depressive episode (HCC) 05/13/2017   GAD (generalized anxiety disorder) 05/13/2017   Menorrhagia 01/25/2014   Murmur 01/06/2013    Past Surgical History:  Procedure Laterality Date   COLONOSCOPY  08/2020   polyp, int hem, benign biopsies ([facility])   ESOPHAGOGASTRODUODENOSCOPY  08/2020   LA grade A reflux esophagitis and antral gastritis, H pylori neg, treated with PPI BID and carafate  course ([facility]   OVARY SURGERY  06/30/2007   dx laparoscopy, reduction of right adnexal torsion, bilateral oophoropexy of each ovary to  posterior uterine fundus    OB History     Gravida  0   Para  0   Term  0   Preterm  0   AB  0   Living  0      SAB  0   IAB  0   Ectopic  0   Multiple  0   Live Births  0            Home Medications    Prior to Admission medications   Medication Sig Start Date End Date Taking? Authorizing Provider  amoxicillin -clavulanate (AUGMENTIN ) 875-125 MG tablet Take 1 tablet by mouth every 12 (twelve) hours for 5 days. 10/22/23 10/27/23 Yes Lynanne Delgreco, NP  fluvoxaMINE  (LUVOX ) 100 MG tablet Take 2 tablets (200 mg total) by mouth at bedtime. 07/24/23  Yes Okey Barnie SAUNDERS, MD  hydrOXYzine  (ATARAX ) 10 MG tablet Take 10 mg by mouth 3 (three) times daily as needed.   Yes [provider]  lamoTRIgine  (LAMICTAL ) 100 MG tablet Take 1&1/2 tablets (150 mg total) by mouth at bedtime. 07/24/23  Yes Okey Barnie SAUNDERS, MD  ondansetron  (ZOFRAN -ODT) 4 MG disintegrating tablet Take 1 tablet (4 mg total) by mouth every 8 (eight) hours as needed for nausea or vomiting. 06/25/23  Yes Rilla Baller, MD  QUEtiapine  (SEROQUEL ) 100 MG tablet Take 1 tablet (100 mg total) by mouth at bedtime. 08/22/23  Yes Umrania, Hiren M, MD  traZODone  (DESYREL ) 100 MG tablet Take 1 tablet (100 mg total) by  mouth at bedtime. 08/22/23  Yes Susen [name] HERO, MD  triamcinolone  (KENALOG ) 0.1 % paste Use as directed 1 Application in the mouth or throat 2 (two) times daily. 06/25/23   Rilla Baller, MD  dicyclomine  (BENTYL ) 10 MG capsule Take 1 capsule (10 mg total) by mouth 4 (four) times daily as needed for spasms. Patient not taking: Reported on 07/26/2020 07/08/20 07/26/20  Cara Elida HERO, NP    Family History Family History  Problem Relation Age of Onset   Hepatitis B Mother    Anxiety disorder Mother    Hypertension Father    ADD / ADHD Brother    Ovarian cancer Paternal Grandmother     Social History Social History   Tobacco Use   Smoking status: Never    Passive exposure:  Past   Smokeless tobacco: Never  Vaping Use   Vaping status: Former  Substance Use Topics   Alcohol use: Yes    Comment: Occasionally   Drug use: Not Currently    Types: Marijuana    Comment: last use 16 Aug 2020     Allergies   Patient has no known allergies.   Review of Systems Review of Systems  Constitutional:  Negative for fever.  HENT:  Positive for congestion, sinus pressure, sinus pain and sore throat.   Respiratory:  Positive for shortness of breath.   Musculoskeletal:  Positive for myalgias.  Neurological:  Positive for dizziness.  All other systems reviewed and are negative.    Physical Exam Triage Vital Signs ED Triage Vitals [10/22/23 0812]  Encounter Vitals Group     BP      Systolic BP Percentile      Diastolic BP Percentile      Pulse      Resp      Temp      Temp src      SpO2      Weight      Height      Head Circumference      Peak Flow      Pain Score 6     Pain Loc      Pain Education      Exclude from Growth Chart    No data found.  Updated Vital Signs BP 103/71 (BP Location: Left Arm)   Pulse 83   Temp (!) 97.5 F (36.4 C) (Oral)   Resp 20   Ht 5' 5.75 (1.67 m)   Wt 192 lb 7.4 oz (87.3 kg)   LMP 10/21/2023 (Exact Date)   SpO2 94%   BMI 31.30 kg/m   Visual Acuity Right Eye Distance:   Left Eye Distance:   Bilateral Distance:    Right Eye Near:   Left Eye Near:    Bilateral Near:     Physical Exam Vitals and nursing note reviewed.  Constitutional:      General: She is not in acute distress.    Appearance: She is well-developed.  HENT:     Head: Normocephalic.     Right Ear: Tympanic membrane is retracted.     Left Ear: Tympanic membrane is retracted.     Nose: Mucosal edema and congestion present.     Right Sinus: Maxillary sinus tenderness present.     Left Sinus: Maxillary sinus tenderness present.     Mouth/Throat:     Lips: Pink.     Mouth: Mucous membranes are moist.     Pharynx: Uvula midline. Posterior  oropharyngeal erythema present.  Eyes:  General: Lids are normal.     Conjunctiva/sclera: Conjunctivae normal.     Pupils: Pupils are equal, round, and reactive to light.  Neck:     Trachea: No tracheal deviation.  Cardiovascular:     Rate and Rhythm: Normal rate and regular rhythm.     Pulses: Normal pulses.     Heart sounds: Normal heart sounds. No murmur heard. Pulmonary:     Effort: Pulmonary effort is normal.     Breath sounds: Normal breath sounds and air entry.  Abdominal:     General: Bowel sounds are normal.     Palpations: Abdomen is soft.     Tenderness: There is no abdominal tenderness.  Musculoskeletal:        General: Normal range of motion.     Cervical back: Normal range of motion.  Lymphadenopathy:     Cervical: No cervical adenopathy.  Skin:    General: Skin is warm and dry.     Findings: No rash.  Neurological:     General: No focal deficit present.     Mental Status: She is alert and oriented to person, place, and time.     GCS: GCS eye subscore is 4. GCS verbal subscore is 5. GCS motor subscore is 6.  Psychiatric:        Speech: Speech normal.        Behavior: Behavior normal. Behavior is cooperative.      UC Treatments / Results  Labs (all labs ordered are listed, but only abnormal results are displayed) Labs Reviewed - No data to display  EKG   Radiology No results found.  Procedures Procedures (including critical care time)  Medications Ordered in UC Medications - No data to display  Initial Impression / Assessment and Plan / UC Course  I have reviewed the triage vital signs and the nursing notes.  Pertinent labs & imaging results that were available during my care of the patient were reviewed by me and considered in my medical decision making (see chart for details).    Discussed exam findings and plan of care with patient, treating wit Augmentin  x 5 days, strict go to ER precautions given.   Patient verbalized understanding to  this provider.  Ddx: Acute uri, allergies Final Clinical Impressions(s) / UC Diagnoses   Final diagnoses:  Acute upper respiratory infection     Discharge Instructions      Take antibiotic as directed. Rest,push fluids. May use OTC meds of choice(Mucinex, tylenol ,etc) as label directed. Follow up with PCP in 3 days if no improvement,sooner if worse.     ED Prescriptions     Medication Sig Dispense Auth. Provider   amoxicillin -clavulanate (AUGMENTIN ) 875-125 MG tablet Take 1 tablet by mouth every 12 (twelve) hours for 5 days. 10 tablet Anslie Spadafora, Rilla, NP      PDMP not reviewed this encounter.   Aminta Rilla, NP 10/22/23 [phone]

## 2023-10-22 NOTE — Discharge Instructions (Addendum)
 Take antibiotic as directed. Rest,push fluids. May use OTC meds of choice(Mucinex, tylenol,etc) as label directed. Follow up with PCP in 3 days if no improvement,sooner if worse.

## 2023-10-28 ENCOUNTER — Ambulatory Visit (INDEPENDENT_AMBULATORY_CARE_PROVIDER_SITE_OTHER): Payer: MEDICAID | Admitting: Professional Counselor

## 2023-10-28 DIAGNOSIS — Z91199 Patient's noncompliance with other medical treatment and regimen due to unspecified reason: Secondary | ICD-10-CM

## 2023-10-28 NOTE — Progress Notes (Signed)
Patient no-showed today's appointment; appointment was for 10/28/23 at 8 AM for follow-up therapy session.

## 2023-11-05 ENCOUNTER — Encounter: Payer: Self-pay | Admitting: Family Medicine

## 2023-11-05 ENCOUNTER — Telehealth (INDEPENDENT_AMBULATORY_CARE_PROVIDER_SITE_OTHER): Payer: MEDICAID | Admitting: Family Medicine

## 2023-11-05 ENCOUNTER — Ambulatory Visit: Payer: MEDICAID

## 2023-11-05 VITALS — Ht 65.75 in | Wt 191.0 lb

## 2023-11-05 DIAGNOSIS — U071 COVID-19: Secondary | ICD-10-CM | POA: Diagnosis not present

## 2023-11-05 DIAGNOSIS — J22 Unspecified acute lower respiratory infection: Secondary | ICD-10-CM | POA: Diagnosis not present

## 2023-11-05 LAB — POCT FLU A/B STATUS
Influenza A, POC: NEGATIVE
Influenza B, POC: NEGATIVE

## 2023-11-05 LAB — POC COVID19 BINAXNOW: SARS Coronavirus 2 Ag: POSITIVE — AB

## 2023-11-05 NOTE — Progress Notes (Signed)
Drive up testing done and results are in the original OV

## 2023-11-05 NOTE — Assessment & Plan Note (Signed)
Suspect flu like illness - influenza vs COVID.  I have asked her to stop by at 2:30pm for COVID and influenza swabs.  If negative, would treat as sinusitis given recurrent symptoms after initial URI treatment earlier this month at [facility].  Supportive measures reviewed - fluids, rest, tylenol, OTC remedies.  Out of work letter written today.

## 2023-11-05 NOTE — Progress Notes (Signed)
Ph: [phone] Fax: [phone]   Patient ID: Victoria Simmons, female    DOB: 09/10/1996, 28 y.o.   MRN: [id number]  Virtual visit completed through MyChart, a video enabled telemedicine application. Due to national recommendations of social distancing due to COVID-19, a virtual visit is felt to be most appropriate for this patient at this time. Reviewed limitations, risks, security and privacy concerns of performing a virtual visit and the availability of in person appointments. I also reviewed that there may be a patient responsible charge related to this service. The patient agreed to proceed.   Patient location: home Provider location:  at [facility], office Persons participating in this virtual visit: patient, provider   If any vitals were documented, they were collected by patient at home unless specified below.    Ht 5' 5.75" (1.67 m)   Wt 191 lb (86.6 kg)   LMP 10/21/2023 (Exact Date)   BMI 31.06 kg/m    CC: cough, headache Subjective:   HPI: Victoria Simmons is a 28 y.o. female presenting on 11/05/2023 for Cough (C/o cough, HA, body aches, chills and fatigue. Sxs started 1/25/2. No home Covid test, but can have someone drop it off to take later today. )   Seen at [facility] 10/22/2023 with 5d of URI symptoms including sinus pressure, dizziness, body aches, dyspnea, ST. Diagnosed with URI treated with 5d augmentin course. Was not swabbed. Symptoms fully resolved at that time.   Now over the past 4 days has developed recurrent head and sinus pressure, congestion, L earache, intermittent cough, headache with cough, body aches, and weakness/fatigue. Mild ST. Feverish/chills - but doesn't have thermometer at home. Fell at home this morning due to weakness, fortunately no head or other injury. No tooth pain, abd pain, nausea/diarrhea, dyspnea or wheezing.  Sudden onset of symptoms on Saturday.  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   Has remained out of work today.       Relevant past medical, surgical, family and social history reviewed and updated as indicated. Interim medical history since our last visit reviewed. Allergies and medications reviewed and updated. Outpatient Medications Prior to Visit  Medication Sig Dispense Refill   fluvoxaMINE (LUVOX) 100 MG tablet Take 2 tablets (200 mg total) by mouth at bedtime. 180 tablet 1   hydrOXYzine (ATARAX) 10 MG tablet Take 10 mg by mouth 3 (three) times daily as needed.     lamoTRIgine (LAMICTAL) 100 MG tablet Take 1&1/2 tablets (150 mg total) by mouth at bedtime. 135 tablet 1   ondansetron (ZOFRAN-ODT) 4 MG disintegrating tablet Take 1 tablet (4 mg total) by mouth every 8 (eight) hours as needed for nausea or vomiting. 20 tablet 0   QUEtiapine (SEROQUEL) 100 MG tablet Take 1 tablet (100 mg total) by mouth at bedtime. 90 tablet 1   traZODone (DESYREL) 100 MG tablet Take 1 tablet (100 mg total) by mouth at bedtime. 90 tablet 1   triamcinolone (KENALOG) 0.1 % paste Use as directed 1 Application in the mouth or throat 2 (two) times daily. 5 g 0   No facility-administered medications prior to visit.     Per HPI unless specifically indicated in ROS section below Review of Systems Objective:  Ht 5' 5.75" (1.67 m)   Wt 191 lb (86.6 kg)   LMP 10/21/2023 (Exact Date)   BMI 31.06 kg/m   Wt Readings from Last 3 Encounters:  11/05/23 191 lb (86.6 kg)  10/22/23 192 lb 7.4 oz (87.3  kg)  09/13/23 192 lb 8 oz (87.3 kg)       Physical exam: Gen: alert, NAD, tired appearing, congested Pulm: speaks in complete sentences without increased work of breathing Psych: normal mood, normal thought content      Results for orders placed or performed in visit on 11/05/23  POC COVID-19   Collection Time: 11/05/23  2:39 PM  Result Value Ref Range   SARS Coronavirus 2 Ag Positive (A) Negative  POCT Flu A & B Status   Collection Time: 11/05/23  2:50 PM  Result Value Ref Range   Influenza A, POC Negative Negative    Influenza B, POC Negative Negative   Lab Results  Component Value Date   NA 140 06/18/2023   CL 107 06/18/2023   K 3.8 06/18/2023   CO2 23 06/18/2023   BUN 16 06/18/2023   CREATININE 1.06 06/18/2023   GFR 72.34 06/18/2023   CALCIUM 9.3 06/18/2023   ALBUMIN 4.2 06/18/2023   GLUCOSE 88 06/18/2023    Assessment & Plan:   COVID-19 virus infection Assessment & Plan: Reviewed currently approved antiviral treatments.  Reviewed expected course of illness, anticipated course of recovery, as well as red flags to suggest COVID pneumonia and/or to seek urgent in-person care. Reviewed CDC isolation/quarantine guidelines.  Encouraged fluids and rest. Reviewed further supportive care measures at home including vit C 500mg  bid, vit D 2000 IU daily, zinc 100mg  daily, tylenol PRN, pepcid 20mg  BID PRN.   Recommend:  Full dose paxlovid (latest GFR 72) Paxlovid drug interactions:  Lamotrigine - decreased effect  Quetiapine - increased effect - cut in half to a quarter tablets while on paxlovid Trazodone - increased effect - cut in half  Given multiple drug interactions - she will hold off on paxlovid.  She will let us know if starts feeling worse tomorrow to call in paxlovid Reviewed red flags to seek urgent care.   [email or web address]    Acute respiratory infection Assessment & Plan: Suspect flu like illness - influenza vs COVID.  I have asked her to stop by at 2:30pm for COVID and influenza swabs.  If negative, would treat as sinusitis given recurrent symptoms after initial URI treatment earlier this month at [facility].  Supportive measures reviewed - fluids, rest, tylenol, OTC remedies.  Out of work letter written today.   Orders: -     POC COVID-19 BinaxNow; Future -     POCT Flu A & B Status; Future     I discussed the assessment and treatment plan with the patient. The patient was provided an opportunity to ask questions and all were answered. The patient agreed with the plan and  demonstrated an understanding of the instructions. The patient was advised to call back or seek an in-person evaluation if the symptoms worsen or if the condition fails to improve as anticipated.  Follow up plan: No follow-ups on file.  Eustaquio Boyden, MD

## 2023-11-05 NOTE — Assessment & Plan Note (Addendum)
Reviewed currently approved antiviral treatments.  Reviewed expected course of illness, anticipated course of recovery, as well as red flags to suggest COVID pneumonia and/or to seek urgent in-person care. Reviewed CDC isolation/quarantine guidelines.  Encouraged fluids and rest. Reviewed further supportive care measures at home including vit C 500mg  bid, vit D 2000 IU daily, zinc 100mg  daily, tylenol PRN, pepcid 20mg  BID PRN.   Recommend:  Full dose paxlovid (latest GFR 72) Paxlovid drug interactions:  Lamotrigine - decreased effect  Quetiapine - increased effect - cut in half to a quarter tablets while on paxlovid Trazodone - increased effect - cut in half  Given multiple drug interactions - she will hold off on paxlovid.  She will let us know if starts feeling worse tomorrow to call in paxlovid Reviewed red flags to seek urgent care.   [email or web address]

## 2023-11-11 ENCOUNTER — Ambulatory Visit: Payer: MEDICAID | Admitting: Professional Counselor

## 2023-11-11 DIAGNOSIS — F3181 Bipolar II disorder: Secondary | ICD-10-CM

## 2023-11-11 NOTE — Progress Notes (Unsigned)
  THERAPIST PROGRESS NOTE  Session Time: 8:10 AM - 8:35 AM   Participation Level: Active  Behavioral Response: Casual, Alert, Euthymic  Type of Therapy: Individual Therapy  Treatment Goals addressed: BH Bipolar disorder LTG: "I want to be ready to be able to completely believe in myself that I can get off my medicines and still stay as well balance as I am on them." Reianna will effectively identify triggers, manage symptoms, and reduce episodes over the next year.    Dates: Start:  10/04/23    Expected End:  10/01/25     STG: "I will be having a great day and then I will get home and just be so sad that I'm alone." Lowana will identify cognitive patterns and beliefs that trigger symptoms and learn to restructure unhealthy beliefs.   STG - Other Anxiety Goal (Specify)  "I want to work on driving. I don't know what happened, but when I get in the drivers seat I get so anxious. I don't have my license. Last time I drove I like blacked out." Ambrea will identify distorted thinking patterns that contribute to anxiety symptoms and create an exposure ladder to practice behavioral engagement over the next 12 weeks  ProgressTowards Goals: Progressing  Interventions: CBT  Summary: Kimoni CHARL WELLEN is a 28 y.o. female who presents with bipolar 2 disorder and GAD. She appeared alert and oriented x5. She confirmed she was diagnosed with COVID last week but was sick the week prior as well. She feels better now and is hoping she can return to work. Marvis noted being out of work has caused financial strain. She reported an episode of passive SI last week. She denied plan/intent. She was able to call friends to work thru the episode. Tayva was unable to identify a trigger for the episode. She engaged in writing exercise and actively listened to psychoeducation. She was able to identify her patterns of thinking and restructure those thoughts. She expressed the new thoughts feel "weird" like her healthy  relationship also does at times. Aaliyah will continue to build her awareness and practice changing unhealthy thinking patterns.   Therapist Response: Conducted session with Mikeila. Began session with check-in/update since previous session. Utilized empathetic and reflective listening. Used open-ended questions to facilitate discussion. Completed verbal risk assessment. Engaged Tyshay in writing exercise, "Cracking the NUTS and eliminating the ANTS." Provided psychoeducation on cognitive distortions and assisted with identifying/restructuring Amayrany's thoughts. Encouraged her to practice building her awareness to her thinking and restructuring unhelpful thoughts.   Suicidal/Homicidal: No, without intent/plan, Reported passive SI last week, was able to call friends for safety plan  Plan: Return again in 2 weeks.  Diagnosis: Bipolar 2 disorder, major depressive episode (HCC)  Collaboration of Care: Medication Management AEB chart review  Patient/Guardian was advised Release of Information must be obtained prior to any record release in order to collaborate their care with an outside provider. Patient/Guardian was advised if they have not already done so to contact the registration department to sign all necessary forms in order for [name] to release information regarding their care.   Consent: Patient/Guardian gives verbal consent for treatment and assignment of benefits for services provided during this visit. Patient/Guardian expressed understanding and agreed to proceed.   Edmonia Lynch, [facility] 11/11/2023

## 2023-11-11 NOTE — Progress Notes (Unsigned)
   THERAPIST PROGRESS NOTE  Session Time: 8:10 AM - 8:45 AM  Participation Level: Active  Behavioral Response: Casual, Alert, Euthymic  Type of Therapy: Individual Therapy  Treatment Goals addressed: BIPOLAR DISORDER-MANIA/HYPOMANIA  LTG: "I want to be ready to be able to completely believe in myself that I can get off my medicines and still stay as well balance as I am on them." [name] will effectively identify triggers, manage symptoms, and reduce episodes over the next year.    Dates: Start:  10/04/23    Expected End:  10/02/24    Goal: STG: "I will be having a great day and then I will get home and just be so sad that I'm alone." Victoria Simmons will identify cognitive patterns and beliefs that trigger symptoms and learn to restructure unhealthy beliefs.      Goal: STG - Other Anxiety Goal (Specify) "I want to work on driving. I don't know what happened, but when I get in the drivers seat I get so anxious. I don't have my license. Last time I drove I like blacked out." Victoria Simmons will identify distorted thinking patterns that contribute to anxiety symptoms and create an exposure ladder to practice behavioral engagement over the next 12 weeks  ProgressTowards Goals: Progressing  Interventions: CBT  Summary: Victoria Simmons is a 28 y.o. female who presents with bipolar 2 disorder and GAD. She appeared alert and oriented x5. She stated she was diagnosed with COVID last week but was sick the week prior. She expressed feeling better and hopes to return to work today. She noted being out of work has caused financial strain. Victoria Simmons reported a brief, passive episode of SI last week. She was unable to identify a trigger but noted she was able to call friends and reported that was helpful in redirecting her thoughts. She engaged in writing exercise and was able to identify and restructure negative thoughts she wrote. She expressed how the new thoughts feel "weird" like her relationship, which is healthy for the  first time. She will continue to practice identifying her negative thinking and restructuring it.   Therapist Response: Conducted session with Victoria Simmons. Began session with check-in/update since previous session. Used empathetic and reflective listening. Completed verbal risk assessment and discussed safety planning. Engaged in writing exercise, "Cracking the NUTS and eliminating the ANTS." Provided psychoeducation on cognitive distortions and assisted with identifying and restructuring negative thoughts. Encouraged Victoria Simmons to continue building her awareness around her thinking and actively changing unhealthy patterns. Confirmed next appointment and concluded session.   Suicidal/Homicidal: No, reports passive SI episode last week, denies plan/intent, was able to follow safety plan/contact friends  Plan: Return again in 2 weeks.  Diagnosis: Bipolar 2 disorder, major depressive episode (HCC)  Collaboration of Care: Medication Management AEB chart review  Patient/Guardian was advised Release of Information must be obtained prior to any record release in order to collaborate their care with an outside provider. Patient/Guardian was advised if they have not already done so to contact the registration department to sign all necessary forms in order for [name] to release information regarding their care.   Consent: Patient/Guardian gives verbal consent for treatment and assignment of benefits for services provided during this visit. Patient/Guardian expressed understanding and agreed to proceed.   Edmonia Lynch, [facility] 11/11/2023

## 2023-11-11 NOTE — Progress Notes (Unsigned)
   THERAPIST PROGRESS NOTE  Session Time: ***  Participation Level: {BHH PARTICIPATION LEVEL:22264}  Behavioral Response: {Appearance:22683}{BHH LEVEL OF CONSCIOUSNESS:22305}{BHH MOOD:22306}  Type of Therapy: {CHL AMB BH Type of Therapy:21022741}  Treatment Goals addressed: ***  ProgressTowards Goals: {Progress Towards Goals:21014066}  Interventions: {CHL AMB BH Type of Intervention:21022753}  Summary: Victoria Simmons is a 28 y.o. female who presents with ***.   Suicidal/Homicidal: {BHH YES OR NO:22294}{yes/no/with/without intent/plan:22693}  Therapist Response: ***  Plan: Return again in *** weeks.  Diagnosis: Bipolar 2 disorder, major depressive episode (HCC)  Collaboration of Care: {BH OP Collaboration of Care:21014065}  Patient/Guardian was advised Release of Information must be obtained prior to any record release in order to collaborate their care with an outside provider. Patient/Guardian was advised if they have not already done so to contact the registration department to sign all necessary forms in order for [name] to release information regarding their care.   Consent: Patient/Guardian gives verbal consent for treatment and assignment of benefits for services provided during this visit. Patient/Guardian expressed understanding and agreed to proceed.   Edmonia Lynch, [facility] 11/11/2023

## 2023-11-11 NOTE — Progress Notes (Unsigned)
   THERAPIST PROGRESS NOTE  Session Time: 8  Participation Level: Active  Behavioral Response: CasualAlertEuthymic  Type of Therapy: Individual Therapy  Treatment Goals addressed: ***  ProgressTowards Goals: {Progress Towards Goals:21014066}  STG: "I will be having a great day and then I will get home and just be so sad that I'm alone." [name] will identify cognitive patterns and beliefs that trigger symptoms and learn to restructure unhealthy beliefs.    Dates: Start:  10/04/23    Expected End:  10/02/24      Disciplines: Interdisciplinary, PROVIDER       Goal: LTG: "I want to be ready to be able to completely believe in myself that I can get off my medicines and still stay as well balance as I am on them." [name] will effectively identify triggers, manage symptoms, and reduce episodes over the next year.    Dates: Start:  10/04/23    Expected End:  10/01/25      Disciplines: Interdisciplinary, PROVIDER       Goal: STG - Other Anxiety Goal (Specify)    Dates: Start:  10/04/23    Expected End:  10/02/24      Description: "I want to work on driving. I don't know what happened, but when I get in the drivers seat I get so anxious. I don't have my license. Last time I drove I like blacked out." [name] will identify distorted thinking patterns that contribute to anxiety symptoms and create an exposure ladder to practice behavioral engagement over the next 12 weeks    Interventions: CBT  Summary: Victoria Simmons is a 29 y.o. female who presents with bipolar.   Suicidal/Homicidal: Nowithout intent/plan  Therapist Response: Conducted session  Plan: Return again in 2 weeks.  Diagnosis: Bipolar 2 disorder, major depressive episode (HCC)  Collaboration of Care: Medication Management AEB chart review   Patient/Guardian was advised Release of Information must be obtained prior to any record release in order to collaborate their care with an outside provider. Patient/Guardian was  advised if they have not already done so to contact the registration department to sign all necessary forms in order for [name] to release information regarding their care.   Consent: Patient/Guardian gives verbal consent for treatment and assignment of benefits for services provided during this visit. Patient/Guardian expressed understanding and agreed to proceed.   Edmonia Lynch, [facility] 11/11/2023

## 2023-11-11 NOTE — Progress Notes (Unsigned)
   THERAPIST PROGRESS NOTE  Session Time: 8:10 AM - 8:45 AM   Participation Level: {BHH PARTICIPATION LEVEL:22264}  Behavioral Response: {Appearance:22683}{BHH LEVEL OF CONSCIOUSNESS:22305}{BHH MOOD:22306}  Type of Therapy: {CHL AMB BH Type of Therapy:21022741}  Treatment Goals addressed: ***  ProgressTowards Goals: {Progress Towards Goals:21014066}  Interventions: {CHL AMB BH Type of Intervention:21022753}  Summary: Victoria Simmons is a 28 y.o. female who presents with ***.   Suicidal/Homicidal: {BHH YES OR NO:22294}{yes/no/with/without intent/plan:22693}  Therapist Response: Updates, SI, NUTS/ANTS  Plan: Return again in *** weeks.  Diagnosis: No diagnosis found.  Collaboration of Care: {BH OP Collaboration of Care:21014065}  Patient/Guardian was advised Release of Information must be obtained prior to any record release in order to collaborate their care with an outside provider. Patient/Guardian was advised if they have not already done so to contact the registration department to sign all necessary forms in order for [name] to release information regarding their care.   Consent: Patient/Guardian gives verbal consent for treatment and assignment of benefits for services provided during this visit. Patient/Guardian expressed understanding and agreed to proceed.   Edmonia Lynch, [facility] 11/11/2023

## 2023-11-11 NOTE — Progress Notes (Unsigned)
   THERAPIST PROGRESS NOTE  Session Time: 8:10 AM - 8:45 AM   Participation Level: Active  Behavioral Response: CasualAlertEuthymic  Type of Therapy: Individual Therapy  Treatment Goals addressed:  LTG: "I want to be ready to be able to completely believe in myself that I can get off my medicines and still stay as well balance as I am on them." Legna will effectively identify triggers, manage symptoms, and reduce episodes over the next year.    Dates: Start:  10/04/23    Expected End:  10/01/25     STG: "I will be having a great day and then I will get home and just be so sad that I'm alone." Teneisha will identify cognitive patterns and beliefs that trigger symptoms and learn to restructure unhealthy beliefs.   STG - Other Anxiety Goal (Specify) "I want to work on driving. I don't know what happened, but when I get in the drivers seat I get so anxious. I don't have my license. Last time I drove I like blacked out." Moselle will identify distorted thinking patterns that contribute to anxiety symptoms and create an exposure ladder to practice behavioral engagement over the next 12 weeks  ProgressTowards Goals: Progressing  Interventions: CBT  Summary: Derricka DUHA ABAIR is a 28 y.o. female who presents with bipolar 2 disorder and GAD.   Suicidal/Homicidal: No, reports passive SI last week without intent/plan, utilized safety plan and called friends  Therapist Response: Conducted session with Kashay.  Plan: Return again in 2 weeks.  Diagnosis: Bipolar 2 disorder, major depressive episode (HCC)  Collaboration of Care: Medication Management AEB chart review  Patient/Guardian was advised Release of Information must be obtained prior to any record release in order to collaborate their care with an outside provider. Patient/Guardian was advised if they have not already done so to contact the registration department to sign all necessary forms in order for [name] to release information regarding  their care.   Consent: Patient/Guardian gives verbal consent for treatment and assignment of benefits for services provided during this visit. Patient/Guardian expressed understanding and agreed to proceed.   Edmonia Lynch, [facility] 11/11/2023

## 2023-11-11 NOTE — Progress Notes (Unsigned)
   THERAPIST PROGRESS NOTE  Session Time: 8:10 AM   Participation Level: {BHH PARTICIPATION LEVEL:22264}  Behavioral Response: {Appearance:22683}{BHH LEVEL OF CONSCIOUSNESS:22305}{BHH MOOD:22306}  Type of Therapy: {CHL AMB BH Type of Therapy:21022741}  Treatment Goals addressed: ***  ProgressTowards Goals: {Progress Towards Goals:21014066}  Interventions: {CHL AMB BH Type of Intervention:21022753}  Summary: Victoria Simmons is a 28 y.o. female who presents with ***.   Suicidal/Homicidal: {BHH YES OR NO:22294}{yes/no/with/without intent/plan:22693}  Therapist Response: ***  Plan: Return again in *** weeks.  Diagnosis: No diagnosis found.  Collaboration of Care: {BH OP Collaboration of Care:21014065}  Patient/Guardian was advised Release of Information must be obtained prior to any record release in order to collaborate their care with an outside provider. Patient/Guardian was advised if they have not already done so to contact the registration department to sign all necessary forms in order for [name] to release information regarding their care.   Consent: Patient/Guardian gives verbal consent for treatment and assignment of benefits for services provided during this visit. Patient/Guardian expressed understanding and agreed to proceed.   Edmonia Lynch, [facility] 11/11/2023

## 2023-11-11 NOTE — Progress Notes (Unsigned)
   THERAPIST PROGRESS NOTE  Session Time: 8:10 AM - 8:45 AM   Participation Level: Active  Behavioral Response: CasualAlertEuthymic  Type of Therapy: Individual Therapy  Treatment Goals addressed: Active  BH CCP BIPOLAR DISORDER-MANIA/HYPOMANIA    LTG: "I want to be ready to be able to completely believe in myself that I can get off my medicines and still stay as well balance as I am on them." Victoria Simmons will effectively identify triggers, manage symptoms, and reduce episodes over the next year.                Start:  10/04/23    Expected End:  10/01/25   STG: "I will be having a great day and then I will get home and just be so sad that I'm alone." Victoria Simmons will identify cognitive patterns and beliefs that trigger symptoms and learn to restructure unhealthy beliefs.    STG - Other Anxiety Goal (Specify) "I want to work on driving. I don't know what happened, but when I get in the drivers seat I get so anxious. I don't have my license. Last time I drove I like blacked out." Victoria Simmons will identify distorted thinking patterns that contribute to anxiety symptoms and create an exposure ladder to practice behavioral engagement over the next 12 weeks  ProgressTowards Goals: Progressing  Interventions: CBT  Summary: Victoria Simmons is a 28 y.o. female who presents with bipolar 2 disorder and GAD. She appeared alert and oriented x5. She reported she was diagnosed with COVID last week but felt she had it initially with a respiratory infection. She is feeling better and hopes to be able to return to work today. She noted being out of work has caused financial stress. Victoria Simmons also reported a passive episode of SI. She called friends and her partner to help her redirect. She denied intent on SI since she has experienced this in the past. Victoria Simmons engaged in writing exercise. She actively listened to types of cognitive distortions. She was able to restructure her negative thoughts. She will try to build her  awareness and change her thoughts. She expressed it feels "weird." She also noted her current relationship feels weird because it's healthy and she hasn't been used to that. Victoria Simmons is already scheduled to be seen in two weeks.   Therapist Response: Conducted session with Victoria Simmons. Began session with check-in/update since previous session. Utilized empathetic and reflective listening. Used open-ended questions to facilitate discussion. Engaged in writing exercise, "Cracking the NUTS and eliminating the ANTS." Provided psychoeducation on cognitive distortions and assisted with identifying and restructuring Victoria Simmons' thoughts. Encouraged Victoria Simmons to continue building awareness to her thoughts patterns and restructuring if she can. Confirmed next session and concluded session.   Suicidal/Homicidal: No, Reported passive episode last week, utilized friends/partner as support, denies intent  Plan: Return again in 2 weeks.  Diagnosis: Bipolar 2 disorder, major depressive episode (HCC)  Collaboration of Care: Medication Management AEB chart review  Patient/Guardian was advised Release of Information must be obtained prior to any record release in order to collaborate their care with an outside provider. Patient/Guardian was advised if they have not already done so to contact the registration department to sign all necessary forms in order for [name] to release information regarding their care.   Consent: Patient/Guardian gives verbal consent for treatment and assignment of benefits for services provided during this visit. Patient/Guardian expressed understanding and agreed to proceed.   Edmonia Lynch, [facility] 11/11/2023

## 2023-11-12 ENCOUNTER — Other Ambulatory Visit (HOSPITAL_COMMUNITY): Payer: Self-pay | Admitting: Psychiatry

## 2023-11-12 DIAGNOSIS — F3181 Bipolar II disorder: Secondary | ICD-10-CM

## 2023-11-12 NOTE — Progress Notes (Unsigned)
THERAPIST PROGRESS NOTE  Session Time: 8:10 AM - 8:35 AM   Participation Level: Active  Behavioral Response: Casual, Alert, Euthymic  Type of Therapy: Individual Therapy  Treatment Goals addressed: BH Bipolar disorder LTG: "I want to be ready to be able to completely believe in myself that I can get off my medicines and still stay as well balance as I am on them." Azya will effectively identify triggers, manage symptoms, and reduce episodes over the next year.    Dates: Start:  10/04/23    Expected End:  10/01/25     STG: "I will be having a great day and then I will get home and just be so sad that I'm alone." Lynann will identify cognitive patterns and beliefs that trigger symptoms and learn to restructure unhealthy beliefs.   STG - Other Anxiety Goal (Specify)  "I want to work on driving. I don't know what happened, but when I get in the drivers seat I get so anxious. I don't have my license. Last time I drove I like blacked out." Olie will identify distorted thinking patterns that contribute to anxiety symptoms and create an exposure ladder to practice behavioral engagement over the next 12 weeks  ProgressTowards Goals: Progressing  Interventions: CBT  Summary: Jacquese GWYNETH FERNANDEZ is a 28 y.o. female who presents with bipolar 2 disorder and GAD. She appeared alert and oriented x5. She confirmed she was diagnosed with COVID last week but was sick the week prior as well. She feels better now and is hoping she can return to work. Meridith noted being out of work has caused financial strain. She reported an episode of passive SI last week. She denied plan/intent. She was able to call friends to work thru the episode. Zaida was unable to identify a trigger for the episode. She engaged in writing exercise and actively listened to psychoeducation. She was able to identify her patterns of thinking and restructure those thoughts. She expressed the new thoughts feel "weird" like her healthy  relationship also does at times. Lashandra will continue to build her awareness and practice changing unhealthy thinking patterns.   Therapist Response: Conducted session with Ayaan. Began session with check-in/update since previous session. Utilized empathetic and reflective listening. Used open-ended questions to facilitate discussion. Completed verbal risk assessment. Engaged Nguyet in writing exercise, "Cracking the NUTS and eliminating the ANTS." Provided psychoeducation on cognitive distortions and assisted with identifying/restructuring Kimetha's thoughts. Encouraged her to practice building her awareness to her thinking and restructuring unhelpful thoughts.   Suicidal/Homicidal: No, without intent/plan, Reported passive SI last week, was able to call friends for safety plan  Plan: Return again in 2 weeks.  Diagnosis: Bipolar 2 disorder, major depressive episode (HCC)  Collaboration of Care: Medication Management AEB chart review  Patient/Guardian was advised Release of Information must be obtained prior to any record release in order to collaborate their care with an outside provider. Patient/Guardian was advised if they have not already done so to contact the registration department to sign all necessary forms in order for [name] to release information regarding their care.   Consent: Patient/Guardian gives verbal consent for treatment and assignment of benefits for services provided during this visit. Patient/Guardian expressed understanding and agreed to proceed.   Edmonia Lynch, [facility] 11/11/2023

## 2023-11-25 ENCOUNTER — Ambulatory Visit (INDEPENDENT_AMBULATORY_CARE_PROVIDER_SITE_OTHER): Payer: MEDICAID | Admitting: Professional Counselor

## 2023-11-25 DIAGNOSIS — F3181 Bipolar II disorder: Secondary | ICD-10-CM | POA: Diagnosis not present

## 2023-11-25 NOTE — Progress Notes (Signed)
  THERAPIST PROGRESS NOTE  Session Time: 8:00 AM - 8:42 AM   Participation Level: Active  Behavioral Response: Casual, Alert, Anxious  Type of Therapy: Individual Therapy  Treatment Goals addressed:  BH Bipolar disorder LTG: "I want to be ready to be able to completely believe in myself that I can get off my medicines and still stay as well balance as I am on them." Victoria Simmons will effectively identify triggers, manage symptoms, and reduce episodes over the next year.              Dates:  Start:  10/04/23    Expected End:  10/01/25      STG: "I will be having a great day and then I will get home and just be so sad that I'm alone." Victoria Simmons will identify cognitive patterns and beliefs that trigger symptoms and learn to restructure unhealthy beliefs.    STG - Other Anxiety Goal (Specify)  "I want to work on driving. I don't know what happened, but when I get in the drivers seat I get so anxious. I don't have my license. Last time I drove I like blacked out." Victoria Simmons will identify distorted thinking patterns that contribute to anxiety symptoms and create an exposure ladder to practice behavioral engagement over the next 12 weeks  ProgressTowards Goals: Progressing  Interventions: CBT  Summary: Victoria Simmons is a 28 y.o. female who presents with a history of bipolar disorder and GAD. She appeared alert and oriented x5. She was casually dressed and appropriately groomed. She stated she spent the weekend with her girlfriend, celebrating Valentine's Day. She reported they discussed her interest in returning to school and need to obtain her drivers license so she can have the independence to complete that goal. Marquitta actively listened to exposure and cycle information. She engaged in completing a hierarchy and identifying her level of distress with each step. She engaged in imaginal exposure. She was able to achieve a slight reduction in anxiety. She engaged in identifying an affirmation to help, "I can  handle it." She will practice between now and next session.   Therapist Response: Conducted session with Victoria Simmons. Began session with check-in/update since previous session. Utilized empathetic and reflective listening. Used open-ended questions to facilitate discussion. Explained exposure hierarchy and cycle of avoidance/anxiety. Assisted with creating a hierarchy around driving. Began imaginal exposure during session. Assisted with identifying an affirmation to help build feelings of empowerment. Provided additional sheets to document practice. Encouraged reducing level of distress by half before moving to next step. Scheduled additional session and concluded session.   Suicidal/Homicidal: No  Plan: Return again in 2 weeks.  Diagnosis: Bipolar 2 disorder, major depressive episode (HCC)  Collaboration of Care: Medication Management AEB chart review  Patient/Guardian was advised Release of Information must be obtained prior to any record release in order to collaborate their care with an outside provider. Patient/Guardian was advised if they have not already done so to contact the registration department to sign all necessary forms in order for [name] to release information regarding their care.   Consent: Patient/Guardian gives verbal consent for treatment and assignment of benefits for services provided during this visit. Patient/Guardian expressed understanding and agreed to proceed.   Victoria Simmons, [facility] 11/25/2023

## 2023-11-27 ENCOUNTER — Telehealth (INDEPENDENT_AMBULATORY_CARE_PROVIDER_SITE_OTHER): Payer: MEDICAID | Admitting: Child and Adolescent Psychiatry

## 2023-11-27 DIAGNOSIS — F3181 Bipolar II disorder: Secondary | ICD-10-CM

## 2023-11-27 DIAGNOSIS — F411 Generalized anxiety disorder: Secondary | ICD-10-CM | POA: Diagnosis not present

## 2023-11-27 MED ORDER — FLUVOXAMINE MALEATE 100 MG PO TABS: 200.0000 mg | ORAL_TABLET | Freq: Every day | ORAL | 1 refills | Status: DC

## 2023-11-27 MED ORDER — LAMOTRIGINE 100 MG PO TABS: 150.0000 mg | ORAL_TABLET | Freq: Every day | ORAL | 1 refills | Status: DC

## 2023-11-27 NOTE — Progress Notes (Signed)
Virtual Visit via Video Note  I connected with Victoria Simmons on 11/27/23 at 10:00 AM EST by a video enabled telemedicine application and verified that I am speaking with the correct person using two identifiers.  Location: Patient: at work Provider: office   I discussed the limitations of evaluation and management by telemedicine and the availability of in person appointments. The patient expressed understanding and agreed to proceed.    I discussed the assessment and treatment plan with the patient. The patient was provided an opportunity to ask questions and all were answered. The patient agreed with the plan and demonstrated an understanding of the instructions.   The patient was advised to call back or seek an in-person evaluation if the symptoms worsen or if the condition fails to improve as anticipated.    Darcel Smalling, MD      [facility] MD/PA/NP OP Progress Note  11/27/2023 10:30 AM Victoria Simmons  MRN:  [id number]  Chief Complaint: "I am doing pretty good..."  Synopsis: Victoria Simmons is a 28 year old Caucasian female, employed at [employer] and [employer] store in [place], with history of bipolar disorder type II, GAD, insomnia was seen by Dr. Daleen Bo since 2017 and transition to this writer in April 2020.  She is currently prescribed Lamictal 150 mg once a day, Abilify 10 mg once a day, trazodone 150 mg once a day and Luvox 150 mg once a day.   HPI:   Marcy was seen and evaluated over telemedicine encounter for medication management follow-up.  In the interim since her last appointment she started seeing her new therapist at [facility].  She reported that therapy appointment has been going well, she is seeing her about every 2 weeks.   She denies any new concerns for today's appointment and reported that she has been doing well, and her mood has been "pretty good", her anxiety has been minimal, her work has been going well for her, she is also doing well with her girlfriend, and things are better at  her home with her family.  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  She denied excessive worries or anxiety around it.  She also reported that she is still getting time to do leisurely activities and she enjoys hanging out with her girlfriend and her friends.  She has been sleeping well however has noted over the last 2 months that in the morning when she wakes up she is still tired.  We discussed that if she is sleeping well then she can reduce the dose of trazodone to 50 mg at night and increase it back to 100 if she starts having more sleep problem.  She verbalized understanding.  She reported that she has been doing well in regards of her appetite.  She denied any SI or HI.   She reported that she has been consistently taking her medications and denied any side effects associated with them.  We discussed to continue with current medications because of the stability in her symptoms and follow-up again in about 3 months or earlier if needed.  She verbalized understanding and agreed with this plan.  Visit Diagnosis:    ICD-10-CM   1. GAD (generalized anxiety disorder)  F41.1 fluvoxaMINE (LUVOX) 100 MG tablet    2. Bipolar 2 disorder, major depressive episode (HCC)  F31.81 lamoTRIgine (LAMICTAL) 100 MG tablet  Past Psychiatric History: PPHx reviewed today and as followin. 2 previous inpatient admission at [facility] at the age of 23, and last at [facility], no previous suicide attempts, has history of cutting and her past medication trials include Prozac and Zoloft.  Decreasing Luvox to 50 mg worsened her symptoms of anxiety therefore it was increased back to 100 mg once a day.   Previous trials of Zoloft 150 mg and Prozac 10 mg stopped due to inefficacy.   Past Medical History:  Past Medical History:  Diagnosis Date   Anxiety    Bipolar 2 disorder (HCC)    Depression    Dysmenorrhea     Hypertriglyceridemia 2020   MDD (major depressive disorder) 06/19/2017   Ovarian torsion 2008    Past Surgical History:  Procedure Laterality Date   COLONOSCOPY  08/2020   polyp, int hem, benign biopsies ([facility])   ESOPHAGOGASTRODUODENOSCOPY  08/2020   LA grade A reflux esophagitis and antral gastritis, H pylori neg, treated with PPI BID and carafate course ([facility]   OVARY SURGERY  06/30/2007   dx laparoscopy, reduction of right adnexal torsion, bilateral oophoropexy of each ovary to posterior uterine fundus    Family Psychiatric History: As mentioned in initial H&P, reviewed today, no change Family History:  Family History  Problem Relation Age of Onset   Hepatitis B Mother    Anxiety disorder Mother    Hypertension Father    ADD / ADHD Brother    Ovarian cancer Paternal Grandmother     Social History:  Social History   Socioeconomic History   Marital status: Divorced    Spouse name: Not on file   Number of children: 0   Years of education: Not on file   Highest education level: Some college, no degree  Occupational History   Occupation: [employer]    Comment: full time  Tobacco Use   Smoking status: Never    Passive exposure: Past   Smokeless tobacco: Never  Vaping Use   Vaping status: Former  Substance and Sexual Activity   Alcohol use: Yes    Comment: Occasionally   Drug use: Not Currently    Types: Marijuana    Comment: last use 16 Aug 2020   Sexual activity: Yes  Other Topics Concern   Not on file  Social History Narrative          Social Drivers of Health   Financial Resource Strain: High Risk (09/17/2023)   Overall Financial Resource Strain (CARDIA)    Difficulty of Paying Living Expenses: Very hard  Food Insecurity: No Food Insecurity (09/17/2023)   Hunger Vital Sign    Worried About Running Out of Food in the Last Year: Never true    Ran Out of Food in the Last Year: Never true  Transportation Needs: No Transportation Needs  (09/17/2023)   PRAPARE - Administrator, Civil Service (Medical): No    Lack of Transportation (Non-Medical): No  Physical Activity: Sufficiently Active (09/17/2023)   Exercise Vital Sign    Days of Exercise per Week: 5 days    Minutes of Exercise per Session: 120 min  Stress: No Stress Concern Present (09/17/2023)   [employer] of Occupational Health - Occupational Stress Questionnaire    Feeling of Stress : Only a little  Social Connections: Socially Isolated (09/17/2023)   Social Connection and Isolation Panel [NHANES]    Frequency of Communication with Friends and Family: More than three times a week    Frequency  of Social Gatherings with Friends and Family: Twice a week    Attends Religious Services: Never    Database administrator or Organizations: No    Attends Engineer, structural: Never    Marital Status: Divorced    Allergies: No Known Allergies  Metabolic Disorder Labs: Lab Results  Component Value Date   HGBA1C 5.0 07/26/2020   MPG 97 07/26/2020   MPG 94 05/14/2017   No results found for: "PROLACTIN" Lab Results  Component Value Date   CHOL 206 (H) 06/18/2023   TRIG 193.0 (H) 06/18/2023   HDL 42.20 06/18/2023   CHOLHDL 5 06/18/2023   VLDL 38.6 06/18/2023   LDLCALC 125 (H) 06/18/2023   LDLCALC 154 (H) 07/26/2020   Lab Results  Component Value Date   TSH 4.67 06/18/2023   TSH 2.70 09/12/2021    Therapeutic Level Labs: No results found for: "LITHIUM" No results found for: "VALPROATE" No results found for: "CBMZ"  Current Medications: Current Outpatient Medications  Medication Sig Dispense Refill   fluvoxaMINE (LUVOX) 100 MG tablet Take 2 tablets (200 mg total) by mouth at bedtime. 180 tablet 1   hydrOXYzine (ATARAX) 10 MG tablet Take 10 mg by mouth 3 (three) times daily as needed.     lamoTRIgine (LAMICTAL) 100 MG tablet Take 1&1/2 tablets (150 mg total) by mouth at bedtime. 135 tablet 1   ondansetron (ZOFRAN-ODT) 4 MG  disintegrating tablet Take 1 tablet (4 mg total) by mouth every 8 (eight) hours as needed for nausea or vomiting. 20 tablet 0   QUEtiapine (SEROQUEL) 100 MG tablet Take 1 tablet (100 mg total) by mouth at bedtime. 90 tablet 1   traZODone (DESYREL) 100 MG tablet Take 1 tablet (100 mg total) by mouth at bedtime. 90 tablet 1   triamcinolone (KENALOG) 0.1 % paste Use as directed 1 Application in the mouth or throat 2 (two) times daily. 5 g 0   No current facility-administered medications for this visit.     Musculoskeletal: Strength & Muscle Tone: unable to assess since visit was over the telemedicine.  Gait & Station: unable to assess since visit was over the telemedicine.  Patient leans: N/A  Psychiatric Specialty Exam: ROSReview of 12 systems negative except as mentioned in HPI   There were no vitals taken for this visit.There is no height or weight on file to calculate BMI.  General Appearance: Casual and Well Groomed  Eye Contact:  Good  Speech:  Clear and Coherent and Normal Rate  Volume:  Normal  Mood: "pretty good..."  Affect:  Appropriate, Congruent, and Full Range  Thought Process:  Goal Directed and Linear  Orientation:  Full (Time, Place, and Person)  Thought Content: Logical   Suicidal Thoughts:  No  Homicidal Thoughts:  No  Memory:  Immediate;   Fair Recent;   Fair Remote;   Fair  Judgement:  Good  Insight:  Good  Psychomotor Activity:  Normal  Concentration:  Concentration: Good and Attention Span: Good  Recall:  Good  Fund of Knowledge: Good  Language: Good  Akathisia:  No    AIMS (if indicated): not done  Assets:  Communication Skills Desire for Improvement Financial Resources/Insurance Housing Leisure Time Physical Health Social Support Transportation Vocational/Educational  ADL's:  Intact  Cognition: WNL  Sleep:  Good     Screenings: AIMS    Flowsheet Row Admission (Discharged) from [facility]/13/2017 in [facility]  AIMS Total  Score 0      AUDIT  Flowsheet Row Counselor from 09/17/2023 in [facility]) [facility] (Discharged) from 05/13/2017 in [facility]INE  Alcohol Use Disorder Identification Test Final Score (AUDIT) 6 6 0      GAD-7    Flowsheet Row Counselor from 09/17/2023 in [facility] Office Visit from 09/13/2023 in [facility] Office Visit from 06/25/2023 in [facility] [place] Counselor from 01/02/2023 in [facility] Counselor from 07/30/2022 in [facility]  Total GAD-7 Score 4 6 2 15 11       PHQ2-9    Flowsheet Row Counselor from 09/17/2023 in [facility] Office Visit from 09/13/2023 in [facility] [place] Office Visit from 06/25/2023 in [facility] [place] Counselor from 02/01/2023 in [facility] Counselor from 01/08/2023 in [facility]  PHQ-2 Total Score 1 0 0 0 6  PHQ-9 Total Score 7 6 3 2 23       Flowsheet Row ED from 10/22/2023 in [facility] Counselor from 09/17/2023 in [facility] Counselor from 03/25/2023 in [facility] at [facility] RISK CATEGORY No Risk Moderate Risk Error: Q3, 4, or 5 should not be populated when Q2 is No        Assessment and Plan:   Amandeep is a 28 yr old Caucasian female who is single, employed, has a history of bipolar disorder, generalized anxiety disorder, insomnia evaluated today for a follow-up visit for medication management.  Reviewed response to her current medications and she  appears to have continued stability with her mood and anxiety and therefore recommending to continue with current medications as mentioned below in the plan.    Reviewed response to her current medications and she appears to have continued stability with her mood and anxiety, she did her   Plan as below.  Reviewed on 11/27/23   Plan  Bipolar 2 disorder (chronic, stable) Continue with lamotrigine 150 mg daily.  Continue Luvox  200 mg p.o. daily Continue Seroquel 100 mg daily at bedtime    For generalized anxiety disorder-(chronic and stable) -Was previously seeing therapist at [facility] however has not seen since she has left the practice.  Referred her to a new therapist at the clinic. Luvox as mentioned above.  Medications above will be helpful in decreasing anxiety. Previously tried Prozac upto 10 mg daily and Zoloft 150 mg daily - stopped due to inefficacy.    For insomnia-chronic and stable Decrease trazodone to 50 mg, can increase back to 100 mg if needed for sleep. Can take Atarax 25-50 mg QHS PRN for sleep.    Labs:  She had blood work in September 2024, her total cholesterol was 206 and her LDL was 125, which seems to be better as compared to her blood work 3 years ago however her triglyceride was elevated, and was noted at 196.  Rest of the labs appeared stable.  Previously discussed lifestyle interventions including exercise as well as healthy eating.  She was receptive to this.    This note was generated in part or whole with voice recognition software. Voice recognition is usually quite accurate but there are transcription errors that can and very often do occur.  I apologize for any typographical errors that were not detected and corrected.    Darcel Smalling, MD 11/27/2023, 10:25 AM

## 2023-12-09 ENCOUNTER — Ambulatory Visit: Payer: Self-pay | Admitting: Family Medicine

## 2023-12-09 ENCOUNTER — Emergency Department (HOSPITAL_BASED_OUTPATIENT_CLINIC_OR_DEPARTMENT_OTHER)
Admission: EM | Admit: 2023-12-09 | Discharge: 2023-12-09 | Disposition: A | Discharge status: Home / Self Care | Payer: MEDICAID | Attending: Emergency Medicine | Admitting: Emergency Medicine

## 2023-12-09 ENCOUNTER — Other Ambulatory Visit: Payer: Self-pay

## 2023-12-09 ENCOUNTER — Emergency Department (HOSPITAL_BASED_OUTPATIENT_CLINIC_OR_DEPARTMENT_OTHER): Payer: MEDICAID

## 2023-12-09 ENCOUNTER — Encounter (HOSPITAL_BASED_OUTPATIENT_CLINIC_OR_DEPARTMENT_OTHER): Payer: Self-pay | Admitting: Emergency Medicine

## 2023-12-09 DIAGNOSIS — R059 Cough, unspecified: Secondary | ICD-10-CM | POA: Diagnosis present

## 2023-12-09 DIAGNOSIS — J101 Influenza due to other identified influenza virus with other respiratory manifestations: Secondary | ICD-10-CM | POA: Diagnosis not present

## 2023-12-09 DIAGNOSIS — J020 Streptococcal pharyngitis: Secondary | ICD-10-CM | POA: Diagnosis not present

## 2023-12-09 LAB — CBC WITH DIFFERENTIAL/PLATELET
Abs Immature Granulocytes: 0.02 10*3/uL (ref 0.00–0.07)
Basophils Absolute: 0 10*3/uL (ref 0.0–0.1)
Basophils Relative: 0 %
Eosinophils Absolute: 0 10*3/uL (ref 0.0–0.5)
Eosinophils Relative: 0 %
HCT: 41 % (ref 36.0–46.0)
Hemoglobin: 13.6 g/dL (ref 12.0–15.0)
Immature Granulocytes: 0 %
Lymphocytes Relative: 13 %
Lymphs Abs: 0.9 10*3/uL (ref 0.7–4.0)
MCH: 28.5 pg (ref 26.0–34.0)
MCHC: 33.2 g/dL (ref 30.0–36.0)
MCV: 86 fL (ref 80.0–100.0)
Monocytes Absolute: 0.7 10*3/uL (ref 0.1–1.0)
Monocytes Relative: 10 %
Neutro Abs: 5 10*3/uL (ref 1.7–7.7)
Neutrophils Relative %: 77 %
Platelets: 169 10*3/uL (ref 150–400)
RBC: 4.77 MIL/uL (ref 3.87–5.11)
RDW: 13 % (ref 11.5–15.5)
WBC: 6.6 10*3/uL (ref 4.0–10.5)
nRBC: 0 % (ref 0.0–0.2)

## 2023-12-09 LAB — RESP PANEL BY RT-PCR (RSV, FLU A&B, COVID)  RVPGX2
Influenza A by PCR: POSITIVE — AB
Influenza B by PCR: NEGATIVE
Resp Syncytial Virus by PCR: NEGATIVE
SARS Coronavirus 2 by RT PCR: NEGATIVE

## 2023-12-09 LAB — BASIC METABOLIC PANEL
Anion gap: 11 (ref 5–15)
BUN: 12 mg/dL (ref 6–20)
CO2: 21 mmol/L — ABNORMAL LOW (ref 22–32)
Calcium: 8.9 mg/dL (ref 8.9–10.3)
Chloride: 104 mmol/L (ref 98–111)
Creatinine, Ser: 1.03 mg/dL — ABNORMAL HIGH (ref 0.44–1.00)
GFR, Estimated: >60 mL/min (ref >60)
Glucose, Bld: 90 mg/dL (ref 70–99)
Potassium: 3.6 mmol/L (ref 3.5–5.1)
Sodium: 136 mmol/L (ref 135–145)

## 2023-12-09 LAB — GROUP A STREP BY PCR: Group A Strep by PCR: DETECTED — AB

## 2023-12-09 MED ORDER — AMOXICILLIN 500 MG PO CAPS: 1000.0000 mg | ORAL_CAPSULE | Freq: Every day | ORAL | 0 refills | Status: AC

## 2023-12-09 MED ORDER — DEXAMETHASONE SODIUM PHOSPHATE 10 MG/ML IJ SOLN
10.0000 mg | Freq: Once | INTRAMUSCULAR | Status: AC
  Administered 2023-12-09: 10 mg via INTRAVENOUS
  Filled 2023-12-09: qty 1

## 2023-12-09 MED ORDER — SODIUM CHLORIDE 0.9 % IV BOLUS
1000.0000 mL | Freq: Once | INTRAVENOUS | Status: AC
  Administered 2023-12-09: 1000 mL via INTRAVENOUS

## 2023-12-09 MED ORDER — KETOROLAC TROMETHAMINE 15 MG/ML IJ SOLN
15.0000 mg | Freq: Once | INTRAMUSCULAR | Status: AC
Start: 2023-12-09 — End: 2023-12-09
  Administered 2023-12-09: 15 mg via INTRAVENOUS
  Filled 2023-12-09: qty 1

## 2023-12-09 NOTE — ED Triage Notes (Signed)
 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

## 2023-12-09 NOTE — Telephone Encounter (Signed)
 Spoke with Victoria Simmons relaying Dr Timoteo Expose message. Victoria Simmons verbalizes understanding but would not confirm if she was going to ED or not.

## 2023-12-09 NOTE — Telephone Encounter (Signed)
 Had COVID 11/05/2023. This is something different - possible influenza.  Agree she needs eval, likely at ER if concern for dehydration

## 2023-12-09 NOTE — ED Provider Notes (Signed)
 [facility] Provider Note   CSN: [id number] Arrival date & time: 12/09/23  1427     History  Chief Complaint  Patient presents with   Cough    Victoria Simmons is a 28 y.o. female.  Patient with bipolar disorder, esophagitis --presents with fever, sore throat, body aches, cough, weakness over the past 3 days.  She reports decreased oral intake.  She has been urinating less.  She feels dizzy and tired.  She reports some vomiting.  No diarrhea.  She has been taking over-the-counter medications without improvement.       Home Medications Prior to Admission medications   Medication Sig Start Date End Date Taking? Authorizing Provider  fluvoxaMINE (LUVOX) 100 MG tablet Take 2 tablets (200 mg total) by mouth at bedtime. 11/27/23   Darcel Smalling, MD  hydrOXYzine (ATARAX) 10 MG tablet Take 10 mg by mouth 3 (three) times daily as needed.    [provider]  lamoTRIgine (LAMICTAL) 100 MG tablet Take 1&1/2 tablets (150 mg total) by mouth at bedtime. 11/27/23   Darcel Smalling, MD  ondansetron (ZOFRAN-ODT) 4 MG disintegrating tablet Take 1 tablet (4 mg total) by mouth every 8 (eight) hours as needed for nausea or vomiting. 06/25/23   Eustaquio Boyden, MD  QUEtiapine (SEROQUEL) 100 MG tablet Take 1 tablet (100 mg total) by mouth at bedtime. 08/22/23   Darcel Smalling, MD  traZODone (DESYREL) 100 MG tablet Take 1 tablet (100 mg total) by mouth at bedtime. 08/22/23   Darcel Smalling, MD  triamcinolone (KENALOG) 0.1 % paste Use as directed 1 Application in the mouth or throat 2 (two) times daily. 06/25/23   Eustaquio Boyden, MD  dicyclomine (BENTYL) 10 MG capsule Take 1 capsule (10 mg total) by mouth 4 (four) times daily as needed for spasms. Patient not taking: Reported on 07/26/2020 07/08/20 07/26/20  Arnaldo Natal, NP      Allergies    Patient has no known allergies.    Review of Systems   Review of Systems  Physical  Exam Updated Vital Signs BP 110/74 (BP Location: Left Arm)   Pulse (!) 102   Temp 100.3 F (37.9 C) (Oral)   Resp 20   Ht 5\' 5"  (1.651 m)   Wt 86.2 kg   LMP 11/29/2023   SpO2 99%   BMI 31.62 kg/m  Physical Exam Vitals and nursing note reviewed.  Constitutional:      General: She is not in acute distress.    Appearance: She is well-developed.  HENT:     Head: Normocephalic and atraumatic.     Right Ear: Tympanic membrane, ear canal and external ear normal.     Left Ear: Tympanic membrane, ear canal and external ear normal.     Nose: Nose normal.     Mouth/Throat:     Pharynx: Oropharyngeal exudate and posterior oropharyngeal erythema present.     Tonsils: No tonsillar exudate or tonsillar abscesses.  Eyes:     Conjunctiva/sclera: Conjunctivae normal.  Cardiovascular:     Rate and Rhythm: Regular rhythm. Tachycardia present.     Heart sounds: No murmur heard. Pulmonary:     Effort: No respiratory distress.     Breath sounds: No wheezing, rhonchi or rales.  Abdominal:     Palpations: Abdomen is soft.     Tenderness: There is no abdominal tenderness. There is no guarding or rebound.  Musculoskeletal:     Cervical back: Normal  range of motion and neck supple.     Right lower leg: No edema.     Left lower leg: No edema.  Skin:    General: Skin is warm and dry.     Findings: No rash.  Neurological:     General: No focal deficit present.     Mental Status: She is alert. Mental status is at baseline.     Motor: No weakness.  Psychiatric:        Mood and Affect: Mood normal.     ED Results / Procedures / Treatments   Labs (all labs ordered are listed, but only abnormal results are displayed) Labs Reviewed  GROUP A STREP BY PCR - Abnormal; Notable for the following components:      Result Value   Group A Strep by PCR DETECTED (*)    All other components within normal limits  RESP PANEL BY RT-PCR (RSV, FLU A&B, COVID)  RVPGX2 - Abnormal; Notable for the following  components:   Influenza A by PCR POSITIVE (*)    All other components within normal limits  BASIC METABOLIC PANEL - Abnormal; Notable for the following components:   CO2 21 (*)    Creatinine, Ser 1.03 (*)    All other components within normal limits  CBC WITH DIFFERENTIAL/PLATELET  URINALYSIS, ROUTINE W REFLEX MICROSCOPIC    EKG EKG Interpretation Date/Time:  Monday December 09 2023 15:11:00 EST Ventricular Rate:  106 PR Interval:  136 QRS Duration:  78 QT Interval:  314 QTC Calculation: 417 R Axis:   109  Text Interpretation: Sinus tachycardia Rightward axis Low voltage QRS T wave abnormality, consider inferior ischemia Abnormal ECG When compared with ECG of 27-Jul-2020 19:15, PREVIOUS ECG IS PRESENT agree, non specific T wave changes since last tracing Confirmed by Arby Barrette [id number]) on 12/09/2023 3:28:30 PM  Radiology DG Chest 2 View Result Date: 12/09/2023 CLINICAL DATA:  Abnormal EKG.  Body aches. EXAM: CHEST - 2 VIEW COMPARISON:  None Available. FINDINGS: The cardiomediastinal contours are normal. Hazy bibasilar opacities. Pulmonary vasculature is normal. No pleural effusion or pneumothorax. No acute osseous abnormalities are seen. IMPRESSION: Hazy bibasilar opacity may represent atelectasis or infection. Electronically Signed   By: Narda Rutherford M.D.   On: 12/09/2023 18:00    Procedures Procedures    Medications Ordered in ED Medications - No data to display  ED Course/ Medical Decision Making/ A&P    Patient seen and examined. History obtained directly from patient. Work-up including labs, imaging, EKG ordered in triage, if performed, were reviewed.    Labs/EKG: Independently reviewed and interpreted.  This included: CBC unremarkable; BMP unremarkable, creatinine stable; flu and strep test positive.  Imaging: Independently visualized and interpreted.  This included: Chest x-ray, agree no consolidative pneumonia  Medications/Fluids: Ordered: IV fluid bolus, IV  Decadron, IV Toradol   Initial impression: Influenza A, streptococcal pharyngitis  9:22 PM Reassessment performed. Patient appears stable, she states that she is feeling a bit better.  Comfortable discharge to home.  Reviewed pertinent lab work and imaging with patient at bedside. Questions answered.   Most current vital signs reviewed and are as follows: BP 117/82   Pulse 91   Temp 100.3 F (37.9 C)   Resp 20   Ht 5\' 5"  (1.651 m)   Wt 86.2 kg   LMP 11/29/2023   SpO2 98%   BMI 31.62 kg/m   Plan: Discharge to home.   Prescriptions written for: Amoxicillin  Other home care instructions discussed: OTC  meds, Tylenol/ibuprofen, maintain good hydration  ED return instructions discussed: New or worsening symptoms, inability to swallow, difficulty breathing or shortness of breath  Follow-up instructions discussed: Patient encouraged to follow-up with their PCP in 5 days if not improving.                                Medical Decision Making Amount and/or Complexity of Data Reviewed Labs: ordered. Radiology: ordered.  Risk Prescription drug management.   Patient with flulike illness and significant sore throat.  She has tested positive for influenza and streptococcal pharyngitis.  She was treated for her symptoms and overall is improved.  Vital signs are reassuring.  No respiratory distress.  Tolerating fluids.  She will need to hydrate well and rest.  The patient's vital signs, pertinent lab work and imaging were reviewed and interpreted as discussed in the ED course. Hospitalization was considered for further testing, treatments, or serial exams/observation. However as patient is well-appearing, has a stable exam, and reassuring studies today, I do not feel that they warrant admission at this time. This plan was discussed with the patient who verbalizes agreement and comfort with this plan and seems reliable and able to return to the Emergency Department with worsening or  changing symptoms.          Final Clinical Impression(s) / ED Diagnoses Final diagnoses:  Influenza A  Streptococcal pharyngitis    Rx / DC Orders ED Discharge Orders          Ordered    amoxicillin (AMOXIL) 500 MG capsule  Daily        12/09/23 2119              Renne Crigler, PA-C 12/09/23 2123    Arby Barrette, MD 12/15/23 [phone]

## 2023-12-09 NOTE — Telephone Encounter (Signed)
 Chief Complaint: Fever of 103 Symptoms: Fever, headache, sore throat, cough, vomiting over weekend, decreased urine output Frequency: since weekend Pertinent Negatives: Patient denies neck stiffness, numbness or tingling Disposition: [x] ED /[] Urgent Care (no appt availability in office) / [] Appointment(In office/virtual)/ []  [facility]/ [] Home Care/ [] Refused Recommended Disposition /[] [facility]/ []  Follow-up with PCP Additional Notes: patient's mother called in stating patient has a fever of 103, body aches, vomiting over the weekend. Patient states she is barely urinating and hasn't urinated today. Patient states her urine is darker than normal. Patient advised to be seen and evaluated in ED. Mother states she will continue to monitor patient and courage fluids and electrolytes and if she doesn't improve she will go to ED today for evaluation.   Copied from CRM [id number]. Topic: Clinical - Red Word Triage >> Dec 09, 2023 10:03 AM Lennart Pall wrote: Red Word that prompted transfer to Nurse Triage: Patient mom Dawn took temp and it was 103, not sure if thermometer is working correct. high fever, chills, sweating. She recently had covid. Reason for Disposition  [1] Drinking very little AND [2] dehydration suspected (e.g., no urine > 12 hours, very dry mouth, very lightheaded)  Answer Assessment - Initial Assessment Questions 1. TEMPERATURE: "What is the most recent temperature?"  "How was it measured?"      103 - half hour ago 2. ONSET: "When did the fever start?"      Last night  3. CHILLS: "Do you have chills?" If yes: "How bad are they?"  (e.g., none, mild, moderate, severe)   - NONE: no chills   - MILD: feeling cold   - MODERATE: feeling very cold, some shivering (feels better under a thick blanket)   - SEVERE: feeling extremely cold with shaking chills (general body shaking, rigors; even under a thick blanket)      Moderate 4. OTHER SYMPTOMS: "Do you have any other  symptoms besides the fever?"  (e.g., abdomen pain, cough, diarrhea, earache, headache, sore throat, urination pain)     Sweating, body aches, headache, fever, vomiting over the weekend 5. CAUSE: If there are no symptoms, ask: "What do you think is causing the fever?"      Accompanied by illness symptoms 6. CONTACTS: "Does anyone else in the family have an infection?"     Works at daycare  7. TREATMENT: "What have you done so far to treat this fever?" (e.g., medications)     Tylenol 8. IMMUNOCOMPROMISE: "Do you have of the following: diabetes, HIV positive, splenectomy, cancer chemotherapy, chronic steroid treatment, transplant patient, etc."     No 9. PREGNANCY: "Is there any chance you are pregnant?" "When was your last menstrual period?"     No 10. TRAVEL: "Have you traveled out of the country in the last month?" (e.g., travel history, exposures)       No  Protocols used: [facility]

## 2023-12-09 NOTE — Discharge Instructions (Signed)
 Please read and follow all provided instructions.  Your diagnoses today include:  1. Influenza A   2. Streptococcal pharyngitis     Tests performed today include: Complete blood cell count: Normal blood cell counts Basic metabolic panel: Chemistry normal Flu test and strep test were positive Chest x-ray without definite pneumonia Vital signs. See below for your results today.   Medications prescribed:  Amoxicillin - antibiotic  You have been prescribed an antibiotic medicine: take the entire course of medicine even if you are feeling better. Stopping early can cause the antibiotic not to work.  Take any prescribed medications only as directed.  Home care instructions:  Follow any educational materials contained in this packet. Please continue drinking plenty of fluids. Use over-the-counter cold and flu medications as needed as directed on packaging for symptom relief. You may also use ibuprofen or tylenol as directed on packaging for pain or fever.   BE VERY CAREFUL not to take multiple medicines containing Tylenol (also called acetaminophen). Doing so can lead to an overdose which can damage your liver and cause liver failure and possibly death.   Follow-up instructions: Please follow-up with your primary care provider in the next 5 days for further evaluation of your symptoms if not feeling better.   Return instructions:  Please return to the Emergency Department if you experience worsening symptoms. Please return if you have a high fever greater than 101 degrees not controlled with over-the-counter medications, persistent vomiting and cannot keep down fluids, or worsening trouble breathing. Please return if you have any other emergent concerns.  Additional Information:  Your vital signs today were: BP 117/82   Pulse 91   Temp 100.3 F (37.9 C)   Resp 20   Ht 5\' 5"  (1.651 m)   Wt 86.2 kg   LMP 11/29/2023   SpO2 98%   BMI 31.62 kg/m  If your blood pressure (BP) was  elevated above 135/85 this visit, please have this repeated by your doctor within one month.

## 2023-12-10 ENCOUNTER — Ambulatory Visit (INDEPENDENT_AMBULATORY_CARE_PROVIDER_SITE_OTHER): Payer: MEDICAID | Admitting: Professional Counselor

## 2023-12-10 DIAGNOSIS — Z91199 Patient's noncompliance with other medical treatment and regimen due to unspecified reason: Secondary | ICD-10-CM

## 2023-12-10 NOTE — Progress Notes (Signed)
 Patient no-showed today's appointment; appointment was for 12/10/23 at 8 AM for follow-up outpatient therapy. Pt called and reported she was diagnosed with flu and strep yesterday. Confirmed in record.

## 2023-12-11 ENCOUNTER — Ambulatory Visit: Payer: Self-pay | Admitting: Family Medicine

## 2023-12-11 ENCOUNTER — Encounter: Payer: Self-pay | Admitting: Family Medicine

## 2023-12-11 NOTE — Telephone Encounter (Signed)
  Chief Complaint: Blood in urine-when asked patient stated her urine was very dark.  Symptoms: blood in urine, back pain, feeling like she isn't able to empty her bladder out completely Frequency: Tuesday morning after being in the ED Pertinent Negatives: Patient denies fever Disposition: [] ED /[] Urgent Care (no appt availability in office) / [x] Appointment(In office/virtual)/ []  [facility]/ [] Home Care/ [] Refused Recommended Disposition /[] [facility]/ []  Follow-up with PCP Additional Notes: patient sent message to provider via MyChart. Message was forwards to Nurse Triage. Patient called and this RN spoke to her. Patient states she was seen in ED on Monday night-testing positive for flu and strep along with dehydration. Patient states she had been eating or drinking much since the previous Saturday. Patient states Tuesday morning she found that she had blood in her urine along with back pain. This RN asked if urine had been checked in the ED and stated she had been unable to give a urine sample and didn't urinate until Tuesday morning. Per protocol, the recommendation is for patient to be seen for an appointment. Appointment scheduled with another provider in PCP office tomorrow at 11:00 am. Patient verbalized understanding of plan and all questions answered.   Reason for Disposition  Side (flank) or lower back pain present  Answer Assessment - Initial Assessment Questions 1. SYMPTOM: "What's the main symptom you're concerned about?" (e.g., frequency, incontinence)     Blood in urine along with not being able to empty bladder completely 2. ONSET: "When did the  Blood in urine and not being able to empty bladder completely  start?"     Started Tuesday morning 3. PAIN: "Is there any pain?" If Yes, ask: "How bad is it?" (Scale: 1-10; mild, moderate, severe)     7-8 out of 10 4. CAUSE: "What do you think is causing the symptoms?"     unsure 5. OTHER SYMPTOMS: "Do you have  any other symptoms?" (e.g., blood in urine, fever, flank pain, pain with urination)     Back pain 6. PREGNANCY: "Is there any chance you are pregnant?" "When was your last menstrual period?"     11/29/2023  Protocols used: Urinary Symptoms-A-AH

## 2023-12-12 ENCOUNTER — Encounter: Payer: Self-pay | Admitting: Family Medicine

## 2023-12-12 ENCOUNTER — Ambulatory Visit: Payer: MEDICAID | Admitting: Family Medicine

## 2023-12-12 VITALS — BP 90/70 | HR 96 | Temp 97.5°F | Ht 65.75 in | Wt 183.0 lb

## 2023-12-12 DIAGNOSIS — N939 Abnormal uterine and vaginal bleeding, unspecified: Secondary | ICD-10-CM

## 2023-12-12 DIAGNOSIS — Z113 Encounter for screening for infections with a predominantly sexual mode of transmission: Secondary | ICD-10-CM

## 2023-12-12 DIAGNOSIS — R319 Hematuria, unspecified: Secondary | ICD-10-CM | POA: Diagnosis not present

## 2023-12-12 DIAGNOSIS — K625 Hemorrhage of anus and rectum: Secondary | ICD-10-CM

## 2023-12-12 DIAGNOSIS — N898 Other specified noninflammatory disorders of vagina: Secondary | ICD-10-CM

## 2023-12-12 LAB — POC URINALSYSI DIPSTICK (AUTOMATED)
Bilirubin, UA: NEGATIVE
Glucose, UA: NEGATIVE
Ketones, UA: NEGATIVE
Leukocytes, UA: NEGATIVE
Nitrite, UA: NEGATIVE
Protein, UA: POSITIVE — AB
Spec Grav, UA: 1.025 (ref 1.010–1.025)
Urobilinogen, UA: 0.2 U/dL
pH, UA: 5.5 (ref 5.0–8.0)

## 2023-12-12 NOTE — Progress Notes (Signed)
 Victoria Simmons T. Victoria Condie, MD, CAQ Sports Medicine [facility] [address] [place], [address]  Phone: [phone]  FAX: [phone]  Victoria Simmons - 28 y.o. female  MRN [id number]  Date of Birth: [birth year]  Date: 12/12/2023  PCP: Eustaquio Boyden, MD  Referral: Eustaquio Boyden, MD  Chief Complaint  Patient presents with   Back Pain   Hematuria   Subjective:   Victoria Simmons is a 28 y.o. very pleasant female patient with Body mass index is 29.76 kg/m. who presents with the following:  Flu and strep right now on Amox BID  Hematuria Went to the hospital and got some fluids and really dehydrated - dx flu and strep Dark urine with only a trickle No urine sample at the ER  102-99 Temp yesterday Cough is getting worse Head hurts Body aches  Blood in urine Blood in vaginal discharge Blood in bowel movement Stomach is hurting Throwing up stomach acid  Quit smoking MJ a few weeks ago - formerly heavy smoker MJ  STD scr Pap - UTD Gon / CMZ  She is having some mild back pain  Review of Systems is noted in the HPI, as appropriate  Objective:   BP 90/70 (BP Location: Left Arm, Patient Position: Sitting, Cuff Size: Normal)   Pulse 96   Temp (!) 97.5 F (36.4 C) (Temporal)   Ht 5' 5.75" (1.67 m)   Wt 183 lb (83 kg)   LMP 11/29/2023   SpO2 96%   BMI 29.76 kg/m   GEN: No acute distress; alert,appropriate. PSYCH: Normally interactive.  CV: RRR, no m/g/r  PULM: Normal respiratory rate, no accessory muscle use. No wheezes, crackles or rhonchi  ABD: S, NT, ND, + BS, No rebound, No HSM  GU: This portion of the physical examination was chaperoned by Terese Door, CMA.  She does have blood in the vaginal vault and blood coming from cervical os.  She has no significant cervical motion tenderness and she has no tenderness or mass in the region of the ovaries.  Laboratory and Imaging Data: Results for orders placed or performed  in visit on 12/12/23  POCT Urinalysis Dipstick (Automated)   Collection Time: 12/12/23 11:40 AM  Result Value Ref Range   Color, UA Yellow    Clarity, UA Clear    Glucose, UA Negative Negative   Bilirubin, UA Negative    Ketones, UA Negative    Spec Grav, UA 1.025 1.010 - 1.025   Blood, UA Large (3+)    pH, UA 5.5 5.0 - 8.0   Protein, UA Positive (A) Negative   Urobilinogen, UA 0.2 0.2 or 1.0 E.U./dL   Nitrite, UA Negative    Leukocytes, UA Negative Negative    Lab Review:     Latest Ref Rng & Units 12/09/2023    3:31 PM 06/18/2023    8:26 AM 09/29/2022   11:44 AM  CBC EXTENDED  WBC 4.0 - 10.5 K/uL 6.6  7.4  8.3   RBC 3.87 - 5.11 MIL/uL 4.77  5.06  5.11   Hemoglobin 12.0 - 15.0 g/dL 10.2  72.5  36.6   HCT 36.0 - 46.0 % 41.0  43.9  44.1   Platelets 150 - 400 K/uL 169  243.0  252   NEUT# 1.7 - 7.7 K/uL 5.0  4.1  6.1   Lymph# 0.7 - 4.0 K/uL 0.9  2.5  1.6        Latest Ref Rng & Units 12/09/2023  6:51 PM 06/18/2023    8:26 AM 09/29/2022   11:44 AM  BMP  Glucose 70 - 99 mg/dL 90  88  96   BUN 6 - 20 mg/dL 12  16  9    Creatinine 0.44 - 1.00 mg/dL 1.61  0.96  0.45   Sodium 135 - 145 mmol/L 136  140  140   Potassium 3.5 - 5.1 mmol/L 3.6  3.8  3.8   Chloride 98 - 111 mmol/L 104  107  108   CO2 22 - 32 mmol/L 21  23  23    Calcium 8.9 - 10.3 mg/dL 8.9  9.3  9.3        Latest Ref Rng & Units 06/18/2023    8:26 AM 09/29/2022   11:44 AM 09/12/2021   12:11 PM  Hepatic Function  Total Protein 6.0 - 8.3 g/dL 7.0  7.9  7.4   Albumin 3.5 - 5.2 g/dL 4.2  4.4  4.4   AST 0 - 37 U/L 13  16  12    ALT 0 - 35 U/L 17  19  11    Alk Phosphatase 39 - 117 U/L 64  56  63   Total Bilirubin 0.2 - 1.2 mg/dL 0.3  0.7  0.4     Lab Results  Component Value Date   CHOL 206 (H) 06/18/2023   Lab Results  Component Value Date   HDL 42.20 06/18/2023   Lab Results  Component Value Date   LDLCALC 125 (H) 06/18/2023   Lab Results  Component Value Date   TRIG 193.0 (H) 06/18/2023   Lab  Results  Component Value Date   CHOLHDL 5 06/18/2023   No results for input(s): "PSA" in the last 72 hours. No results found for: "HCVAB" Lab Results  Component Value Date   VD25OH 29.62 (L) 04/05/2020     Lab Results  Component Value Date   HGBA1C 5.0 07/26/2020   HGBA1C 4.9 05/14/2017   HGBA1C 4.9 01/01/2013   Lab Results  Component Value Date   LDLCALC 125 (H) 06/18/2023   CREATININE 1.03 (H) 12/09/2023     Assessment and Plan:     ICD-10-CM   1. Hematuria, unspecified type  R31.9 POCT Urinalysis Dipstick (Automated)    Urine Culture    2. Vaginal bleeding  N93.9 Trichomonas vaginalis, RNA    C. trachomatis/N. gonorrhoeae RNA    RPR    HIV Antibody (routine testing w rflx)    HIV Antibody (routine testing w rflx)    RPR    CANCELED: HIV Antibody (routine testing w rflx)    CANCELED: RPR    3. Vaginal discharge  N89.8 Trichomonas vaginalis, RNA    C. trachomatis/N. gonorrhoeae RNA    RPR    HIV Antibody (routine testing w rflx)    HIV Antibody (routine testing w rflx)    RPR    CANCELED: HIV Antibody (routine testing w rflx)    CANCELED: RPR    4. BRBPR (bright red blood per rectum)  K62.5     5. Screen for STD (sexually transmitted disease)  Z11.3 Trichomonas vaginalis, RNA    C. trachomatis/N. gonorrhoeae RNA    RPR    HIV Antibody (routine testing w rflx)    HIV Antibody (routine testing w rflx)    RPR    CANCELED: HIV Antibody (routine testing w rflx)    CANCELED: RPR     Blood seems to be pretty obviously coming from cervical os.  She reports.  He had a bleeding and using multiple large absorbent pads.  Menses did stop last week and has since resumed with vaginal bleeding.  She does have a history of heavy periods in the past.  Given blood and vaginal bleeding concerns, to do a full STD workup including gonorrhea and chlamydia.  There is some blood in the urine.  She does have some back pain, and renal stone cannot be excluded.  I did encourage  her to drink plenty of fluid for now.  If she continues to have intermittent heavy bleeding, she is going to consider going back on a hormonal IUD.  OCPs could also be considered.  Medication Management during today's office visit: No orders of the defined types were placed in this encounter.  Medications Discontinued During This Encounter  Medication Reason   dicyclomine (BENTYL) 10 MG capsule Completed Course    Orders placed today for conditions managed today: Orders Placed This Encounter  Procedures   Urine Culture   Trichomonas vaginalis, RNA   C. trachomatis/N. gonorrhoeae RNA   RPR   HIV Antibody (routine testing w rflx)   POCT Urinalysis Dipstick (Automated)    Disposition: No follow-ups on file.  [employer] speech-to-text software was used for transcription in this dictation.  Possible transcriptional errors can occur using Animal nutritionist.   Signed,  Elpidio Galea. Thao Bauza, MD   Outpatient Encounter Medications as of 12/12/2023  Medication Sig   amoxicillin (AMOXIL) 500 MG capsule Take 2 capsules (1,000 mg total) by mouth daily for 10 days.   fluvoxaMINE (LUVOX) 100 MG tablet Take 2 tablets (200 mg total) by mouth at bedtime.   hydrOXYzine (ATARAX) 10 MG tablet Take 10 mg by mouth 3 (three) times daily as needed.   lamoTRIgine (LAMICTAL) 100 MG tablet Take 1&1/2 tablets (150 mg total) by mouth at bedtime.   ondansetron (ZOFRAN-ODT) 4 MG disintegrating tablet Take 1 tablet (4 mg total) by mouth every 8 (eight) hours as needed for nausea or vomiting.   QUEtiapine (SEROQUEL) 100 MG tablet Take 1 tablet (100 mg total) by mouth at bedtime.   traZODone (DESYREL) 100 MG tablet Take 1 tablet (100 mg total) by mouth at bedtime.   triamcinolone (KENALOG) 0.1 % paste Use as directed 1 Application in the mouth or throat 2 (two) times daily.   [DISCONTINUED] dicyclomine (BENTYL) 10 MG capsule Take 1 capsule (10 mg total) by mouth 4 (four) times daily as needed for spasms.  (Patient not taking: Reported on 07/26/2020)   No facility-administered encounter medications on file as of 12/12/2023.

## 2023-12-13 LAB — HIV ANTIBODY (ROUTINE TESTING W REFLEX): HIV 1&2 Ab, 4th Generation: NONREACTIVE

## 2023-12-13 LAB — URINE CULTURE
MICRO NUMBER:: 16168307
Result:: NO GROWTH
SPECIMEN QUALITY:: ADEQUATE

## 2023-12-13 LAB — TRICHOMONAS VAGINALIS, PROBE AMP: Trichomonas vaginalis RNA: NOT DETECTED

## 2023-12-13 LAB — C. TRACHOMATIS/N. GONORRHOEAE RNA
C. trachomatis RNA, TMA: NOT DETECTED
N. gonorrhoeae RNA, TMA: NOT DETECTED

## 2023-12-13 LAB — RPR: RPR Ser Ql: NONREACTIVE

## 2023-12-14 ENCOUNTER — Encounter: Payer: Self-pay | Admitting: Family Medicine

## 2023-12-16 ENCOUNTER — Ambulatory Visit: Payer: Self-pay | Admitting: Family Medicine

## 2023-12-16 NOTE — Telephone Encounter (Signed)
 Chief Complaint: "dehydration" Symptoms: few drops of dark urine, vomiting 3-5 episodes daily, constant abdominal pain, lower back pain, headaches, SOB with exertion, fatigue, dizziness Frequency: onset 12/11/23 Pertinent Negatives: Patient denies diarrhea, fever, falls, recent head injury, blood in vomit. Disposition: [x] ED /[] Urgent Care (no appt availability in office) / [] Appointment(In office/virtual)/ []  [facility]/ [] Home Care/ [] Refused Recommended Disposition /[] [facility]/ []  Follow-up with PCP Additional Notes: Patient calling in for triage, states 2 weekends ago she went to ED. She was diagnosed with flu and strep throat (placed on antibiotics) on 12/09/23. Patient states she was also diagnosed with COVID about 2-3 weeks prior to flu/strep. She states they gave her IV hydration but she was not able to urinate. She states she went for office visit this past week and was told her urine was okay. She states she is feeling very dehydrated, feels dizzy and has to lie down. Patient states she has not had a bowel movement in a week. Patient agreeable to go to ED, states "I was gonna go but I figured I would call you first. Last time I had to wait 5 hours". Patient verbalizes understanding of emergency of symptoms and is agreeable to ED disposition.  Copied from CRM [id number]. Topic: Clinical - Red Word Triage >> Dec 16, 2023  8:04 AM Gurney Maxin H wrote: [facility] that prompted transfer to Nurse Triage: Urine is dark and only drips, when she moves gets out of breath, dizzy and seeing spots. Headache and lower back pain Reason for Disposition  [1] Drinking very little AND [2] dehydration suspected (e.g., no urine > 12 hours, very dry mouth, very lightheaded)  Answer Assessment - Initial Assessment Questions 1. VOMITING SEVERITY: "How many times have you vomited in the past 24 hours?"     - MILD:  1 - 2 times/day    - MODERATE: 3 - 5 times/day, decreased oral intake without  significant weight loss or symptoms of dehydration    - SEVERE: 6 or more times/day, vomits everything or nearly everything, with significant weight loss, symptoms of dehydration      3 episodes daily, can go up to 4-5 episodes she states if she eats something her stomach does not like.  2. ONSET: "When did the vomiting begin?"      12/11/23.  3. FLUIDS: "What fluids or food have you vomited up today?" "Have you been able to keep any fluids down?"     Water and Gatorade. She states she has been able to keep some of the Gatorade down but states the water makes her throw up.  4. ABDOMEN PAIN: "Are your having any abdomen pain?" If Yes : "How bad is it and what does it feel like?" (e.g., crampy, dull, intermittent, constant)      Yes, constant tight or cramping feeling. 8/10. Started around 12/11/23.  5. DIARRHEA: "Is there any diarrhea?" If Yes, ask: "How many times today?"      Denies.  6. CONTACTS: "Is there anyone else in the family with the same symptoms?"      Denies.  7. CAUSE: "What do you think is causing your vomiting?"     Patient placed on antibiotics on 12/09/23 and states even when she eats with the antibiotics worse with nausea and vomiting.  8. HYDRATION STATUS: "Any signs of dehydration?" (e.g., dry mouth [not only dry lips], too weak to stand) "When did you last urinate?"     Dizziness, headache, urinated this morning around 0600 (dark  and just a few drops)  9. OTHER SYMPTOMS: "Do you have any other symptoms?" (e.g., fever, headache, vertigo, vomiting blood or coffee grounds, recent head injury)     Vomiting bile (yellow/clear), lower back pain, SOB with exertion (even just getting up from bedroom and walking to couch).  10. PREGNANCY: "Is there any chance you are pregnant?" "When was your last menstrual period?"       LMP 12/09/23.  Protocols used: Vomiting-A-AH

## 2023-12-16 NOTE — Telephone Encounter (Signed)
 Will await ER eval - plz call tomorrow for update on symptoms.

## 2023-12-16 NOTE — Telephone Encounter (Signed)
 Noted.

## 2023-12-17 ENCOUNTER — Other Ambulatory Visit: Payer: Self-pay | Admitting: Family Medicine

## 2023-12-17 MED ORDER — ONDANSETRON 4 MG PO TBDP: 4.0000 mg | ORAL_TABLET | Freq: Three times a day (TID) | ORAL | 0 refills | Status: AC | PRN

## 2023-12-17 NOTE — Telephone Encounter (Signed)
 Spoke with pt asking for update on sxs. States she's feeling better today. She's able to keep down water and Gatorade. Also, able to eat some crackers.

## 2023-12-17 NOTE — Telephone Encounter (Signed)
 Rx sent electronically.

## 2023-12-17 NOTE — Telephone Encounter (Signed)
 Copied from CRM [id number]. Topic: Clinical - Medication Refill >> Dec 17, 2023  4:03 PM Drema Balzarine wrote: Most Recent Primary Care Visit:  Provider: Hannah Beat  Department: [facility]  Visit Type: ACUTE  Date: 12/12/2023  Medication: Zofran   Has the patient contacted their pharmacy? No (Agent: If no, request that the patient contact the pharmacy for the refill. If patient does not wish to contact the pharmacy document the reason why and proceed with request.) (Agent: If yes, when and what did the pharmacy advise?)  Is this the correct pharmacy for this prescription? Yes If no, delete pharmacy and type the correct one.  This is the patient's preferred pharmacy:   [facility] [phone] [facility] [place] - [address] [place] [address] Phone: [phone] Fax: [phone]   Has the prescription been filled recently? Yes  Is the patient out of the medication? Yes  Has the patient been seen for an appointment in the last year OR does the patient have an upcoming appointment? Yes  Can we respond through MyChart? Yes  Agent: Please be advised that Rx refills may take up to 3 business days. We ask that you follow-up with your pharmacy.

## 2023-12-24 ENCOUNTER — Ambulatory Visit (INDEPENDENT_AMBULATORY_CARE_PROVIDER_SITE_OTHER): Payer: MEDICAID | Admitting: Professional Counselor

## 2023-12-24 DIAGNOSIS — F411 Generalized anxiety disorder: Secondary | ICD-10-CM

## 2023-12-24 DIAGNOSIS — F3181 Bipolar II disorder: Secondary | ICD-10-CM | POA: Diagnosis not present

## 2023-12-24 NOTE — Progress Notes (Signed)
   THERAPIST PROGRESS NOTE  Session Time: 8:07 AM - 8:37 AM   Participation Level: Active  Behavioral Response: Casual, Alert, Euthymic  Type of Therapy: Individual Therapy  Treatment Goals addressed: Active BH BIPOLAR DISORDER LTG: "I want to be ready to be able to completely believe in myself that I can get off my medicines and still stay as well balance as I am on them." Dalayna will effectively identify triggers, manage symptoms, and reduce episodes over the next year. (Progressing) Reviewed 12/24/23 "I feel like I'm doing pretty good with it. Since September I've gotten better in most of those areas." Vassie understands the need to maintain medication compliance.    Start:  10/04/23    Expected End:  10/01/25     STG: "I will be having a great day and then I will get home and just be so sad that I'm alone." Cody will identify cognitive patterns and beliefs that trigger symptoms and learn to restructure unhealthy beliefs. (Completed/Met)   12/24/23 - Reports this has not been an issue and she still has the exercises in session to help if she needs to review.   STG - Other Anxiety Goal (Specify) (Progressing) "I want to work on driving. I don't know what happened, but when I get in the drivers seat I get so anxious. I don't have my license. Last time I drove I like blacked out." Asal will identify distorted thinking patterns that contribute to anxiety symptoms and create an exposure ladder to practice behavioral engagement over the next 12 weeks Reviewed 12/24/23 - Reports she is driving in the driveway and continuing to work on exposure   ProgressTowards Goals: Progressing and Met  Interventions: Motivational Interviewing and Supportive  Summary: JALEIAH ASAY is a 28 y.o. female who presents with a history of bipolar disorder and GAD. She appeared alert and oriented x5 today. She stated she is doing better after having the flu and strep throat. She reported anxiety around finances due  to being out of work but feels she is managing it well. Brookelynne scored minimal on anxiety and depression screening. She provided updates to treatment plan goals, meeting most and progressing on the others. She was receptive to discharge from therapy, considering she remains stable for the next month.   Therapist Response: Conducted session with Lucy. Began session with check-in/update since previous session. Utilized empathetic and reflective listening. Administered GAD7 and PHQ9 screening. Reviewed and updated treatment plan with input from Omnia on current strengths, needs, and progress towards goals. Inquired about discharge and agreed for monthly maintenance session. Scheduled appointment and concluded session. Provided Aine with [place] warm line and mobile crisis number if symptoms return/worsen.   Suicidal/Homicidal: No  Plan: Return again in 5 weeks.  Diagnosis: Bipolar 2 disorder, major depressive episode (HCC)  GAD (generalized anxiety disorder)  Collaboration of Care: Medication Management AEB chart review  Patient/Guardian was advised Release of Information must be obtained prior to any record release in order to collaborate their care with an outside provider. Patient/Guardian was advised if they have not already done so to contact the registration department to sign all necessary forms in order for [name] to release information regarding their care.   Consent: Patient/Guardian gives verbal consent for treatment and assignment of benefits for services provided during this visit. Patient/Guardian expressed understanding and agreed to proceed.   Edmonia Lynch, [facility] 12/24/2023

## 2023-12-31 ENCOUNTER — Telehealth: Payer: Self-pay

## 2023-12-31 NOTE — Telephone Encounter (Signed)
 received fax that quetiapine 100mg  was approved from 12-31-23 to 12-30-24

## 2024-01-31 ENCOUNTER — Ambulatory Visit: Payer: MEDICAID | Admitting: Professional Counselor

## 2024-01-31 DIAGNOSIS — F3181 Bipolar II disorder: Secondary | ICD-10-CM | POA: Diagnosis not present

## 2024-01-31 NOTE — Progress Notes (Signed)
 THERAPIST PROGRESS NOTE  Virtual Visit via Video Note  I connected with Victoria Simmons on 01/31/24 at  8:00 AM EDT by a video enabled telemedicine application and verified that I am speaking with the correct person using two identifiers.  Location: Patient: Home Provider: Home office   I discussed the limitations of evaluation and management by telemedicine and the availability of in person appointments. The patient expressed understanding and agreed to proceed.   I discussed the assessment and treatment plan with the patient. The patient was provided an opportunity to ask questions and all were answered. The patient agreed with the plan and demonstrated an understanding of the instructions.   The patient was advised to call back or seek an in-person evaluation if the symptoms worsen or if the condition fails to improve as anticipated.  I provided 16 minutes of non-face-to-face time during this encounter. Len Quale, [facility]  Session Time: 8:03 AM - 8:19 AM  Participation Level: Active  Behavioral Response: Casual, Alert, Euthymic  Type of Therapy: Individual Therapy  Treatment Goals addressed: Active BH BIPOLAR DISORDER LTG: "I want to be ready to be able to completely believe in myself that I can get off my medicines and still stay as well balance as I am on them." Victoria Simmons will effectively identify triggers, manage symptoms, and reduce episodes over the next year. (Progressing) Reviewed 12/24/23 "I feel like I'm doing pretty good with it. Since September I've gotten better in most of those areas." Victoria Simmons understands the need to maintain medication compliance.                Start:  10/04/23    Expected End:  10/01/25      STG: "I will be having a great day and then I will get home and just be so sad that I'm alone." Victoria Simmons will identify cognitive patterns and beliefs that trigger symptoms and learn to restructure unhealthy beliefs. (Completed/Met)              12/24/23 -  Reports this has not been an issue and she still has the exercises in session to help if she needs to review.    STG - Other Anxiety Goal (Specify) (Progressing) "I want to work on driving. I don't know what happened, but when I get in the drivers seat I get so anxious. I don't have my license. Last time I drove I like blacked out." Victoria Simmons will identify distorted thinking patterns that contribute to anxiety symptoms and create an exposure ladder to practice behavioral engagement over the next 12 weeks Reviewed 12/24/23 - Reports she is driving in the driveway and continuing to work on exposure   ProgressTowards Goals: Met  Interventions: CBT, Motivational Interviewing and Supportive  Summary: Victoria Simmons is a 28 y.o. female who presents with a history of bipolar disorder and GAD. She appeared alert and oriented x5. She stated things are continuing to go well for her. She is moving in with her girlfriend this week. Victoria Simmons reported she has quit her current job and is looking for employment in the area where her girlfriend lives. She noted she is still working on driving. She was receptive to WISE MIND to help balance her emotions and logic. Emri denied any major mental health symptoms besides some anxiety during a job interview. She noted skills she has to continue practicing and handouts from previous group sessions if needed. She was in agreement with discharge from therapy and understands she can resume services  if needed in the future. She also has access to crisis services if needed. Victoria Simmons will continue medication management and is aware of her next appointment date.  Therapist Response: Conducted telehealth session with Victoria Simmons. Began session with check-in/update since previous session. Utilized empathetic and reflective listening. Used open-ended questions to facilitate discussion and summarized Eileen' thoughts/feelings. Explained WISE MIND to help continue with making progress on driving goal.  Inquired about coping skills to prevent a regression in symptoms. Reminded Victoria Simmons about services she has access to if needed. Advised her of upcoming medication management appointment. Completed discharge and concluded session.  Suicidal/Homicidal: No  Plan: Successful discharge from therapy.  Diagnosis: Bipolar 2 disorder, major depressive episode (HCC)  Collaboration of Care: Medication Management AEB chart review  Patient/Guardian was advised Release of Information must be obtained prior to any record release in order to collaborate their care with an outside provider. Patient/Guardian was advised if they have not already done so to contact the registration department to sign all necessary forms in order for us  to release information regarding their care.   Consent: Patient/Guardian gives verbal consent for treatment and assignment of benefits for services provided during this visit. Patient/Guardian expressed understanding and agreed to proceed.   Len Quale, [facility] 01/31/2024

## 2024-02-04 ENCOUNTER — Ambulatory Visit: Payer: Self-pay

## 2024-02-05 ENCOUNTER — Ambulatory Visit: Payer: MEDICAID | Admitting: Family Medicine

## 2024-02-11 ENCOUNTER — Ambulatory Visit: Payer: MEDICAID

## 2024-02-13 ENCOUNTER — Ambulatory Visit: Payer: MEDICAID

## 2024-02-15 ENCOUNTER — Other Ambulatory Visit: Payer: Self-pay | Admitting: Child and Adolescent Psychiatry

## 2024-02-15 DIAGNOSIS — F3181 Bipolar II disorder: Secondary | ICD-10-CM

## 2024-02-24 ENCOUNTER — Telehealth: Payer: MEDICAID | Admitting: Child and Adolescent Psychiatry

## 2024-02-24 DIAGNOSIS — F3181 Bipolar II disorder: Secondary | ICD-10-CM | POA: Diagnosis not present

## 2024-02-24 DIAGNOSIS — F411 Generalized anxiety disorder: Secondary | ICD-10-CM | POA: Diagnosis not present

## 2024-02-24 NOTE — Progress Notes (Signed)
 Virtual Visit via Video Note  I connected with Victoria Simmons on 02/24/24 at 10:00 AM EDT by a video enabled telemedicine application and verified that I am speaking with the correct person using two identifiers.  Location: Patient: at work Provider: office   I discussed the limitations of evaluation and management by telemedicine and the availability of in person appointments. The patient expressed understanding and agreed to proceed.    I discussed the assessment and treatment plan with the patient. The patient was provided an opportunity to ask questions and all were answered. The patient agreed with the plan and demonstrated an understanding of the instructions.   The patient was advised to call back or seek an in-person evaluation if the symptoms worsen or if the condition fails to improve as anticipated.    Victoria Bridge, MD      [facility] MD/PA/NP OP Progress Note  02/24/2024 10:30 AM Victoria Simmons  MRN:  [id number]  Chief Complaint: " I am doing pretty good.."  Synopsis: Victoria Simmons is a 28 year old Caucasian female, employed at [employer] and [employer] store in [place], with history of bipolar disorder type II, GAD, insomnia was seen by Dr. Martha Simmons since 2017 and transition to this writer in April 2020.  She is currently prescribed Lamictal  150 mg once a day, Abilify  10 mg once a day, trazodone  150 mg once a day and Luvox  150 mg once a day.   HPI:   Victoria Simmons was seen and evaluated over telemedicine encounter for medication management follow-up.  In the interim since her last appointment she was discharged from ind therapy as she has done well and have achieved stability with her symptoms.    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  She reported that her mood has been "steady", denied any high highs or low lows, also denied  excessive worries or anxiety.  She reported that she has been sleeping well, denied any problems with her appetite, denied SI or HI.  She denied any substance abuse, reported that she tried moonshine at a moonshine testing placed in [place]  while on vacation with her girlfriend but otherwise she has not been drinking alcohol.  She reported that she has been taking her medications as prescribed.  We discussed to continue her current medications because of the stability with her symptoms. We also discussed, that I will be transitioning out of the clinic, and will only have one day at the clinic, therefore it will not be possible for me to continue to see her. I asked general psychiatrists in the clinic for availability, and Dr. Eappen will be see her next in three months. Front desk will call her and schedule her with Dr. Eappen.     Visit Diagnosis:    ICD-10-CM   1. Bipolar 2 disorder, major depressive episode (HCC)  F31.81     2. GAD (generalized anxiety disorder)  F41.1            Past Psychiatric History: PPHx reviewed today and as followin. 2 previous inpatient admission at [facility] at the age of 1, and last at [facility], no previous suicide attempts, has history of cutting and her past medication trials include Prozac  and Zoloft .  Decreasing Luvox  to 50 mg worsened her symptoms of anxiety therefore it was increased back to 100 mg once a day.  Previous trials of Zoloft  150 mg and Prozac  10 mg stopped due to inefficacy.   Past Medical History:  Past Medical History:  Diagnosis Date   Anxiety    Bipolar 2 disorder (HCC)    Depression    Dysmenorrhea    Hypertriglyceridemia 2020   MDD (major depressive disorder) 06/19/2017   Ovarian torsion 2008    Past Surgical History:  Procedure Laterality Date   COLONOSCOPY  08/2020   polyp, int hem, benign biopsies ([facility])   ESOPHAGOGASTRODUODENOSCOPY  08/2020   LA grade A reflux esophagitis and antral gastritis, H pylori neg, treated with PPI  BID and carafate  course ([facility]   OVARY SURGERY  06/30/2007   dx laparoscopy, reduction of right adnexal torsion, bilateral oophoropexy of each ovary to posterior uterine fundus    Family Psychiatric History: As mentioned in initial H&P, reviewed today, no change Family History:  Family History  Problem Relation Age of Onset   Hepatitis B Mother    Anxiety disorder Mother    Hypertension Father    ADD / ADHD Brother    Ovarian cancer Paternal Grandmother     Social History:  Social History   Socioeconomic History   Marital status: Divorced    Spouse name: Not on file   Number of children: 0   Years of education: Not on file   Highest education level: Some college, no degree  Occupational History   Occupation: [employer]    Comment: full time  Tobacco Use   Smoking status: Never    Passive exposure: Past   Smokeless tobacco: Never  Vaping Use   Vaping status: Former  Substance and Sexual Activity   Alcohol use: Yes    Comment: Occasionally   Drug use: Not Currently    Types: Marijuana    Comment: last use 16 Aug 2020   Sexual activity: Yes  Other Topics Concern   Not on file  Social History Narrative          Social Drivers of Health   Financial Resource Strain: High Risk (09/17/2023)   Overall Financial Resource Strain (CARDIA)    Difficulty of Paying Living Expenses: Very hard  Food Insecurity: No Food Insecurity (09/17/2023)   Hunger Vital Sign    Worried About Running Out of Food in the Last Year: Never true    Ran Out of Food in the Last Year: Never true  Transportation Needs: No Transportation Needs (09/17/2023)   PRAPARE - Administrator, Civil Service (Medical): No    Lack of Transportation (Non-Medical): No  Physical Activity: Sufficiently Active (09/17/2023)   Exercise Vital Sign    Days of Exercise per Week: 5 days    Minutes of Exercise per Session: 120 min  Stress: No Stress Concern Present (09/17/2023)   [employer] of Occupational Health - Occupational Stress Questionnaire    Feeling of Stress : Only a little  Social Connections: Socially Isolated (09/17/2023)   Social Connection and Isolation Panel [NHANES]    Frequency of Communication with Friends and Family: More than three times a week    Frequency of Social Gatherings with Friends and Family: Twice a week    Attends Religious Services: Never    Database administrator or Organizations: No    Attends Banker Meetings: Never    Marital Status: Divorced    Allergies: No Known Allergies  Metabolic Disorder Labs: Lab Results  Component Value Date   HGBA1C 5.0 07/26/2020  MPG 97 07/26/2020   MPG 94 05/14/2017   No results found for: "PROLACTIN" Lab Results  Component Value Date   CHOL 206 (H) 06/18/2023   TRIG 193.0 (H) 06/18/2023   HDL 42.20 06/18/2023   CHOLHDL 5 06/18/2023   VLDL 38.6 06/18/2023   LDLCALC 125 (H) 06/18/2023   LDLCALC 154 (H) 07/26/2020   Lab Results  Component Value Date   TSH 4.67 06/18/2023   TSH 2.70 09/12/2021    Therapeutic Level Labs: No results found for: "LITHIUM" No results found for: "VALPROATE" No results found for: "CBMZ"  Current Medications: Current Outpatient Medications  Medication Sig Dispense Refill   fluvoxaMINE  (LUVOX ) 100 MG tablet Take 2 tablets (200 mg total) by mouth at bedtime. 180 tablet 1   hydrOXYzine  (ATARAX ) 10 MG tablet Take 10 mg by mouth 3 (three) times daily as needed.     lamoTRIgine  (LAMICTAL ) 100 MG tablet Take 1&1/2 tablets (150 mg total) by mouth at bedtime. 135 tablet 1   ondansetron  (ZOFRAN -ODT) 4 MG disintegrating tablet Take 1 tablet (4 mg total) by mouth every 8 (eight) hours as needed for nausea or vomiting. 20 tablet 0   QUEtiapine  (SEROQUEL ) 100 MG tablet TAKE 1 TABLET BY MOUTH EVERYDAY AT BEDTIME 90 tablet 1   traZODone  (DESYREL ) 100 MG tablet Take 1 tablet (100 mg total) by mouth at bedtime. 90 tablet 1   triamcinolone  (KENALOG ) 0.1 %  paste Use as directed 1 Application in the mouth or throat 2 (two) times daily. 5 g 0   No current facility-administered medications for this visit.     Musculoskeletal: Strength & Muscle Tone: unable to assess since visit was over the telemedicine.  Gait & Station: unable to assess since visit was over the telemedicine.  Patient leans: N/A  Psychiatric Specialty Exam: ROSReview of 12 systems negative except as mentioned in HPI   There were no vitals taken for this visit.There is no height or weight on file to calculate BMI.  General Appearance: Casual and Well Groomed  Eye Contact:  Good  Speech:  Clear and Coherent and Normal Rate  Volume:  Normal  Mood: "pretty good..."  Affect:  Appropriate, Congruent, and Full Range  Thought Process:  Goal Directed and Linear  Orientation:  Full (Time, Place, and Person)  Thought Content: Logical   Suicidal Thoughts:  No  Homicidal Thoughts:  No  Memory:  Immediate;   Fair Recent;   Fair Remote;   Fair  Judgement:  Good  Insight:  Good  Psychomotor Activity:  Normal  Concentration:  Concentration: Good and Attention Span: Good  Recall:  Good  Fund of Knowledge: Good  Language: Good  Akathisia:  No    AIMS (if indicated): not done  Assets:  Communication Skills Desire for Improvement Financial Resources/Insurance Housing Leisure Time Physical Health Social Support Transportation Vocational/Educational  ADL's:  Intact  Cognition: WNL  Sleep:  Good     Screenings: AIMS    Flowsheet Row Admission (Discharged) from [facility]13/2017 in [facility]  AIMS Total Score 0      AUDIT    Flowsheet Row Counselor from 09/17/2023 in [facility] (Discharged) from 05/13/2017 in [facility] INPATIENT BEHAV[facility]AL MEDICINE  Alcohol Use Disorder Identification Test Final Score (AUDIT) 6 6 0       GAD-7    Flowsheet Row Counselor from 12/24/2023 in [facility] Counselor  from 09/17/2023 in [facility] Office Visit from 09/13/2023 in [facility] Office Visit from 06/25/2023 in [facility] [place] Counselor from 01/02/2023 in [facility]  Total GAD-7 Score 2 4 6 2 15       PHQ2-9    Flowsheet Row Counselor from 12/24/2023 in [facility] Counselor from 09/17/2023 in [facility] Office Visit from 09/13/2023 in [facility] [place] Office Visit from 06/25/2023 in [facility] [place] Counselor from 02/01/2023 in [facility]  PHQ-2 Total Score 0 1 0 0 0  PHQ-9 Total Score 1 7 6 3 2       Flowsheet Row ED from 12/09/2023 in [facility] from 10/22/2023 in [facility] Counselor from 09/17/2023 in [facility]  C-SSRS RISK CATEGORY No Risk No Risk Moderate Risk        Assessment and Plan:   Ariam is a 28 yr old Caucasian female who is single, employed, has a history of bipolar disorder, generalized anxiety disorder, insomnia evaluated today for a follow-up visit for medication management.  Reviewed response to her current medications and she appears to have continued stability with her mood and anxiety therefore recommending to continue her current medications as mentioned during the plan.  Plan as below.  Reviewed on 02/24/24   Plan  Bipolar 2 disorder (chronic, stable) Continue with lamotrigine  150 mg daily.  Continue Luvox   200 mg p.o. daily Continue Seroquel  100 mg daily at bedtime    For generalized anxiety disorder-(chronic and  stable) -Was previously seeing therapist at [facility] however discharged after achieving therapeutic goals and doing well at this time.  - Luvox  as mentioned above.  Medications above will be helpful in decreasing anxiety. Previously tried Prozac  upto 10 mg daily and Zoloft  150 mg daily - stopped due to inefficacy.    For insomnia-chronic and stable Decrease trazodone  to 50 mg, can increase back to 100 mg if needed for sleep. Can take Atarax  25-50 mg QHS PRN for sleep.    Labs:  She had blood work in September 2024, her total cholesterol was 206 and her LDL was 125, which seems to be better as compared to her blood work 3 years ago however her triglyceride was elevated, and was noted at 196.  Rest of the labs appeared stable.  Previously discussed lifestyle interventions including exercise as well as healthy eating.  She was receptive to this.    This note was generated in part or whole with voice recognition software. Voice recognition is usually quite accurate but there are transcription errors that can and very often do occur. I apologize for any typographical errors that were not detected and corrected.    Victoria Bridge, MD 02/24/2024, 10:29 AM

## 2024-03-19 ENCOUNTER — Other Ambulatory Visit: Payer: Self-pay | Admitting: Child and Adolescent Psychiatry

## 2024-05-11 ENCOUNTER — Other Ambulatory Visit: Payer: Self-pay | Admitting: Child and Adolescent Psychiatry

## 2024-05-11 DIAGNOSIS — F3181 Bipolar II disorder: Secondary | ICD-10-CM

## 2024-05-25 ENCOUNTER — Ambulatory Visit: Payer: MEDICAID | Admitting: Psychiatry

## 2024-06-16 ENCOUNTER — Other Ambulatory Visit (HOSPITAL_COMMUNITY): Payer: Self-pay | Admitting: Psychiatry

## 2024-06-16 ENCOUNTER — Telehealth: Payer: Self-pay | Admitting: Child and Adolescent Psychiatry

## 2024-06-16 DIAGNOSIS — F3181 Bipolar II disorder: Secondary | ICD-10-CM

## 2024-06-16 MED ORDER — QUETIAPINE FUMARATE 100 MG PO TABS: 100.0000 mg | ORAL_TABLET | Freq: Every day | ORAL | 0 refills | Status: DC

## 2024-06-16 NOTE — Telephone Encounter (Signed)
 sent

## 2024-06-17 ENCOUNTER — Ambulatory Visit: Payer: MEDICAID | Admitting: Psychiatry

## 2024-06-29 ENCOUNTER — Ambulatory Visit: Payer: MEDICAID | Admitting: Psychiatry

## 2024-08-03 ENCOUNTER — Other Ambulatory Visit: Payer: Self-pay

## 2024-08-03 ENCOUNTER — Encounter: Payer: Self-pay | Admitting: Psychiatry

## 2024-08-03 ENCOUNTER — Ambulatory Visit (INDEPENDENT_AMBULATORY_CARE_PROVIDER_SITE_OTHER): Payer: MEDICAID | Admitting: Psychiatry

## 2024-08-03 VITALS — BP 91/69 | HR 90 | Temp 97.5°F | Ht 65.75 in | Wt 190.2 lb

## 2024-08-03 DIAGNOSIS — F411 Generalized anxiety disorder: Secondary | ICD-10-CM

## 2024-08-03 DIAGNOSIS — F3176 Bipolar disorder, in full remission, most recent episode depressed: Secondary | ICD-10-CM | POA: Diagnosis not present

## 2024-08-03 NOTE — Progress Notes (Deleted)
 Patient ID: Victoria Simmons, female   DOB: October 24, 1995, 28 y.o.   MRN: [id number]

## 2024-08-03 NOTE — Progress Notes (Deleted)
   Established Patient Office Visit  Subjective   Patient ID: Victoria Simmons, female    DOB: 1996-03-24  Age: 28 y.o. MRN: [id number]  Chief Complaint  Patient presents with   Establish Care    HPI  {History (Optional):23778}  ROS    Objective:     BP 91/69   Pulse 90   Temp (!) 97.5 F (36.4 C) (Temporal)   Ht 5' 5.75 (1.67 m)   Wt 190 lb 3.2 oz (86.3 kg)   BMI 30.93 kg/m  {Vitals History (Optional):23777}  Physical Exam   No results found for any visits on 08/03/24.  {Labs (Optional):23779}  The ASCVD Risk score (Arnett DK, et al., 2019) failed to calculate for the following reasons:   The 2019 ASCVD risk score is only valid for ages 78 to 53    Assessment & Plan:   Problem List Items Addressed This Visit   None   No follow-ups on file.    Hartwell Vandiver, MD

## 2024-08-03 NOTE — Progress Notes (Signed)
 [facility] MD OP Progress Note  08/03/2024 12:37 PM Victoria Simmons  MRN:  [id number]  Chief Complaint:  Chief Complaint  Patient presents with   Follow-up   Depression   Manic Behavior   Anxiety   Medication Refill   Discussed the use of AI scribe software for clinical note transcription with the patient, who gave verbal consent to proceed.  History of Present Illness Victoria Simmons is a 28 year old Caucasian female, divorced, currently lives with her significant other in [place], employed, has a history of bipolar disorder type II, generalized anxiety disorder was evaluated in office today.  Previously was under the care of Dr. [name] Marek at this practice.  Last visit was on 02/24/2024.  Since the past several months, she has experienced significant improvement in mood and overall well-being, which she relates to her supportive relationship with her girlfriend and consistent medication adherence. She describes feeling the happiest she has ever been and states that her current medications are working well.  She denies any hypomanic or manic symptoms.  Mild social anxiety currently affects her, particularly in situations involving public speaking or large crowds, though she notes that her anxiety is otherwise well managed.  Her current medication regimen includes Luvox  200 mg, Lamictal , trazodone  100 mg, Seroquel  100 mg, and hydroxyzine  as needed.  She states that she takes her medications as prescribed and finds them effective for her symptoms.  She denies any side effects.  She does report some skin picking especially due to nervousness.  She picks on her acne as well as around her nails.  She denies any suicidality, homicidality or perceptual disturbances.  She denies any obsessions or compulsive symptoms.  She does report previously she may have had eating problems this may have been several years ago which lasted for a few months.  She does report episodes of starving herself as well as  making herself throw up after eating.  She currently denies any such concerns and reports she was never diagnosed with an eating disorder previously.  She was under the care of [name] at this practice, last visit may have been in April.  She reports she was released from therapy since she was doing well.   Visit Diagnosis:    ICD-10-CM   1. Bipolar disorder, in full remission, most recent episode depressed  F31.76    Type 2    2. GAD (generalized anxiety disorder)  F41.1       Past Psychiatric History: She has a history of bipolar II disorder with past episodes of hypomania and depression. Previous hypomanic episodes typically lasted about 1 week, characterized by elevated mood, increased energy, decreased need for sleep, excessive talkativeness, impulsive spending, and irritability, followed by depressive episodes lasting 1 to 3 months. Anger previously led to severe conflicts with family members, including physical altercations, but these symptoms have resolved. She has a history of generalized anxiety, which previously involved constant worry and overthinking about various aspects of life. Skin picking, primarily affecting her fingers and face, began as an anxious habit and became more automatic over time, and she often reports bleeding and scarring, especially during breakouts. She previously trialed Zoloft  and Prozac  as a teenager, both more than 10 years ago. She used Abilify  prior to a medication change during a partial hospitalization program in April-May 2024. She has had 2 hospitalizations in the past, this may have been in 2021, [facility].  She completed a partial hospitalization program (PHP) at [facility] in  April-May 2024. She reports a suicide attempt by medication overdose of her psychotropics in approximately 2019; she reports she never talked about this to anyone and slept through it and woke up the next day and felt fine.  Since then her medications were being  managed by her family members.  She has not had further attempts since then.   Past Medical History:  Past Medical History:  Diagnosis Date   Anxiety    Bipolar 2 disorder (HCC)    Depression    Dysmenorrhea    Hypertriglyceridemia 2020   MDD (major depressive disorder) 06/19/2017   Ovarian torsion 2008    Past Surgical History:  Procedure Laterality Date   COLONOSCOPY  08/2020   polyp, int hem, benign biopsies ([facility])   ESOPHAGOGASTRODUODENOSCOPY  08/2020   LA grade A reflux esophagitis and antral gastritis, H pylori neg, treated with PPI BID and carafate  course ([facility]   OVARY SURGERY  06/30/2007   dx laparoscopy, reduction of right adnexal torsion, bilateral oophoropexy of each ovary to posterior uterine fundus   TONSILLECTOMY Bilateral 05/12/2024   Substance abuse history: She reports past cannabis use, stating she used to smoke cannabis and began slowing down around Christmas last year, eventually stopping completely by June. She denies use of other drugs. She reports very rare, social alcohol use. She denies cigarette, vape, or chew tobacco use.    Family Psychiatric History: As noted below.  Family History:  Family History  Problem Relation Age of Onset   Bipolar disorder Mother    Hepatitis B Mother    Anxiety disorder Mother    Hypertension Father    ADD / ADHD Brother    Depression Maternal Grandmother    Anxiety disorder Maternal Grandmother    Ovarian cancer Paternal Grandmother     Social History: She was born and raised in [place]  until the age of 59.  She and her family moved to [place]  when she was around 28 years old.  She was raised by both parents.  She has 1 sister and 1 brother.  She reports she had a good childhood.  She graduated high school did some college.  She was previously married and divorced.  She currently lives with her significant other and recently moved to [place], [place].  She currently works at a daycare.  She denies having  children.  She does not own a gun.  She denies legal problems.  She reports her significant other is supportive. Social History   Socioeconomic History   Marital status: Significant Other    Spouse name: Not on file   Number of children: 0   Years of education: Not on file   Highest education level: High school graduate  Occupational History   Occupation: [employer]    Comment: full time  Tobacco Use   Smoking status: Never    Passive exposure: Past   Smokeless tobacco: Never  Vaping Use   Vaping status: Former  Substance and Sexual Activity   Alcohol use: Yes    Comment: Occasionally   Drug use: Not Currently    Types: Marijuana    Comment: last use june 2025   Sexual activity: Yes  Other Topics Concern   Not on file  Social History Narrative          Social Drivers of Health   Financial Resource Strain: Medium Risk (04/16/2024)   Received from [facility]   Overall Financial Resource Strain (CARDIA)    Difficulty of Paying  Living Expenses: Somewhat hard  Food Insecurity: Food Insecurity Present (04/16/2024)   Received from [facility]   Hunger Vital Sign    Within the past 12 months, you worried that your food would run out before you got the money to buy more.: Sometimes true    Within the past 12 months, the food you bought just didn't last and you didn't have money to get more.: Never true  Transportation Needs: No Transportation Needs (04/16/2024)   Received from [facility] - Transportation    Lack of Transportation (Medical): No    Lack of Transportation (Non-Medical): No  Physical Activity: Insufficiently Active (04/16/2024)   Received from [facility]   Exercise Vital Sign    On average, how many days per week do you engage in moderate to strenuous exercise (like a brisk walk)?: 2 days    On average, how many minutes do you engage in exercise at this level?: 60 min  Stress: No Stress Concern Present (04/16/2024)   Received from  [facility] - Occupational Stress Questionnaire    Feeling of Stress : Only a little  Social Connections: Socially Integrated (04/16/2024)   Received from [facility]   Social Network    How would you rate your social network (family, work, friends)?: Good participation with social networks    Allergies: No Known Allergies  Metabolic Disorder Labs: Lab Results  Component Value Date   HGBA1C 5.0 07/26/2020   MPG 97 07/26/2020   MPG 94 05/14/2017   No results found for: PROLACTIN Lab Results  Component Value Date   CHOL 206 (H) 06/18/2023   TRIG 193.0 (H) 06/18/2023   HDL 42.20 06/18/2023   CHOLHDL 5 06/18/2023   VLDL 38.6 06/18/2023   LDLCALC 125 (H) 06/18/2023   LDLCALC 154 (H) 07/26/2020   Lab Results  Component Value Date   TSH 4.67 06/18/2023   TSH 2.70 09/12/2021    Therapeutic Level Labs: No results found for: LITHIUM No results found for: VALPROATE No results found for: CBMZ  Current Medications: Current Outpatient Medications  Medication Sig Dispense Refill   fluvoxaMINE  (LUVOX ) 100 MG tablet Take 2 tablets (200 mg total) by mouth at bedtime. 180 tablet 1   hydrOXYzine  (ATARAX ) 10 MG tablet Take 10 mg by mouth 3 (three) times daily as needed.     lamoTRIgine  (LAMICTAL ) 100 MG tablet TAKE 1&1/2 TABLETS (150 MG TOTAL) BY MOUTH AT BEDTIME. 135 tablet 1   ondansetron  (ZOFRAN -ODT) 4 MG disintegrating tablet Take 1 tablet (4 mg total) by mouth every 8 (eight) hours as needed for nausea or vomiting. 20 tablet 0   QUEtiapine  (SEROQUEL ) 100 MG tablet Take 1 tablet (100 mg total) by mouth at bedtime. 90 tablet 0   traZODone  (DESYREL ) 100 MG tablet TAKE 1 TABLET BY MOUTH EVERYDAY AT BEDTIME 90 tablet 1   triamcinolone  (KENALOG ) 0.1 % paste Use as directed 1 Application in the mouth or throat 2 (two) times daily. 5 g 0   No current facility-administered medications for this visit.     Musculoskeletal: Strength &  Muscle Tone: within normal limits Gait & Station: normal Patient leans: N/A  Psychiatric Specialty Exam: Review of Systems  Psychiatric/Behavioral: Negative.      Blood pressure 91/69, pulse 90, temperature (!) 97.5 F (36.4 C), temperature source Temporal, height 5' 5.75 (1.67 m), weight 190 lb 3.2 oz (86.3 kg).Body mass index is 30.93 kg/m.  General Appearance: Casual  Eye Contact:  Fair  Speech:  Clear and Coherent  Volume:  Normal  Mood:  Euthymic  Affect:  Congruent  Thought Process:  Goal Directed and Descriptions of Associations: Intact  Orientation:  Full (Time, Place, and Person)  Thought Content: Logical   Suicidal Thoughts:  No  Homicidal Thoughts:  No  Memory:  Immediate;   Fair Recent;   Fair Remote;   Fair  Judgement:  Fair  Insight:  Fair  Psychomotor Activity:  Normal  Concentration:  Concentration: Fair and Attention Span: Fair  Recall:  [employer] of Knowledge: Fair  Language: Fair  Akathisia:  No  Handed:  Right  AIMS (if indicated): done  Assets:  Communication Skills Desire for Improvement Housing Intimacy Resilience Social Support Talents/Skills Transportation  ADL's:  Intact  Cognition: WNL  Sleep:  Fair   Screenings: AIMS    Flowsheet Row Office Visit from 08/03/2024 in [facility] (Discharged) from 05/13/2017 in [facility]  AIMS Total Score 0 0   AUDIT    Flowsheet Row Counselor from 09/17/2023 in [facility] from 05/13/2017 in [facility]  Alcohol Use Disorder Identification Test Final Score (AUDIT) 6 6 0   GAD-7    Flowsheet Row Office Visit from 08/03/2024 in [facility] Counselor from 12/24/2023 in [facility] Counselor from 09/17/2023 in [facility] Office Visit from 09/13/2023 in [facility] [place] Office Visit from 06/25/2023 in [facility]  Total GAD-7 Score 0 2 4 6 2    PHQ2-9    Flowsheet Row Office Visit from 08/03/2024 in [facility] Counselor from 12/24/2023 in [facility] Counselor from 09/17/2023 in [facility] Office Visit from 09/13/2023 in [facility] Office Visit from 06/25/2023 in [facility] [place]  PHQ-2 Total Score 0 0 1 0 0  PHQ-9 Total Score -- 1 7 6 3    Flowsheet Row Office Visit from 08/03/2024 in [facility] from 12/09/2023 in [facility] Emergency Department at [facility] from 10/22/2023 in [facility])  C-SSRS RISK CATEGORY Moderate Risk No Risk No Risk     Assessment and Plan: Victoria Simmons is a 28 year old Caucasian female who presented for a follow-up appointment this being this patient's first visit with this provider, previously under the care of Dr.Umrania at this practice.  Discussed assessment and plan as noted below.  Assessment & Plan Bipolar II disorder in remission most recent episode  She experiences mood instability with hypomanic episodes lasting about a week and depressive episodes lasting up to three months. There is a history of suicidal ideation and a suicide attempt in 2019. She wishes to taper off medications but is concerned about potential decompensation.  Discuss potential future tapering of Seroquel  with gradual reduction and monitoring. Request recent lab results from urgent care .Discuss the pregnancy implication of being on these kind of medications. Continue  Seroquel  100 mg at bedtime. Continue Lamotrigine  150 mg daily Continue Trazodone  100 mg at bedtime  Generalized anxiety disorder-stable Currently reports anxiety is overall good.  Does report anxiety in social  situations although she is able to work and interact with others.  She was previously in psychotherapy which helped. Continue Luvox  200 mg daily Continue Hydroxyzine  10 mg 3 times a day as needed  I have reviewed EKG dated 12/09/2023-QTc-417, sinus tachycardia.  Patient advised to sign a release to obtain medical records including most recent labs from her provider, most recent visit to the urgent care.  Will consider repeating labs including lipid panel, prolactin level, hemoglobin A1c, TSH, sodium, platelet count, LFT, BUN/creatinine in the future.  Follow-up Follow-up in clinic in 3 months or sooner if needed.  Collaboration of Care: Collaboration of Care: Other I have reviewed notes per Dr.Umrania dated 02/24/2024-most recent visit patient was diagnosed with bipolar disorder, generalized anxiety disorder and continued on her current medication regimen.  I have also reviewed notes per [name] most recent visit 01/31/2024 she was successfully discharged from therapy.  Patient/Guardian was advised Release of Information must be obtained prior to any record release in order to collaborate their care with an outside provider. Patient/Guardian was advised if they have not already done so to contact the registration department to sign all necessary forms in order for us  to release information regarding their care.   Consent: Patient/Guardian gives verbal consent for treatment and assignment of benefits for services provided during this visit. Patient/Guardian expressed understanding and agreed to proceed.   I have spent atleast 40 minutes face to face with patient today which includes the time spent for preparing to see the patient ( e.g., review of test, records ), obtaining and to  review and separately obtained history , ordering medications and test ,psychoeducation and supportive psychotherapy and care coordination,as well as documenting clinical information in electronic health record.   This note was generated in part or whole with voice recognition software. Voice recognition is usually quite accurate but there are transcription errors that can and very often do occur. I apologize for any typographical errors that were not detected and corrected.    Judiann Celia, MD 08/03/2024, 12:37 PM

## 2024-08-07 ENCOUNTER — Other Ambulatory Visit: Payer: Self-pay | Admitting: Child and Adolescent Psychiatry

## 2024-08-07 DIAGNOSIS — F411 Generalized anxiety disorder: Secondary | ICD-10-CM

## 2024-08-07 NOTE — Telephone Encounter (Signed)
 Dr. Elvera Maria pt

## 2024-08-09 ENCOUNTER — Telehealth: Payer: Self-pay | Admitting: Psychiatry

## 2024-08-09 NOTE — Telephone Encounter (Signed)
 Patient currently lives in [place], [place] , will have CMA contact patient to verify pharmacy prior to sending in refill for Luvox .

## 2024-08-10 ENCOUNTER — Other Ambulatory Visit (HOSPITAL_COMMUNITY): Payer: Self-pay | Admitting: Psychiatry

## 2024-08-10 DIAGNOSIS — F3181 Bipolar II disorder: Secondary | ICD-10-CM

## 2024-08-10 NOTE — Telephone Encounter (Signed)
 Please try again or send a MyChart message.

## 2024-08-10 NOTE — Telephone Encounter (Signed)
Called patient to verify pharmacy no answer left voicemail for patient to return call to office

## 2024-09-24 ENCOUNTER — Other Ambulatory Visit: Payer: Self-pay | Admitting: Family Medicine

## 2024-09-28 ENCOUNTER — Other Ambulatory Visit (HOSPITAL_COMMUNITY): Payer: Self-pay | Admitting: Psychiatry

## 2024-09-28 DIAGNOSIS — F3181 Bipolar II disorder: Secondary | ICD-10-CM

## 2024-11-04 ENCOUNTER — Telehealth: Payer: MEDICAID | Admitting: Psychiatry

## 2024-11-04 ENCOUNTER — Encounter: Payer: Self-pay | Admitting: Psychiatry

## 2024-11-04 DIAGNOSIS — Z634 Disappearance and death of family member: Secondary | ICD-10-CM | POA: Diagnosis not present

## 2024-11-04 DIAGNOSIS — F3176 Bipolar disorder, in full remission, most recent episode depressed: Secondary | ICD-10-CM

## 2024-11-04 DIAGNOSIS — F411 Generalized anxiety disorder: Secondary | ICD-10-CM

## 2024-11-04 NOTE — Progress Notes (Signed)
 Virtual Visit via Video Note  I connected with Victoria Simmons on 11/04/24 at  8:30 AM EST by a video enabled telemedicine application and verified that I am speaking with the correct person using two identifiers.  Location Provider Location : [facility] Patient Location : Home  Participants: Patient , Provider   I discussed the limitations of evaluation and management by telemedicine and the availability of in person appointments. The patient expressed understanding and agreed to proceed.   I discussed the assessment and treatment plan with the patient. The patient was provided an opportunity to ask questions and all were answered. The patient agreed with the plan and demonstrated an understanding of the instructions.   The patient was advised to call back or seek an in-person evaluation if the symptoms worsen or if the condition fails to improve as anticipated.   BH MD OP Progress Note  11/04/2024 8:57 AM Victoria Simmons  MRN:  [id number]  Chief Complaint:  Chief Complaint  Patient presents with   Follow-up   Depression   Medication Refill   Anxiety   Discussed the use of AI scribe software for clinical note transcription with the patient, who gave verbal consent to proceed.  History of Present Illness Victoria Simmons is a 29 year old Caucasian female, divorced, currently lives with her significant other in [place], unemployed, has a history of bipolar disorder type II, generalized anxiety disorder was evaluated by telemedicine today.  She reports that her mood and anxiety symptoms have remained stable since her last visit in October. She describes feeling generally well, with no significant changes in her symptoms. Following the recent passing of her grandfather three weeks ago, she notes experiencing sadness and a sense of uncertainty, but these feelings fluctuate and she finds support by talking to her girlfriend when needed. She does not currently see a therapist and does not feel  the need for one at this time.  She denies any thoughts of harming herself or others. She denies perceptual disturbances, including auditory hallucinations and paranoia.  She reports that her sleep remains good and she generally maintains her appetite, though she notes a slight decrease in appetite without associated weight loss. She includes regular walking in her physical activity except during colder weather, and she stays engaged with daily activities, including caring for her girlfriend's nephew.  She continues her current medication regimen, which includes Seroquel  100 mg, lamotrigine  150 mg, Luvox  200 mg, hydroxyzine  10 mg three times daily, and trazodone  100 mg at bedtime.   She denies any suicidality, homicidality or perceptual disturbances.    Visit Diagnosis:    ICD-10-CM   1. Bipolar disorder, in full remission, most recent episode depressed  F31.76    Type 2    2. GAD (generalized anxiety disorder)  F41.1     3. Bereavement  Z63.4       Past Psychiatric History: I have reviewed past psychiatric history from progress note on 08/03/2024.  Past Medical History:  Past Medical History:  Diagnosis Date   Anxiety    Bipolar 2 disorder (HCC)    Depression    Dysmenorrhea    Hypertriglyceridemia 2020   MDD (major depressive disorder) 06/19/2017   Ovarian torsion 2008    Past Surgical History:  Procedure Laterality Date   COLONOSCOPY  08/2020   polyp, int hem, benign biopsies ([facility])   ESOPHAGOGASTRODUODENOSCOPY  08/2020   LA grade A reflux esophagitis and antral gastritis, H pylori neg, treated with PPI BID and carafate  course (  [facility]   OVARY SURGERY  06/30/2007   dx laparoscopy, reduction of right adnexal torsion, bilateral oophoropexy of each ovary to posterior uterine fundus   TONSILLECTOMY Bilateral 05/12/2024    Family Psychiatric History: I have reviewed family psychiatric history from progress note on 08/03/2024.  Family History:  Family History   Problem Relation Age of Onset   Bipolar disorder Mother    Hepatitis B Mother    Anxiety disorder Mother    Hypertension Father    ADD / ADHD Brother    Depression Maternal Grandmother    Anxiety disorder Maternal Grandmother    Ovarian cancer Paternal Grandmother     Social History: I have reviewed social history from progress note on 08/03/2024. Social History   Socioeconomic History   Marital status: Significant Other    Spouse name: Not on file   Number of children: 0   Years of education: Not on file   Highest education level: High school graduate  Occupational History   Occupation: [employer]    Comment: full time  Tobacco Use   Smoking status: Never    Passive exposure: Past   Smokeless tobacco: Never  Vaping Use   Vaping status: Former  Substance and Sexual Activity   Alcohol use: Yes    Comment: Occasionally   Drug use: Not Currently    Types: Marijuana    Comment: last use june 2025   Sexual activity: Yes  Other Topics Concern   Not on file  Social History Narrative          Social Drivers of Health   Tobacco Use: Low Risk (11/04/2024)   Patient History    Smoking Tobacco Use: Never    Smokeless Tobacco Use: Never    Passive Exposure: Past  Financial Resource Strain: Medium Risk (04/16/2024)   Received from [facility]   Overall Financial Resource Strain (CARDIA)    Difficulty of Paying Living Expenses: Somewhat hard  Food Insecurity: Food Insecurity Present (04/16/2024)   Received [facility]   Epic    Within the past 12 months, you worried that your food would run out before you got the money to buy more.: Sometimes true    Within the past 12 months, the food you bought just didn't last and you didn't have money to get more.: Never true  Transportation Needs: No Transportation Needs (04/16/2024)   Received from [facility] - Transportation    Lack of Transportation (Medical): No    Lack of Transportation  (Non-Medical): No  Physical Activity: Insufficiently Active (04/16/2024)   Received from [facility]   Exercise Vital Sign    On average, how many days per week do you engage in moderate to strenuous exercise (like a brisk walk)?: 2 days    On average, how many minutes do you engage in exercise at this level?: 60 min  Stress: No Stress Concern Present (04/16/2024)   Received from [facility] - Occupational Stress Questionnaire    Feeling of Stress : Only a little  Social Connections: Socially Integrated (04/16/2024)   Received from [facility]   Social Network    How would you rate your social network (family, work, friends)?: Good participation with social networks  Depression (PHQ2-9): Low Risk (08/03/2024)   Depression (PHQ2-9)    PHQ-2 Score: 0  Alcohol Screen: Low Risk (09/17/2023)   Alcohol Screen    Last Alcohol Screening Score (AUDIT): 6  Housing: Low Risk (04/16/2024)   Received from [facility]    In the last 12 months, was there a time when you were not able to pay the mortgage or rent on time?: No    In the past 12 months, how many times have you moved where you were living?: 1    At any time in the past 12 months, were you homeless or living in a shelter (including now)?: No  Utilities: Not At Risk (04/16/2024)   Received from [facility] Utilities    Threatened with loss of utilities: No  Health Literacy: Adequate Health Literacy (09/17/2023)   B1300 Health Literacy    Frequency of need for help with medical instructions: Never    Allergies: Allergies[1]  Metabolic Disorder Labs: Lab Results  Component Value Date   HGBA1C 5.0 07/26/2020   MPG 97 07/26/2020   MPG 94 05/14/2017   No results found for: PROLACTIN Lab Results  Component Value Date   CHOL 206 (H) 06/18/2023   TRIG 193.0 (H) 06/18/2023   HDL 42.20 06/18/2023   CHOLHDL 5 06/18/2023   VLDL 38.6 06/18/2023   LDLCALC 125 (H)  06/18/2023   LDLCALC 154 (H) 07/26/2020   Lab Results  Component Value Date   TSH 4.67 06/18/2023   TSH 2.70 09/12/2021    Therapeutic Level Labs: No results found for: LITHIUM No results found for: VALPROATE No results found for: CBMZ  Current Medications: Current Outpatient Medications  Medication Sig Dispense Refill   fluvoxaMINE  (LUVOX ) 100 MG tablet TAKE 2 TABLETS BY MOUTH AT BEDTIME. 180 tablet 1   hydrOXYzine  (ATARAX ) 10 MG tablet Take 10 mg by mouth 3 (three) times daily as needed.     lamoTRIgine  (LAMICTAL ) 100 MG tablet TAKE 1&1/2 TABLETS (150 MG TOTAL) BY MOUTH AT BEDTIME. 135 tablet 1   ondansetron  (ZOFRAN -ODT) 4 MG disintegrating tablet Take 1 tablet (4 mg total) by mouth every 8 (eight) hours as needed for nausea or vomiting. 20 tablet 0   QUEtiapine  (SEROQUEL ) 100 MG tablet TAKE 1 TABLET BY MOUTH EVERYDAY AT BEDTIME 90 tablet 0   traZODone  (DESYREL ) 100 MG tablet TAKE 1 TABLET BY MOUTH EVERYDAY AT BEDTIME 90 tablet 1   triamcinolone  (KENALOG ) 0.1 % paste Use as directed 1 Application in the mouth or throat 2 (two) times daily. 5 g 0   No current facility-administered medications for this visit.     Musculoskeletal: Strength & Muscle Tone: UTA Gait & Station: Seated Patient leans: N/A  Psychiatric Specialty Exam: Review of Systems  Psychiatric/Behavioral:         Grieving    There were no vitals taken for this visit.There is no height or weight on file to calculate BMI.  General Appearance: Casual  Eye Contact:  Fair  Speech:  Normal Rate  Volume:  Normal  Mood:  grieving  Affect:  Congruent  Thought Process:  Goal Directed and Descriptions of Associations: Intact  Orientation:  Full (Time, Place, and Person)  Thought Content: Logical   Suicidal Thoughts:  No  Homicidal Thoughts:  No  Memory:  Immediate;   Fair Recent;   Fair Remote;   Fair  Judgement:  Fair  Insight:  Fair  Psychomotor Activity:  Normal  Concentration:  Concentration: Fair  and Attention Span: Fair  Recall:  [employer] of Knowledge: Fair  Language: Fair  Akathisia:  No  Handed:  Right  AIMS (if indicated): not done  Assets:  Communication Skills Desire for Improvement Housing Social Support Transportation  ADL's:  Intact  Cognition: WNL  Sleep:  Fair   Screenings: AIMS    Flowsheet Row Office Visit from 08/03/2024 in [facility] Admission (Discharged) from 05/13/2017 in [facility]INE  AIMS Total Score 0 0   AUDIT    Flowsheet Row Counselor from 09/17/2023 in [facility]Dis[facility] from 05/13/2017 in [facility]PAT[facility]AL [facility]ICINE  Alcohol Use Disorder Identification Test Final Score (AUDIT) 6 6 0   GAD-7    Flowsheet Row Office Visit from 08/03/2024 in [facility] Counselor from 12/24/2023 in [facility] Counselor from 09/17/2023 in [facility] Office Visit from 09/13/2023 in [facility] Office Visit from 06/25/2023 in [facility]  Total GAD-7 Score 0 2 4 6 2    PHQ2-9    Flowsheet Row Office Visit from 08/03/2024 in [facility] Counselor from 12/24/2023 in [facility] Associates Counselor from 09/17/2023 in [facility] Office Visit from 09/13/2023 in [facility] Office Visit from 06/25/2023 in [facility]  PHQ-2 Total Score 0 0 1 0 0  PHQ-9 Total Score -- 1 7 6 3    Flowsheet Row Video Visit from 11/04/2024 in [facility] Office Visit from  08/03/2024 in [facility] ED from 12/09/2023 in [facility] Emergency Department at [facility]  C-SSRS RISK CATEGORY Low Risk Moderate Risk No Risk     Assessment and Plan: Alexica BELISSA KOOY is a 29 year old Caucasian female who presented for a follow-up appointment, discussed assessment and plan as noted below.  1. Bipolar 2 disorder, major depressive episode (HCC) in remission Currently reports overall mood symptoms are stable on the current medication regimen Continue Seroquel  100 mg at bedtime Continue Lamotrigine  150 mg daily Continue Trazodone  100 mg at bedtime  2. GAD (generalized anxiety disorder)-stable Currently denies any significant anxiety symptoms Continue Luvox  200 mg daily Continue Hydroxyzine  10 mg 3 times a day as needed  3. Bereavement-improving Reports currently coping with grief well and has good support system Discussed referral for grief counseling, patient declines Continue current medication regimen as noted above.  Discussed with patient the need for labs including TSH, lipid panel, prolactin, hemoglobin A1c, sodium, platelet count, LFT, BUNs/creatinine.  Patient agrees to get this completed at her primary care provider's office.  If anything pending will consider repeating when she returns for next visit.  Follow-up Follow-up in clinic in 3 to 4 months or sooner in person.   Collaboration of Care: Collaboration of Care: Primary Care Provider AEB patient encouraged to schedule an appointment with primary care provider.  Patient/Guardian was advised Release of Information must be obtained prior to any record release in order to collaborate their care with an outside provider. Patient/Guardian was advised if they have not already done so to contact the registration department to sign all necessary forms in order for us  to release information regarding their care.   Consent: Patient/Guardian gives verbal consent for  treatment and assignment of benefits for services provided during this visit. Patient/Guardian expressed understanding and agreed to proceed.   This note was generated  in part or whole with voice recognition software. Voice recognition is usually quite accurate but there are transcription errors that can and very often do occur. I apologize for any typographical errors that were not detected and corrected.    Shaquoia Miers, MD 11/04/2024, 8:57 AM     [1] No Known Allergies

## 2024-11-06 ENCOUNTER — Other Ambulatory Visit: Payer: Self-pay | Admitting: Child and Adolescent Psychiatry

## 2024-11-06 NOTE — Telephone Encounter (Signed)
 Dr. Elvera Maria pt

## 2025-03-08 ENCOUNTER — Ambulatory Visit: Payer: MEDICAID | Admitting: Psychiatry
